# Patient Record
Sex: Female | Born: 1935
Health system: Southern US, Community
[De-identification: ages and names within clinical notes are randomized; demographics above are authoritative.]

## PROBLEM LIST (undated history)

## (undated) DIAGNOSIS — I519 Heart disease, unspecified: Secondary | ICD-10-CM

## (undated) DIAGNOSIS — C801 Malignant (primary) neoplasm, unspecified: Secondary | ICD-10-CM

## (undated) DIAGNOSIS — E785 Hyperlipidemia, unspecified: Secondary | ICD-10-CM

## (undated) DIAGNOSIS — J45909 Unspecified asthma, uncomplicated: Secondary | ICD-10-CM

## (undated) DIAGNOSIS — M353 Polymyalgia rheumatica: Secondary | ICD-10-CM

## (undated) DIAGNOSIS — Z9221 Personal history of antineoplastic chemotherapy: Secondary | ICD-10-CM

## (undated) DIAGNOSIS — C50919 Malignant neoplasm of unspecified site of unspecified female breast: Secondary | ICD-10-CM

## (undated) DIAGNOSIS — Z923 Personal history of irradiation: Secondary | ICD-10-CM

## (undated) DIAGNOSIS — I10 Essential (primary) hypertension: Secondary | ICD-10-CM

## (undated) DIAGNOSIS — K219 Gastro-esophageal reflux disease without esophagitis: Secondary | ICD-10-CM

## (undated) DIAGNOSIS — D649 Anemia, unspecified: Secondary | ICD-10-CM

## (undated) DIAGNOSIS — T7840XA Allergy, unspecified, initial encounter: Secondary | ICD-10-CM

## (undated) DIAGNOSIS — U099 Post covid-19 condition, unspecified: Secondary | ICD-10-CM

## (undated) HISTORY — DX: Post covid-19 condition, unspecified: U09.9

## (undated) HISTORY — DX: Gastro-esophageal reflux disease without esophagitis: K21.9

## (undated) HISTORY — DX: Malignant (primary) neoplasm, unspecified: C80.1

## (undated) HISTORY — DX: Allergy, unspecified, initial encounter: T78.40XA

## (undated) HISTORY — DX: Heart disease, unspecified: I51.9

## (undated) HISTORY — DX: Polymyalgia rheumatica: M35.3

## (undated) HISTORY — DX: Essential (primary) hypertension: I10

## (undated) HISTORY — DX: Anemia, unspecified: D64.9

## (undated) HISTORY — DX: Malignant neoplasm of unspecified site of unspecified female breast: C50.919

## (undated) HISTORY — PX: MASTECTOMY: SHX3

## (undated) HISTORY — DX: Unspecified asthma, uncomplicated: J45.909

## (undated) HISTORY — DX: Hyperlipidemia, unspecified: E78.5

---

## 1973-09-21 HISTORY — PX: ABDOMINAL HYSTERECTOMY: SHX81

## 1999-09-22 HISTORY — PX: RECTOCELE REPAIR: SHX761

## 1999-09-22 HISTORY — PX: CYSTOCELE REPAIR: SHX163

## 1999-10-16 ENCOUNTER — Encounter: Payer: Self-pay | Admitting: *Deleted

## 1999-10-20 ENCOUNTER — Inpatient Hospital Stay (HOSPITAL_COMMUNITY): Admission: RE | Admit: 1999-10-20 | Discharge: 1999-10-21 | Payer: Self-pay | Admitting: *Deleted

## 2000-02-09 ENCOUNTER — Encounter: Payer: Self-pay | Admitting: Family Medicine

## 2000-02-09 ENCOUNTER — Encounter: Admission: RE | Admit: 2000-02-09 | Discharge: 2000-02-09 | Payer: Self-pay | Admitting: Family Medicine

## 2000-07-05 ENCOUNTER — Other Ambulatory Visit: Admission: RE | Admit: 2000-07-05 | Discharge: 2000-07-05 | Payer: Self-pay | Admitting: *Deleted

## 2000-10-18 ENCOUNTER — Encounter: Payer: Self-pay | Admitting: Family Medicine

## 2000-10-18 ENCOUNTER — Other Ambulatory Visit: Admission: RE | Admit: 2000-10-18 | Discharge: 2000-10-18 | Payer: Self-pay | Admitting: Family Medicine

## 2000-10-18 ENCOUNTER — Encounter: Admission: RE | Admit: 2000-10-18 | Discharge: 2000-10-18 | Payer: Self-pay | Admitting: Family Medicine

## 2000-11-01 ENCOUNTER — Inpatient Hospital Stay (HOSPITAL_COMMUNITY): Admission: RE | Admit: 2000-11-01 | Discharge: 2000-11-05 | Payer: Self-pay | Admitting: General Surgery

## 2000-11-01 ENCOUNTER — Encounter (INDEPENDENT_AMBULATORY_CARE_PROVIDER_SITE_OTHER): Payer: Self-pay | Admitting: *Deleted

## 2000-11-01 ENCOUNTER — Encounter: Payer: Self-pay | Admitting: General Surgery

## 2000-11-01 DIAGNOSIS — C801 Malignant (primary) neoplasm, unspecified: Secondary | ICD-10-CM | POA: Insufficient documentation

## 2000-11-01 HISTORY — PX: BREAST SURGERY: SHX581

## 2000-11-01 HISTORY — DX: Malignant (primary) neoplasm, unspecified: C80.1

## 2000-11-18 ENCOUNTER — Encounter: Admission: RE | Admit: 2000-11-18 | Discharge: 2001-02-16 | Payer: Self-pay | Admitting: Radiation Oncology

## 2000-12-01 ENCOUNTER — Ambulatory Visit (HOSPITAL_COMMUNITY): Admission: RE | Admit: 2000-12-01 | Discharge: 2000-12-01 | Payer: Self-pay | Admitting: Oncology

## 2000-12-02 ENCOUNTER — Ambulatory Visit (HOSPITAL_BASED_OUTPATIENT_CLINIC_OR_DEPARTMENT_OTHER): Admission: RE | Admit: 2000-12-02 | Discharge: 2000-12-02 | Payer: Self-pay | Admitting: General Surgery

## 2000-12-02 ENCOUNTER — Encounter: Payer: Self-pay | Admitting: General Surgery

## 2001-02-25 ENCOUNTER — Ambulatory Visit: Admission: RE | Admit: 2001-02-25 | Discharge: 2001-04-20 | Payer: Self-pay | Admitting: *Deleted

## 2001-03-31 ENCOUNTER — Encounter: Admission: RE | Admit: 2001-03-31 | Discharge: 2001-03-31 | Payer: Self-pay | Admitting: *Deleted

## 2001-04-21 ENCOUNTER — Ambulatory Visit: Admission: RE | Admit: 2001-04-21 | Discharge: 2001-07-20 | Payer: Self-pay | Admitting: *Deleted

## 2001-04-25 ENCOUNTER — Ambulatory Visit (HOSPITAL_BASED_OUTPATIENT_CLINIC_OR_DEPARTMENT_OTHER): Admission: RE | Admit: 2001-04-25 | Discharge: 2001-04-25 | Payer: Self-pay | Admitting: General Surgery

## 2001-07-04 ENCOUNTER — Other Ambulatory Visit: Admission: RE | Admit: 2001-07-04 | Discharge: 2001-07-04 | Payer: Self-pay | Admitting: *Deleted

## 2001-09-05 ENCOUNTER — Other Ambulatory Visit: Admission: RE | Admit: 2001-09-05 | Discharge: 2001-09-05 | Payer: Self-pay | Admitting: *Deleted

## 2001-10-19 ENCOUNTER — Encounter: Admission: RE | Admit: 2001-10-19 | Discharge: 2001-10-19 | Payer: Self-pay | Admitting: Oncology

## 2001-10-19 ENCOUNTER — Encounter: Payer: Self-pay | Admitting: Oncology

## 2002-02-09 ENCOUNTER — Other Ambulatory Visit: Admission: RE | Admit: 2002-02-09 | Discharge: 2002-02-09 | Payer: Self-pay | Admitting: *Deleted

## 2002-10-20 ENCOUNTER — Encounter: Admission: RE | Admit: 2002-10-20 | Discharge: 2002-10-20 | Payer: Self-pay | Admitting: Oncology

## 2002-10-20 ENCOUNTER — Encounter: Payer: Self-pay | Admitting: Oncology

## 2003-02-13 ENCOUNTER — Other Ambulatory Visit: Admission: RE | Admit: 2003-02-13 | Discharge: 2003-02-13 | Payer: Self-pay | Admitting: *Deleted

## 2003-10-29 ENCOUNTER — Encounter: Admission: RE | Admit: 2003-10-29 | Discharge: 2003-10-29 | Payer: Self-pay | Admitting: Oncology

## 2004-02-14 ENCOUNTER — Other Ambulatory Visit: Admission: RE | Admit: 2004-02-14 | Discharge: 2004-02-14 | Payer: Self-pay | Admitting: *Deleted

## 2004-05-16 ENCOUNTER — Encounter: Admission: RE | Admit: 2004-05-16 | Discharge: 2004-05-16 | Payer: Self-pay | Admitting: Family Medicine

## 2004-09-29 ENCOUNTER — Ambulatory Visit: Payer: Self-pay | Admitting: Oncology

## 2004-10-29 ENCOUNTER — Encounter: Admission: RE | Admit: 2004-10-29 | Discharge: 2004-10-29 | Payer: Self-pay | Admitting: Oncology

## 2005-03-30 ENCOUNTER — Ambulatory Visit: Payer: Self-pay | Admitting: Oncology

## 2005-04-30 ENCOUNTER — Other Ambulatory Visit: Admission: RE | Admit: 2005-04-30 | Discharge: 2005-04-30 | Payer: Self-pay | Admitting: *Deleted

## 2005-09-28 ENCOUNTER — Ambulatory Visit: Payer: Self-pay | Admitting: Oncology

## 2005-09-30 ENCOUNTER — Ambulatory Visit (HOSPITAL_COMMUNITY): Admission: RE | Admit: 2005-09-30 | Discharge: 2005-09-30 | Payer: Self-pay | Admitting: Oncology

## 2005-10-09 ENCOUNTER — Ambulatory Visit: Payer: Self-pay | Admitting: Internal Medicine

## 2005-10-13 ENCOUNTER — Ambulatory Visit: Payer: Self-pay | Admitting: Internal Medicine

## 2005-10-30 ENCOUNTER — Encounter: Admission: RE | Admit: 2005-10-30 | Discharge: 2005-10-30 | Payer: Self-pay | Admitting: Oncology

## 2005-12-29 ENCOUNTER — Ambulatory Visit: Payer: Self-pay | Admitting: Oncology

## 2006-06-24 ENCOUNTER — Other Ambulatory Visit: Admission: RE | Admit: 2006-06-24 | Discharge: 2006-06-24 | Payer: Self-pay | Admitting: *Deleted

## 2006-06-24 ENCOUNTER — Ambulatory Visit: Payer: Self-pay | Admitting: Oncology

## 2006-07-22 ENCOUNTER — Encounter: Admission: RE | Admit: 2006-07-22 | Discharge: 2006-07-22 | Payer: Self-pay | Admitting: Oncology

## 2006-11-01 ENCOUNTER — Encounter: Admission: RE | Admit: 2006-11-01 | Discharge: 2006-11-01 | Payer: Self-pay | Admitting: Oncology

## 2006-12-23 ENCOUNTER — Ambulatory Visit: Payer: Self-pay | Admitting: Oncology

## 2007-07-05 ENCOUNTER — Ambulatory Visit: Payer: Self-pay | Admitting: Oncology

## 2007-07-08 LAB — URINALYSIS, MICROSCOPIC - CHCC
Ketones: NEGATIVE mg/dL
Leukocyte Esterase: NEGATIVE
Nitrite: NEGATIVE
Protein: NEGATIVE mg/dL
pH: 6 (ref 4.6–8.0)

## 2007-07-10 LAB — URINE CULTURE

## 2007-07-11 ENCOUNTER — Other Ambulatory Visit: Admission: RE | Admit: 2007-07-11 | Discharge: 2007-07-11 | Payer: Self-pay | Admitting: *Deleted

## 2007-11-10 ENCOUNTER — Encounter: Admission: RE | Admit: 2007-11-10 | Discharge: 2007-11-10 | Payer: Self-pay | Admitting: Oncology

## 2007-11-16 ENCOUNTER — Encounter: Admission: RE | Admit: 2007-11-16 | Discharge: 2007-11-16 | Payer: Self-pay | Admitting: Oncology

## 2008-01-31 ENCOUNTER — Ambulatory Visit: Payer: Self-pay | Admitting: Oncology

## 2008-07-23 ENCOUNTER — Encounter: Admission: RE | Admit: 2008-07-23 | Discharge: 2008-07-23 | Payer: Self-pay | Admitting: Oncology

## 2008-07-24 ENCOUNTER — Other Ambulatory Visit: Admission: RE | Admit: 2008-07-24 | Discharge: 2008-07-24 | Payer: Self-pay | Admitting: Family Medicine

## 2008-07-31 ENCOUNTER — Ambulatory Visit: Payer: Self-pay | Admitting: Oncology

## 2008-08-11 ENCOUNTER — Emergency Department (HOSPITAL_COMMUNITY): Admission: EM | Admit: 2008-08-11 | Discharge: 2008-08-11 | Payer: Self-pay | Admitting: Emergency Medicine

## 2008-11-16 ENCOUNTER — Encounter: Admission: RE | Admit: 2008-11-16 | Discharge: 2008-11-16 | Payer: Self-pay | Admitting: Oncology

## 2009-01-25 ENCOUNTER — Ambulatory Visit: Payer: Self-pay | Admitting: Oncology

## 2009-08-20 ENCOUNTER — Ambulatory Visit: Payer: Self-pay | Admitting: Oncology

## 2009-11-18 ENCOUNTER — Encounter: Admission: RE | Admit: 2009-11-18 | Discharge: 2009-11-18 | Payer: Self-pay | Admitting: Oncology

## 2009-12-05 ENCOUNTER — Telehealth: Payer: Self-pay | Admitting: Internal Medicine

## 2009-12-09 ENCOUNTER — Ambulatory Visit: Payer: Self-pay | Admitting: Gastroenterology

## 2009-12-09 DIAGNOSIS — R1032 Left lower quadrant pain: Secondary | ICD-10-CM | POA: Insufficient documentation

## 2009-12-09 DIAGNOSIS — E0590 Thyrotoxicosis, unspecified without thyrotoxic crisis or storm: Secondary | ICD-10-CM | POA: Insufficient documentation

## 2009-12-09 DIAGNOSIS — I10 Essential (primary) hypertension: Secondary | ICD-10-CM

## 2009-12-09 DIAGNOSIS — Z853 Personal history of malignant neoplasm of breast: Secondary | ICD-10-CM

## 2009-12-09 DIAGNOSIS — K5900 Constipation, unspecified: Secondary | ICD-10-CM

## 2009-12-09 DIAGNOSIS — R1031 Right lower quadrant pain: Secondary | ICD-10-CM | POA: Insufficient documentation

## 2009-12-11 ENCOUNTER — Ambulatory Visit: Payer: Self-pay | Admitting: Internal Medicine

## 2009-12-31 ENCOUNTER — Ambulatory Visit: Payer: Self-pay | Admitting: Internal Medicine

## 2009-12-31 DIAGNOSIS — R1084 Generalized abdominal pain: Secondary | ICD-10-CM | POA: Insufficient documentation

## 2010-07-29 ENCOUNTER — Ambulatory Visit: Payer: Self-pay | Admitting: Oncology

## 2010-10-11 ENCOUNTER — Encounter: Payer: Self-pay | Admitting: Oncology

## 2010-10-21 NOTE — Procedures (Signed)
Summary: Colonoscopy   Colonoscopy  Procedure date:  10/13/2005  Findings:      Location:  [facility].  Results: Hemorrhoids.      Patient Name: Jodi Valdez, Jodi Valdez MRN:  Procedure Procedures: Colonoscopy CPT: 16109.  Personnel: Endoscopist: Wilhemina Bonito. Marina Goodell, MD.  Referred By: Mardelle Matte, MD.  Exam Location: Exam performed in Outpatient Clinic. Outpatient  Patient Consent: Procedure, Alternatives, Risks and Benefits discussed, consent obtained, from patient. Consent was obtained by the RN.  Indications Symptoms: Constipation Abdominal pain / bloating.  Increased Risk Screening: Personal history of breast cancer.  History  Current Medications: Patient is not currently taking Coumadin.  Pre-Exam Physical: Performed Oct 13, 2005. Cardio-pulmonary exam, Rectal exam, Abdominal exam, Mental status exam WNL.  Comments: Pt. history reviewed/updated, physical exam performed prior to initiation of sedation? yes Exam Exam: Extent of exam reached: Cecum, extent intended: Cecum.  The cecum was identified by appendiceal orifice and IC valve. Patient position: on left side. The Cecum was reached at 3:43 PM. ended at 3:54 PM. Time for Withdrawl: 00:11. Colon retroflexion performed. Images taken. ASA Classification: II. Tolerance: excellent.  Monitoring: Pulse and BP monitoring, Oximetry used. Supplemental O2 given.  Colon Prep Used osmo prep for colon prep. Prep results: excellent.  Sedation Meds: Patient assessed and found to be appropriate for moderate (conscious) sedation. Fentanyl 75 mcg. given IV. Versed 7 mg. given IV.  Findings NORMAL EXAM: Cecum to Rectum.   Assessment  Diagnoses: 455.0: Hemorrhoids, Internal.   Comments: NO POLYPS SEEN Events  Unplanned Interventions: No intervention was required.  Unplanned Events: There were no complications. Plans Disposition: After procedure patient sent to recovery. After recovery patient  sent home.  Comments: RETURN TO THE CARE OF DRS.SHERRILL AND KARAM  This report was created from the original endoscopy report, which was reviewed and signed by the above listed endoscopist.   cc:  Maurie Boettcher, MD      Kathrynn Running, MD      The Patient

## 2010-10-21 NOTE — Assessment & Plan Note (Signed)
Summary: FOLLOWUP abdominal pain ( saw PA)   History of Present Illness Visit Type: Follow-up Visit Primary GI MD: Yancey Flemings MD Primary Provider: Kathrynn Running MD Chief Complaint: abdominal pain has improved, patient saw Amy last visit; patient states that her tongue is coated and mouth is very dry. History of Present Illness:   75 year old female with hypertension, hypothyroidism, hyperlipidemia, and breast cancer status post mastectomy followed by chemoradiation therapy, and TRAM flap reconstruction in 2002. She presents today regarding followup for lower abdominal pain. The patient was seen March 21 for lower abdominal pain of 3 weeks' duration. She was treated empirically for diverticulitis. However, prior colonoscopy in 2007 was normal with no evidence of diverticular disease. She did not improve with antibiotic therapy. Antispasmodics were not particularly helpful. Thus, she underwent a CT scan of the abdomen and pelvis on March 23. This was essentially unremarkable. Stool in the colon noted. She did take a purgative for her gout and was feeling somewhat better. Her problem is certainly no worse. She states that the dull lower abdominal aching is better after meals and after defecation. Her weight is stable. No pain at night when she sleeps. She points along the incision  line of her TRAM flap instruction. She is concerned over serious problem such as cancer.   GI Review of Systems    Reports abdominal pain.     Location of  Abdominal pain: lower abdomen.    Denies acid reflux, belching, bloating, chest pain, dysphagia with liquids, dysphagia with solids, heartburn, loss of appetite, nausea, vomiting, vomiting blood, weight loss, and  weight gain.      Reports constipation.     Denies anal fissure, black tarry stools, change in bowel habit, diarrhea, diverticulosis, fecal incontinence, heme positive stool, hemorrhoids, irritable bowel syndrome, jaundice, light color stool, liver problems,  rectal bleeding, and  rectal pain.    Current Medications (verified): 1)  Levothroid 75 Mcg Tabs (Levothyroxine Sodium) .Marland Kitchen.. 1 By Mouth Once Daily 2)  Benazepril Hcl 20 Mg Tabs (Benazepril Hcl) .Marland Kitchen.. 1 By Mouth Once Daily 3)  Crestor 20 Mg Tabs (Rosuvastatin Calcium) .Marland Kitchen.. 1 By Mouth Once Daily 4)  Amitriptyline Hcl 10 Mg Tabs (Amitriptyline Hcl) .... As Needed 5)  Fish Oil 1000 Mg Caps (Omega-3 Fatty Acids) .Marland Kitchen.. 1 By Mouth Two Times A Day 6)  Calcarb 600 1500 Mg Tabs (Calcium Carbonate) .Marland Kitchen.. 1 By Mouth Two Times A Day 7)  Preservision/lutein  Caps (Multiple Vitamins-Minerals) .Marland Kitchen.. 1 By Mouth Two Times A Day 8)  Vitamin D 1000 Unit  Tabs (Cholecalciferol) .Marland Kitchen.. 1 By Mouth Once Daily  Allergies: 1)  ! Codeine  Past History:  Past Medical History: Reviewed history from 12/09/2009 and no changes required. Breast Cancer2002-S/P MASTECTOMY,CHEMO/RADIATION,TRAM FLAP 2002 Hypertension Urinary Tract Infection  Past Surgical History: Reviewed history from 12/09/2009 and no changes required. Hysterectomy cystocele rectocele Breast-Mastectomy, TRAM FLAP 2002  Family History: Reviewed history from 12/09/2009 and no changes required. No FH of Colon Cancer: Family History of Heart Disease: father  Social History: Reviewed history from 12/09/2009 and no changes required. Patient has never smoked.  Alcohol Use - no Daily Caffeine Use 2 per day Illicit Drug Use - no Patient does not get regular exercise.   Review of Systems       The patient complains of back pain and thirst - excessive.  The patient denies allergy/sinus, anemia, anxiety-new, arthritis/joint pain, blood in urine, breast changes/lumps, change in vision, confusion, cough, coughing up blood, depression-new, fainting, fatigue,  fever, headaches-new, hearing problems, heart murmur, heart rhythm changes, itching, menstrual pain, muscle pains/cramps, night sweats, nosebleeds, pregnancy symptoms, shortness of breath, skin rash,  sleeping problems, sore throat, swelling of feet/legs, swollen lymph glands, thirst - excessive , urination - excessive , urination changes/pain, urine leakage, vision changes, and voice change.    Vital Signs:  Patient profile:   75 year old female Height:      64 inches Weight:      134.50 pounds BMI:     23.17 Pulse rate:   92 / minute Pulse rhythm:   regular BP sitting:   122 / 76  (right arm) Cuff size:   regular  Vitals Entered By: June McMurray CMA Duncan Dull) (December 31, 2009 1:28 PM)  Physical Exam  General:  Well developed, well nourished, no acute distress. Head:  Normocephalic and atraumatic. Eyes:  PERRLA, no icterus. Mouth:  No deformity or lesions Lungs:  Clear throughout to auscultation. Heart:  Regular rate and rhythm; no murmurs, rubs,  or bruits. Abdomen:  Soft, nontender and nondistended. No masses, hepatosplenomegaly or hernias noted. Normal bowel sounds.. They tenderness to palpation along the transverse incision in the lower abdomen Msk:  no deformities Pulses:  Normal pulses noted. Extremities:  no edema Neurologic:  Alert and  oriented x4;  Skin:  no jaundice Psych:  Alert and cooperative. Normal mood and affect.   Impression & Recommendations:  Problem # 1:  ABDOMINAL PAIN -GENERALIZED (ICD-789.07) 3 month history of lower abdominal discomfort as described. Negative colonoscopy in 2007 and negative CT scan last month. Only relevant GI symptom is relative constipation. I suspect that her pain is musculoskeletal and related to adhesions from her prior surgery.  Plan: #1. reassurance #2. Tylenol p.r.n. pain #3. GI followup p.r.n.  Problem # 2:  CONSTIPATION (ICD-564.00) it's possible that constipation may be contributing to her discomfort.  Plan: #1. Prescribe MiraLax 17 g in 8 ounces of water daily. Titrate to need to achieve one to 2 bowel movements daily.  Patient Instructions: 1)  Glycolax 17 grams in 8 oz of water daily. You can buy this over the  counter.   2)  Written instructions given to you with discount coupon. 3)  Take Tylenol as needed pain. 4)  The medication list was reviewed and reconciled.  All changed / newly prescribed medications were explained.  A complete medication list was provided to the patient / caregiver. 5)  Copy: Dr. Kathrynn Running

## 2010-10-21 NOTE — Assessment & Plan Note (Signed)
Summary: abd. pain-Dr.Perry pt   History of Present Illness Visit Type: Initial Visit Primary GI MD: Yancey Flemings MD Primary Provider: Kathrynn Running MD Chief Complaint: abd pain History of Present Illness:   PLEASANT 75 YO FEMALE KNOWN TO DR.PERRY FROM COLONOSCOPYIN 2007. THIS WAS NORMAL EXCEPT FOR INTERNAL HEMORRHOIDS.SHE COMES IN TODAY WITH NEW C/O LOWER ABDOMINAL ACHING PAIN X 3 WEEKS. SHE SAYS HER SXS STARTED AFTER SHE ATE ALOT OF POPCORN,THEN DEVELOPED SOME CONSTIPATION. SINCE THAT TIME SHE HAS BEEN HAVING BMS DAILY BUT FEELS PRESSURE LIKE SHE NEEDS TO EVACUATE BOWELS. SHE HAS HAD MUCOUS IN STOOL, NO BLOOD. NO FEVER. NO N/V,APPETITE GOOD,SOMETIMES FEELS WORSE AFTER EATING -NOT ALWAYS.SHE WAS SEEN BY HER PRIMARY MD AND THOUGHT MAY HAVE DIVERTICULITIS-SHE HAS TAKEN A COURSE OF CIPRO AND FLAGYL BUT HASNT NOTICED ANY REAL IMPROVEMENT IN SXS. SHE IS ALSO ON LIBRAX WHICH HAS HELPED A LITTLE. NO URINARY SXS.  SHE HAS A HX OF BREAST CANCER-S/P MASTECTOMY THEN CHEMO/RADIATION IN 2002. LABS,3/8 WITH CBC,CMET UNREMARKABLE.   GI Review of Systems    Reports abdominal pain and  acid reflux.     Location of  Abdominal pain: lower abdomen.    Denies belching, bloating, chest pain, dysphagia with liquids, dysphagia with solids, heartburn, loss of appetite, nausea, vomiting, vomiting blood, and  weight loss.      Reports change in bowel habits and  constipation.     Denies anal fissure, black tarry stools, diarrhea, diverticulosis, fecal incontinence, heme positive stool, hemorrhoids, irritable bowel syndrome, jaundice, light color stool, liver problems, rectal bleeding, and  rectal pain.    Current Medications (verified): 1)  Metronidazole 500 Mg Tabs (Metronidazole) .Marland Kitchen.. 1 By Mouth Two Times A Day 2)  Cipro 500 Mg Tabs (Ciprofloxacin Hcl) .Marland Kitchen.. 1 By Mouth Two Times A Day X 10 Days 3)  Librax 2.5-5 Mg Caps (Clidinium-Chlordiazepoxide) .Marland Kitchen.. 1 By Mouth Four Times A Day As Needed 4)  Levothroid 75 Mcg Tabs  (Levothyroxine Sodium) .Marland Kitchen.. 1 By Mouth Once Daily 5)  Benazepril Hcl 20 Mg Tabs (Benazepril Hcl) .Marland Kitchen.. 1 By Mouth Once Daily 6)  Crestor 20 Mg Tabs (Rosuvastatin Calcium) .Marland Kitchen.. 1 By Mouth Once Daily 7)  Amitriptyline Hcl 10 Mg Tabs (Amitriptyline Hcl) .... As Needed 8)  Fish Oil 1000 Mg Caps (Omega-3 Fatty Acids) .Marland Kitchen.. 1 By Mouth Two Times A Day 9)  Calcarb 600 1500 Mg Tabs (Calcium Carbonate) .Marland Kitchen.. 1 By Mouth Two Times A Day 10)  Preservision/lutein  Caps (Multiple Vitamins-Minerals) .Marland Kitchen.. 1 By Mouth Two Times A Day 11)  Vitamin D 1000 Unit  Tabs (Cholecalciferol) .Marland Kitchen.. 1 By Mouth Once Daily  Allergies (verified): 1)  ! * Codien  Past History:  Past Medical History: Breast Cancer2002-S/P MASTECTOMY,CHEMO/RADIATION,TRAM FLAP 2002 Hypertension Urinary Tract Infection  Past Surgical History: Hysterectomy cystocele rectocele Breast-Mastectomy, TRAM FLAP 2002  Family History: No FH of Colon Cancer: Family History of Heart Disease: father  Social History: Patient has never smoked.  Alcohol Use - no Daily Caffeine Use 2 per day Illicit Drug Use - no Patient does not get regular exercise.  Smoking Status:  never Drug Use:  no Does Patient Exercise:  no  Review of Systems  The patient denies allergy/sinus, anemia, anxiety-new, arthritis/joint pain, back pain, blood in urine, breast changes/lumps, change in vision, confusion, cough, coughing up blood, depression-new, fainting, fatigue, fever, headaches-new, hearing problems, heart murmur, heart rhythm changes, itching, menstrual pain, muscle pains/cramps, night sweats, nosebleeds, pregnancy symptoms, shortness of breath, skin rash, sleeping problems, sore  throat, swelling of feet/legs, swollen lymph glands, thirst - excessive , urination - excessive , urination changes/pain, urine leakage, vision changes, and voice change.         ROS OTHERWISE AS IN HPI  Vital Signs:  Patient profile:   75 year old female Height:      64  inches Weight:      139 pounds BMI:     23.95 Pulse rate:   98 / minute Pulse rhythm:   regular BP sitting:   142 / 62  (left arm)  Vitals Entered By: Chales Abrahams CMA Duncan Dull) (December 09, 2009 10:04 AM)  Physical Exam  General:  Well developed, well nourished, no acute distress. Head:  Normocephalic and atraumatic. Eyes:  PERRLA, no icterus. Lungs:  Clear throughout to auscultation. Heart:  Regular rate and rhythm; no murmurs, rubs,  or bruits. Abdomen:  SOFT, MILD TENDERNESS ACROSS LOWER ABDOMEN, NO PALP MASS OR HSM,BS+,TRANSVERSE INCISIONAL SCAR. Rectal:  HEME NEGATIVE Extremities:  No clubbing, cyanosis, edema or deformities noted. Neurologic:  Alert and  oriented x4;  grossly normal neurologically. Psych:  Alert and cooperative. Normal mood and affect.   Impression & Recommendations:  Problem # 1:  ABDOMINAL PAIN, LEFT LOWER QUADRANT (ICD-789.04) Assessment New 75 YO FEMALE WITH 3 WEEK HX OF CONSATNT DULL ACHY ABDOMINAL PAIN,LOWER ABDOMEN-UNRESPONSIVE TO EMPIRIC COURSE OF ANTIBIOTICS AND NO DIVERTICULOSIS NOTED ON COLONOSCOPY 2007. ETIOLOGY UNCLEAR.  CONTINUE LIBRAX AS NEEDED ADD ALIGN ONE DAILY X 2 MONTHS SCHEDULE FOR CT SCAN ABDOMEN AND PELVIS. FOLLOW UP WITH DR. PERRY IN 2 WEEKS.FURTHER PLANS PENDING RESULTS OF CT. Orders: CT Abdomen/Pelvis with Contrast (CT Abd/Pelvis w/con)  Problem # 2:  HYPERTENSION (ICD-401.9) Assessment: Comment Only  Problem # 3:  PERSONAL HX BREAST CANCER (ICD-V10.3) Assessment: Comment Only  Patient Instructions: 1)  Continue the Librax as needed. 2)  Start Align, 1 capsule daily. You can get this at any pharmacy, [facility], [employer].  Coupon provided. 3)  We sceduled the CT at [facility] [place] [address] [place]. 4)  Russella Dar laws explained regarding location of CT. 5)  Contrast and directions provided.  6)  Copy sent to : Kathrynn Running, MD 7)                         Rolm Baptise, MD 8)  Made you an appointment with Dr. Marina Goodell for 12-31-09 at 1:30  PM.  9)  The medication list was reviewed and reconciled.  All changed / newly prescribed medications were explained.  A complete medication list was provided to the patient / caregiver.

## 2010-10-21 NOTE — Consult Note (Signed)
Summary: GI Consult/[facility]  GI Consult/ HealthCare   Imported By: Sherian Rein 01/08/2010 11:31:45  _____________________________________________________________________  External Attachment:    Type:   Image     Comment:   External Document

## 2010-10-21 NOTE — Progress Notes (Signed)
Summary: triage   Phone Note Call from Patient Call back at Home Phone [phone]   Caller: Patient Call For: Marina Goodell Reason for Call: Talk to Nurse Summary of Call: Patient wants to be seen sooner than her appt date 4-14 do to a lot of lower abd pain and reflux. Initial call taken by: Tawni Levy,  December 05, 2009 11:08 AM  Follow-up for Phone Call        Given appt. with PA for 12/09/2009.She will have pcp-Phillip Karem fax records to [facility]. Follow-up by: Teryl Lucy RN,  December 05, 2009 11:19 AM

## 2011-02-06 NOTE — Op Note (Signed)
[place]  Patient:    Jodi Valdez, Jodi Valdez                  MRN: [id number] Proc. Date: 11/01/00 Attending:  Rose Phi. Maple Hudson, M.D.                           Operative Report  PREOPERATIVE DIAGNOSIS:  Lobular carcinoma of the left breast.  POSTOPERATIVE DIAGNOSIS:  Lobular carcinoma of the left breast.  Lymph node metastasis.  OPERATION PERFORMED: 1. Blue dye injection. 2. Left axillary sentinel lymph node biopsy. 3. Left total mastectomy. 4. Left axillary lymph node dissection.  SURGEON:  Rose Phi. Maple Hudson, M.D.  ASSISTANT:  Anselm Pancoast. Zachery Dakins, M.D.  ANESTHESIA:  General.  DESCRIPTION OF PROCEDURE:  Prior to coming to the operating room the patient had 1 mCi of technetium sulfur colloid injected intradermally around the periareolar tissue.  After suitable general anesthesia was induced, the patient was placed in the supine position with both arms extended.  5 cc of Lymphazurin blue was then injected in the subareolar tissue and the breast massaged for five minutes.  We then prepped her from the neck to the pubis and then from table to table for her mastectomy plus reconstruction.  After draping her with careful scanning with the Neoprobe revealed no hot spots in the supraclavicular internal mammary area, but a good hot spot in the left axilla.  A short transverse incision was made in the left axilla with dissection through the subcutaneous tissues to expose the clavipectoral fascia.  We could see a faint hint of blue just deep to that and we incised the fascia and then, using the Neoprobe, identified a very hot and blue lymph node and in addition to it, another hot and blue lymph node.  We excised these and submitted them as sentinel nodes.  There were no other palpable nodes or blue nodes or hot nodes.  While that was being done, an elliptical incorporating the nipple areolar complex and an old biopsy scar was then outlined in order  to do a skin sparing mastectomy.  We then dissected the flaps medially to the sternum and superiorly to the clavicle and laterally to the latissimus dorsi muscle and inferiorly to the inframammary fold at the rectus fascia.  The breast was then removed by dissecting from medial to lateral.  As we dissected along the lateral border of the pectoralis major muscle the sentinel node was reported as positive.  I then incised along the pectoralis major muscle and retracted it and then divided the clavipectoral fascia at the axillary vein which we clearly identified.  By incising along the pectoralis minor we were then able to take the level 1 and level 2 nodes by sweeping all the tissue deep to the minor and inferior to the vein out with the long thoracic and thoracodorsal nerves being identified and preserved and other vessels and nerves being clipped and divided.  Following removal of the specimen, we thoroughly irrigated the field with saline. We had good hemostasis.  Dr. Odis Luster was then going to do a TRAM reconstruction which will be recorded in  separate noted. DD:  11/01/00 TD:  11/01/00 Job: [id number]

## 2011-02-06 NOTE — Op Note (Signed)
[place]. [facility]  Patient:    Jodi Valdez, Jodi Valdez                MRN: [id number] Proc. Date: 11/01/00 Adm. Date:  [id number] Attending:  Janalyn Rouse                           Operative Report  PREOPERATIVE DIAGNOSIS:  Left breast cancer and acquired absence of the left breast.  POSTOPERATIVE DIAGNOSIS:  Left breast cancer and acquired absence of the left breast.  PROCEDURE:  Left breast reconstruction using a contralateral based transverse rectus abdominis myocutaneous flap.  SURGEON:  Teena Irani. Odis Luster, M.D.  ASSISTANT:  Dr. Dan Humphreys  ANESTHESIA:  General.  ESTIMATED BLOOD LOSS:  150 cc  DRAINS:  4 Blakes, 2 abdomen, 2 chest.  CLINICAL NOTE:  A 75 year old woman with the diagnoses of lobular carcinoma invasive.  Mastectomy was recommended.  She was also to undergo a sentinel lymph node biopsy.  She was very interested in breast reconstruction and was seen for counseling.  After a fairly extensive counseling she elected to use her own tissue and have tram flap.  She had a midline abdominal scar infraumbilical from hysterectomy many many years ago which meant that only half of the abdominal tissue could be utilized.  She understood that this might leave her just a little bit smaller than her opposite breast.  She was very accepting of this.  It was not felt that a double muscle flap was warranted due to the fairly significant untoward effects it can have on overall abdominal strength.  Next, the procedures, and risks were discussed with her extensively.  It was also felt that she had a little bit more adipose on the left-hand side of the abdomen than the right, and a contralateral muscle flap was planned for that reason.  DESCRIPTION OF PROCEDURE:  The patient was already in the operating room and had undergone sentinel lymph node biopsy and had undergone a modified radical mastectomy.  She had tolerated this well.  The incision was  made around the umbilicus and the dissection carried through the subcutaneous tissue leaving a fairly generous stalk on the umbilicus in order to preserve blood supply to it.  The upper limb of the planned elliptical incision was then made and beveling in a cephalad direction in order to capture perforators, the dissection was carried down to the underlying rectus fascia.  The underlying rectus fascia and muscles were protected and the dissection continued in a cephalad direction connecting through-and-through the mastectomy pocket.  All of this directly on top of the rectus fascia again taking great care to avoid damage to the underlying rectus fascia.  The patient was then placed in a slight sitting position just to make sure that the lower limb of the planned incision could be utilized and indeed this was the case.  She was then returned supine and the lower limb of the incision was then made again, taking the dissection down through the subcutaneous tissue using electrocautery.  Careful inspection was then made of the two sides of the abdomen and indeed the right side seemed to have more generous fat pad and this would be a contralateral flap, that being the case, and it was elected to use the right side.  The flap was then elevated off the left side, on top of the fascia over to the midline using electrocautery.  From the right side it was  elevated up to the lateral row of perforators.  Parallel incisions were then made in the anterior rectus fascia leaving a 2 cm wide strip anteriorly on the rectus muscle.  The rectus muscle was then gently dissected free from its fascial attachments using the bipolar and using a scalpel in areas of the tendinous inscriptions.  Great care was taken to avoid any damage to the underlying intra-abdominal contents as the muscle was mobilized and great care taken to avoid damage to the underlying superior epigastric pedicle.  Distally the deep inferior  perigastric vessels were identified and this having been done the rectus muscle was mobilized again taking great care to avoid any damage to underlying intra-abdominal contents.  Rectus muscle was then divided distally using electrocautery.  The flap was found to be demarcated and at this point lateral to the midline scar and this was then amputated at the midline scar.  This flap would consist only of zone 1 and zone 2.  Attention was then directed to the left chest.  Thorough irrigation with saline and meticulous hemostasis with the electrocautery.  Blake drains were positioned, brought through separate stab wounds laterally and inferiorly and secured with 3-0 Prolene sutures.  One blake drain was placed at the axillae and the other in the mastectomy defect.  Attention then returned to the abdomen.  The rectus muscle looked very healthy.  The deep inferior gastric vessels identified and the artery mobilized first and then triple ligated proximally and double ligated distally.  Veins were then double ligated and divided as well.  The muscle flap was then gently elevated and again was passed through the subcutaneous tunnel that had been previously created onto the left chest where it was positioned and temporarily secured with skin staples without any tension whatsoever.  Again, the flap continued to have excellent color and capillary refill with bright red bleeding at the periphery.  The flap was covered with dry sterile towels.  Attention directed back to the abdomen.  Thorough irrigation with saline, meticulous hemostasis with the electrocautery.  The abdominal closure with 0 Prolene interrupted figure-of-eight sutures taking great care to avoid any damage to any underlying intra-abdominal contents.  The area was then thoroughly irrigated with saline and excellent hemostasis having been confirmed [place] mesh was then positioned ______ through the appropriate point in the mesh through  a opening in the mesh and then the mesh secured with a couple of 0 Prolene horizontal mattress sutures well out of the periphery on each side and then a 2-0 Prolene simple running suture around the edge of the  mesh.  Again, excellent hemostasis having been confirmed, Blake drains were positioned, one on each side, brought out through separate stab wounds inferiorly and secured with 3-0 Prolene sutures.  The patient was then placed in a semi-Fowlers position but was rolled back into Trendelenburg in order to keep her in an appropriate position from an anesthesia standpoint.  The abdominal closure with 2-0 Vicryl interrupted inverted deep sutures, occasional 3-0 Vicryl interrupted inverted deep sutures, the abdominal wall appeared to have very good color and capillary refill at 2-3 seconds.  The umbilicus was then brought up to the appropriate level as close to the midline as possible without placing the umbilicus on too much tension.  The umbilicus had excellent color and excellent capillary refill.  Umbilical insetting 3-0 Vicryl interrupted inverted deep sutures and 5-0 nylon simple interrupted sutures.  Attention was then directed back to the chest.  Again, good hemostasis was  noted.  The very tip of Zone 2 was amputated and there was nice bright red bleeding from that area.  The flap was then marked for de-epithelialization. It was inset along its inferior border using 3-0 Vicryl interrupted inverted deep sutures.  The area for de-epithelialization was de-epithelialized and stitching sutures then placed superiorly and medially using horizontal mattress sutures taking great care to avoid any tension on the flap.  Again, the flap had excellent color and there was bright red bleeding from those areas where it had been de-epithelialized consistent with viability. The remainder of the insetting with 3-0 Vicryl interrupted inverted deep sutures, running 3-0 Vicryl subcuticular  suture.  The axillary incision for sentinel lymph node biopsy was also closed with 3-0 Vicryl interrupted inverted deep sutures and 3-0 Vicryl running subcuticular suture.  Steri-Strips, dry sterile dressings were lightly applied.  Drains placed on suction and she was transported to the recovery room in stable condition having tolerated the procedure well. DD:  11/01/00 TD:  11/02/00 Job: [id number]

## 2011-02-06 NOTE — Consult Note (Signed)
[place]. [facility]  Patient:    Jodi Valdez, Jodi Valdez                MRN: [id number] Proc. Date: 11/05/00 Adm. Date:  [id number] Attending:  Janalyn Rouse CC:         Maricela Bo, M.D. - [address] [place]  Almedia Balls. Randell Patient, M.D.  Rose Phi. Maple Hudson, M.D.  Leighton Roach. Truett Perna, M.D. - Cancer Center   Consultation Report  CONSULTING PHYSICIAN:  Leighton Roach. Truett Perna, M.D.  REASON FOR CONSULTATION:  New diagnoses of breast cancer.  REFERRING PHYSICIAN:  Dr. Francina Ames.  HISTORY OF PRESENT ILLNESS:  Jodi Valdez is a 75 year old female who was found to have a asymmetric density in the upper outer quadrant of the left breast on mammogram in January 2002.  She reports the onset of some vague left breast pain in October 2001, and reports that at times she was able to feel an area of abnormality which corresponded with findings on the recent mammogram.  A mammogram in May of 2001, was negative.  On October 18, 2000, Jodi Valdez underwent an ultrasound guided core needle biopsy of the left breast abnormality with findings of invasive lobular carcinoma ([id number]).  On November 01, 2000, the patient underwent a left total mastectomy, sentinel node biopsy and a left axillary lymph node dissection and left breast reconstruction.  PAST MEDICAL HISTORY: 1. Hypothyroidism. 2. Hypertension. 3. Cystocele, rectocele January of 2001.  CURRENT MEDICATIONS: 1. Colace 100 mg b.i.d. 2. Synthroid 100 mcg q. day. 3. Lotensin 20 mg q. day. 4. Ancef 1 gram IV q.8h.  HOME MEDICATIONS: 1. Lotensin 20 mg q. day. 2. ______ 100 mcg q. day.  ALLERGIES:  CODEINE.  FAMILY HISTORY:  Mother deceased at age 86 with a DVT and PE; father deceased age 29 with "heart problems".  The patient reports that she had a maternal aunt with a history of ovarian cancer.  Jodi Valdez has 2 brothers and 3 sisters who are all living and all reported to be healthy with no history  of cancer.  GYN HISTORY:  Menopause at the age 58; menarche age 56; first pregnancy at age 52.  Hormone replacement therapy from 1992 to 1999.  SOCIAL HISTORY:  Jodi Valdez lives in [place] with her husband. They have 2 sons, ages 76 and 59 who are both healthy.  She has previously been employed in [employer].  She denies any history of tobacco or ETOH use.  REVIEW OF SYSTEMS:  The patient denies any weight loss or anorexia.  She does report "hot flashes".  She denies any fever.  She denies fatigue.  She denies any pain.  She has had no unusual headaches or vision changes.  She denies any hearing deficits.  She has had no mouth sores or neck pain.  She denies any shortness of breath or cough. She has had no chest pain or peripheral edema. She denies any recent change in her bowel habits and has had no rectal bleeding or melena.  She denies any nausea or vomiting.  She has had no hematuria or dysuria.  She denies any back pain.  PHYSICAL EXAMINATION:  VITAL SIGNS:  Temperature 99.1, heart rate 101.0, respirations 18, blood pressure 193/107.  GENERAL:  Pleasant, Caucasian female in no acute distress.  HEENT:  Normocephalic and atraumatic.  Pupils, equal, round, reactive to light.  Extraocular movements are intact; sclerae anicteric.  Oropharynx is clear.  LYMPH:  No cervical, supraclavicular, right axillary or  inguinal lymph nodes palpable.  (Left axillae with a gauze dressing).  LUNGS:  Clear bilaterally.  CARDIOVASCULAR:  Regular rate and rhythm.  ABDOMEN:  Soft.  Gauze dressing across the lower quadrants of the abdomen.  EXTREMITIES:  No edema.  NEUROLOGIC:  Alert and oriented x 3.  Follows commands.  Moves all extremities to command.  Motor strength is 5/5.  LABORATORY DATA:  November 02, 2000, hemoglobin 10.1, white count 14.7, platelets 247,000.  Sodium 134, potassium 3.9, BUN 14, creatinine 0.6, glucose 117, calcium 8.3.  Preoperative labs on October 29, 2000:  Hemoglobin 13.2, white count 8.7, platelets 312,000.  Sodium 141, potassium 4.5, BUN 15, creatinine 0.7, glucose 109, total bilirubin 0.5, alk. phos. 65, SGOT 23, SGPT 23, total protein 6.8, albumin 3.7, and a calcium of 10.1.  FINAL PATHOLOGY REPORT:  ([id number]) Sentinel lymph nodes with 1 of 2 lymph nodes positive for metastatic lobular carcinoma but tumor through capsule into perinodal fat.  Left breast mastectomy with invasive ______morphic lobular carcinoma, 5.5 cm, associated with in situ carcinoma.  Margins negative but invasive carcinoma 0.4 cm from the nearest inked deep resection margin.  Left axillary lymph nodes with 5 lymph nodes negative for tumor.  Chest x-ray on November 01, 2000:  No active disease.  IMPRESSION AND PLAN:  [name] is a 75 year old female with newly diagnosed T3N1 (3a) invasive lobular carcinoma of the breast.  ER/PR and her2-neu studies are pending at the time of this dictation.  [name] has a significant change for systemic relapse.  We will recommend chemotherapy and then tamoxifen pending the hormone receptor studies.  We will likely recommend CAF chemotherapy versus NSABP-B-30 clinical trials (ACT).  She has a large tumor with a closed deep margin.  She may also be a candidate for chest wall radiation since there is also a risk for local recurrence.  We will plan for a radiation oncology evaluation as an outpatient.  We will follow up with her in the office the week of November 14, 2000, to make a treatment plan.  She will also benefit from a Port-A-Cath and Dr. Truett Perna will discuss this with Dr. Maple Hudson.  Dr. Truett Perna discussed the above with [name] and her husband.  The patient was seen and examined by Dr. Truett Perna. DD:  11/05/00 TD:  11/05/00  Job: [id number]

## 2011-02-06 NOTE — Discharge Summary (Signed)
[place]. [facility]  Patient:    Jodi Valdez, Jodi Valdez                MRN: [id number] Adm. Date:  [id number] Disc. Date: 11/05/00 Attending:  Janalyn Rouse                           Discharge Summary  FINAL DIAGNOSES: 1. Left breast cancer. 2. Acquired absence of left breast. 3. Hypertension.  PROCEDURE PERFORMED:  Left modified radical mastectomy with sentinel lymph node biopsy and a left breast reconstruction using a contralateral transverse rectus abdominis myocutaneous flap; all on November 01, 2000.  SUMMARY OF HISTORY AND PHYSICAL:  A 75 year old woman who had a diagnosis of left breast carcinoma which was a fairly diffuse area of invasive globular and she desired breast reconstruction.  A mastectomy had been recommended and after counseled regarding reconstruction, she elected to use her own tissue with a TRAM flap.  This was discussed with her in detail and she wished to proceed.  HOSPITAL COURSE:  On admission she was taken to surgery at which time a left modified radical mastectomy was performed.  She tolerated that procedure well. She then had immediate breast reconstruction using a TRAM flap based on the contralateral side.  She tolerated the procedure well.  Postoperatively, she did well.  She was afebrile. Flap and abdominal closure looked very healthy. Drains functioning well.  Excellent urine output. Preoperative hemoglobin 13.2, dropped to 10.1 postoperatively.  Other labs were within normal limits.  She was tolerating clear liquids which, on the third postoperative day, were advanced to a regular diet.  The final pathology has not returned yet, although she did have a positive lymph node on the sentinel node biopsy and she is being seen by Jillyn Hidden B. Truett Perna, M.D., here in the hospital.  He will return tomorrow for some further counseling with her.  DISPOSITION:  It is felt that she will be ready to be discharged tomorrow. She  will be discharged on a regular diet.  No lifting, no vigorous activity, no raising her left arm.  She will empty the drain three times a day and record the amount.  She will be discharged on Demerol 50 mg, a total of 30 given, one p.o. q.6h. p.r.n. for pain; and Phenergan 25 mg, a total of 30 given, one p.o. q.6h. with the Demerol p.r.n. for pain.  She will also be encouraged to use her incentive spirometer and we will see her back in the office next week. DD:  11/04/00 TD:  11/05/00 Job: [id number]

## 2011-02-06 NOTE — Op Note (Signed)
[place]. [facility]  Patient:    Jodi Valdez, Jodi Valdez                MRN: [id number] Proc. Date: 12/02/00 Adm. Date:  [id number] Attending:  Janalyn Rouse                           Operative Report  PREOPERATIVE DIAGNOSIS:  Carcinoma of the left breast.  POSTOPERATIVE DIAGNOSIS:  Carcinoma of the left breast.  OPERATION PERFORMED:  Port-A-Cath insertion.  SURGEON:  Rose Phi. Maple Hudson, M.D.  ANESTHESIA:  MAC.  DESCRIPTION OF PROCEDURE:  The patient was placed on the operating table and the right upper chest and neck prepped and draped in the usual fashion with a roll between her shoulders.  A right subclavian puncture was then carried out and after several tries I was never able to get the catheter to go down into the inferior vena cava.  Every time it would go up north into the internal jugular.  I then asked Dr. Ivin Booty to do an internal jugular puncture which he did and we passed the guide wire with ease.  We then tunneled between the anterior chest wall and the internal jugular puncture site after developing a pocket for the port.  I then passed the preattached catheter and then anchored the port in the pocket with two 2-0 Prolene sutures.  The catheter tip was appropriately trimmed and then we passed the dilator and peel-away sheath over the wire and then removed the wire and the dilator and passed the catheter through the peel-away sheath and then removed it. The system flushed easily. After closing the wound with 3-0 Vicryl and subcuticular 4-0 Monocryl and Steri-Strips we then accessed the system with the right angle Huber point needle and the catheter adapter.  The system was then flushed and fully heparinized.  Dressing applied and the patient transferred to the recovery room in satisfactory condition having tolerated the procedure well. DD:  12/02/00 TD:  12/02/00 Job: [id number]

## 2011-06-04 ENCOUNTER — Other Ambulatory Visit: Payer: Self-pay | Admitting: Neurology

## 2011-06-04 DIAGNOSIS — G252 Other specified forms of tremor: Secondary | ICD-10-CM

## 2011-06-09 ENCOUNTER — Ambulatory Visit
Admission: RE | Admit: 2011-06-09 | Discharge: 2011-06-09 | Disposition: A | Discharge status: Home / Self Care | Payer: Medicare PPO | Source: Ambulatory Visit | Attending: Neurology | Admitting: Neurology

## 2011-06-09 DIAGNOSIS — G252 Other specified forms of tremor: Secondary | ICD-10-CM

## 2011-06-09 DIAGNOSIS — G250 Essential tremor: Secondary | ICD-10-CM

## 2011-06-23 LAB — BASIC METABOLIC PANEL
BUN: 18
CO2: 27
Chloride: 108
Creatinine, Ser: 0.62
Glucose, Bld: 95
Potassium: 4.4

## 2011-06-23 LAB — CBC
HCT: 35.8 — ABNORMAL LOW
MCHC: 33.7
MCV: 96.3
Platelets: 215
RDW: 12.6
WBC: 7.4

## 2011-06-23 LAB — POCT CARDIAC MARKERS
CKMB, poc: 1.7
Myoglobin, poc: 56.5

## 2011-07-17 ENCOUNTER — Encounter: Payer: Self-pay | Admitting: *Deleted

## 2011-07-23 ENCOUNTER — Encounter: Payer: Self-pay | Admitting: *Deleted

## 2011-08-18 ENCOUNTER — Ambulatory Visit (HOSPITAL_BASED_OUTPATIENT_CLINIC_OR_DEPARTMENT_OTHER): Payer: Medicare PPO | Admitting: Oncology

## 2011-08-18 ENCOUNTER — Telehealth: Payer: Self-pay | Admitting: Oncology

## 2011-08-18 ENCOUNTER — Encounter: Payer: Self-pay | Admitting: *Deleted

## 2011-08-18 VITALS — BP 154/80 | HR 74 | Temp 97.6°F | Ht 65.0 in | Wt 140.8 lb

## 2011-08-18 DIAGNOSIS — C50919 Malignant neoplasm of unspecified site of unspecified female breast: Secondary | ICD-10-CM

## 2011-08-18 DIAGNOSIS — Z853 Personal history of malignant neoplasm of breast: Secondary | ICD-10-CM

## 2011-08-18 NOTE — Progress Notes (Signed)
08/18/11 at 14:38 NSABP B-42 month 60/End of treatment visit.  The Jodi Valdez was into the cancer center today for her month 60 visit on the B-42 study.  The Jodi Valdez returned all of her study drug bottles (bottles 16, 18, 19, and 20).  Bottle 16 had 21 remaining pills.  Bottle 18 had 19 remaining pills.  Bottle 19 had 46 remaining pills, and Bottle 20 had "0" remaining pills.  The Jodi Valdez stated that she took 1 pill daily about "90%" of the time.  She stated that she took her last pill this am.  Rn asked the Jodi Valdez if she wanted to continue taking her pills until her 5 year anniversary date of 08/25/11.  She stated that she  Was ready to stop her study medication.  Rn thanked the Jodi Valdez for her support of this clinical trial.  The Jodi Valdez denies any hospitalizations since her last follow up visit in May 2012.  She specifically denies any joint pain, cardiac problems, gastrointestinal problems, and dyspnea.  She states that in September she developed some facial tremors.  She said she saw a neurologist, and he stated that it was just hereditary.  Her MRI was negative for any neurological problems.  Dr. Truett Perna stated that he does not think the new, mild facial tremors (Grade 1) are related to her study drug. He said that these types of tremors are mostly hereditary.  She states her hot flashes are intermittent and moderate. Hot flashes  Grade 2.  She said that her latest cholesterol was normal.  Rn will obtain the Jodi Valdez's latest labs from Dr. Miguel Rota office.  The Jodi Valdez was seen and examined today by Dr. Truett Perna.  He stated the Jodi Valdez should rest assured that she is "cured" from her breast cancer.  It has been 10 years out from her original diagnosis.  She denies any broken bones.  Her height and weight were obtained.  Her medication list was reviewed.  She denies any new bone health medications.  The Jodi Valdez denies any limitations in her activities. ECOG=0.  Rn informed the Jodi Valdez that the research nurse would call her in 6 months for her final AE assessment.   The Jodi Valdez then will be seen yearly for follow up.  All of the Jodi Valdez's study drug was taken to pharmacy for drug accountability and disposal.

## 2011-08-18 NOTE — Telephone Encounter (Signed)
gve the pt her nov 2013 appt calendar along with the mammo appt at [facility]

## 2011-08-18 NOTE — Progress Notes (Signed)
OFFICE PROGRESS NOTE   INTERVAL HISTORY:   She returns as scheduled. She continues the study drug. She reports being diagnosed with a "tremor" by Dr. Anne Hahn. This affects the chin and left eye. She denies pain and there has been no change at the left chest wall or right breast. A mammogram was negative in March of 2012.  Objective:  Vital signs in last 24 hours:  Blood pressure 154/80, pulse 74, temperature 97.6 F (36.4 C), temperature source Oral, height 5\' 5"  (1.651 m), weight 140 lb 12.8 oz (63.866 kg).    HEENT: Neck without mass Lymphatics: No cervical, supraclavicular, or left axillary nodes.? 1/2 cm soft mobile right axillary node versus prominent fatty tissue Resp: Lungs clear Cardio: Regular rate and rhythm GI: No hepatic Vascular: The left lower leg is slightly larger than the right side. No edema     Medications: I have reviewed the patient's current medications.  Assessment/Plan:  Mrs. Zahn was diagnosed with stage III left-sided breast cancer in February of 2002. She began treatment on the NSABP-B42 study in December of 2007. She has now completed the 5 years of study medication. She remains in clinical remission.  Disposition:  She would like to continue followup at the cancer Center. She will return for an office visit in one year. She'll be scheduled for a mammogram in March of 2013.   Lucile Shutters, MD  08/18/2011  1:53 PM

## 2011-08-19 ENCOUNTER — Telehealth: Payer: Self-pay | Admitting: Oncology

## 2011-08-19 NOTE — Telephone Encounter (Signed)
Faxed over the order for the pt's mamogram appt. Fax number [phone]

## 2011-11-25 ENCOUNTER — Telehealth: Payer: Self-pay | Admitting: *Deleted

## 2011-11-25 NOTE — Telephone Encounter (Signed)
Lurena Joiner called requesting orders for screening mammogram for this patient who is scheduled tomorrow.  This nurse faxed copy of EPIC order to her at [phone].

## 2011-12-09 ENCOUNTER — Encounter: Payer: Self-pay | Admitting: Oncology

## 2012-02-16 ENCOUNTER — Encounter: Payer: Self-pay | Admitting: *Deleted

## 2012-02-16 NOTE — Progress Notes (Signed)
02/16/12 at 10:20am - NSABP B-42 Final AE assessment.   The research nurse called and spoke directly to the pt.  Rn explained that this is her final AE assessment since she discontinued study drug in November 2012.  The pt asked the research nurse if she was "on the real drug or the placebo".  The research nurse said that we have not received any "unblinding" information from the study.  Rn reassured the pt that once that information is available, then she will be notified.  The pt denied any hospitalizations since her last follow visit.  She states she has been in overall good health.  She denies any new medical diagnoses.  She states her facial tremors have not worsened.  She also states her hot flashes are mild in nature (hot flashes-grade 1).  She also denies any broken bones.  The pt denies any lingering side effects since she stopped her study drug in the fall.  The pt was thanked for her participation in the study.  The nurse informed the pt that her chart would be given to our follow-up research assistant for further data collection.  The pt is aware of her next appointment with Dr. Truett Perna in December 2013.

## 2012-06-16 ENCOUNTER — Other Ambulatory Visit: Payer: Self-pay | Admitting: Surgery

## 2012-08-18 ENCOUNTER — Ambulatory Visit: Payer: Medicare PPO | Admitting: Oncology

## 2012-08-19 ENCOUNTER — Telehealth: Payer: Self-pay | Admitting: Oncology

## 2012-08-19 NOTE — Telephone Encounter (Signed)
S/w the pt and she is aware of her cancelled 08/22/2012 appt  And wanted to be r/s for jan at her request. Pt is aware of the new d/t.

## 2012-08-22 ENCOUNTER — Ambulatory Visit: Payer: Medicare PPO | Admitting: Oncology

## 2012-09-13 ENCOUNTER — Ambulatory Visit: Payer: Medicare PPO | Admitting: Oncology

## 2012-10-03 ENCOUNTER — Telehealth: Payer: Self-pay | Admitting: Oncology

## 2012-10-03 ENCOUNTER — Ambulatory Visit (HOSPITAL_BASED_OUTPATIENT_CLINIC_OR_DEPARTMENT_OTHER): Payer: Medicare PPO | Admitting: Oncology

## 2012-10-03 VITALS — BP 145/87 | HR 103 | Temp 98.3°F | Resp 18 | Ht 65.0 in | Wt 138.4 lb

## 2012-10-03 DIAGNOSIS — Z853 Personal history of malignant neoplasm of breast: Secondary | ICD-10-CM

## 2012-10-03 NOTE — Progress Notes (Signed)
   [facility]    OFFICE PROGRESS NOTE   INTERVAL HISTORY:   She returns as scheduled. She reports being diagnosed with polymyalgia and is being treated with Plaquenil/prednisone. The bone discomfort has improved. No other complaint. She is being followed by rheumatology. A mammogram was negative in March of 2013.  Objective:  Vital signs in last 24 hours:  Blood pressure 145/87, pulse 103, temperature 98.3 F (36.8 C), temperature source Oral, resp. rate 18, height 5\' 5"  (1.651 m), weight 138 lb 6.4 oz (62.778 kg).    HEENT: Neck without mass Lymphatics: No cervical, supraclavicular, or left axillary nodes. Pea-sized mobile and soft right axillary node Resp: Lungs clear bilaterally Cardio: Regular rate and rhythm GI: No hepatomegaly Vascular: The left lower leg is slightly larger than the right side, no edema Breasts: Status post left mastectomy with a TRAM reconstruction. No evidence for chest wall tumor recurrence. Right breast without mass   Lab Results:  Lab Results  Component Value Date   WBC 7.4 08/11/2008   HGB 12.1 08/11/2008   HCT 35.8* 08/11/2008   MCV 96.3 08/11/2008   PLT 215 08/11/2008      Medications: I have reviewed the patient's current medications.  Assessment/Plan:  1. History of stage III left-sided breast cancer diagnosed in February 2002-she completed treatment on the NSABP-B. 42 study in November of 2012. She remains in clinical remission.  2. History of osteopenia-she will be scheduled for a bone density scan in March of 2014   3. Polymyalgia rheumatica-followed by rheumatology  Disposition:  She remains in clinical remission from breast cancer. She will be scheduled for a mammogram and bone density scan in March of 2014. She would like to continue followup at the cancer Center. She will return for an office visit in one year.   Thornton Papas, MD  10/03/2012  1:04 PM

## 2012-10-03 NOTE — Telephone Encounter (Signed)
Gave pt appt for January 2015 MD only , pt will have mammogram and Bone density @ [facility] on 3/10 1pm, pt aware of appts and gave her order for Bone density and Mammogram. She will bring order to Radiology in [facility]

## 2012-11-28 ENCOUNTER — Other Ambulatory Visit: Payer: Self-pay | Admitting: *Deleted

## 2012-11-28 NOTE — Progress Notes (Signed)
Call from [facility] for bone density scan orders. Order printed and faxed to [place] at [phone].

## 2012-12-19 ENCOUNTER — Encounter: Payer: Self-pay | Admitting: Oncology

## 2012-12-21 ENCOUNTER — Telehealth: Payer: Self-pay | Admitting: *Deleted

## 2012-12-21 NOTE — Telephone Encounter (Signed)
Left VM for husband to call back regarding her bone density test results and name of her PCP so copy can be forwarded there for management of her osteoporosis at request of Dr. Truett Perna.

## 2012-12-22 ENCOUNTER — Telehealth: Payer: Self-pay | Admitting: Medical Oncology

## 2012-12-22 NOTE — Telephone Encounter (Signed)
Faxed dxa bone densitometry results to PCP.

## 2013-09-21 HISTORY — PX: COLONOSCOPY: SHX174

## 2013-09-28 ENCOUNTER — Telehealth: Payer: Self-pay | Admitting: Oncology

## 2013-09-28 NOTE — Telephone Encounter (Signed)
Pt called r/s md visit on 1/13 to March 2015 ,nurse notified

## 2013-10-03 ENCOUNTER — Ambulatory Visit: Payer: Medicare PPO | Admitting: Oncology

## 2013-12-01 ENCOUNTER — Telehealth: Payer: Self-pay | Admitting: *Deleted

## 2013-12-01 NOTE — Telephone Encounter (Signed)
Patient inquired if OK for her to have allergy injections to her left arm? Had [facility] and TRAM in 2002 (7 nodes removed per path report).  Dr. Benay Spice prefers she check with her surgeon about this. She reports her surgeon, Dr. Annamaria Boots is retired. Informed her that the practice still has her records and can give her their educated opinion. She adds that she has already had #2 injections in her left arm without problem. Reminded her that problems with lymphedema can come up any time.

## 2013-12-15 ENCOUNTER — Telehealth: Payer: Self-pay | Admitting: Oncology

## 2013-12-15 ENCOUNTER — Ambulatory Visit (HOSPITAL_BASED_OUTPATIENT_CLINIC_OR_DEPARTMENT_OTHER): Payer: Medicare PPO | Admitting: Oncology

## 2013-12-15 VITALS — BP 142/59 | HR 98 | Temp 97.8°F | Resp 18 | Ht 65.0 in | Wt 148.2 lb

## 2013-12-15 DIAGNOSIS — Z853 Personal history of malignant neoplasm of breast: Secondary | ICD-10-CM

## 2013-12-15 DIAGNOSIS — M810 Age-related osteoporosis without current pathological fracture: Secondary | ICD-10-CM

## 2013-12-15 NOTE — Telephone Encounter (Signed)
lvm for pt regarding to [facility] on 4.10.15 @ 11:45am

## 2013-12-15 NOTE — Telephone Encounter (Signed)
gv adn printed appt sched adn avs for pt for April 2016....Marland Kitchenlvm for Rh Mammo tosched appt...they are to call me and pt

## 2013-12-15 NOTE — Progress Notes (Signed)
  [facility] OFFICE PROGRESS NOTE   Diagnosis: Breast cancer  INTERVAL HISTORY:   She returns as scheduled. She reports being diagnosed with a dog allergy after developing dyspnea a few months ago. She is now receiving allergy shots. No change over the chest wall. Good appetite. She continues prednisone and Plaquenil for treatment of polymyalgia. She did not have a mammogram this year.  Objective:  Vital signs in last 24 hours:  Blood pressure 142/59, pulse 98, temperature 97.8 F (36.6 C), temperature source Oral, resp. rate 18, height 5\' 5"  (1.651 m), weight 148 lb 3.2 oz (67.223 kg), SpO2 99.00%.    HEENT: Neck without mass Lymphatics: No cervical, supra-clavicular, or axillary nodes Resp: Lungs clear bilaterally Cardio: Regular rate and rhythm GI: No hepatomegaly Vascular: No leg edema Breasts: Right breast without mass. Status post left mastectomy with a TRAM reconstruction. No evidence for chest wall tumor recurrence.    Imaging:  No results found.  Medications: I have reviewed the patient's current medications.  Assessment/Plan: 1.History of stage III left-sided breast cancer diagnosed in February 2002-she completed treatment on the NSABP-B. 42 study in November of 2012. She remains in clinical remission.  2. History of osteopenia-she will be scheduled for a bone density scan in March of 2014  3. Polymyalgia rheumatica-followed by rheumatology   Disposition:  She remains in clinical remission from breast cancer. She will be scheduled for a mammogram within the next few weeks. Mrs. Mccorvey would like to continue followup at the [facility]. She will return for an office visit in one year.  Betsy Coder, MD  12/15/2013  10:30 AM

## 2014-01-30 ENCOUNTER — Encounter: Payer: Self-pay | Admitting: Oncology

## 2014-07-04 ENCOUNTER — Encounter: Payer: Self-pay | Admitting: Nurse Practitioner

## 2014-07-16 ENCOUNTER — Other Ambulatory Visit (INDEPENDENT_AMBULATORY_CARE_PROVIDER_SITE_OTHER): Payer: Medicare PPO

## 2014-07-16 ENCOUNTER — Encounter: Payer: Self-pay | Admitting: Nurse Practitioner

## 2014-07-16 ENCOUNTER — Ambulatory Visit (INDEPENDENT_AMBULATORY_CARE_PROVIDER_SITE_OTHER): Payer: Medicare PPO | Admitting: Nurse Practitioner

## 2014-07-16 VITALS — BP 160/64 | HR 76 | Ht 64.5 in | Wt 144.5 lb

## 2014-07-16 DIAGNOSIS — R933 Abnormal findings on diagnostic imaging of other parts of digestive tract: Secondary | ICD-10-CM

## 2014-07-16 DIAGNOSIS — R1013 Epigastric pain: Secondary | ICD-10-CM

## 2014-07-16 DIAGNOSIS — K5900 Constipation, unspecified: Secondary | ICD-10-CM

## 2014-07-16 DIAGNOSIS — K5909 Other constipation: Secondary | ICD-10-CM

## 2014-07-16 DIAGNOSIS — R1084 Generalized abdominal pain: Secondary | ICD-10-CM

## 2014-07-16 LAB — URINALYSIS
Bilirubin Urine: NEGATIVE
Hgb urine dipstick: NEGATIVE
KETONES UR: NEGATIVE
LEUKOCYTES UA: NEGATIVE
Nitrite: NEGATIVE
PH: 6.5 (ref 5.0–8.0)
SPECIFIC GRAVITY, URINE: 1.01 (ref 1.000–1.030)
Total Protein, Urine: NEGATIVE
UROBILINOGEN UA: 0.2 (ref 0.0–1.0)
Urine Glucose: NEGATIVE

## 2014-07-16 MED ORDER — LINACLOTIDE 290 MCG PO CAPS: 290.0000 ug | ORAL_CAPSULE | Freq: Every day | ORAL | Status: DC

## 2014-07-16 NOTE — Patient Instructions (Signed)
Please go to the basement level to our lab for a urine test. We have sent medications to your pharmacy for you to pick up at your convenience. Please call us if you are not getting any better.

## 2014-07-16 NOTE — Progress Notes (Signed)
HPI :  Patient is a 78 year old female known remotely to Dr. Henrene Pastor. She had a normal colonoscopy (except for hemorrhoids) in 2007. Procedure was done for evaluation of abdominal pain and constipation  Patient is referred by PCP, Dr. Lisbeth Ply, for evaluation of lower abdominal pain. Pain has been constant for a month. No associated nausea. No bloating. The pain is not affected by meals or defecation. She has been having frequent urge to defecate though most of the time doesn't. She is frequently constipated passing small balls of stool. Laxatives are helpful but lower abdominal pain persists even after evacuation of bowels. Patient is status post remote hysterectomy. She has no current urinary problems. Weight is stable.    Patient has a history of GERD, she takes Prilosec on a daily basis.   Past Medical History  Diagnosis Date  . Cancer 11/01/2000    left breast cancer  . Hypertension   . Asthma   . Polymyalgia     Family History  Problem Relation Age of Onset  . Colon cancer Neg Hx   . Colon polyps Neg Hx   . Diabetes Neg Hx   . Kidney disease Neg Hx   . Esophageal cancer Neg Hx    History  Substance Use Topics  . Smoking status: Never Smoker   . Smokeless tobacco: Not on file  . Alcohol Use: No   Current Outpatient Prescriptions  Medication Sig Dispense Refill  . alendronate (FOSAMAX) 70 MG tablet Take 70 mg by mouth once a week.      . AMBULATORY NON FORMULARY MEDICATION Apply 1,000 Units to eye 2 (two) times daily. Perservision Eye Vitamin      . benazepril (LOTENSIN) 20 MG tablet Take 40 mg by mouth daily.       Marland Kitchen CALCIUM-VITAMIN D PO Take 600 mg by mouth 2 (two) times daily.        . carvedilol (COREG) 3.125 MG tablet Take 3.125 mg by mouth daily.      . Cholecalciferol (VITAMIN D3) 1000 UNITS CAPS Take 1,000 Units by mouth 2 (two) times daily.      Marland Kitchen EPIPEN 2-PAK 0.3 MG/0.3ML SOAJ injection as needed.      Marland Kitchen FLUVIRIN INJ injection       . hydrochlorothiazide  (HYDRODIURIL) 12.5 MG tablet Take 12.5 mg by mouth Daily.      . hydroxychloroquine (PLAQUENIL) 200 MG tablet Take 200 mg by mouth 2 (two) times daily.      Marland Kitchen LEVOTHYROXINE SODIUM PO Take 0.062 mg by mouth.       . montelukast (SINGULAIR) 10 MG tablet Take 10 mg by mouth daily after breakfast.      . Multiple Vitamins-Minerals (PRESERVISION/LUTEIN PO) Take 1 tablet by mouth 2 (two) times daily.        . Omega-3 Fatty Acids (OMEGA 3 PO) Take 1,000 mg by mouth 2 (two) times daily.        Marland Kitchen omeprazole (PRILOSEC) 40 MG capsule Take 40 mg by mouth daily.      . predniSONE (DELTASONE) 1 MG tablet Take 10 mg by mouth Daily.      . ranitidine (ZANTAC) 300 MG tablet Take 300 mg by mouth at bedtime.      . simvastatin (ZOCOR) 40 MG tablet Take 40 mg by mouth at bedtime.         No current facility-administered medications for this visit.   Allergies  Allergen Reactions  . Codeine   . Codeine Sulfate  Nausea Only     Review of Systems: Possible allergy/sinus trouble, back pain, vision changes, cough, fatigue, night sweats and voice changes. All other systems reviewed and negative except where noted in HPI.      Physical Exam: BP 160/64  Pulse 76  Ht 5' 4.5" (1.638 m)  Wt 144 lb 8 oz (65.545 kg)  BMI 24.43 kg/m2 Constitutional: Pleasant,well-developed, white female in no acute distress. HEENT: Normocephalic and atraumatic. Conjunctivae are normal. No scleral icterus. Neck supple.  Cardiovascular: Normal rate, regular rhythm.  Pulmonary/chest: Effort normal and breath sounds normal. No wheezing, rales or rhonchi. Abdominal: Soft, nondistended, nontender. Bowel sounds active throughout. There are no masses palpable. No hepatomegaly. Extremities: no edema Lymphadenopathy: No cervical adenopathy noted. Neurological: Alert and oriented to person place and time. Skin: Skin is warm and dry. No rashes noted. Psychiatric: Normal mood and affect. Behavior is normal.  ASSESSMENT AND PLAN:  37.  78 year old female with a one month history of lower abdominal pain unrelated to meals or defecation. Recent CT scan with contrast nondiagnostic. Recent labs unremarkable. The patient reports frequent constipation though lower abdominal pain not relieved when she evacuates bowels.  I still think we need to make sure  constipation not contributing to pain. Will try Linzess . Obtain urinalysis. Patient will follow up here in a few weeks, or sooner if symptoms don't improve / worsen. If no improvement will consider repeat colonoscopy, especially given that patient is having the sensation she needs to defecate when she really doesn't.   2. Pancreatic lesion. Recent CT scan shows a 13 mm, nonenhancing fluid density lesion in the pancreatic uncinate process, possibly reflecting a pseudocyst or IPMN. Findings are new from 2011. Follow-up MRI with and without contrast recommended 1 year, we will send out reminder to her.

## 2014-07-17 DIAGNOSIS — R933 Abnormal findings on diagnostic imaging of other parts of digestive tract: Secondary | ICD-10-CM | POA: Insufficient documentation

## 2014-07-17 NOTE — Progress Notes (Signed)
Discussed with NP. Agree with initial assessment and plans

## 2014-07-20 ENCOUNTER — Telehealth: Payer: Self-pay | Admitting: *Deleted

## 2014-07-20 DIAGNOSIS — R1030 Lower abdominal pain, unspecified: Secondary | ICD-10-CM

## 2014-07-20 NOTE — Telephone Encounter (Signed)
Spoke with patient and gave her recommendations. Scheduled for Prop colon on 08/01/14 at 9:00 AM and pre visit on 07/24/14 at 1:30 pm.

## 2014-07-20 NOTE — Telephone Encounter (Signed)
Message copied by Hulan Saas on Fri Jul 20, 2014  3:00 PM ------      Message from: Willia Craze      Created: Fri Jul 20, 2014  1:59 PM       Rollene Fare, please see Dr. Blanch Media note. If patient wants to proceed with colonoscopy he can do it Monday      Thanks      ----- Message -----         From: Irene Shipper, MD         Sent: 07/20/2014  11:18 AM           To: Willia Craze, NP            Reviewed.Marland KitchenMarland KitchenColonoscopy is reasonable at this point. Set her up. I have availability on Monday. Thanks       ----- Message -----         From: Willia Craze, NP         Sent: 07/20/2014  12:12 AM           To: Irene Shipper, MD            Please refer to my last office note. Patient still symptomatic. If bowels moving well on Linzess but still symptomatic then I think colonoscopy next step, her last one was in 2007. I know it is low yield for pain but CTscan negative and she has these weird (unproductive) urges to defecate.      Thoughts?      Thanks             ------

## 2014-07-24 ENCOUNTER — Ambulatory Visit (AMBULATORY_SURGERY_CENTER): Payer: Self-pay | Admitting: *Deleted

## 2014-07-24 VITALS — Ht 64.5 in | Wt 145.4 lb

## 2014-07-24 DIAGNOSIS — R1030 Lower abdominal pain, unspecified: Secondary | ICD-10-CM

## 2014-07-24 MED ORDER — MOVIPREP 100 G PO SOLR: ORAL | Status: DC

## 2014-07-24 NOTE — Progress Notes (Signed)
No allergies to eggs or soy. No problems with anesthesia.  Pt given Emmi instructions for colonoscopy  No oxygen use  No diet drug use  

## 2014-08-01 ENCOUNTER — Encounter: Payer: Self-pay | Admitting: Internal Medicine

## 2014-08-01 ENCOUNTER — Ambulatory Visit (AMBULATORY_SURGERY_CENTER): Payer: Medicare PPO | Admitting: Internal Medicine

## 2014-08-01 VITALS — BP 141/78 | HR 76 | Temp 95.8°F | Resp 13 | Ht 64.5 in | Wt 145.0 lb

## 2014-08-01 DIAGNOSIS — R1031 Right lower quadrant pain: Secondary | ICD-10-CM

## 2014-08-01 DIAGNOSIS — K5900 Constipation, unspecified: Secondary | ICD-10-CM

## 2014-08-01 DIAGNOSIS — K5901 Slow transit constipation: Secondary | ICD-10-CM

## 2014-08-01 DIAGNOSIS — R1030 Lower abdominal pain, unspecified: Secondary | ICD-10-CM

## 2014-08-01 DIAGNOSIS — R933 Abnormal findings on diagnostic imaging of other parts of digestive tract: Secondary | ICD-10-CM

## 2014-08-01 MED ORDER — SODIUM CHLORIDE 0.9 % IV SOLN: 500.0000 mL | INTRAVENOUS | Status: DC

## 2014-08-01 NOTE — Patient Instructions (Signed)
YOU HAD AN ENDOSCOPIC PROCEDURE TODAY AT THE [facility]: Refer to the procedure report that was given to you for any specific questions about what was found during the examination.  If the procedure report does not answer your questions, please call your gastroenterologist to clarify.  If you requested that your care partner not be given the details of your procedure findings, then the procedure report has been included in a sealed envelope for you to review at your convenience later.  YOU SHOULD EXPECT: Some feelings of bloating in the abdomen. Passage of more gas than usual.  Walking can help get rid of the air that was put into your GI tract during the procedure and reduce the bloating. If you had a lower endoscopy (such as a colonoscopy or flexible sigmoidoscopy) you may notice spotting of blood in your stool or on the toilet paper. If you underwent a bowel prep for your procedure, then you may not have a normal bowel movement for a few days.  DIET: Your first meal following the procedure should be a light meal and then it is ok to progress to your normal diet.  A half-sandwich or bowl of soup is an example of a good first meal.  Heavy or fried foods are harder to digest and may make you feel nauseous or bloated.  Likewise meals heavy in dairy and vegetables can cause extra gas to form and this can also increase the bloating.  Drink plenty of fluids but you should avoid alcoholic beverages for 24 hours.  ACTIVITY: Your care partner should take you home directly after the procedure.  You should plan to take it easy, moving slowly for the rest of the day.  You can resume normal activity the day after the procedure however you should NOT DRIVE or use heavy machinery for 24 hours (because of the sedation medicines used during the test).    SYMPTOMS TO REPORT IMMEDIATELY: A gastroenterologist can be reached at any hour.  During normal business hours, 8:30 AM to 5:00 PM Monday through Friday,  call [phone].  After hours and on weekends, please call the GI answering service at [phone] who will take a message and have the physician on call contact you.   Following lower endoscopy (colonoscopy or flexible sigmoidoscopy):  Excessive amounts of blood in the stool  Significant tenderness or worsening of abdominal pains  Swelling of the abdomen that is new, acute  Fever of 100F or higher  FOLLOW UP: If any biopsies were taken you will be contacted by phone or by letter within the next 1-3 weeks.  Call your gastroenterologist if you have not heard about the biopsies in 3 weeks.  Our staff will call the home number listed on your records the next business day following your procedure to check on you and address any questions or concerns that you may have at that time regarding the information given to you following your procedure. This is a courtesy call and so if there is no answer at the home number and we have not heard from you through the emergency physician on call, we will assume that you have returned to your regular daily activities without incident.  SIGNATURES/CONFIDENTIALITY: You and/or your care partner have signed paperwork which will be entered into your electronic medical record.  These signatures attest to the fact that that the information above on your After Visit Summary has been reviewed and is understood.  Full responsibility of the confidentiality of this   discharge information lies with you and/or your care-partner.  Normal colonoscopy.  Refer to reccommendations on colonoscopy procedure reprot.,

## 2014-08-01 NOTE — Op Note (Signed)
[employer]  [address]. [place], [address]   COLONOSCOPY PROCEDURE REPORT  PATIENT: Jodi Valdez, Jodi Valdez  MR#: [id number] BIRTHDATE: [birth year] , 78  yrs. old GENDER: female ENDOSCOPIST: Eustace Quail, MD REFERRED ID:[id number] Hamrick, M.D. PROCEDURE DATE:  08/01/2014 PROCEDURE:   Colonoscopy, diagnostic First Screening Colonoscopy - Avg.  risk and is 50 yrs.  old or older - No.  Prior Negative Screening - Now for repeat screening. N/A  History of Adenoma - Now for follow-up colonoscopy & has been > or = to 3 yrs.  N/A  Polyps Removed Today? No.  Recommend repeat exam, <10 yrs? No. ASA CLASS:   Class II INDICATIONS:abdominal pain in the lower right quadrant and constipation.   Negative colonoscopy 2007; recent CT negative for relavent abnormalities MEDICATIONS: Monitored anesthesia care and Propofol 250 mg IV  DESCRIPTION OF PROCEDURE:   After the risks benefits and alternatives of the procedure were thoroughly explained, informed consent was obtained.  The digital rectal exam revealed no abnormalities of the rectum.   The LB [id number] and LB [id number]  endoscope was introduced through the anus and advanced to the cecum, which was identified by both the appendix and ileocecal valve. No adverse events experienced.   The quality of the prep was excellent, using MoviPrep  The instrument was then slowly withdrawn as the colon was fully examined.  COLON FINDINGS: The examined terminal ileum appeared to be normal. A normal appearing cecum, ileocecal valve, and appendiceal orifice were identified.  The ascending, transverse, descending, sigmoid colon, and rectum appeared unremarkable.  Retroflexed views revealed internal hemorrhoids. The time to cecum=9 minutes 20 seconds.  Withdrawal time=9 minutes 52 seconds.  The scope was withdrawn and the procedure completed. COMPLICATIONS: There were no immediate complications.  ENDOSCOPIC IMPRESSION: 1.    The examined terminal ileum appeared to be normal 2.   Normal colonoscopy  RECOMMENDATIONS: 1.  Miralax powder Titrated to need to constipation (ok to take daily if needed) 2.  Repeat imaginging of the pancreas in one year as previously recommended 2.  Return to the care of your primary provider.  GI follow up as needed  eSigned:  Eustace Quail, MD 08/01/2014 9:49 AM   cc: Daiva Eves, MD and The Patient

## 2014-08-01 NOTE — Progress Notes (Signed)
Pt. questioned whether or not to continue Linzess.  Dr. Henrene Pastor advised that she continue if medication is helpful, to call office for refills if needed. Pt. advised of this recommendation.

## 2014-08-02 ENCOUNTER — Telehealth: Payer: Self-pay | Admitting: *Deleted

## 2014-08-02 NOTE — Telephone Encounter (Signed)
  Follow up Call-  Call back number 08/01/2014  Post procedure Call Back phone  # [phone]  Permission to leave phone message Yes     Patient questions:  Do you have a fever, pain , or abdominal swelling? No. Pain Score  0 *  Have you tolerated food without any problems? Yes.    Have you been able to return to your normal activities? Yes.    Do you have any questions about your discharge instructions: Diet   No. Medications  No. Follow up visit  No.  Do you have questions or concerns about your Care? No.  Actions: * If pain score is 4 or above: No action needed, pain <4.

## 2014-08-07 ENCOUNTER — Emergency Department (HOSPITAL_COMMUNITY)
Admission: EM | Admit: 2014-08-07 | Discharge: 2014-08-07 | Disposition: A | Discharge status: Home / Self Care | Payer: Medicare PPO | Attending: Emergency Medicine | Admitting: Emergency Medicine

## 2014-08-07 ENCOUNTER — Emergency Department (HOSPITAL_COMMUNITY): Payer: Medicare PPO

## 2014-08-07 ENCOUNTER — Encounter (HOSPITAL_COMMUNITY): Payer: Self-pay | Admitting: Emergency Medicine

## 2014-08-07 DIAGNOSIS — I10 Essential (primary) hypertension: Secondary | ICD-10-CM | POA: Diagnosis not present

## 2014-08-07 DIAGNOSIS — Z8739 Personal history of other diseases of the musculoskeletal system and connective tissue: Secondary | ICD-10-CM | POA: Diagnosis not present

## 2014-08-07 DIAGNOSIS — Z7952 Long term (current) use of systemic steroids: Secondary | ICD-10-CM | POA: Diagnosis not present

## 2014-08-07 DIAGNOSIS — K828 Other specified diseases of gallbladder: Secondary | ICD-10-CM | POA: Diagnosis not present

## 2014-08-07 DIAGNOSIS — Z9889 Other specified postprocedural states: Secondary | ICD-10-CM | POA: Diagnosis not present

## 2014-08-07 DIAGNOSIS — N858 Other specified noninflammatory disorders of uterus: Secondary | ICD-10-CM | POA: Diagnosis not present

## 2014-08-07 DIAGNOSIS — Z90710 Acquired absence of both cervix and uterus: Secondary | ICD-10-CM | POA: Diagnosis not present

## 2014-08-07 DIAGNOSIS — N390 Urinary tract infection, site not specified: Secondary | ICD-10-CM | POA: Diagnosis not present

## 2014-08-07 DIAGNOSIS — Z853 Personal history of malignant neoplasm of breast: Secondary | ICD-10-CM | POA: Insufficient documentation

## 2014-08-07 DIAGNOSIS — J45909 Unspecified asthma, uncomplicated: Secondary | ICD-10-CM | POA: Insufficient documentation

## 2014-08-07 DIAGNOSIS — R109 Unspecified abdominal pain: Secondary | ICD-10-CM

## 2014-08-07 DIAGNOSIS — Z79899 Other long term (current) drug therapy: Secondary | ICD-10-CM | POA: Insufficient documentation

## 2014-08-07 DIAGNOSIS — R102 Pelvic and perineal pain: Secondary | ICD-10-CM

## 2014-08-07 DIAGNOSIS — K219 Gastro-esophageal reflux disease without esophagitis: Secondary | ICD-10-CM | POA: Insufficient documentation

## 2014-08-07 DIAGNOSIS — R1030 Lower abdominal pain, unspecified: Secondary | ICD-10-CM

## 2014-08-07 LAB — CBC WITH DIFFERENTIAL/PLATELET
Basophils Absolute: 0.1 10*3/uL (ref 0.0–0.1)
Basophils Relative: 1 % (ref 0–1)
EOS PCT: 1 % (ref 0–5)
Eosinophils Absolute: 0.1 10*3/uL (ref 0.0–0.7)
HEMATOCRIT: 41.7 % (ref 36.0–46.0)
HEMOGLOBIN: 13.9 g/dL (ref 12.0–15.0)
LYMPHS PCT: 26 % (ref 12–46)
Lymphs Abs: 2.3 10*3/uL (ref 0.7–4.0)
MCH: 31.9 pg (ref 26.0–34.0)
MCHC: 33.3 g/dL (ref 30.0–36.0)
MCV: 95.6 fL (ref 78.0–100.0)
MONO ABS: 0.9 10*3/uL (ref 0.1–1.0)
MONOS PCT: 10 % (ref 3–12)
NEUTROS ABS: 5.5 10*3/uL (ref 1.7–7.7)
Neutrophils Relative %: 62 % (ref 43–77)
Platelets: 279 10*3/uL (ref 150–400)
RBC: 4.36 MIL/uL (ref 3.87–5.11)
RDW: 12.7 % (ref 11.5–15.5)
WBC: 8.8 10*3/uL (ref 4.0–10.5)

## 2014-08-07 LAB — COMPREHENSIVE METABOLIC PANEL
ALT: 25 U/L (ref 0–35)
ANION GAP: 13 (ref 5–15)
AST: 16 U/L (ref 0–37)
Albumin: 3.8 g/dL (ref 3.5–5.2)
Alkaline Phosphatase: 48 U/L (ref 39–117)
BILIRUBIN TOTAL: 0.4 mg/dL (ref 0.3–1.2)
BUN: 17 mg/dL (ref 6–23)
CALCIUM: 9.9 mg/dL (ref 8.4–10.5)
CHLORIDE: 95 meq/L — AB (ref 96–112)
CO2: 31 meq/L (ref 19–32)
CREATININE: 0.95 mg/dL (ref 0.50–1.10)
GFR, EST AFRICAN AMERICAN: 66 mL/min — AB (ref >90)
GFR, EST NON AFRICAN AMERICAN: 57 mL/min — AB (ref >90)
GLUCOSE: 114 mg/dL — AB (ref 70–99)
Potassium: 3.8 mEq/L (ref 3.7–5.3)
Sodium: 139 mEq/L (ref 137–147)
Total Protein: 7.1 g/dL (ref 6.0–8.3)

## 2014-08-07 LAB — I-STAT TROPONIN, ED: Troponin i, poc: 0 ng/mL (ref 0.00–0.08)

## 2014-08-07 LAB — URINALYSIS, ROUTINE W REFLEX MICROSCOPIC
BILIRUBIN URINE: NEGATIVE
Glucose, UA: NEGATIVE mg/dL
Hgb urine dipstick: NEGATIVE
Ketones, ur: NEGATIVE mg/dL
NITRITE: NEGATIVE
PROTEIN: NEGATIVE mg/dL
Specific Gravity, Urine: 1.012 (ref 1.005–1.030)
Urobilinogen, UA: 0.2 mg/dL (ref 0.0–1.0)
pH: 6.5 (ref 5.0–8.0)

## 2014-08-07 LAB — LIPASE, BLOOD: LIPASE: 25 U/L (ref 11–59)

## 2014-08-07 LAB — URINE MICROSCOPIC-ADD ON

## 2014-08-07 MED ORDER — IOHEXOL 300 MG/ML  SOLN: 25.0000 mL | Freq: Once | INTRAMUSCULAR | Status: DC | PRN

## 2014-08-07 MED ORDER — IOHEXOL 300 MG/ML  SOLN
100.0000 mL | Freq: Once | INTRAMUSCULAR | Status: AC | PRN
  Administered 2014-08-07: 100 mL via INTRAVENOUS

## 2014-08-07 MED ORDER — ONDANSETRON HCL 4 MG/2ML IJ SOLN
4.0000 mg | Freq: Once | INTRAMUSCULAR | Status: AC
  Administered 2014-08-07: 4 mg via INTRAVENOUS
  Filled 2014-08-07: qty 2

## 2014-08-07 MED ORDER — SODIUM CHLORIDE 0.9 % IV BOLUS (SEPSIS)
1000.0000 mL | Freq: Once | INTRAVENOUS | Status: AC
  Administered 2014-08-07: 1000 mL via INTRAVENOUS

## 2014-08-07 MED ORDER — GI COCKTAIL ~~LOC~~
30.0000 mL | Freq: Once | ORAL | Status: AC
  Administered 2014-08-07: 30 mL via ORAL
  Filled 2014-08-07: qty 30

## 2014-08-07 MED ORDER — ONDANSETRON 4 MG PO TBDP: ORAL_TABLET | ORAL | Status: DC

## 2014-08-07 MED ORDER — HYDROCODONE-ACETAMINOPHEN 5-325 MG PO TABS: 1.0000 | ORAL_TABLET | Freq: Four times a day (QID) | ORAL | Status: DC | PRN

## 2014-08-07 MED ORDER — CIPROFLOXACIN HCL 500 MG PO TABS: 500.0000 mg | ORAL_TABLET | Freq: Two times a day (BID) | ORAL | Status: DC

## 2014-08-07 MED ORDER — DEXTROSE 5 % IV SOLN
1.0000 g | Freq: Once | INTRAVENOUS | Status: AC
  Administered 2014-08-07: 1 g via INTRAVENOUS
  Filled 2014-08-07: qty 10

## 2014-08-07 NOTE — ED Notes (Signed)
Pt c/o lower abd pain and nausea for past two months, and sts she has had a decrease in appetite for past couple of weeks and has lost 4-5lbs. Pt pain 10/10. Has hx of breast cancer 2002.

## 2014-08-07 NOTE — ED Notes (Signed)
Ct notified pt done with contrast

## 2014-08-07 NOTE — Discharge Instructions (Signed)
Take cipro for a week.   Take tylenol for pain.   Take vicodin for severe pain.   Take zofran for nausea.   Follow up with a surgeon for biliary sludge.   Return to ER if you have severe pain, vomiting, fever.

## 2014-08-07 NOTE — ED Provider Notes (Signed)
CSN: [id number]     Arrival date & time 08/07/14  4098 History   First MD Initiated Contact with Patient 08/07/14 [phone]     Chief Complaint  Patient presents with  . Abdominal Pain     (Consider location/radiation/quality/duration/timing/severity/associated sxs/prior Treatment) The history is provided by the patient.  Jodi Valdez is a 78 y.o. female hx of breast Ca in remission, GERD, HTN here with weight loss, poor appetite, lower abdominal pain. Patient has been having intermittent lower abdominal pain for the last 2 months. Has been having decreased appetite. Also has loss about 5 pounds. Had a CT abdomen and pelvis a month ago with PMD that was unremarkable. Had a colonoscopy with Dr. Henrene Pastor several months ago that was unremarkable. She is scheduled for US transvag to r/o ovarian cancer. She had CA125 sent by PMD yesterday. Pain worse last night, has nausea but no vomiting. No fevers or urinary symptoms.    Past Medical History  Diagnosis Date  . Cancer 11/01/2000    left breast cancer  . Hypertension   . Asthma   . Polymyalgia   . Allergy   . GERD (gastroesophageal reflux disease)    Past Surgical History  Procedure Laterality Date  . Breast surgery Left 11/01/2000    masectomy  . Cystocele repair  09/1999  . Rectocele repair  09/1999  . Abdominal hysterectomy  1975   Family History  Problem Relation Age of Onset  . Colon cancer Neg Hx   . Colon polyps Neg Hx   . Diabetes Neg Hx   . Kidney disease Neg Hx   . Esophageal cancer Neg Hx    History  Substance Use Topics  . Smoking status: Never Smoker   . Smokeless tobacco: Never Used  . Alcohol Use: No   OB History    No data available     Review of Systems  Constitutional: Positive for unexpected weight change.  Gastrointestinal: Positive for nausea and abdominal pain.  All other systems reviewed and are negative.     Allergies  Codeine; Codeine sulfate; Nitrofuran derivatives; and Propranolol  Home  Medications   Prior to Admission medications   Medication Sig Start Date End Date Taking? Authorizing Provider  alendronate (FOSAMAX) 70 MG tablet Take 70 mg by mouth once a week. 12/12/13  Yes Historical Provider, MD  benazepril (LOTENSIN) 20 MG tablet Take 40 mg by mouth daily.    Yes Historical Provider, MD  CALCIUM-VITAMIN D PO Take 600 mg by mouth 2 (two) times daily.     Yes Historical Provider, MD  carvedilol (COREG) 3.125 MG tablet Take 3.125 mg by mouth daily. 12/02/13  Yes Historical Provider, MD  Cholecalciferol (VITAMIN D3) 1000 UNITS CAPS Take 1,000 Units by mouth 2 (two) times daily.   Yes Historical Provider, MD  hydrochlorothiazide (HYDRODIURIL) 12.5 MG tablet Take 12.5 mg by mouth Daily. 08/13/12  Yes Historical Provider, MD  hydroxychloroquine (PLAQUENIL) 200 MG tablet Take 200 mg by mouth 2 (two) times daily. 09/27/12  Yes Historical Provider, MD  levothyroxine (SYNTHROID, LEVOTHROID) 125 MCG tablet Take 125 mcg by mouth daily before breakfast.   Yes Historical Provider, MD  Linaclotide (LINZESS) 290 MCG CAPS capsule Take 1 capsule (290 mcg total) by mouth daily. 07/16/14  Yes Willia Craze, NP  montelukast (SINGULAIR) 10 MG tablet Take 10 mg by mouth daily after breakfast.   Yes Historical Provider, MD  Multiple Vitamins-Minerals (PRESERVISION/LUTEIN PO) Take 1 tablet by mouth 2 (two) times daily.  Yes Historical Provider, MD  Omega-3 Fatty Acids (OMEGA 3 PO) Take 1,000 mg by mouth 2 (two) times daily.     Yes Historical Provider, MD  omeprazole (PRILOSEC) 40 MG capsule Take 40 mg by mouth daily.   Yes Historical Provider, MD  predniSONE (DELTASONE) 1 MG tablet Take 7 mg by mouth Daily.  09/27/12  Yes Historical Provider, MD  ranitidine (ZANTAC) 300 MG tablet Take 300 mg by mouth at bedtime.   Yes Historical Provider, MD  simvastatin (ZOCOR) 40 MG tablet Take 40 mg by mouth at bedtime.     Yes Historical Provider, MD  EPIPEN 2-PAK 0.3 MG/0.3ML SOAJ injection as needed. 11/27/13    Historical Provider, MD   BP 137/53 mmHg  Pulse 93  Temp(Src) 97.5 F (36.4 C) (Oral)  Resp 20  Ht 5\' 4"  (1.626 m)  Wt 136 lb (61.689 kg)  BMI 23.33 kg/m2  SpO2 100% Physical Exam  Constitutional: She is oriented to person, place, and time.  Chronically ill, dehydrated   HENT:  Head: Normocephalic.  MM slightly dry   Eyes: Conjunctivae are normal. Pupils are equal, round, and reactive to light.  Neck: Normal range of motion. Neck supple.  Cardiovascular: Normal rate, regular rhythm and normal heart sounds.   Pulmonary/Chest: Effort normal and breath sounds normal. No respiratory distress. She has no wheezes. She has no rales.  Abdominal: Soft. Bowel sounds are normal.  Mild diffuse lower abdominal tenderness, no rebound   Musculoskeletal: Normal range of motion. She exhibits no edema or tenderness.  Neurological: She is alert and oriented to person, place, and time. No cranial nerve deficit. Coordination normal.  Skin: Skin is warm and dry.  Psychiatric: She has a normal mood and affect. Her behavior is normal. Judgment and thought content normal.  Nursing note and vitals reviewed.   ED Course  Procedures (including critical care time) Labs Review Labs Reviewed  COMPREHENSIVE METABOLIC PANEL - Abnormal; Notable for the following:    Chloride 95 (*)    Glucose, Bld 114 (*)    GFR calc non Af Amer 57 (*)    GFR calc Af Amer 66 (*)    All other components within normal limits  URINALYSIS, ROUTINE W REFLEX MICROSCOPIC - Abnormal; Notable for the following:    Leukocytes, UA SMALL (*)    All other components within normal limits  URINE MICROSCOPIC-ADD ON - Abnormal; Notable for the following:    Squamous Epithelial / LPF FEW (*)    Bacteria, UA FEW (*)    All other components within normal limits  CBC WITH DIFFERENTIAL  LIPASE, BLOOD  I-STAT TROPOININ, ED    Imaging Review US Abdomen Complete  08/07/2014   CLINICAL DATA:  Generalized abdominal pain.  EXAM:  ULTRASOUND ABDOMEN COMPLETE  COMPARISON:  CT scan of July 03, 2014 and December 11, 2009  FINDINGS: Gallbladder: No gallstones or wall thickening visualized. No sonographic Murphy sign noted. Mild amount of sludge is noted and gallbladder lumen.  Common bile duct: Diameter: 2.7 mm which is within normal limits.  Liver: 2 echogenic rounded foci are noted in right hepatic lobe measuring 9 and 8 mm respectively. Otherwise normal echogenicity of hepatic parenchyma is noted.  IVC: No abnormality visualized.  Pancreas: Visualized portion unremarkable.  Spleen: Size and appearance within normal limits.  Right Kidney: Length: 9.8 cm. Echogenicity within normal limits. No mass or hydronephrosis visualized.  Left Kidney: Length: 10 cm. Echogenicity within normal limits. No mass or hydronephrosis visualized.  Abdominal  aorta: No aneurysm visualized.  Other findings: None.  IMPRESSION: Sludge is noted within gallbladder lumen.  Two rounded echogenic foci are noted in right hepatic lobe. These most likely correspond to abnormalities described on most recent CT scan which are stable compared to prior CT scan, and therefore most likely represent benign hemangiomas.   Electronically Signed   By: Sabino Dick M.D.   On: 08/07/2014 10:47   US Transvaginal Non-ob  08/07/2014   CLINICAL DATA:  Abdominal pain  EXAM: TRANSABDOMINAL AND TRANSVAGINAL ULTRASOUND OF PELVIS  TECHNIQUE: Both transabdominal and transvaginal ultrasound examinations of the pelvis were performed. Transabdominal technique was performed for global imaging of the pelvis including uterus, ovaries, adnexal regions, and pelvic cul-de-sac. It was necessary to proceed with endovaginal exam following the transabdominal exam to visualize the adnexal in better detail.  COMPARISON:  07/03/2014  FINDINGS: Uterus  Surgically absent  Endometrium  Surgically absent  Right ovary  Measurements: 2.6 x 2.3 x 1.3 cm. Limited visualization, seen only on the transabdominal portion.  No significant abnormality.  Left ovary  Not visualized.  Other findings  No free fluid.  IMPRESSION: Prior hysterectomy.  Limited exam but no acute finding by ultrasound.   Electronically Signed   By: Daryll Brod M.D.   On: 08/07/2014 10:47   US Pelvis Complete  08/07/2014   CLINICAL DATA:  Abdominal pain  EXAM: TRANSABDOMINAL AND TRANSVAGINAL ULTRASOUND OF PELVIS  TECHNIQUE: Both transabdominal and transvaginal ultrasound examinations of the pelvis were performed. Transabdominal technique was performed for global imaging of the pelvis including uterus, ovaries, adnexal regions, and pelvic cul-de-sac. It was necessary to proceed with endovaginal exam following the transabdominal exam to visualize the adnexal in better detail.  COMPARISON:  07/03/2014  FINDINGS: Uterus  Surgically absent  Endometrium  Surgically absent  Right ovary  Measurements: 2.6 x 2.3 x 1.3 cm. Limited visualization, seen only on the transabdominal portion. No significant abnormality.  Left ovary  Not visualized.  Other findings  No free fluid.  IMPRESSION: Prior hysterectomy.  Limited exam but no acute finding by ultrasound.   Electronically Signed   By: Daryll Brod M.D.   On: 08/07/2014 10:47   Ct Abdomen Pelvis W Contrast  08/07/2014   CLINICAL DATA:  78 year old female with lower quadrant abdominal pain for 2 months with nausea and vomiting. Initial encounter.  EXAM: CT ABDOMEN AND PELVIS WITH CONTRAST  TECHNIQUE: Multidetector CT imaging of the abdomen and pelvis was performed using the standard protocol following bolus administration of intravenous contrast.  CONTRAST:  16mL OMNIPAQUE IOHEXOL 300 MG/ML  SOLN  COMPARISON:  CT Abdomen and Pelvis 07/03/2014 and earlier.  FINDINGS: Motion artifact at the lung bases. Dependent scarring or atelectasis appears stable. No pericardial or pleural effusion.  Degenerative changes in the lower lumbar spine. No acute osseous abnormality identified.  No pelvic free fluid. Stable, negative  rectum. Redundant sigmoid colon otherwise within normal limits. Unremarkable bladder. Uterus is surgically absent. Adnexal within normal limits.  Negative left colon, largely decompressed. Retained stool in the transverse colon and at both flexures, but otherwise negative transverse colon. Stool and oral contrast in the ascending colon. Negative terminal ileum. Appendix not identified and favored to be surgically absent. No dilated small bowel. Decompressed stomach. Negative duodenum.  Stable liver, gallbladder, spleen, pancreas (12 mm cystic lesion of the uncinate process), and adrenal glands. Portal venous system is patent. No abdominal free fluid. Renal enhancement and contrast excretion symmetric and within normal limits. No renal lesion. No  lymphadenopathy. Stable right abdominal wall surgical clips. No ventral abdominal hernia identified.  Extensive aortoiliac calcified atherosclerosis noted. Major arterial structures in the abdomen and pelvis are patent.  IMPRESSION: 1. No acute or inflammatory findings in the abdomen or pelvis. 2. Stable cystic lesion of the pancreatic uncinate process for which abdomen MRI without and with contrast follow-up was recommended for October 2016.   Electronically Signed   By: Lars Pinks M.D.   On: 08/07/2014 13:13     EKG Interpretation None      MDM   Final diagnoses:  Adnexal tenderness  Abdominal pain    Jodi Valdez is a 78 y.o. female here with lower abdominal pain. Had nl CT a month ago. Will get ovarian US to r/o mass.   11 AM  US showed mild gallbladder sludge but no ovarian cyst or mass. UA + UTI. Given rocephin. She feels that UTI shouldn't cause so much pain. Offered another CT but told her that it may not show anything new.   1:55 PM CT unchanged. Refused pain meds. Will d/c home with cipro, vicodin, zofran, surgery f/u.     Wandra Arthurs, MD 08/07/14 [phone]

## 2014-08-24 ENCOUNTER — Other Ambulatory Visit (INDEPENDENT_AMBULATORY_CARE_PROVIDER_SITE_OTHER): Payer: Self-pay

## 2014-08-24 ENCOUNTER — Other Ambulatory Visit (INDEPENDENT_AMBULATORY_CARE_PROVIDER_SITE_OTHER): Payer: Self-pay | Admitting: Surgery

## 2014-08-24 DIAGNOSIS — R1030 Lower abdominal pain, unspecified: Secondary | ICD-10-CM

## 2014-08-30 ENCOUNTER — Other Ambulatory Visit (INDEPENDENT_AMBULATORY_CARE_PROVIDER_SITE_OTHER): Payer: Self-pay | Admitting: Surgery

## 2014-08-30 ENCOUNTER — Ambulatory Visit
Admission: RE | Admit: 2014-08-30 | Discharge: 2014-08-30 | Disposition: A | Discharge status: Home / Self Care | Payer: Medicare PPO | Source: Ambulatory Visit | Attending: Surgery | Admitting: Surgery

## 2014-08-30 DIAGNOSIS — R1030 Lower abdominal pain, unspecified: Secondary | ICD-10-CM

## 2014-09-12 ENCOUNTER — Other Ambulatory Visit (INDEPENDENT_AMBULATORY_CARE_PROVIDER_SITE_OTHER): Payer: Self-pay

## 2014-09-12 ENCOUNTER — Other Ambulatory Visit (INDEPENDENT_AMBULATORY_CARE_PROVIDER_SITE_OTHER): Payer: Self-pay | Admitting: Surgery

## 2014-09-12 DIAGNOSIS — K862 Cyst of pancreas: Secondary | ICD-10-CM

## 2014-09-26 ENCOUNTER — Encounter (HOSPITAL_COMMUNITY): Payer: Self-pay | Admitting: Emergency Medicine

## 2014-09-26 ENCOUNTER — Ambulatory Visit
Admission: RE | Admit: 2014-09-26 | Discharge: 2014-09-26 | Disposition: A | Discharge status: Home / Self Care | Payer: Medicare PPO | Source: Ambulatory Visit | Attending: Surgery | Admitting: Surgery

## 2014-09-26 ENCOUNTER — Emergency Department (HOSPITAL_COMMUNITY)
Admission: EM | Admit: 2014-09-26 | Discharge: 2014-09-26 | Disposition: A | Discharge status: Home / Self Care | Payer: Medicare PPO | Source: Home / Self Care | Attending: Family Medicine | Admitting: Family Medicine

## 2014-09-26 DIAGNOSIS — R110 Nausea: Secondary | ICD-10-CM

## 2014-09-26 DIAGNOSIS — R300 Dysuria: Secondary | ICD-10-CM

## 2014-09-26 DIAGNOSIS — K862 Cyst of pancreas: Secondary | ICD-10-CM

## 2014-09-26 LAB — POCT URINALYSIS DIP (DEVICE)
BILIRUBIN URINE: NEGATIVE
GLUCOSE, UA: NEGATIVE mg/dL
HGB URINE DIPSTICK: NEGATIVE
Ketones, ur: NEGATIVE mg/dL
LEUKOCYTES UA: NEGATIVE
Nitrite: NEGATIVE
Protein, ur: NEGATIVE mg/dL
Specific Gravity, Urine: >=1.03 (ref 1.005–1.030)
Urobilinogen, UA: 0.2 mg/dL (ref 0.0–1.0)
pH: 6 (ref 5.0–8.0)

## 2014-09-26 MED ORDER — CEPHALEXIN 500 MG PO CAPS: 500.0000 mg | ORAL_CAPSULE | Freq: Four times a day (QID) | ORAL | Status: DC

## 2014-09-26 MED ORDER — GADOBENATE DIMEGLUMINE 529 MG/ML IV SOLN: 12.0000 mL | Freq: Once | INTRAVENOUS | Status: AC | PRN

## 2014-09-26 NOTE — Discharge Instructions (Signed)
Dysuria Dysuria is the medical term for pain with urination. There are many causes for dysuria, but urinary tract infection is the most common. If a urinalysis was performed it can show that there is a urinary tract infection. A urine culture confirms that you or your child is sick. You will need to follow up with a healthcare provider because:  If a urine culture was done you will need to know the culture results and treatment recommendations.  If the urine culture was positive, you or your child will need to be put on antibiotics or know if the antibiotics prescribed are the right antibiotics for your urinary tract infection.  If the urine culture is negative (no urinary tract infection), then other causes may need to be explored or antibiotics need to be stopped. Today laboratory work may have been done and there does not seem to be an infection. If cultures were done they will take at least 24 to 48 hours to be completed. Today x-rays may have been taken and they read as normal. No cause can be found for the problems. The x-rays may be re-read by a radiologist and you will be contacted if additional findings are made. You or your child may have been put on medications to help with this problem until you can see your primary caregiver. If the problems get better, see your primary caregiver if the problems return. If you were given antibiotics (medications which kill germs), take all of the mediations as directed for the full course of treatment.  If laboratory work was done, you need to find the results. Leave a telephone number where you can be reached. If this is not possible, make sure you find out how you are to get test results. HOME CARE INSTRUCTIONS   Drink lots of fluids. For adults, drink eight, 8 ounce glasses of clear juice or water a day. For children, replace fluids as suggested by your caregiver.  Empty the bladder often. Avoid holding urine for long periods of time.  After a bowel  movement, women should cleanse front to back, using each tissue only once.  Empty your bladder before and after sexual intercourse.  Take all the medicine given to you until it is gone. You may feel better in a few days, but TAKE ALL MEDICINE.  Avoid caffeine, tea, alcohol and carbonated beverages, because they tend to irritate the bladder.  In men, alcohol may irritate the prostate.  Only take over-the-counter or prescription medicines for pain, discomfort, or fever as directed by your caregiver.  If your caregiver has given you a follow-up appointment, it is very important to keep that appointment. Not keeping the appointment could result in a chronic or permanent injury, pain, and disability. If there is any problem keeping the appointment, you must call back to this facility for assistance. SEEK IMMEDIATE MEDICAL CARE IF:   Back pain develops.  A fever develops.  There is nausea (feeling sick to your stomach) or vomiting (throwing up).  Problems are no better with medications or are getting worse. MAKE SURE YOU:   Understand these instructions.  Will watch your condition.  Will get help right away if you are not doing well or get worse. Document Released: 06/05/2004 Document Revised: 11/30/2011 Document Reviewed: 04/12/2008 [facility] Patient Information 2015 [place], [place]. This information is not intended to replace advice given to you by your health care provider. Make sure you discuss any questions you have with your health care provider.  Nausea and Vomiting  Nausea is a sick feeling that often comes before throwing up (vomiting). Vomiting is a reflex where stomach contents come out of your mouth. Vomiting can cause severe loss of body fluids (dehydration). Children and elderly adults can become dehydrated quickly, especially if they also have diarrhea. Nausea and vomiting are symptoms of a condition or disease. It is important to find the cause of your symptoms. CAUSES    Direct irritation of the stomach lining. This irritation can result from increased acid production (gastroesophageal reflux disease), infection, food poisoning, taking certain medicines (such as nonsteroidal anti-inflammatory drugs), alcohol use, or tobacco use.  Signals from the brain.These signals could be caused by a headache, heat exposure, an inner ear disturbance, increased pressure in the brain from injury, infection, a tumor, or a concussion, pain, emotional stimulus, or metabolic problems.  An obstruction in the gastrointestinal tract (bowel obstruction).  Illnesses such as diabetes, hepatitis, gallbladder problems, appendicitis, kidney problems, cancer, sepsis, atypical symptoms of a heart attack, or eating disorders.  Medical treatments such as chemotherapy and radiation.  Receiving medicine that makes you sleep (general anesthetic) during surgery. DIAGNOSIS Your caregiver may ask for tests to be done if the problems do not improve after a few days. Tests may also be done if symptoms are severe or if the reason for the nausea and vomiting is not clear. Tests may include:  Urine tests.  Blood tests.  Stool tests.  Cultures (to look for evidence of infection).  X-rays or other imaging studies. Test results can help your caregiver make decisions about treatment or the need for additional tests. TREATMENT You need to stay well hydrated. Drink frequently but in small amounts.You may wish to drink water, sports drinks, clear broth, or eat frozen ice pops or gelatin dessert to help stay hydrated.When you eat, eating slowly may help prevent nausea.There are also some antinausea medicines that may help prevent nausea. HOME CARE INSTRUCTIONS   Take all medicine as directed by your caregiver.  If you do not have an appetite, do not force yourself to eat. However, you must continue to drink fluids.  If you have an appetite, eat a normal diet unless your caregiver tells you  differently.  Eat a variety of complex carbohydrates (rice, wheat, potatoes, bread), lean meats, yogurt, fruits, and vegetables.  Avoid high-fat foods because they are more difficult to digest.  Drink enough water and fluids to keep your urine clear or pale yellow.  If you are dehydrated, ask your caregiver for specific rehydration instructions. Signs of dehydration may include:  Severe thirst.  Dry lips and mouth.  Dizziness.  Dark urine.  Decreasing urine frequency and amount.  Confusion.  Rapid breathing or pulse. SEEK IMMEDIATE MEDICAL CARE IF:   You have blood or brown flecks (like coffee grounds) in your vomit.  You have black or bloody stools.  You have a severe headache or stiff neck.  You are confused.  You have severe abdominal pain.  You have chest pain or trouble breathing.  You do not urinate at least once every 8 hours.  You develop cold or clammy skin.  You continue to vomit for longer than 24 to 48 hours.  You have a fever. MAKE SURE YOU:   Understand these instructions.  Will watch your condition.  Will get help right away if you are not doing well or get worse. Document Released: 09/07/2005 Document Revised: 11/30/2011 Document Reviewed: 02/04/2011 [facility] Patient Information 2015 [place], [place]. This information is not intended to replace  advice given to you by your health care provider. Make sure you discuss any questions you have with your health care provider.

## 2014-09-26 NOTE — ED Provider Notes (Signed)
CSN: [id number]     Arrival date & time 09/26/14  1038 History   First MD Initiated Contact with Patient 09/26/14 1143     Chief Complaint  Patient presents with  . Urinary Tract Infection   (Consider location/radiation/quality/duration/timing/severity/associated sxs/prior Treatment) HPI Comments: 79 year old female complaining of chills, nausea and dysuria for 4-5 days. She is sitting when she has had one in the past. She took some Cipro tablets at the onset of symptoms and then changed to Septra for 2 and half days. She did not get any better so she came to the urgent care.   Past Medical History  Diagnosis Date  . Cancer 11/01/2000    left breast cancer  . Hypertension   . Asthma   . Polymyalgia   . Allergy   . GERD (gastroesophageal reflux disease)    Past Surgical History  Procedure Laterality Date  . Breast surgery Left 11/01/2000    masectomy  . Cystocele repair  09/1999  . Rectocele repair  09/1999  . Abdominal hysterectomy  1975   Family History  Problem Relation Age of Onset  . Colon cancer Neg Hx   . Colon polyps Neg Hx   . Diabetes Neg Hx   . Kidney disease Neg Hx   . Esophageal cancer Neg Hx    History  Substance Use Topics  . Smoking status: Never Smoker   . Smokeless tobacco: Never Used  . Alcohol Use: No   OB History    No data available     Review of Systems  Constitutional: Positive for chills and activity change. Negative for fever.  HENT: Negative.   Respiratory: Negative.   Cardiovascular: Negative for chest pain.  Gastrointestinal: Negative for vomiting and abdominal pain.  Genitourinary: Positive for dysuria. Negative for frequency, flank pain, decreased urine volume, vaginal bleeding and pelvic pain.    Allergies  Propranolol; Codeine; and Nitrofuran derivatives  Home Medications   Prior to Admission medications   Medication Sig Start Date End Date Taking? Authorizing Provider  alendronate (FOSAMAX) 70 MG tablet Take 70 mg by mouth  once a week. 12/12/13  Yes Historical Provider, MD  benazepril (LOTENSIN) 20 MG tablet Take 40 mg by mouth daily.    Yes Historical Provider, MD  CALCIUM-VITAMIN D PO Take 600 mg by mouth 2 (two) times daily.     Yes Historical Provider, MD  carvedilol (COREG) 3.125 MG tablet Take 3.125 mg by mouth daily. 12/02/13  Yes Historical Provider, MD  Cholecalciferol (VITAMIN D3) 1000 UNITS CAPS Take 1,000 Units by mouth 2 (two) times daily.   Yes Historical Provider, MD  hydrochlorothiazide (HYDRODIURIL) 12.5 MG tablet Take 12.5 mg by mouth Daily. 08/13/12  Yes Historical Provider, MD  hydroxychloroquine (PLAQUENIL) 200 MG tablet Take 200 mg by mouth 2 (two) times daily. 09/27/12  Yes Historical Provider, MD  levothyroxine (SYNTHROID, LEVOTHROID) 125 MCG tablet Take 125 mcg by mouth daily before breakfast.   Yes Historical Provider, MD  Linaclotide (LINZESS) 290 MCG CAPS capsule Take 1 capsule (290 mcg total) by mouth daily. 07/16/14  Yes Willia Craze, NP  montelukast (SINGULAIR) 10 MG tablet Take 10 mg by mouth daily after breakfast.   Yes Historical Provider, MD  Multiple Vitamins-Minerals (PRESERVISION/LUTEIN PO) Take 1 tablet by mouth 2 (two) times daily.     Yes Historical Provider, MD  Omega-3 Fatty Acids (OMEGA 3 PO) Take 1,000 mg by mouth 2 (two) times daily.     Yes Historical Provider, MD  omeprazole ([place])  40 MG capsule Take 40 mg by mouth daily.   Yes Historical Provider, MD  ranitidine (ZANTAC) 300 MG tablet Take 300 mg by mouth at bedtime.   Yes Historical Provider, MD  simvastatin (ZOCOR) 40 MG tablet Take 40 mg by mouth at bedtime.     Yes Historical Provider, MD  cephALEXin (KEFLEX) 500 MG capsule Take 1 capsule (500 mg total) by mouth 4 (four) times daily. 09/26/14   Janne Napoleon, NP  ciprofloxacin (CIPRO) 500 MG tablet Take 1 tablet (500 mg total) by mouth 2 (two) times daily. One po bid x 7 days 08/07/14   Wandra Arthurs, MD  EPIPEN 2-PAK 0.3 MG/0.3ML SOAJ injection as needed. 11/27/13    Historical Provider, MD  HYDROcodone-acetaminophen (NORCO/VICODIN) 5-325 MG per tablet Take 1-2 tablets by mouth every 6 (six) hours as needed for moderate pain or severe pain. 08/07/14   Wandra Arthurs, MD  ondansetron (ZOFRAN ODT) 4 MG disintegrating tablet 4mg  ODT q6 hours prn nausea/vomit 08/07/14   Wandra Arthurs, MD  predniSONE (DELTASONE) 1 MG tablet Take 7 mg by mouth Daily.  09/27/12   Historical Provider, MD   BP 170/84 mmHg  Pulse 98  Temp(Src) 98.2 F (36.8 C) (Oral)  Resp 18  SpO2 97% Physical Exam  Constitutional: She is oriented to person, place, and time. She appears well-developed and well-nourished. No distress.  HENT:  Bilateral TMs are normal. Oropharynx with moderate amount of clear PND and minor injection. No exudates.  Neck: Normal range of motion. Neck supple.  Cardiovascular: Normal rate, regular rhythm and normal heart sounds.   Pulmonary/Chest: Effort normal and breath sounds normal. No respiratory distress. She has no wheezes. She has no rales.  Abdominal: Soft. There is no tenderness.  Musculoskeletal: Normal range of motion.  Lymphadenopathy:    She has no cervical adenopathy.  Neurological: She is alert and oriented to person, place, and time.  Skin: Skin is warm and dry.  Psychiatric: She has a normal mood and affect.  Nursing note and vitals reviewed.   ED Course  Procedures (including critical care time) Labs Review Labs Reviewed  URINE CULTURE  POCT URINALYSIS DIP (DEVICE)    Imaging Review Mr Abdomen W Wo Contrast  09/26/2014   CLINICAL DATA:  Follow-up pancreatic cystic lesion  EXAM: MRI ABDOMEN WITHOUT AND WITH CONTRAST  TECHNIQUE: Multiplanar multisequence MR imaging of the abdomen was performed both before and after the administration of intravenous contrast.  CONTRAST:  12 mL Multihance IV  COMPARISON:  CT abdomen pelvis dated 08/07/2014 and 07/03/2014  FINDINGS: Lower chest:  Lung bases are clear.  Hepatobiliary: 7 mm cyst in the medial segment  left hepatic lobe (series 5/image 15). Three probable hemangiomas in the right hepatic lobe measuring up to 7 mm (series 5/ image 8). No suspicious hepatic lesions. No hepatic steatosis.  Gallbladder is unremarkable. No intrahepatic or extrahepatic ductal dilatation.  Pancreas: 9 x 16 x 20 mm nonaggressive cystic lesion along the medial aspect of the pancreatic uncinate process (series 3/image 22; series 4/image 24). No enhancement following contrast administration. Definite ductal communication is difficult to confirm. No associated dilatation of the main pancreatic duct.  Spleen: Within normal limits.  Adrenals/Urinary Tract: Adrenal glands are unremarkable.  Kidneys are notable for tiny bilateral renal cysts. No hydronephrosis.  Stomach/Bowel: Stomach and visualized bowel are grossly unremarkable  Vascular/Lymphatic: No evidence of abdominal aortic aneurysm.  No suspicious abdominal lymphadenopathy.  Other: No abdominal ascites.  Musculoskeletal: No focal osseous lesions.  IMPRESSION: 2.0 cm nonaggressive cystic lesion along the pancreatic uncinate process, as described above.  Primary differential considerations include a pseudocyst, side branch IPMN, or oligocystic serous cystadenoma.  Follow-up MRI abdomen with/without contrast is suggested in 12 months.   Electronically Signed   By: Julian Hy M.D.   On: 09/26/2014 10:46     MDM   1. Dysuria   2. Nausea    Poor historian. The patient has been taking Cipro and Septra and her urine is clear. Based on the presumption that she has a UTI will treat with Keflex 4 times a day. Urine culture. She is to call her PCP in follow-up with him in 2 days. Any worsening new symptoms or problems may return. Urine culture pending.    Janne Napoleon, NP 09/26/14 1201

## 2014-09-26 NOTE — ED Notes (Signed)
C/o  Burning with urinating.  Chills.  Nausea.  And  HA.   On set 5 days ago.       No otc treatments tried.

## 2014-09-27 LAB — URINE CULTURE
Colony Count: NO GROWTH
Culture: NO GROWTH

## 2014-10-05 ENCOUNTER — Ambulatory Visit (INDEPENDENT_AMBULATORY_CARE_PROVIDER_SITE_OTHER): Payer: Medicare PPO | Admitting: Nurse Practitioner

## 2014-10-05 ENCOUNTER — Encounter: Payer: Self-pay | Admitting: Nurse Practitioner

## 2014-10-05 VITALS — BP 130/70 | HR 85 | Ht 64.0 in | Wt 140.0 lb

## 2014-10-05 DIAGNOSIS — R1030 Lower abdominal pain, unspecified: Secondary | ICD-10-CM

## 2014-10-05 DIAGNOSIS — G8929 Other chronic pain: Secondary | ICD-10-CM

## 2014-10-05 DIAGNOSIS — R1031 Right lower quadrant pain: Secondary | ICD-10-CM

## 2014-10-05 DIAGNOSIS — R1032 Left lower quadrant pain: Secondary | ICD-10-CM

## 2014-10-05 NOTE — Patient Instructions (Signed)
You have been scheduled for an MRI at [facility] on 10-19-2014 Your appointment time is 3:00 PM  Please arrive  1 1/2 hour early. Arrive at 1:30 PM.  prior to your appointment time for registration purposes. Please make certain not to have anything to eat or drink after 11:00 am  prior to your test. In addition, if you have any metal in your body, have a pacemaker or defibrillator, please be sure to let your ordering physician know. This test typically takes 45 minutes to 1 hour to complete.  We will call you with the results.

## 2014-10-07 DIAGNOSIS — R1030 Lower abdominal pain, unspecified: Secondary | ICD-10-CM | POA: Insufficient documentation

## 2014-10-07 NOTE — Progress Notes (Signed)
Agree with current assessment and plans as outlined.

## 2014-10-07 NOTE — Progress Notes (Signed)
History of Present Illness:   Patient is a 79 year old female known to Dr. Henrene Pastor. I saw her in late October for lower abdominal pain / constipation. CTscan unremarkable except for a small fluid density lesion in pancreas. She was prescribed Linzess which worked well but didn't relieve the pain. Patient ultimately underwent colonoscopy (November) which was normal. Repeat imaging for follow up on pancreatic lesion was recommended at one year.   Patient went to ED a week after her colonoscopy for evaluation of progressive lower abdominal pain. Labs were unremarkable. CT scan negative except for the stable cystic lesion of pancreas.Ultrasound suggested gallbladder sludge. A friend recommended she see Dr. Ninfa Linden at [place]. Patient was in fact seen by Dr. Ninfa Linden who obtained an MRI for further evaluation of pancreatic lesion.  2.0 cm nonaggressive cystic lesion along the pancreatic uncinate process was described. Repeat imaging recommended at one year. UGI with SBFT was also obtained an normal.   Patient continues to have constant mid lower abdominal pain (below umbilicus). No urinary symptoms. She is s/p hysterectomy. Urinalysis earlier this month was negative.   Current Medications, Allergies, Past Medical History, Past Surgical History, Family History and Social History were reviewed in [employer] record.  Studies:   Mr Abdomen W Wo Contrast  09/26/2014   CLINICAL DATA:  Follow-up pancreatic cystic lesion  EXAM: MRI ABDOMEN WITHOUT AND WITH CONTRAST  TECHNIQUE: Multiplanar multisequence MR imaging of the abdomen was performed both before and after the administration of intravenous contrast.  CONTRAST:  12 mL Multihance IV  COMPARISON:  CT abdomen pelvis dated 08/07/2014 and 07/03/2014  FINDINGS: Lower chest:  Lung bases are clear.  Hepatobiliary: 7 mm cyst in the medial segment left hepatic lobe (series 5/image 15). Three probable hemangiomas in the right hepatic lobe  measuring up to 7 mm (series 5/ image 8). No suspicious hepatic lesions. No hepatic steatosis.  Gallbladder is unremarkable. No intrahepatic or extrahepatic ductal dilatation.  Pancreas: 9 x 16 x 20 mm nonaggressive cystic lesion along the medial aspect of the pancreatic uncinate process (series 3/image 22; series 4/image 24). No enhancement following contrast administration. Definite ductal communication is difficult to confirm. No associated dilatation of the main pancreatic duct.  Spleen: Within normal limits.  Adrenals/Urinary Tract: Adrenal glands are unremarkable.  Kidneys are notable for tiny bilateral renal cysts. No hydronephrosis.  Stomach/Bowel: Stomach and visualized bowel are grossly unremarkable  Vascular/Lymphatic: No evidence of abdominal aortic aneurysm.  No suspicious abdominal lymphadenopathy.  Other: No abdominal ascites.  Musculoskeletal: No focal osseous lesions.  IMPRESSION: 2.0 cm nonaggressive cystic lesion along the pancreatic uncinate process, as described above.  Primary differential considerations include a pseudocyst, side branch IPMN, or oligocystic serous cystadenoma.  Follow-up MRI abdomen with/without contrast is suggested in 12 months.   Electronically Signed   By: Julian Hy M.D.   On: 09/26/2014 10:46    Physical Exam: General: Pleasant, well developed , white female in no acute distress Head: Normocephalic and atraumatic Eyes:  sclerae anicteric, conjunctiva pink  Ears: Normal auditory acuity Lungs: Clear throughout to auscultation Heart: Regular rate and rhythm Abdomen: Soft, non distended, mild mid lower tenderness.. No masses, no hepatomegaly. Normal bowel sounds Musculoskeletal: Symmetrical with no gross deformities  Extremities: No edema  Neurological: Alert oriented x 4, grossly nonfocal Psychological:  Alert and cooperative. Normal mood and affect  Assessment and Recommendations:   79 year old female with several month history of constant mid  lower abdominal  pain unrelated to meals, defecation or position. She has had 2 CTscan, MRI, upper GI with SBFT, ultrasound and a colonoscopy. So far, no explantion for her pain. Patient mentioned that surgeon wanted her to have a pill camera. I explained that pill camera would likely be of low yield.   I spoke with Dr. Ninfa Linden (Surgery) this am. Plan is to obtain MR enterography. If this is negative then patient may require exp lap.

## 2014-10-19 ENCOUNTER — Ambulatory Visit (HOSPITAL_COMMUNITY)
Admission: RE | Admit: 2014-10-19 | Discharge: 2014-10-19 | Disposition: A | Discharge status: Home / Self Care | Payer: Medicare PPO | Source: Ambulatory Visit | Attending: Nurse Practitioner | Admitting: Nurse Practitioner

## 2014-10-19 ENCOUNTER — Telehealth: Payer: Self-pay | Admitting: Oncology

## 2014-10-19 DIAGNOSIS — R109 Unspecified abdominal pain: Secondary | ICD-10-CM | POA: Insufficient documentation

## 2014-10-19 DIAGNOSIS — G8929 Other chronic pain: Secondary | ICD-10-CM

## 2014-10-19 DIAGNOSIS — R1032 Left lower quadrant pain: Secondary | ICD-10-CM

## 2014-10-19 DIAGNOSIS — R1031 Right lower quadrant pain: Secondary | ICD-10-CM

## 2014-10-19 LAB — POCT I-STAT CREATININE: Creatinine, Ser: 1 mg/dL (ref 0.50–1.10)

## 2014-10-19 MED ORDER — BARIUM SULFATE 0.1 % PO SUSP: 450.0000 mL | Freq: Once | ORAL | Status: DC

## 2014-10-19 MED ORDER — BARIUM SULFATE 0.1 % PO SUSP: 450.0000 mL | Freq: Three times a day (TID) | ORAL | Status: DC | PRN

## 2014-10-19 MED ORDER — GADOBENATE DIMEGLUMINE 529 MG/ML IV SOLN
15.0000 mL | Freq: Once | INTRAVENOUS | Status: AC | PRN
  Administered 2014-10-19: 12 mL via INTRAVENOUS

## 2014-10-19 NOTE — Telephone Encounter (Signed)
Lvm advising appt chg from 4/8 to 4/7 @ 12p.

## 2014-11-02 DIAGNOSIS — H43812 Vitreous degeneration, left eye: Secondary | ICD-10-CM | POA: Diagnosis not present

## 2014-11-19 DIAGNOSIS — H43812 Vitreous degeneration, left eye: Secondary | ICD-10-CM | POA: Diagnosis not present

## 2014-12-24 ENCOUNTER — Telehealth: Payer: Self-pay | Admitting: Oncology

## 2014-12-24 NOTE — Telephone Encounter (Signed)
Sent msg through my chart confirming MD visit from 04/07 to 04/05 per MD schedule.... KJ

## 2014-12-25 ENCOUNTER — Ambulatory Visit: Payer: Medicare PPO | Admitting: Oncology

## 2014-12-25 ENCOUNTER — Telehealth: Payer: Self-pay | Admitting: Oncology

## 2014-12-25 NOTE — Telephone Encounter (Signed)
Pt called back confirmed updated schedule from 04/05 to 04/11, states she did get my msg but they were out..... KJ

## 2014-12-25 NOTE — Telephone Encounter (Signed)
Lft msg for pt 04/04 and also on my chart, schedule shows 12:30 but on edit notes the appt should be 11:45. I could not override time tried calling pt this morning and still no answer and no other contact # to call her.

## 2014-12-27 ENCOUNTER — Ambulatory Visit: Payer: Medicare PPO | Admitting: Oncology

## 2014-12-28 ENCOUNTER — Ambulatory Visit: Payer: Medicare PPO | Admitting: Oncology

## 2014-12-31 ENCOUNTER — Telehealth: Payer: Self-pay | Admitting: Oncology

## 2014-12-31 ENCOUNTER — Ambulatory Visit (HOSPITAL_BASED_OUTPATIENT_CLINIC_OR_DEPARTMENT_OTHER): Payer: Medicare PPO | Admitting: Oncology

## 2014-12-31 VITALS — BP 138/75 | HR 103 | Temp 99.0°F | Resp 18 | Ht 64.0 in | Wt 143.7 lb

## 2014-12-31 DIAGNOSIS — M8580 Other specified disorders of bone density and structure, unspecified site: Secondary | ICD-10-CM | POA: Diagnosis not present

## 2014-12-31 DIAGNOSIS — Z853 Personal history of malignant neoplasm of breast: Secondary | ICD-10-CM

## 2014-12-31 DIAGNOSIS — C50912 Malignant neoplasm of unspecified site of left female breast: Secondary | ICD-10-CM

## 2014-12-31 NOTE — Progress Notes (Signed)
  [facility] OFFICE PROGRESS NOTE   Diagnosis: Breast cancer  INTERVAL HISTORY:   She returns as scheduled. She feels well. No change at the left chest wall or right breast. She is due for a mammogram this month. Mrs. Weidemann had abdominal pain several months ago. She underwent an extensive diagnostic workup by Dr. Henrene Pastor. The pain has resolved. She reports no etiology for the pain was found.  Objective:  Vital signs in last 24 hours:  Blood pressure 138/75, pulse 103, temperature 99 F (37.2 C), temperature source Oral, resp. rate 18, height 5\' 4"  (1.626 m), weight 143 lb 11.2 oz (65.182 kg), SpO2 98 %.    HEENT: Neck without mass Lymphatics: No cervical, supra-clavicular, or axillary nodes Resp: Lungs clear bilaterally Cardio: Regular rate and rhythm GI: No hepatomegaly, nontender, no mass Vascular: No leg edema Breasts: Right breast without mass. Sessoms left mastectomy with a TRAM reconstruction. No evidence for chest wall tumor recurrence.    Medications: I have reviewed the patient's current medications.  Assessment/Plan: 1.History of stage III left-sided breast cancer diagnosed in February 2002-she completed treatment on the NSABP-B. 42 study in November of 2012. She remains in clinical remission.  2. History of osteopenia-she will schedule a bone density scan this month 3. Polymyalgia rheumatica-followed by rheumatology     Disposition:  She remains in clinical remission from breast cancer. Mrs. Clontz would like to continue follow-up at the [facility]. She will be scheduled for a mammogram and bone density scan this month. She will return for an office visit in one year.  Betsy Coder, MD  12/31/2014  1:15 PM

## 2014-12-31 NOTE — Telephone Encounter (Signed)
gave and printed appt sched and avs for pt for April 2016 mammo at [facility]....and april 2017 f.u f.u with Dr. Benay Spice

## 2015-01-09 ENCOUNTER — Telehealth: Payer: Self-pay | Admitting: *Deleted

## 2015-01-09 ENCOUNTER — Other Ambulatory Visit: Payer: Self-pay | Admitting: *Deleted

## 2015-01-09 DIAGNOSIS — Z1231 Encounter for screening mammogram for malignant neoplasm of breast: Secondary | ICD-10-CM

## 2015-01-09 NOTE — Telephone Encounter (Signed)
Message from [place] in radiology at [facility] reporting pt will need a diagnostic mammogram due to distortion on recent exam. Left message on voicemail for pt to call office. Will need to inform her of need for additional testing, per Dr. Benay Spice.

## 2015-01-09 NOTE — Telephone Encounter (Signed)
Pt returned call. Informed her of possible distortion on mammogram, need for additional testing. She voiced understanding. Order faxed to [facility] radiology to contact pt with appointment. Pt has bone density scheduled for 4/22 requesting to have done same day.

## 2015-01-11 ENCOUNTER — Other Ambulatory Visit: Payer: Self-pay | Admitting: *Deleted

## 2015-01-11 DIAGNOSIS — R928 Other abnormal and inconclusive findings on diagnostic imaging of breast: Secondary | ICD-10-CM

## 2015-01-16 DIAGNOSIS — M8589 Other specified disorders of bone density and structure, multiple sites: Secondary | ICD-10-CM | POA: Diagnosis not present

## 2015-03-11 DIAGNOSIS — J309 Allergic rhinitis, unspecified: Secondary | ICD-10-CM | POA: Diagnosis not present

## 2015-03-15 DIAGNOSIS — Z6823 Body mass index (BMI) 23.0-23.9, adult: Secondary | ICD-10-CM | POA: Diagnosis not present

## 2015-03-15 DIAGNOSIS — Z139 Encounter for screening, unspecified: Secondary | ICD-10-CM | POA: Diagnosis not present

## 2015-03-15 DIAGNOSIS — M353 Polymyalgia rheumatica: Secondary | ICD-10-CM | POA: Diagnosis not present

## 2015-03-19 DIAGNOSIS — J309 Allergic rhinitis, unspecified: Secondary | ICD-10-CM | POA: Diagnosis not present

## 2015-03-28 DIAGNOSIS — J309 Allergic rhinitis, unspecified: Secondary | ICD-10-CM | POA: Diagnosis not present

## 2015-04-08 DIAGNOSIS — J309 Allergic rhinitis, unspecified: Secondary | ICD-10-CM | POA: Diagnosis not present

## 2015-04-22 DIAGNOSIS — J309 Allergic rhinitis, unspecified: Secondary | ICD-10-CM | POA: Diagnosis not present

## 2015-05-06 DIAGNOSIS — J309 Allergic rhinitis, unspecified: Secondary | ICD-10-CM | POA: Diagnosis not present

## 2015-05-13 DIAGNOSIS — J309 Allergic rhinitis, unspecified: Secondary | ICD-10-CM | POA: Diagnosis not present

## 2015-05-20 DIAGNOSIS — J309 Allergic rhinitis, unspecified: Secondary | ICD-10-CM | POA: Diagnosis not present

## 2015-05-30 DIAGNOSIS — J309 Allergic rhinitis, unspecified: Secondary | ICD-10-CM | POA: Diagnosis not present

## 2015-06-03 DIAGNOSIS — J309 Allergic rhinitis, unspecified: Secondary | ICD-10-CM | POA: Diagnosis not present

## 2015-06-14 DIAGNOSIS — E559 Vitamin D deficiency, unspecified: Secondary | ICD-10-CM | POA: Diagnosis not present

## 2015-06-14 DIAGNOSIS — E780 Pure hypercholesterolemia: Secondary | ICD-10-CM | POA: Diagnosis not present

## 2015-06-14 DIAGNOSIS — Z79899 Other long term (current) drug therapy: Secondary | ICD-10-CM | POA: Diagnosis not present

## 2015-06-18 DIAGNOSIS — Z23 Encounter for immunization: Secondary | ICD-10-CM | POA: Diagnosis not present

## 2015-06-18 DIAGNOSIS — E559 Vitamin D deficiency, unspecified: Secondary | ICD-10-CM | POA: Diagnosis not present

## 2015-06-18 DIAGNOSIS — Z6824 Body mass index (BMI) 24.0-24.9, adult: Secondary | ICD-10-CM | POA: Diagnosis not present

## 2015-06-18 DIAGNOSIS — I10 Essential (primary) hypertension: Secondary | ICD-10-CM | POA: Diagnosis not present

## 2015-06-18 DIAGNOSIS — E039 Hypothyroidism, unspecified: Secondary | ICD-10-CM | POA: Diagnosis not present

## 2015-06-18 DIAGNOSIS — Z1389 Encounter for screening for other disorder: Secondary | ICD-10-CM | POA: Diagnosis not present

## 2015-06-18 DIAGNOSIS — E780 Pure hypercholesterolemia: Secondary | ICD-10-CM | POA: Diagnosis not present

## 2015-06-18 DIAGNOSIS — Z9181 History of falling: Secondary | ICD-10-CM | POA: Diagnosis not present

## 2015-07-01 ENCOUNTER — Ambulatory Visit (INDEPENDENT_AMBULATORY_CARE_PROVIDER_SITE_OTHER): Payer: Medicare PPO

## 2015-07-01 DIAGNOSIS — N6001 Solitary cyst of right breast: Secondary | ICD-10-CM | POA: Diagnosis not present

## 2015-07-01 DIAGNOSIS — J309 Allergic rhinitis, unspecified: Secondary | ICD-10-CM | POA: Diagnosis not present

## 2015-07-01 DIAGNOSIS — R928 Other abnormal and inconclusive findings on diagnostic imaging of breast: Secondary | ICD-10-CM | POA: Diagnosis not present

## 2015-07-15 ENCOUNTER — Ambulatory Visit (INDEPENDENT_AMBULATORY_CARE_PROVIDER_SITE_OTHER): Payer: Medicare PPO | Admitting: *Deleted

## 2015-07-15 DIAGNOSIS — J309 Allergic rhinitis, unspecified: Secondary | ICD-10-CM

## 2015-07-25 DIAGNOSIS — J3089 Other allergic rhinitis: Secondary | ICD-10-CM | POA: Diagnosis not present

## 2015-07-26 DIAGNOSIS — J3081 Allergic rhinitis due to animal (cat) (dog) hair and dander: Secondary | ICD-10-CM | POA: Diagnosis not present

## 2015-08-01 ENCOUNTER — Ambulatory Visit (INDEPENDENT_AMBULATORY_CARE_PROVIDER_SITE_OTHER): Payer: Medicare PPO

## 2015-08-01 DIAGNOSIS — J309 Allergic rhinitis, unspecified: Secondary | ICD-10-CM | POA: Diagnosis not present

## 2015-08-12 ENCOUNTER — Ambulatory Visit (INDEPENDENT_AMBULATORY_CARE_PROVIDER_SITE_OTHER): Payer: Medicare PPO | Admitting: *Deleted

## 2015-08-12 DIAGNOSIS — J309 Allergic rhinitis, unspecified: Secondary | ICD-10-CM | POA: Diagnosis not present

## 2015-08-26 ENCOUNTER — Ambulatory Visit (INDEPENDENT_AMBULATORY_CARE_PROVIDER_SITE_OTHER): Payer: Medicare PPO | Admitting: *Deleted

## 2015-08-26 DIAGNOSIS — J309 Allergic rhinitis, unspecified: Secondary | ICD-10-CM

## 2015-09-09 ENCOUNTER — Ambulatory Visit (INDEPENDENT_AMBULATORY_CARE_PROVIDER_SITE_OTHER): Payer: Medicare PPO | Admitting: *Deleted

## 2015-09-09 DIAGNOSIS — J309 Allergic rhinitis, unspecified: Secondary | ICD-10-CM

## 2015-09-26 ENCOUNTER — Ambulatory Visit (INDEPENDENT_AMBULATORY_CARE_PROVIDER_SITE_OTHER): Payer: Medicare PPO

## 2015-09-26 DIAGNOSIS — J309 Allergic rhinitis, unspecified: Secondary | ICD-10-CM

## 2015-10-03 DIAGNOSIS — H3530 Unspecified macular degeneration: Secondary | ICD-10-CM | POA: Diagnosis not present

## 2015-10-03 DIAGNOSIS — H26492 Other secondary cataract, left eye: Secondary | ICD-10-CM | POA: Diagnosis not present

## 2015-10-03 DIAGNOSIS — H2511 Age-related nuclear cataract, right eye: Secondary | ICD-10-CM | POA: Diagnosis not present

## 2015-10-03 DIAGNOSIS — H353121 Nonexudative age-related macular degeneration, left eye, early dry stage: Secondary | ICD-10-CM | POA: Diagnosis not present

## 2015-10-03 DIAGNOSIS — H353212 Exudative age-related macular degeneration, right eye, with inactive choroidal neovascularization: Secondary | ICD-10-CM | POA: Diagnosis not present

## 2015-10-03 DIAGNOSIS — H2513 Age-related nuclear cataract, bilateral: Secondary | ICD-10-CM | POA: Diagnosis not present

## 2015-10-07 ENCOUNTER — Ambulatory Visit (INDEPENDENT_AMBULATORY_CARE_PROVIDER_SITE_OTHER): Payer: Medicare PPO

## 2015-10-07 DIAGNOSIS — J309 Allergic rhinitis, unspecified: Secondary | ICD-10-CM | POA: Diagnosis not present

## 2015-10-21 ENCOUNTER — Ambulatory Visit (INDEPENDENT_AMBULATORY_CARE_PROVIDER_SITE_OTHER): Payer: Medicare PPO

## 2015-10-21 DIAGNOSIS — J309 Allergic rhinitis, unspecified: Secondary | ICD-10-CM | POA: Diagnosis not present

## 2015-10-24 ENCOUNTER — Other Ambulatory Visit: Payer: Self-pay

## 2015-10-24 ENCOUNTER — Telehealth: Payer: Self-pay

## 2015-10-24 DIAGNOSIS — K862 Cyst of pancreas: Secondary | ICD-10-CM

## 2015-10-24 NOTE — Telephone Encounter (Signed)
Pt scheduled for MRI of abd at [facility] 11/06/15@9am , pt to arrive at 8:45am. Pt to be NPO 4 hours prior to exam. Pt needs to have labs prior to MRI.

## 2015-10-25 NOTE — Telephone Encounter (Signed)
Spoke with pt and she states her husband is getting ready to have surgery and she wants the appt cancelled. States she will call to reschedule the appt when she is ready to have it done.

## 2015-11-04 ENCOUNTER — Ambulatory Visit (INDEPENDENT_AMBULATORY_CARE_PROVIDER_SITE_OTHER): Payer: Medicare PPO

## 2015-11-04 DIAGNOSIS — J309 Allergic rhinitis, unspecified: Secondary | ICD-10-CM | POA: Diagnosis not present

## 2015-11-06 ENCOUNTER — Ambulatory Visit (HOSPITAL_COMMUNITY): Payer: Medicare PPO

## 2015-11-18 ENCOUNTER — Ambulatory Visit (INDEPENDENT_AMBULATORY_CARE_PROVIDER_SITE_OTHER): Payer: Medicare PPO | Admitting: *Deleted

## 2015-11-18 DIAGNOSIS — J309 Allergic rhinitis, unspecified: Secondary | ICD-10-CM | POA: Diagnosis not present

## 2015-12-02 ENCOUNTER — Ambulatory Visit (INDEPENDENT_AMBULATORY_CARE_PROVIDER_SITE_OTHER): Payer: Medicare PPO | Admitting: *Deleted

## 2015-12-02 DIAGNOSIS — J309 Allergic rhinitis, unspecified: Secondary | ICD-10-CM

## 2015-12-12 DIAGNOSIS — E7800 Pure hypercholesterolemia, unspecified: Secondary | ICD-10-CM | POA: Diagnosis not present

## 2015-12-12 DIAGNOSIS — E559 Vitamin D deficiency, unspecified: Secondary | ICD-10-CM | POA: Diagnosis not present

## 2015-12-12 DIAGNOSIS — E039 Hypothyroidism, unspecified: Secondary | ICD-10-CM | POA: Diagnosis not present

## 2015-12-17 DIAGNOSIS — E7800 Pure hypercholesterolemia, unspecified: Secondary | ICD-10-CM | POA: Diagnosis not present

## 2015-12-17 DIAGNOSIS — I10 Essential (primary) hypertension: Secondary | ICD-10-CM | POA: Diagnosis not present

## 2015-12-17 DIAGNOSIS — Z6823 Body mass index (BMI) 23.0-23.9, adult: Secondary | ICD-10-CM | POA: Diagnosis not present

## 2015-12-17 DIAGNOSIS — E039 Hypothyroidism, unspecified: Secondary | ICD-10-CM | POA: Diagnosis not present

## 2015-12-17 DIAGNOSIS — J069 Acute upper respiratory infection, unspecified: Secondary | ICD-10-CM | POA: Diagnosis not present

## 2015-12-17 DIAGNOSIS — E559 Vitamin D deficiency, unspecified: Secondary | ICD-10-CM | POA: Diagnosis not present

## 2015-12-17 DIAGNOSIS — R739 Hyperglycemia, unspecified: Secondary | ICD-10-CM | POA: Diagnosis not present

## 2015-12-23 ENCOUNTER — Ambulatory Visit (INDEPENDENT_AMBULATORY_CARE_PROVIDER_SITE_OTHER): Payer: Medicare PPO | Admitting: *Deleted

## 2015-12-23 DIAGNOSIS — J309 Allergic rhinitis, unspecified: Secondary | ICD-10-CM

## 2016-01-06 ENCOUNTER — Ambulatory Visit (HOSPITAL_BASED_OUTPATIENT_CLINIC_OR_DEPARTMENT_OTHER): Payer: Medicare PPO | Admitting: Oncology

## 2016-01-06 VITALS — BP 139/61 | HR 105 | Temp 98.1°F | Resp 18 | Ht 64.0 in | Wt 140.1 lb

## 2016-01-06 DIAGNOSIS — M8580 Other specified disorders of bone density and structure, unspecified site: Secondary | ICD-10-CM | POA: Diagnosis not present

## 2016-01-06 DIAGNOSIS — C50912 Malignant neoplasm of unspecified site of left female breast: Secondary | ICD-10-CM

## 2016-01-06 DIAGNOSIS — Z853 Personal history of malignant neoplasm of breast: Secondary | ICD-10-CM

## 2016-01-06 NOTE — Progress Notes (Signed)
  [facility] OFFICE PROGRESS NOTE   Diagnosis:  Breast cancer  INTERVAL HISTORY:    she returns as scheduled. The abdominal discomfort has resolved. She has occasional pain at the left TRAM site. She is due for a mammogram this month. She had a recent "sinus "infection.  Objective:  Vital signs in last 24 hours:  Blood pressure 139/61, pulse 105, temperature 98.1 F (36.7 C), temperature source Oral, resp. rate 18, height 5\' 4"  (1.626 m), weight 140 lb 1.6 oz (63.549 kg), SpO2 99 %.    HEENT:  Neck without mass Lymphatics:  No cervical, supraclavicular, or axillary nodes Resp:  Lungs clear bilaterally Cardio:  Regular rate and rhythm GI:  No hepatomegaly Vascular:  No leg edema   Breasts: right breast without mass, status post left mastectomy with a TRAM reconstruction. No evidence for chest wall tumor recurrence.    Medications: I have reviewed the patient's current medications.  Assessment/Plan: 1.History of stage III left-sided breast cancer diagnosed in February 2002-she completed treatment on the NSABP-B. 42 study in November of 2012. She remains in clinical remission.  2. History of osteopenia 3. Polymyalgia rheumatica-followed by rheumatology     Disposition:   [facility] remains in clinical remission from breast cancer. She will be scheduled for a mammogram later this month. She will also have a right breast ultrasound to follow-up on cysts noted last year.   She would like to continue follow-up at the [facility]. She will return for an office visit in one year.  Betsy Coder, MD  01/06/2016  12:51 PM

## 2016-01-07 DIAGNOSIS — J3089 Other allergic rhinitis: Secondary | ICD-10-CM | POA: Diagnosis not present

## 2016-01-08 DIAGNOSIS — J3081 Allergic rhinitis due to animal (cat) (dog) hair and dander: Secondary | ICD-10-CM | POA: Diagnosis not present

## 2016-01-13 ENCOUNTER — Other Ambulatory Visit: Payer: Self-pay | Admitting: *Deleted

## 2016-01-13 ENCOUNTER — Ambulatory Visit (INDEPENDENT_AMBULATORY_CARE_PROVIDER_SITE_OTHER): Payer: Medicare PPO | Admitting: *Deleted

## 2016-01-13 DIAGNOSIS — J309 Allergic rhinitis, unspecified: Secondary | ICD-10-CM | POA: Diagnosis not present

## 2016-01-13 DIAGNOSIS — Z853 Personal history of malignant neoplasm of breast: Secondary | ICD-10-CM

## 2016-01-20 DIAGNOSIS — N63 Unspecified lump in breast: Secondary | ICD-10-CM | POA: Diagnosis not present

## 2016-01-20 DIAGNOSIS — Z853 Personal history of malignant neoplasm of breast: Secondary | ICD-10-CM | POA: Diagnosis not present

## 2016-02-03 ENCOUNTER — Ambulatory Visit (INDEPENDENT_AMBULATORY_CARE_PROVIDER_SITE_OTHER): Payer: Medicare PPO | Admitting: *Deleted

## 2016-02-03 DIAGNOSIS — J309 Allergic rhinitis, unspecified: Secondary | ICD-10-CM | POA: Diagnosis not present

## 2016-02-14 DIAGNOSIS — J069 Acute upper respiratory infection, unspecified: Secondary | ICD-10-CM | POA: Diagnosis not present

## 2016-02-14 DIAGNOSIS — Z6824 Body mass index (BMI) 24.0-24.9, adult: Secondary | ICD-10-CM | POA: Diagnosis not present

## 2016-02-20 ENCOUNTER — Ambulatory Visit: Payer: Medicare PPO | Admitting: Allergy and Immunology

## 2016-02-20 ENCOUNTER — Ambulatory Visit (INDEPENDENT_AMBULATORY_CARE_PROVIDER_SITE_OTHER): Payer: Medicare PPO | Admitting: Allergy and Immunology

## 2016-02-20 ENCOUNTER — Encounter: Payer: Self-pay | Admitting: Allergy and Immunology

## 2016-02-20 VITALS — BP 128/78 | HR 102 | Temp 98.5°F | Resp 20 | Ht 65.0 in | Wt 143.0 lb

## 2016-02-20 DIAGNOSIS — J309 Allergic rhinitis, unspecified: Secondary | ICD-10-CM

## 2016-02-20 DIAGNOSIS — J019 Acute sinusitis, unspecified: Secondary | ICD-10-CM | POA: Diagnosis not present

## 2016-02-20 DIAGNOSIS — H1010 Acute atopic conjunctivitis, unspecified eye: Secondary | ICD-10-CM

## 2016-02-20 MED ORDER — AZITHROMYCIN 500 MG PO TABS: 500.0000 mg | ORAL_TABLET | Freq: Every day | ORAL | Status: DC

## 2016-02-20 MED ORDER — METHYLPREDNISOLONE ACETATE 80 MG/ML IJ SUSP
80.0000 mg | Freq: Once | INTRAMUSCULAR | Status: AC
Start: 2016-02-20 — End: 2016-02-20
  Administered 2016-02-20: 80 mg via INTRAMUSCULAR

## 2016-02-20 NOTE — Progress Notes (Signed)
Follow-up Note  Referring Provider: Leonides Sake, MD Primary Provider: Leonides Sake, MD Date of Office Visit: 02/20/2016  Subjective:   Jodi Valdez (DOB: [birth year]) is a 80 y.o. female who returns to the [facility] [place] on 02/20/2016 in re-evaluation of the following:  HPI: Jodi Valdez presents to this clinic in evaluation of her allergic rhinoconjunctivitis treated with immunotherapy. She has had a very good 6 month interval while utilizing this form of therapy and does not require the use of any other medications except an occasional antihistamine but unfortunately 2 weeks ago she's developed a scratchy throat which quickly lead to lots of postnasal drip and tickle in her throat and cough. She does not have a tremendous amount of nasal congestion or ugly nasal discharge or fever or headache although she does develop pressure behind her eyes when she coughs. Apparently her son contracted a similar type of illness. She saw her primary care doctor who treated with amoxicillin and Tessalon Perles which did not help her. She is not had any reflux issues.    Medication List           alendronate 70 MG tablet  Commonly known as:  FOSAMAX  Take 70 mg by mouth once a week.     amitriptyline 10 MG tablet  Commonly known as:  ELAVIL  Take 20 mg by mouth at bedtime.     amoxicillin 875 MG tablet  Commonly known as:  AMOXIL  Take 875 mg by mouth 2 (two) times daily. for 10 days     benazepril 40 MG tablet  Commonly known as:  LOTENSIN  Take 40 mg by mouth daily.     benzonatate 200 MG capsule  Commonly known as:  TESSALON  Take 1 capsule by mouth 3 (three) times daily as needed.     CALCIUM-VITAMIN D PO  Take 600 mg by mouth 2 (two) times daily.     EPIPEN 2-PAK 0.3 mg/0.3 mL Soaj injection  Generic drug:  EPINEPHrine  as needed.     hydrochlorothiazide 12.5 MG tablet  Commonly known as:  HYDRODIURIL  Take 12.5 mg by mouth Daily.     levothyroxine  125 MCG tablet  Commonly known as:  SYNTHROID, LEVOTHROID  Take 125 mcg by mouth daily before breakfast.     pravastatin 20 MG tablet  Commonly known as:  PRAVACHOL  Take 20 mg by mouth at bedtime.     predniSONE 5 MG tablet  Commonly known as:  DELTASONE  8 mg total dose     PRESERVISION/LUTEIN PO  Take 1 tablet by mouth 2 (two) times daily.     Vitamin D3 1000 units Caps  Take 1,000 Units by mouth 2 (two) times daily.        Past Medical History  Diagnosis Date  . Cancer ([place]) 11/01/2000    left breast cancer  . Hypertension   . Asthma   . Polymyalgia ([place])   . Allergy   . GERD (gastroesophageal reflux disease)     Past Surgical History  Procedure Laterality Date  . Breast surgery Left 11/01/2000    masectomy  . Cystocele repair  09/1999  . Rectocele repair  09/1999  . Abdominal hysterectomy  1975    Allergies  Allergen Reactions  . Propranolol Other (See Comments)    Other reaction(s): Other (See Comments) hallucinations  . Codeine Nausea And Vomiting  . Nitrofuran Derivatives Nausea And Vomiting    Review of systems negative  except as noted in HPI / PMHx or noted below:  Review of Systems  Constitutional: Negative.   HENT: Negative.   Eyes: Negative.   Respiratory: Negative.   Cardiovascular: Negative.   Gastrointestinal: Negative.   Genitourinary: Negative.   Musculoskeletal: Negative.   Skin: Negative.   Neurological: Negative.   Endo/Heme/Allergies: Negative.   Psychiatric/Behavioral: Negative.      Objective:   Filed Vitals:   02/20/16 1111  BP: 128/78  Pulse: 102  Temp: 98.5 F (36.9 C)  Resp: 20   Height: 5\' 5"  (165.1 cm)  Weight: 143 lb (64.864 kg)   Physical Exam  Constitutional: She is well-developed, well-nourished, and in no distress.  Throat clearing  HENT:  Head: Normocephalic.  Right Ear: Tympanic membrane, external ear and ear canal normal.  Left Ear: Tympanic membrane, external ear and ear canal normal.  Nose:  Nose normal. No mucosal edema or rhinorrhea.  Mouth/Throat: Uvula is midline, oropharynx is clear and moist and mucous membranes are normal. No oropharyngeal exudate.  Eyes: Conjunctivae are normal.  Neck: Trachea normal. No tracheal tenderness present. No tracheal deviation present. No thyromegaly present.  Cardiovascular: Normal rate, regular rhythm, S1 normal, S2 normal and normal heart sounds.   No murmur heard. Pulmonary/Chest: Breath sounds normal. No stridor. No respiratory distress. She has no wheezes. She has no rales.  Musculoskeletal: She exhibits no edema.  Lymphadenopathy:       Head (right side): No tonsillar adenopathy present.       Head (left side): No tonsillar adenopathy present.    She has no cervical adenopathy.  Neurological: She is alert. Gait normal.  Skin: No rash noted. She is not diaphoretic. No erythema. Nails show no clubbing.  Psychiatric: Mood and affect normal.    Diagnostics: None   Assessment and Plan:   1. Allergic rhinoconjunctivitis   2. Acute sinusitis, recurrence not specified, unspecified location     1. Depo-Medrol 80 IM delivered in clinic  2. Azithromycin 500 mg once a day for 3 days  3. OTC Mucinex DM 2 tablets twice a day  4. OTC Nasal saline several times per day  5. OTC antihistamine-Claritin/Zyrtec/Allegra one time per day  6. Continue immunotherapy and EpiPen  7. Further treatment? Reflux problem? Benazepril?  8. Return to clinic in 1 year or earlier if problem  Munachiso obviously has some form of inflammatory disease in her mid airway and I'll make the assumption that this may be secondary to a bacterial infection and treat her with azithromycin as well as anti-inflammatory agents in the form of a systemic steroid. She does have a history of LPR but this is been inactive for many years and she is also on benazepril but once again she has used this agent for several years so were not going to have her undergo any further  evaluation for these other etiologic agents that may be giving rise to her cough and throat clearing unless she remains with symptoms in the face of the therapy mentioned above. If she does well she will continue on immunotherapy and I will see her back in this clinic in 1 year.  Allena Katz, MD [place]

## 2016-02-20 NOTE — Patient Instructions (Addendum)
  1. Depo-Medrol 80 IM delivered in clinic  2. Azithromycin 500 mg once a day for 3 days  3. OTC Mucinex DM 2 tablets twice a day  4. OTC Nasal saline several times per day  5. OTC antihistamine-Claritin/Zyrtec/Allegra one time per day  6. Continue immunotherapy and EpiPen  7. Further treatment? Reflux problem? Benazepril?  8. Return to clinic in 1 year or earlier if problem

## 2016-02-24 ENCOUNTER — Ambulatory Visit (INDEPENDENT_AMBULATORY_CARE_PROVIDER_SITE_OTHER): Payer: Medicare PPO

## 2016-02-24 DIAGNOSIS — H1010 Acute atopic conjunctivitis, unspecified eye: Secondary | ICD-10-CM | POA: Diagnosis not present

## 2016-02-24 DIAGNOSIS — J309 Allergic rhinitis, unspecified: Secondary | ICD-10-CM

## 2016-02-24 DIAGNOSIS — L821 Other seborrheic keratosis: Secondary | ICD-10-CM | POA: Diagnosis not present

## 2016-02-24 DIAGNOSIS — L300 Nummular dermatitis: Secondary | ICD-10-CM | POA: Diagnosis not present

## 2016-03-16 ENCOUNTER — Ambulatory Visit (INDEPENDENT_AMBULATORY_CARE_PROVIDER_SITE_OTHER): Payer: Medicare PPO

## 2016-03-16 DIAGNOSIS — J309 Allergic rhinitis, unspecified: Secondary | ICD-10-CM

## 2016-03-16 DIAGNOSIS — H1010 Acute atopic conjunctivitis, unspecified eye: Secondary | ICD-10-CM

## 2016-04-06 ENCOUNTER — Ambulatory Visit (INDEPENDENT_AMBULATORY_CARE_PROVIDER_SITE_OTHER): Payer: Medicare PPO

## 2016-04-06 DIAGNOSIS — J309 Allergic rhinitis, unspecified: Secondary | ICD-10-CM

## 2016-04-06 DIAGNOSIS — H1010 Acute atopic conjunctivitis, unspecified eye: Secondary | ICD-10-CM | POA: Diagnosis not present

## 2016-04-10 DIAGNOSIS — S81801A Unspecified open wound, right lower leg, initial encounter: Secondary | ICD-10-CM | POA: Diagnosis not present

## 2016-04-10 DIAGNOSIS — Z6825 Body mass index (BMI) 25.0-25.9, adult: Secondary | ICD-10-CM | POA: Diagnosis not present

## 2016-04-10 DIAGNOSIS — Z139 Encounter for screening, unspecified: Secondary | ICD-10-CM | POA: Diagnosis not present

## 2016-04-10 DIAGNOSIS — Z23 Encounter for immunization: Secondary | ICD-10-CM | POA: Diagnosis not present

## 2016-04-20 ENCOUNTER — Ambulatory Visit (INDEPENDENT_AMBULATORY_CARE_PROVIDER_SITE_OTHER): Payer: Medicare PPO | Admitting: *Deleted

## 2016-04-20 DIAGNOSIS — J019 Acute sinusitis, unspecified: Secondary | ICD-10-CM

## 2016-05-07 ENCOUNTER — Ambulatory Visit (INDEPENDENT_AMBULATORY_CARE_PROVIDER_SITE_OTHER): Payer: Medicare PPO | Admitting: *Deleted

## 2016-05-07 DIAGNOSIS — J309 Allergic rhinitis, unspecified: Secondary | ICD-10-CM | POA: Diagnosis not present

## 2016-05-18 ENCOUNTER — Ambulatory Visit (INDEPENDENT_AMBULATORY_CARE_PROVIDER_SITE_OTHER): Payer: Medicare PPO | Admitting: *Deleted

## 2016-05-18 DIAGNOSIS — J309 Allergic rhinitis, unspecified: Secondary | ICD-10-CM

## 2016-06-01 ENCOUNTER — Ambulatory Visit (INDEPENDENT_AMBULATORY_CARE_PROVIDER_SITE_OTHER): Payer: Medicare PPO | Admitting: *Deleted

## 2016-06-01 DIAGNOSIS — J309 Allergic rhinitis, unspecified: Secondary | ICD-10-CM

## 2016-06-11 DIAGNOSIS — E559 Vitamin D deficiency, unspecified: Secondary | ICD-10-CM | POA: Diagnosis not present

## 2016-06-11 DIAGNOSIS — E039 Hypothyroidism, unspecified: Secondary | ICD-10-CM | POA: Diagnosis not present

## 2016-06-11 DIAGNOSIS — E7800 Pure hypercholesterolemia, unspecified: Secondary | ICD-10-CM | POA: Diagnosis not present

## 2016-06-17 DIAGNOSIS — Z6824 Body mass index (BMI) 24.0-24.9, adult: Secondary | ICD-10-CM | POA: Diagnosis not present

## 2016-06-17 DIAGNOSIS — M353 Polymyalgia rheumatica: Secondary | ICD-10-CM | POA: Diagnosis not present

## 2016-06-17 DIAGNOSIS — E039 Hypothyroidism, unspecified: Secondary | ICD-10-CM | POA: Diagnosis not present

## 2016-06-17 DIAGNOSIS — E559 Vitamin D deficiency, unspecified: Secondary | ICD-10-CM | POA: Diagnosis not present

## 2016-06-17 DIAGNOSIS — R7303 Prediabetes: Secondary | ICD-10-CM | POA: Diagnosis not present

## 2016-06-17 DIAGNOSIS — E7800 Pure hypercholesterolemia, unspecified: Secondary | ICD-10-CM | POA: Diagnosis not present

## 2016-06-17 DIAGNOSIS — I10 Essential (primary) hypertension: Secondary | ICD-10-CM | POA: Diagnosis not present

## 2016-06-22 ENCOUNTER — Ambulatory Visit (INDEPENDENT_AMBULATORY_CARE_PROVIDER_SITE_OTHER): Payer: Medicare PPO | Admitting: *Deleted

## 2016-06-22 DIAGNOSIS — J309 Allergic rhinitis, unspecified: Secondary | ICD-10-CM | POA: Diagnosis not present

## 2016-07-09 DIAGNOSIS — K5900 Constipation, unspecified: Secondary | ICD-10-CM | POA: Diagnosis not present

## 2016-07-09 DIAGNOSIS — S3992XA Unspecified injury of lower back, initial encounter: Secondary | ICD-10-CM | POA: Diagnosis not present

## 2016-07-09 DIAGNOSIS — R1084 Generalized abdominal pain: Secondary | ICD-10-CM | POA: Diagnosis not present

## 2016-07-09 DIAGNOSIS — R140 Abdominal distension (gaseous): Secondary | ICD-10-CM | POA: Diagnosis not present

## 2016-07-09 DIAGNOSIS — M545 Low back pain: Secondary | ICD-10-CM | POA: Diagnosis not present

## 2016-07-09 DIAGNOSIS — S32020A Wedge compression fracture of second lumbar vertebra, initial encounter for closed fracture: Secondary | ICD-10-CM | POA: Diagnosis not present

## 2016-07-13 ENCOUNTER — Ambulatory Visit (INDEPENDENT_AMBULATORY_CARE_PROVIDER_SITE_OTHER): Payer: Medicare PPO | Admitting: *Deleted

## 2016-07-13 DIAGNOSIS — J309 Allergic rhinitis, unspecified: Secondary | ICD-10-CM | POA: Diagnosis not present

## 2016-07-29 DIAGNOSIS — J301 Allergic rhinitis due to pollen: Secondary | ICD-10-CM | POA: Diagnosis not present

## 2016-07-30 DIAGNOSIS — J3089 Other allergic rhinitis: Secondary | ICD-10-CM | POA: Diagnosis not present

## 2016-08-03 ENCOUNTER — Ambulatory Visit (INDEPENDENT_AMBULATORY_CARE_PROVIDER_SITE_OTHER): Payer: Medicare PPO | Admitting: *Deleted

## 2016-08-03 DIAGNOSIS — J309 Allergic rhinitis, unspecified: Secondary | ICD-10-CM | POA: Diagnosis not present

## 2016-08-05 DIAGNOSIS — R000 Tachycardia, unspecified: Secondary | ICD-10-CM | POA: Diagnosis not present

## 2016-08-05 DIAGNOSIS — Z6824 Body mass index (BMI) 24.0-24.9, adult: Secondary | ICD-10-CM | POA: Diagnosis not present

## 2016-08-05 DIAGNOSIS — I519 Heart disease, unspecified: Secondary | ICD-10-CM | POA: Diagnosis not present

## 2016-08-05 DIAGNOSIS — I10 Essential (primary) hypertension: Secondary | ICD-10-CM | POA: Diagnosis not present

## 2016-08-05 DIAGNOSIS — R0602 Shortness of breath: Secondary | ICD-10-CM | POA: Diagnosis not present

## 2016-08-24 ENCOUNTER — Ambulatory Visit (INDEPENDENT_AMBULATORY_CARE_PROVIDER_SITE_OTHER): Payer: Medicare PPO

## 2016-08-24 DIAGNOSIS — J309 Allergic rhinitis, unspecified: Secondary | ICD-10-CM

## 2016-09-04 DIAGNOSIS — R000 Tachycardia, unspecified: Secondary | ICD-10-CM | POA: Diagnosis not present

## 2016-09-04 DIAGNOSIS — R0602 Shortness of breath: Secondary | ICD-10-CM | POA: Diagnosis not present

## 2016-09-04 DIAGNOSIS — Z9181 History of falling: Secondary | ICD-10-CM | POA: Diagnosis not present

## 2016-09-04 DIAGNOSIS — Z1389 Encounter for screening for other disorder: Secondary | ICD-10-CM | POA: Diagnosis not present

## 2016-09-17 ENCOUNTER — Ambulatory Visit (INDEPENDENT_AMBULATORY_CARE_PROVIDER_SITE_OTHER): Payer: Medicare PPO | Admitting: *Deleted

## 2016-09-17 DIAGNOSIS — J309 Allergic rhinitis, unspecified: Secondary | ICD-10-CM

## 2016-10-05 ENCOUNTER — Ambulatory Visit (INDEPENDENT_AMBULATORY_CARE_PROVIDER_SITE_OTHER): Payer: Medicare PPO | Admitting: *Deleted

## 2016-10-05 DIAGNOSIS — J309 Allergic rhinitis, unspecified: Secondary | ICD-10-CM | POA: Diagnosis not present

## 2016-10-10 NOTE — Addendum Note (Signed)
Addended by: Felipa Emory on: 10/10/2016 09:51 AM   Modules accepted: Orders

## 2016-10-26 ENCOUNTER — Ambulatory Visit (INDEPENDENT_AMBULATORY_CARE_PROVIDER_SITE_OTHER): Payer: Medicare PPO | Admitting: *Deleted

## 2016-10-26 DIAGNOSIS — J309 Allergic rhinitis, unspecified: Secondary | ICD-10-CM

## 2016-11-02 DIAGNOSIS — R21 Rash and other nonspecific skin eruption: Secondary | ICD-10-CM | POA: Diagnosis not present

## 2016-11-02 DIAGNOSIS — Z6823 Body mass index (BMI) 23.0-23.9, adult: Secondary | ICD-10-CM | POA: Diagnosis not present

## 2016-11-02 DIAGNOSIS — K589 Irritable bowel syndrome without diarrhea: Secondary | ICD-10-CM | POA: Diagnosis not present

## 2016-11-02 DIAGNOSIS — R6889 Other general symptoms and signs: Secondary | ICD-10-CM | POA: Diagnosis not present

## 2016-11-09 ENCOUNTER — Ambulatory Visit (INDEPENDENT_AMBULATORY_CARE_PROVIDER_SITE_OTHER): Payer: Medicare PPO | Admitting: *Deleted

## 2016-11-09 DIAGNOSIS — J309 Allergic rhinitis, unspecified: Secondary | ICD-10-CM | POA: Diagnosis not present

## 2016-11-23 ENCOUNTER — Ambulatory Visit (INDEPENDENT_AMBULATORY_CARE_PROVIDER_SITE_OTHER): Payer: Medicare PPO | Admitting: *Deleted

## 2016-11-23 DIAGNOSIS — J309 Allergic rhinitis, unspecified: Secondary | ICD-10-CM

## 2016-12-07 ENCOUNTER — Ambulatory Visit (INDEPENDENT_AMBULATORY_CARE_PROVIDER_SITE_OTHER): Payer: Medicare PPO | Admitting: *Deleted

## 2016-12-07 DIAGNOSIS — J309 Allergic rhinitis, unspecified: Secondary | ICD-10-CM | POA: Diagnosis not present

## 2016-12-17 DIAGNOSIS — E559 Vitamin D deficiency, unspecified: Secondary | ICD-10-CM | POA: Diagnosis not present

## 2016-12-17 DIAGNOSIS — H353212 Exudative age-related macular degeneration, right eye, with inactive choroidal neovascularization: Secondary | ICD-10-CM | POA: Diagnosis not present

## 2016-12-17 DIAGNOSIS — E7800 Pure hypercholesterolemia, unspecified: Secondary | ICD-10-CM | POA: Diagnosis not present

## 2016-12-17 DIAGNOSIS — H2511 Age-related nuclear cataract, right eye: Secondary | ICD-10-CM | POA: Diagnosis not present

## 2016-12-17 DIAGNOSIS — E039 Hypothyroidism, unspecified: Secondary | ICD-10-CM | POA: Diagnosis not present

## 2016-12-17 DIAGNOSIS — H353121 Nonexudative age-related macular degeneration, left eye, early dry stage: Secondary | ICD-10-CM | POA: Diagnosis not present

## 2016-12-21 ENCOUNTER — Ambulatory Visit (INDEPENDENT_AMBULATORY_CARE_PROVIDER_SITE_OTHER): Payer: Medicare PPO | Admitting: *Deleted

## 2016-12-21 DIAGNOSIS — J309 Allergic rhinitis, unspecified: Secondary | ICD-10-CM | POA: Diagnosis not present

## 2016-12-22 DIAGNOSIS — E7800 Pure hypercholesterolemia, unspecified: Secondary | ICD-10-CM | POA: Diagnosis not present

## 2016-12-22 DIAGNOSIS — E559 Vitamin D deficiency, unspecified: Secondary | ICD-10-CM | POA: Diagnosis not present

## 2016-12-22 DIAGNOSIS — G4700 Insomnia, unspecified: Secondary | ICD-10-CM | POA: Diagnosis not present

## 2016-12-22 DIAGNOSIS — E039 Hypothyroidism, unspecified: Secondary | ICD-10-CM | POA: Diagnosis not present

## 2016-12-22 DIAGNOSIS — I10 Essential (primary) hypertension: Secondary | ICD-10-CM | POA: Diagnosis not present

## 2016-12-22 DIAGNOSIS — R7303 Prediabetes: Secondary | ICD-10-CM | POA: Diagnosis not present

## 2016-12-22 DIAGNOSIS — R000 Tachycardia, unspecified: Secondary | ICD-10-CM | POA: Diagnosis not present

## 2016-12-22 DIAGNOSIS — Z6825 Body mass index (BMI) 25.0-25.9, adult: Secondary | ICD-10-CM | POA: Diagnosis not present

## 2016-12-22 DIAGNOSIS — M353 Polymyalgia rheumatica: Secondary | ICD-10-CM | POA: Diagnosis not present

## 2016-12-29 ENCOUNTER — Telehealth: Payer: Self-pay | Admitting: Oncology

## 2016-12-29 NOTE — Telephone Encounter (Signed)
Pt called to r/s 4/17 appt to 4/20 at 0800 per schedule conflicts

## 2017-01-04 ENCOUNTER — Ambulatory Visit (INDEPENDENT_AMBULATORY_CARE_PROVIDER_SITE_OTHER): Payer: Medicare PPO | Admitting: *Deleted

## 2017-01-04 DIAGNOSIS — J309 Allergic rhinitis, unspecified: Secondary | ICD-10-CM | POA: Diagnosis not present

## 2017-01-05 ENCOUNTER — Ambulatory Visit: Payer: Medicare PPO | Admitting: Oncology

## 2017-01-06 DIAGNOSIS — J3089 Other allergic rhinitis: Secondary | ICD-10-CM | POA: Diagnosis not present

## 2017-01-06 NOTE — Progress Notes (Signed)
Vials to be made on 01-07-17

## 2017-01-07 DIAGNOSIS — J3081 Allergic rhinitis due to animal (cat) (dog) hair and dander: Secondary | ICD-10-CM | POA: Diagnosis not present

## 2017-01-08 ENCOUNTER — Ambulatory Visit: Payer: Medicare PPO | Admitting: Oncology

## 2017-01-21 DIAGNOSIS — E039 Hypothyroidism, unspecified: Secondary | ICD-10-CM | POA: Diagnosis not present

## 2017-01-25 ENCOUNTER — Ambulatory Visit (INDEPENDENT_AMBULATORY_CARE_PROVIDER_SITE_OTHER): Payer: Medicare PPO | Admitting: *Deleted

## 2017-01-25 DIAGNOSIS — J309 Allergic rhinitis, unspecified: Secondary | ICD-10-CM

## 2017-01-28 DIAGNOSIS — Z853 Personal history of malignant neoplasm of breast: Secondary | ICD-10-CM | POA: Diagnosis not present

## 2017-01-28 DIAGNOSIS — R928 Other abnormal and inconclusive findings on diagnostic imaging of breast: Secondary | ICD-10-CM | POA: Diagnosis not present

## 2017-02-04 DIAGNOSIS — G4700 Insomnia, unspecified: Secondary | ICD-10-CM | POA: Diagnosis not present

## 2017-02-04 DIAGNOSIS — E785 Hyperlipidemia, unspecified: Secondary | ICD-10-CM | POA: Diagnosis not present

## 2017-02-04 DIAGNOSIS — M810 Age-related osteoporosis without current pathological fracture: Secondary | ICD-10-CM | POA: Diagnosis not present

## 2017-02-04 DIAGNOSIS — Z853 Personal history of malignant neoplasm of breast: Secondary | ICD-10-CM | POA: Diagnosis not present

## 2017-02-04 DIAGNOSIS — Z6821 Body mass index (BMI) 21.0-21.9, adult: Secondary | ICD-10-CM | POA: Diagnosis not present

## 2017-02-04 DIAGNOSIS — E039 Hypothyroidism, unspecified: Secondary | ICD-10-CM | POA: Diagnosis not present

## 2017-02-04 DIAGNOSIS — I10 Essential (primary) hypertension: Secondary | ICD-10-CM | POA: Diagnosis not present

## 2017-02-18 ENCOUNTER — Ambulatory Visit (INDEPENDENT_AMBULATORY_CARE_PROVIDER_SITE_OTHER): Payer: Medicare PPO

## 2017-02-18 DIAGNOSIS — J309 Allergic rhinitis, unspecified: Secondary | ICD-10-CM | POA: Diagnosis not present

## 2017-03-08 ENCOUNTER — Ambulatory Visit (INDEPENDENT_AMBULATORY_CARE_PROVIDER_SITE_OTHER): Payer: Medicare PPO | Admitting: *Deleted

## 2017-03-08 DIAGNOSIS — J309 Allergic rhinitis, unspecified: Secondary | ICD-10-CM | POA: Diagnosis not present

## 2017-03-29 ENCOUNTER — Ambulatory Visit (INDEPENDENT_AMBULATORY_CARE_PROVIDER_SITE_OTHER): Payer: Medicare PPO

## 2017-03-29 DIAGNOSIS — J309 Allergic rhinitis, unspecified: Secondary | ICD-10-CM

## 2017-04-19 ENCOUNTER — Ambulatory Visit (INDEPENDENT_AMBULATORY_CARE_PROVIDER_SITE_OTHER): Payer: Medicare PPO | Admitting: *Deleted

## 2017-04-19 DIAGNOSIS — J309 Allergic rhinitis, unspecified: Secondary | ICD-10-CM | POA: Diagnosis not present

## 2017-05-10 ENCOUNTER — Ambulatory Visit (INDEPENDENT_AMBULATORY_CARE_PROVIDER_SITE_OTHER): Payer: Medicare PPO | Admitting: *Deleted

## 2017-05-10 DIAGNOSIS — J309 Allergic rhinitis, unspecified: Secondary | ICD-10-CM

## 2017-05-12 DIAGNOSIS — Z9181 History of falling: Secondary | ICD-10-CM | POA: Diagnosis not present

## 2017-05-12 DIAGNOSIS — Z139 Encounter for screening, unspecified: Secondary | ICD-10-CM | POA: Diagnosis not present

## 2017-05-12 DIAGNOSIS — Z136 Encounter for screening for cardiovascular disorders: Secondary | ICD-10-CM | POA: Diagnosis not present

## 2017-05-12 DIAGNOSIS — E785 Hyperlipidemia, unspecified: Secondary | ICD-10-CM | POA: Diagnosis not present

## 2017-05-12 DIAGNOSIS — Z23 Encounter for immunization: Secondary | ICD-10-CM | POA: Diagnosis not present

## 2017-05-12 DIAGNOSIS — Z0000 Encounter for general adult medical examination without abnormal findings: Secondary | ICD-10-CM | POA: Diagnosis not present

## 2017-05-12 DIAGNOSIS — Z1389 Encounter for screening for other disorder: Secondary | ICD-10-CM | POA: Diagnosis not present

## 2017-05-13 DIAGNOSIS — D225 Melanocytic nevi of trunk: Secondary | ICD-10-CM | POA: Diagnosis not present

## 2017-05-13 DIAGNOSIS — L820 Inflamed seborrheic keratosis: Secondary | ICD-10-CM | POA: Diagnosis not present

## 2017-05-27 ENCOUNTER — Ambulatory Visit (INDEPENDENT_AMBULATORY_CARE_PROVIDER_SITE_OTHER): Payer: Medicare PPO | Admitting: *Deleted

## 2017-05-27 DIAGNOSIS — J309 Allergic rhinitis, unspecified: Secondary | ICD-10-CM | POA: Diagnosis not present

## 2017-06-07 ENCOUNTER — Ambulatory Visit (INDEPENDENT_AMBULATORY_CARE_PROVIDER_SITE_OTHER): Payer: Medicare PPO | Admitting: *Deleted

## 2017-06-07 DIAGNOSIS — J309 Allergic rhinitis, unspecified: Secondary | ICD-10-CM

## 2017-06-21 ENCOUNTER — Ambulatory Visit (INDEPENDENT_AMBULATORY_CARE_PROVIDER_SITE_OTHER): Payer: Medicare PPO | Admitting: *Deleted

## 2017-06-21 DIAGNOSIS — J309 Allergic rhinitis, unspecified: Secondary | ICD-10-CM

## 2017-06-24 DIAGNOSIS — E039 Hypothyroidism, unspecified: Secondary | ICD-10-CM | POA: Diagnosis not present

## 2017-06-24 DIAGNOSIS — I10 Essential (primary) hypertension: Secondary | ICD-10-CM | POA: Diagnosis not present

## 2017-06-24 DIAGNOSIS — E559 Vitamin D deficiency, unspecified: Secondary | ICD-10-CM | POA: Diagnosis not present

## 2017-06-24 DIAGNOSIS — E7800 Pure hypercholesterolemia, unspecified: Secondary | ICD-10-CM | POA: Diagnosis not present

## 2017-06-29 DIAGNOSIS — G4700 Insomnia, unspecified: Secondary | ICD-10-CM | POA: Diagnosis not present

## 2017-06-29 DIAGNOSIS — I10 Essential (primary) hypertension: Secondary | ICD-10-CM | POA: Diagnosis not present

## 2017-06-29 DIAGNOSIS — R300 Dysuria: Secondary | ICD-10-CM | POA: Diagnosis not present

## 2017-06-29 DIAGNOSIS — Z23 Encounter for immunization: Secondary | ICD-10-CM | POA: Diagnosis not present

## 2017-06-29 DIAGNOSIS — E039 Hypothyroidism, unspecified: Secondary | ICD-10-CM | POA: Diagnosis not present

## 2017-06-29 DIAGNOSIS — E7800 Pure hypercholesterolemia, unspecified: Secondary | ICD-10-CM | POA: Diagnosis not present

## 2017-06-29 DIAGNOSIS — E559 Vitamin D deficiency, unspecified: Secondary | ICD-10-CM | POA: Diagnosis not present

## 2017-06-29 DIAGNOSIS — R7303 Prediabetes: Secondary | ICD-10-CM | POA: Diagnosis not present

## 2017-06-29 DIAGNOSIS — R000 Tachycardia, unspecified: Secondary | ICD-10-CM | POA: Diagnosis not present

## 2017-07-05 ENCOUNTER — Ambulatory Visit (INDEPENDENT_AMBULATORY_CARE_PROVIDER_SITE_OTHER): Payer: Medicare PPO | Admitting: Allergy and Immunology

## 2017-07-05 ENCOUNTER — Encounter: Payer: Self-pay | Admitting: Allergy and Immunology

## 2017-07-05 ENCOUNTER — Ambulatory Visit (INDEPENDENT_AMBULATORY_CARE_PROVIDER_SITE_OTHER): Payer: Medicare PPO | Admitting: *Deleted

## 2017-07-05 VITALS — BP 120/60 | HR 88 | Resp 18

## 2017-07-05 DIAGNOSIS — J3089 Other allergic rhinitis: Secondary | ICD-10-CM | POA: Diagnosis not present

## 2017-07-05 DIAGNOSIS — J309 Allergic rhinitis, unspecified: Secondary | ICD-10-CM

## 2017-07-05 NOTE — Progress Notes (Signed)
Follow-up Note  Referring Provider: Leonides Sake, MD Primary Provider: Leonides Sake, MD Date of Office Visit: 07/05/2017  Subjective:   Jodi Valdez (DOB: 06/11/1936) is a 81 y.o. female who returns to the [facility] on 07/05/2017 in re-evaluation of the following:  HPI: Jodi Valdez returns to this clinic in reevaluation of her allergic rhinoconjunctivitis treated with immunotherapy. Her last visit to this clinic was June 2017.  She has had an excellent year and has been able to go through the entire year without a requirement for systemic steroid or an antibiotic to treat any type of respiratory tract issue. Currently her immunotherapy is every 2 weeks and she has not had an adverse effects secondary to this form of treatment. She rarely has a need for an antihistamine at this point in time.  Allergies as of 07/05/2017      Reactions   Propranolol Other (See Comments)   Other reaction(s): Other (See Comments) hallucinations   Codeine Nausea And Vomiting   Nitrofuran Derivatives Nausea And Vomiting      Medication List      alendronate 70 MG tablet Commonly known as:  FOSAMAX Take 70 mg by mouth once a week.   amitriptyline 10 MG tablet Commonly known as:  ELAVIL Take 20 mg by mouth at bedtime.   benazepril 40 MG tablet Commonly known as:  LOTENSIN Take 40 mg by mouth daily.   carvedilol 3.125 MG tablet Commonly known as:  COREG   EPIPEN 2-PAK 0.3 mg/0.3 mL Soaj injection Generic drug:  EPINEPHrine as needed.   hydrochlorothiazide 12.5 MG tablet Commonly known as:  HYDRODIURIL Take 12.5 mg by mouth Daily.   levothyroxine 125 MCG tablet Commonly known as:  SYNTHROID, LEVOTHROID Take 125 mcg by mouth daily before breakfast.   pravastatin 20 MG tablet Commonly known as:  PRAVACHOL Take 20 mg by mouth at bedtime.   predniSONE 5 MG tablet Commonly known as:  DELTASONE 8 mg total dose   PRESERVISION/LUTEIN PO Take 1 tablet by  mouth 2 (two) times daily.   Vitamin D3 1000 units Caps Take 1,000 Units by mouth 2 (two) times daily.       Past Medical History:  Diagnosis Date  . Allergy   . Asthma   . Cancer ([place]) 11/01/2000   left breast cancer  . GERD (gastroesophageal reflux disease)   . Hypertension   . Polymyalgia ([place])     Past Surgical History:  Procedure Laterality Date  . ABDOMINAL HYSTERECTOMY  1975  . BREAST SURGERY Left 11/01/2000   masectomy  . CYSTOCELE REPAIR  09/1999  . RECTOCELE REPAIR  09/1999    Review of systems negative except as noted in HPI / PMHx or noted below:  Review of Systems  Constitutional: Negative.   HENT: Negative.   Eyes: Negative.   Respiratory: Negative.   Cardiovascular: Negative.   Gastrointestinal: Negative.   Genitourinary: Negative.   Musculoskeletal: Negative.   Skin: Negative.   Neurological: Negative.   Endo/Heme/Allergies: Negative.   Psychiatric/Behavioral: Negative.      Objective:   Vitals:   07/05/17 1624  BP: 120/60  Pulse: 88  Resp: 18          Physical Exam  Constitutional: She is well-developed, well-nourished, and in no distress.  HENT:  Head: Normocephalic.  Right Ear: Tympanic membrane, external ear and ear canal normal.  Left Ear: Tympanic membrane, external ear and ear canal normal.  Nose: Nose normal. No mucosal  edema or rhinorrhea.  Mouth/Throat: Uvula is midline, oropharynx is clear and moist and mucous membranes are normal. No oropharyngeal exudate.  Eyes: Conjunctivae are normal.  Neck: Trachea normal. No tracheal tenderness present. No tracheal deviation present. No thyromegaly present.  Cardiovascular: Normal rate, regular rhythm, S1 normal, S2 normal and normal heart sounds.   No murmur heard. Pulmonary/Chest: Breath sounds normal. No stridor. No respiratory distress. She has no wheezes. She has no rales.  Musculoskeletal: She exhibits no edema.  Lymphadenopathy:       Head (right side): No tonsillar adenopathy  present.       Head (left side): No tonsillar adenopathy present.    She has no cervical adenopathy.  Neurological: She is alert. Gait normal.  Skin: No rash noted. She is not diaphoretic. No erythema. Nails show no clubbing.  Psychiatric: Mood and affect normal.    Diagnostics: none  Assessment and Plan:   1. Other allergic rhinitis     1. OTC antihistamine-Claritin/Zyrtec/Allegra one time per day if needed  2. Continue immunotherapy and EpiPen  3. Return to clinic in 1 year or earlier if problem  Rayla is really doing very well on immunotherapy and she will continue this form of treatment and I will see her back in this clinic in 1 year or earlier if there is a problem.  Allena Katz, MD Allergy / Immunology [place]

## 2017-07-05 NOTE — Patient Instructions (Signed)
  1. OTC antihistamine-Claritin/Zyrtec/Allegra one time per day if needed  2. Continue immunotherapy and EpiPen  3. Return to clinic in 1 year or earlier if problem

## 2017-07-26 ENCOUNTER — Ambulatory Visit (INDEPENDENT_AMBULATORY_CARE_PROVIDER_SITE_OTHER): Payer: Medicare PPO | Admitting: *Deleted

## 2017-07-26 DIAGNOSIS — J309 Allergic rhinitis, unspecified: Secondary | ICD-10-CM

## 2017-07-28 DIAGNOSIS — J3089 Other allergic rhinitis: Secondary | ICD-10-CM | POA: Diagnosis not present

## 2017-07-28 NOTE — Progress Notes (Signed)
VIALS EXP 07-28-18

## 2017-07-29 DIAGNOSIS — J3089 Other allergic rhinitis: Secondary | ICD-10-CM | POA: Diagnosis not present

## 2017-08-16 ENCOUNTER — Ambulatory Visit (INDEPENDENT_AMBULATORY_CARE_PROVIDER_SITE_OTHER): Payer: Medicare PPO | Admitting: *Deleted

## 2017-08-16 DIAGNOSIS — J309 Allergic rhinitis, unspecified: Secondary | ICD-10-CM | POA: Diagnosis not present

## 2017-09-06 ENCOUNTER — Ambulatory Visit (INDEPENDENT_AMBULATORY_CARE_PROVIDER_SITE_OTHER): Payer: Medicare PPO | Admitting: *Deleted

## 2017-09-06 DIAGNOSIS — J309 Allergic rhinitis, unspecified: Secondary | ICD-10-CM | POA: Diagnosis not present

## 2017-09-27 ENCOUNTER — Ambulatory Visit (INDEPENDENT_AMBULATORY_CARE_PROVIDER_SITE_OTHER): Payer: Medicare PPO

## 2017-09-27 DIAGNOSIS — J309 Allergic rhinitis, unspecified: Secondary | ICD-10-CM | POA: Diagnosis not present

## 2017-10-07 DIAGNOSIS — R1032 Left lower quadrant pain: Secondary | ICD-10-CM | POA: Diagnosis not present

## 2017-10-07 DIAGNOSIS — Z6821 Body mass index (BMI) 21.0-21.9, adult: Secondary | ICD-10-CM | POA: Diagnosis not present

## 2017-10-18 ENCOUNTER — Ambulatory Visit (INDEPENDENT_AMBULATORY_CARE_PROVIDER_SITE_OTHER): Payer: Medicare PPO | Admitting: *Deleted

## 2017-10-18 DIAGNOSIS — J309 Allergic rhinitis, unspecified: Secondary | ICD-10-CM

## 2017-11-08 ENCOUNTER — Ambulatory Visit (INDEPENDENT_AMBULATORY_CARE_PROVIDER_SITE_OTHER): Payer: Medicare PPO | Admitting: *Deleted

## 2017-11-08 DIAGNOSIS — J309 Allergic rhinitis, unspecified: Secondary | ICD-10-CM | POA: Diagnosis not present

## 2017-11-17 DIAGNOSIS — R0602 Shortness of breath: Secondary | ICD-10-CM | POA: Diagnosis not present

## 2017-11-25 DIAGNOSIS — R634 Abnormal weight loss: Secondary | ICD-10-CM | POA: Diagnosis not present

## 2017-11-25 DIAGNOSIS — Z6822 Body mass index (BMI) 22.0-22.9, adult: Secondary | ICD-10-CM | POA: Diagnosis not present

## 2017-11-25 DIAGNOSIS — R109 Unspecified abdominal pain: Secondary | ICD-10-CM | POA: Diagnosis not present

## 2017-11-25 DIAGNOSIS — R0602 Shortness of breath: Secondary | ICD-10-CM | POA: Diagnosis not present

## 2017-11-26 DIAGNOSIS — R634 Abnormal weight loss: Secondary | ICD-10-CM | POA: Diagnosis not present

## 2017-11-29 ENCOUNTER — Ambulatory Visit (INDEPENDENT_AMBULATORY_CARE_PROVIDER_SITE_OTHER): Payer: Medicare PPO | Admitting: *Deleted

## 2017-11-29 DIAGNOSIS — J309 Allergic rhinitis, unspecified: Secondary | ICD-10-CM

## 2017-12-09 DIAGNOSIS — I700 Atherosclerosis of aorta: Secondary | ICD-10-CM | POA: Diagnosis not present

## 2017-12-09 DIAGNOSIS — J9811 Atelectasis: Secondary | ICD-10-CM | POA: Diagnosis not present

## 2017-12-09 DIAGNOSIS — M4854XA Collapsed vertebra, not elsewhere classified, thoracic region, initial encounter for fracture: Secondary | ICD-10-CM | POA: Diagnosis not present

## 2017-12-09 DIAGNOSIS — R109 Unspecified abdominal pain: Secondary | ICD-10-CM | POA: Diagnosis not present

## 2017-12-09 DIAGNOSIS — Q438 Other specified congenital malformations of intestine: Secondary | ICD-10-CM | POA: Diagnosis not present

## 2017-12-09 DIAGNOSIS — R0602 Shortness of breath: Secondary | ICD-10-CM | POA: Diagnosis not present

## 2017-12-13 ENCOUNTER — Other Ambulatory Visit: Payer: Self-pay

## 2017-12-13 ENCOUNTER — Encounter: Payer: Self-pay | Admitting: Allergy and Immunology

## 2017-12-13 ENCOUNTER — Ambulatory Visit: Payer: Medicare PPO | Admitting: Allergy and Immunology

## 2017-12-13 VITALS — BP 122/78 | HR 88 | Resp 18

## 2017-12-13 DIAGNOSIS — R0609 Other forms of dyspnea: Secondary | ICD-10-CM | POA: Diagnosis not present

## 2017-12-13 DIAGNOSIS — J3089 Other allergic rhinitis: Secondary | ICD-10-CM

## 2017-12-13 DIAGNOSIS — K219 Gastro-esophageal reflux disease without esophagitis: Secondary | ICD-10-CM | POA: Diagnosis not present

## 2017-12-13 DIAGNOSIS — R0600 Dyspnea, unspecified: Secondary | ICD-10-CM | POA: Diagnosis not present

## 2017-12-13 MED ORDER — OMEPRAZOLE 40 MG PO CPDR: DELAYED_RELEASE_CAPSULE | ORAL | 5 refills | Status: DC

## 2017-12-13 MED ORDER — EPINEPHRINE 0.3 MG/0.3ML IJ SOAJ: INTRAMUSCULAR | 3 refills | Status: DC

## 2017-12-13 MED ORDER — RANITIDINE HCL 300 MG PO TABS: ORAL_TABLET | ORAL | 5 refills | Status: DC

## 2017-12-13 NOTE — Progress Notes (Signed)
Follow-up Note  Referring Provider: Leonides Sake, MD Primary Provider: Leonides Sake, MD Date of Office Visit: 12/13/2017  Subjective:   Jodi Valdez (DOB: 1935/11/07) is a 82 y.o. female who returns to the [facility] on 12/13/2017 in re-evaluation of the following:  HPI: Jodi Valdez returns to this clinic in reevaluation of her allergic rhinoconjunctivitis and new onset dyspnea.  She is receiving immunotherapy currently at every 2 weeks to treat allergic rhinoconjunctivitis with good response.  She has not had any adverse effect from this form of treatment.  Apparently 2 weeks ago she felt as though she had unsatisfactory breathing.  She cannot take a deep breath.  She has no coughing or sputum production or chest pain or issues with her nose or issues with her throat.  However, she has been having regurgitation on several occasions throughout this past several weeks.  She did visit with Cyndi Bender who prescribed prednisone 40 mg a day for 5 days which did nothing for her regarding her dyspnea.  She apparently had a CT scan of her chest which was normal.  Allergies as of 12/13/2017      Reactions   Propranolol Other (See Comments)   Other reaction(s): Other (See Comments) hallucinations   Codeine Nausea And Vomiting   Nitrofuran Derivatives Nausea And Vomiting      Medication List      alendronate 70 MG tablet Commonly known as:  FOSAMAX Take 70 mg by mouth once a week.   amitriptyline 10 MG tablet Commonly known as:  ELAVIL Take 20 mg by mouth at bedtime.   benazepril 40 MG tablet Commonly known as:  LOTENSIN Take 40 mg by mouth daily.   carvedilol 3.125 MG tablet Commonly known as:  COREG   EPIPEN 2-PAK 0.3 mg/0.3 mL Soaj injection Generic drug:  EPINEPHrine as needed.   hydrochlorothiazide 25 MG tablet Commonly known as:  HYDRODIURIL   levothyroxine 125 MCG tablet Commonly known as:  SYNTHROID, LEVOTHROID Take 125 mcg by  mouth daily before breakfast.   pravastatin 20 MG tablet Commonly known as:  PRAVACHOL Take 20 mg by mouth at bedtime.   predniSONE 5 MG tablet Commonly known as:  DELTASONE 8 mg total dose   predniSONE 1 MG tablet Commonly known as:  DELTASONE   PRESERVISION/LUTEIN PO Take 1 tablet by mouth 2 (two) times daily.   Vitamin D3 1000 units Caps Take 1,000 Units by mouth 2 (two) times daily.       Past Medical History:  Diagnosis Date  . Allergy   . Asthma   . Cancer ([place]) 11/01/2000   left breast cancer  . GERD (gastroesophageal reflux disease)   . Hypertension   . Polymyalgia ([place])     Past Surgical History:  Procedure Laterality Date  . ABDOMINAL HYSTERECTOMY  1975  . BREAST SURGERY Left 11/01/2000   masectomy  . CYSTOCELE REPAIR  09/1999  . RECTOCELE REPAIR  09/1999    Review of systems negative except as noted in HPI / PMHx or noted below:  Review of Systems  Constitutional: Negative.   HENT: Negative.   Eyes: Negative.   Respiratory: Negative.   Cardiovascular: Negative.   Gastrointestinal: Negative.   Genitourinary: Negative.   Musculoskeletal: Negative.   Skin: Negative.   Neurological: Negative.   Endo/Heme/Allergies: Negative.   Psychiatric/Behavioral: Negative.      Objective:   Vitals:   12/13/17 0953  BP: 122/78  Pulse: 88  Resp: 18  SpO2: 98%          Physical Exam  Constitutional: She is well-developed, well-nourished, and in no distress.  HENT:  Head: Normocephalic.  Right Ear: Tympanic membrane, external ear and ear canal normal.  Left Ear: Tympanic membrane, external ear and ear canal normal.  Nose: Nose normal. No mucosal edema or rhinorrhea.  Mouth/Throat: Uvula is midline, oropharynx is clear and moist and mucous membranes are normal. No oropharyngeal exudate.  Eyes: Conjunctivae are normal.  Neck: Trachea normal. No tracheal tenderness present. No tracheal deviation present. No thyromegaly present.  Cardiovascular: Normal  rate, regular rhythm, S1 normal, S2 normal and normal heart sounds.  No murmur heard. Pulmonary/Chest: Breath sounds normal. No stridor. No respiratory distress. She has no wheezes. She has no rales.  Musculoskeletal: She exhibits no edema.  Lymphadenopathy:       Head (right side): No tonsillar adenopathy present.       Head (left side): No tonsillar adenopathy present.    She has no cervical adenopathy.  Neurological: She is alert. Gait normal.  Skin: No rash noted. She is not diaphoretic. No erythema. Nails show no clubbing.  Psychiatric: Mood and affect normal.    Diagnostics:    Spirometry was performed and demonstrated an FEV1 of 2.16 at 104 % of predicted.  Assessment and Plan:   1. Other allergic rhinitis   2. Other form of dyspnea   3. Gastroesophageal reflux disease, esophagitis presence not specified     1. OTC antihistamine-Claritin/Zyrtec/Allegra one time per day if needed  2. Continue immunotherapy and EpiPen  3. Start treatment for reflux:   A. Omeprazole 40mg  in AM  B. Ranitidine 300mg  in PM  4. Obtain a EKG  5. Further evaluation? Contact clinic in one week.   6.  Review results of CT scan from [facility].  I do not know the cause of Donyea his dyspnea but based upon airflow studies an oxygen saturation and her vital signs in conjunction with a normal chest CT scan her cardiovascular status actually appears to be quite good.  She does have some issues related to reflux and we will treat that condition to see if this results in a decrease of her dyspnea.  We will check an EKG to make sure that there is no significant abnormality and she may require an echocardiogram if she continues to have this problem.  For her atopic disease she will continue on immunotherapy as noted above.  Further evaluation and treatment will be based upon her response to the approach noted above.  Allena Katz, MD Allergy / Immunology [place]

## 2017-12-13 NOTE — Patient Instructions (Addendum)
  1. OTC antihistamine-Claritin/Zyrtec/Allegra one time per day if needed  2. Continue immunotherapy and EpiPen  3. Start treatment for reflux:   A. Omeprazole 40mg  in AM  B. Ranitidine 300mg  in PM  4. Obtain a EKG  5. Further evaluation? Contact clinic in one week.   6.  Review results of CT scan from [facility].

## 2017-12-14 ENCOUNTER — Encounter: Payer: Self-pay | Admitting: Allergy and Immunology

## 2017-12-20 ENCOUNTER — Telehealth: Payer: Self-pay | Admitting: Allergy and Immunology

## 2017-12-20 ENCOUNTER — Ambulatory Visit (INDEPENDENT_AMBULATORY_CARE_PROVIDER_SITE_OTHER): Payer: Medicare PPO | Admitting: *Deleted

## 2017-12-20 ENCOUNTER — Telehealth: Payer: Self-pay | Admitting: *Deleted

## 2017-12-20 DIAGNOSIS — J309 Allergic rhinitis, unspecified: Secondary | ICD-10-CM | POA: Diagnosis not present

## 2017-12-20 NOTE — Telephone Encounter (Signed)
Tammy spoke with Pamala Hurry and I made her an appointment for 4 weeks.

## 2017-12-20 NOTE — Telephone Encounter (Signed)
Jodi Valdez came in to the clinic to give her update.  Jodi Valdez states she is breathing better but still does not know why this all started.  Jodi Valdez is still awaiting her EKG results.  Ladavia's husband wanted it noted that "this type of thing happens all the time and she gets better for a day or two and then comes back"

## 2017-12-20 NOTE — Telephone Encounter (Signed)
Patient came in for allergy injections today. Per Dr Neldon Mc patient was to report on how she is doing one week after visit. She advised her breathing is better.  Advised patient per Dr Neldon Mc that her EKG done at [facility] last week normal.  Also to have patient continue reflux medication for four continuous weeks then return to see him. Patient advised instructions

## 2017-12-20 NOTE — Telephone Encounter (Signed)
We discussed this issue with patient during visit today while receiving immunotherapy.  She is much better.  Her EKG was normal.  I would like for her to remain on treatment for reflux for the next 4 weeks assuming that reflux was the cause of this issue and she can return to this clinic at that point in time and we will discuss further evaluation and treatment pending her response for 4 weeks.

## 2017-12-21 ENCOUNTER — Telehealth: Payer: Self-pay | Admitting: *Deleted

## 2017-12-21 NOTE — Telephone Encounter (Signed)
Patient called and advised she was talking to friend about her breathing and the fact that it is no better that maybe she should get allery tested again. I  advised her that when I talked to her yesterday to advise her EKG results she stated that she was better but she says now that she still has symptoms.  I told her I would send message to Dr Neldon Mc to see what he advised.

## 2017-12-28 DIAGNOSIS — E039 Hypothyroidism, unspecified: Secondary | ICD-10-CM | POA: Diagnosis not present

## 2017-12-28 DIAGNOSIS — I10 Essential (primary) hypertension: Secondary | ICD-10-CM | POA: Diagnosis not present

## 2017-12-28 DIAGNOSIS — E7800 Pure hypercholesterolemia, unspecified: Secondary | ICD-10-CM | POA: Diagnosis not present

## 2017-12-28 DIAGNOSIS — R7303 Prediabetes: Secondary | ICD-10-CM | POA: Diagnosis not present

## 2018-01-03 ENCOUNTER — Ambulatory Visit (INDEPENDENT_AMBULATORY_CARE_PROVIDER_SITE_OTHER): Payer: Medicare PPO | Admitting: *Deleted

## 2018-01-03 DIAGNOSIS — J309 Allergic rhinitis, unspecified: Secondary | ICD-10-CM

## 2018-01-04 DIAGNOSIS — Z6822 Body mass index (BMI) 22.0-22.9, adult: Secondary | ICD-10-CM | POA: Diagnosis not present

## 2018-01-04 DIAGNOSIS — E039 Hypothyroidism, unspecified: Secondary | ICD-10-CM | POA: Diagnosis not present

## 2018-01-04 DIAGNOSIS — Z1231 Encounter for screening mammogram for malignant neoplasm of breast: Secondary | ICD-10-CM | POA: Diagnosis not present

## 2018-01-04 DIAGNOSIS — R7303 Prediabetes: Secondary | ICD-10-CM | POA: Diagnosis not present

## 2018-01-04 DIAGNOSIS — M353 Polymyalgia rheumatica: Secondary | ICD-10-CM | POA: Diagnosis not present

## 2018-01-04 DIAGNOSIS — I10 Essential (primary) hypertension: Secondary | ICD-10-CM | POA: Diagnosis not present

## 2018-01-04 DIAGNOSIS — E7800 Pure hypercholesterolemia, unspecified: Secondary | ICD-10-CM | POA: Diagnosis not present

## 2018-01-04 DIAGNOSIS — R0602 Shortness of breath: Secondary | ICD-10-CM | POA: Diagnosis not present

## 2018-01-17 ENCOUNTER — Ambulatory Visit (INDEPENDENT_AMBULATORY_CARE_PROVIDER_SITE_OTHER): Payer: Medicare PPO | Admitting: *Deleted

## 2018-01-17 DIAGNOSIS — J309 Allergic rhinitis, unspecified: Secondary | ICD-10-CM | POA: Diagnosis not present

## 2018-01-20 ENCOUNTER — Ambulatory Visit: Payer: Medicare PPO | Admitting: Allergy and Immunology

## 2018-01-31 ENCOUNTER — Ambulatory Visit: Payer: Medicare PPO | Admitting: Allergy and Immunology

## 2018-01-31 ENCOUNTER — Encounter: Payer: Self-pay | Admitting: Allergy and Immunology

## 2018-01-31 ENCOUNTER — Ambulatory Visit: Payer: Self-pay | Admitting: *Deleted

## 2018-01-31 VITALS — BP 138/80 | HR 92 | Resp 18

## 2018-01-31 DIAGNOSIS — J3089 Other allergic rhinitis: Secondary | ICD-10-CM

## 2018-01-31 DIAGNOSIS — R011 Cardiac murmur, unspecified: Secondary | ICD-10-CM

## 2018-01-31 DIAGNOSIS — R0609 Other forms of dyspnea: Secondary | ICD-10-CM

## 2018-01-31 DIAGNOSIS — J309 Allergic rhinitis, unspecified: Secondary | ICD-10-CM

## 2018-01-31 DIAGNOSIS — K219 Gastro-esophageal reflux disease without esophagitis: Secondary | ICD-10-CM | POA: Diagnosis not present

## 2018-01-31 NOTE — Progress Notes (Signed)
Follow-up Note  Referring Provider: Leonides Sake, MD Primary Provider: Leonides Sake, MD Date of Office Visit: 01/31/2018  Subjective:   Jodi Valdez (DOB: 11-14-1935) is a 82 y.o. female who returns to the Allergy and [place] on 01/31/2018 in re-evaluation of the following:  HPI: Jodi Valdez returns to this clinic in reevaluation of her allergic rhinoconjunctivitis and dyspnea.  I last saw her in this clinic 13 December 2017 at which point in time we started therapy for reflux assuming that her dyspnea may have been a result of that issue especially given the fact that empiric therapy with prednisone and a CT scan of her chest did not help her issue nor identify any significant abnormality respectively.  She may be somewhat better.  She has not really had any more dyspnea but she is only slightly improved.  A lot of her dyspnea appears to occur after eating and may last a few hours and appears to be an intermittent issue.  Once again she does not have any associated chest pain or other respiratory tract symptoms.  She is receiving immunotherapy every 2 weeks for upper airway issues which is going quite well without any adverse effect from the use of this therapy.   Allergies as of 01/31/2018      Reactions   Propranolol Other (See Comments)   Other reaction(s): Other (See Comments) hallucinations   Codeine Nausea And Vomiting   Nitrofuran Derivatives Nausea And Vomiting      Medication List      alendronate 70 MG tablet Commonly known as:  FOSAMAX Take 70 mg by mouth once a week.   amitriptyline 10 MG tablet Commonly known as:  ELAVIL Take 20 mg by mouth at bedtime.   benazepril 40 MG tablet Commonly known as:  LOTENSIN Take 40 mg by mouth daily.   carvedilol 3.125 MG tablet Commonly known as:  COREG   EPINEPHrine 0.3 mg/0.3 mL Soaj injection Commonly known as:  EPI-PEN Use as directed for life-threatening allergic reaction.   hydrochlorothiazide  25 MG tablet Commonly known as:  HYDRODIURIL   levothyroxine 125 MCG tablet Commonly known as:  SYNTHROID, LEVOTHROID Take 62.5 mcg by mouth daily before breakfast.   omeprazole 40 MG capsule Commonly known as:  PRILOSEC Take one capsule every morning before breakfast   pravastatin 20 MG tablet Commonly known as:  PRAVACHOL Take 20 mg by mouth at bedtime.   predniSONE 5 MG tablet Commonly known as:  DELTASONE 8 mg total dose   PRESERVISION/LUTEIN PO Take 1 tablet by mouth 2 (two) times daily.   ranitidine 300 MG tablet Commonly known as:  ZANTAC Take one tablet every evening   Vitamin D3 1000 units Caps Take 1,000 Units by mouth 2 (two) times daily.       Past Medical History:  Diagnosis Date  . Allergy   . Asthma   . Cancer ([place]) 11/01/2000   left breast cancer  . GERD (gastroesophageal reflux disease)   . Hypertension   . Polymyalgia ([place])     Past Surgical History:  Procedure Laterality Date  . ABDOMINAL HYSTERECTOMY  1975  . BREAST SURGERY Left 11/01/2000   masectomy  . CYSTOCELE REPAIR  09/1999  . RECTOCELE REPAIR  09/1999    Review of systems negative except as noted in HPI / PMHx or noted below:  Review of Systems  Constitutional: Negative.   HENT: Negative.   Eyes: Negative.   Respiratory: Negative.   Cardiovascular: Negative.  Gastrointestinal: Negative.   Genitourinary: Negative.   Musculoskeletal: Negative.   Skin: Negative.   Neurological: Negative.   Endo/Heme/Allergies: Negative.   Psychiatric/Behavioral: Negative.      Objective:   Vitals:   01/31/18 1502  BP: 138/80  Pulse: 92  Resp: 18          Physical Exam  HENT:  Head: Normocephalic.  Right Ear: Tympanic membrane, external ear and ear canal normal.  Left Ear: Tympanic membrane, external ear and ear canal normal.  Nose: Nose normal. No mucosal edema or rhinorrhea.  Mouth/Throat: Uvula is midline, oropharynx is clear and moist and mucous membranes are normal. No  oropharyngeal exudate.  Eyes: Conjunctivae are normal.  Neck: Trachea normal. No tracheal tenderness present. No tracheal deviation present. No thyromegaly present.  Cardiovascular: Normal rate, regular rhythm, S1 normal, S2 normal and normal heart sounds.  No murmur (Late systolic murmur or split P2) heard. Pulmonary/Chest: Breath sounds normal. No stridor. No respiratory distress. She has no wheezes. She has no rales.  Musculoskeletal: She exhibits no edema.  Lymphadenopathy:       Head (right side): No tonsillar adenopathy present.       Head (left side): No tonsillar adenopathy present.    She has no cervical adenopathy.  Neurological: She is alert.  Skin: No rash noted. She is not diaphoretic. No erythema. Nails show no clubbing.    Diagnostics: Results of an EKG obtained 20 December 2017 identified normal sinus rhythm, possible atrial enlargement but no other significant abnormality.  Assessment and Plan:   1. Other allergic rhinitis   2. Other form of dyspnea   3. Murmur, heart   4. Gastroesophageal reflux disease, esophagitis presence not specified     1. OTC antihistamine-Claritin/Zyrtec/Allegra one time per day if needed  2. Continue immunotherapy and EpiPen  3. Continue treatment for reflux:   A. Omeprazole 40mg  in AM  B. Ranitidine 300mg  in PM  4. Obtain 2D-ECHO for murmur and dyspnea  5. Return to clinic in Summer 2019 or earlier if problem  It is quite possible that Jodi Valdez is having an issue with reflux giving rise to some of her respiratory tract symptoms but she still remains symptomatic while consistently using aggressive therapy directed against reflux.  I would like for her to continue to treat her reflux with omeprazole and ranitidine to undergo a full 12 weeks of therapy and then see what type of response we get regarding this issue.  She does appear to have either a late systolic murmur or split P2 and I would like to further evaluate this issue with an  echocardiogram.  Allena Katz, MD Allergy / [place]

## 2018-01-31 NOTE — Patient Instructions (Addendum)
  1. OTC antihistamine-Claritin/Zyrtec/Allegra one time per day if needed  2. Continue immunotherapy and EpiPen  3. Continue treatment for reflux:   A. Omeprazole 40mg  in AM  B. Ranitidine 300mg  in PM  4. Obtain 2D-ECHO for murmur and dyspnea  5. Return to clinic in Summer 2019 oe earlier if problem

## 2018-02-01 ENCOUNTER — Telehealth: Payer: Self-pay

## 2018-02-01 ENCOUNTER — Encounter: Payer: Self-pay | Admitting: Allergy and Immunology

## 2018-02-01 DIAGNOSIS — H353121 Nonexudative age-related macular degeneration, left eye, early dry stage: Secondary | ICD-10-CM | POA: Diagnosis not present

## 2018-02-01 DIAGNOSIS — H26492 Other secondary cataract, left eye: Secondary | ICD-10-CM | POA: Diagnosis not present

## 2018-02-01 DIAGNOSIS — H2511 Age-related nuclear cataract, right eye: Secondary | ICD-10-CM | POA: Diagnosis not present

## 2018-02-01 NOTE — Telephone Encounter (Signed)
Left message for patient to call the office.  Please inform patient that she is scheduled for the ECHO 2D at [facility] on Monday, May 20th, needing to check-in at Out Patient entrance at 9:30 am. Order Form has been faxed to [facility].

## 2018-02-02 ENCOUNTER — Ambulatory Visit: Payer: Medicare PPO | Admitting: Allergy and Immunology

## 2018-02-02 NOTE — Telephone Encounter (Signed)
Patient informed. 

## 2018-02-08 DIAGNOSIS — Z1231 Encounter for screening mammogram for malignant neoplasm of breast: Secondary | ICD-10-CM | POA: Diagnosis not present

## 2018-02-08 DIAGNOSIS — R011 Cardiac murmur, unspecified: Secondary | ICD-10-CM | POA: Diagnosis not present

## 2018-02-11 ENCOUNTER — Telehealth: Payer: Self-pay | Admitting: *Deleted

## 2018-02-11 ENCOUNTER — Encounter: Payer: Self-pay | Admitting: *Deleted

## 2018-02-11 NOTE — Telephone Encounter (Signed)
Patient informed of Echocardiogram results per Dr. Neldon Mc. Referral to [facility] sent.

## 2018-02-11 NOTE — Telephone Encounter (Signed)
Appt with Dr. Agustin Cree at [facility] scheduled for June 3rd @ 3:20.

## 2018-02-17 ENCOUNTER — Ambulatory Visit (INDEPENDENT_AMBULATORY_CARE_PROVIDER_SITE_OTHER): Payer: Medicare PPO | Admitting: *Deleted

## 2018-02-17 DIAGNOSIS — J309 Allergic rhinitis, unspecified: Secondary | ICD-10-CM

## 2018-02-21 ENCOUNTER — Encounter: Payer: Self-pay | Admitting: Cardiology

## 2018-02-21 ENCOUNTER — Ambulatory Visit: Payer: Medicare PPO | Admitting: Cardiology

## 2018-02-21 VITALS — BP 116/60 | HR 95 | Ht 64.0 in | Wt 129.4 lb

## 2018-02-21 DIAGNOSIS — R0609 Other forms of dyspnea: Secondary | ICD-10-CM

## 2018-02-21 DIAGNOSIS — Z853 Personal history of malignant neoplasm of breast: Secondary | ICD-10-CM

## 2018-02-21 DIAGNOSIS — I10 Essential (primary) hypertension: Secondary | ICD-10-CM | POA: Diagnosis not present

## 2018-02-21 NOTE — Progress Notes (Signed)
Cardiology Consultation:    Date:  02/21/2018   ID:  Jodi, Valdez 11-May-1936, MRN [id number]  PCP:  Cyndi Bender, PA-C  Cardiologist:  Jenne Campus, MD   Referring MD: Jiles Prows, MD   Chief Complaint  Patient presents with  . Abnormal Echo  I have abnormal tests  History of Present Illness:    Jodi Valdez is a 82 y.o. female who is being seen today for the evaluation of abnormal tests at the request of Kozlow, Donnamarie Poag, MD.  For few weeks she has been experiencing shortness of breath is quite interesting she said she would wake up in the morning symptoms that is of the bed and she had difficulty catching full breath.  At the same time she was able to walk climb stairs with some difficulties.  She went to her allergologist.  Appropriate treatment was initiated but in spite of that she was still complaining of having some difficulties.  Interestingly about a week or 2 weeks ago things got better.  Denies have any chest pain tightness squeezing pressure burning chest now she can do whatever she wants to do in the matter-of-fact this morning she was holding some rocks with her husband.  Past Medical History:  Diagnosis Date  . Allergy   . Asthma   . Cancer ([place]) 11/01/2000   left breast cancer  . GERD (gastroesophageal reflux disease)   . Hypertension   . Polymyalgia ([place])     Past Surgical History:  Procedure Laterality Date  . ABDOMINAL HYSTERECTOMY  1975  . BREAST SURGERY Left 11/01/2000   masectomy  . CYSTOCELE REPAIR  09/1999  . RECTOCELE REPAIR  09/1999    Current Medications: Current Meds  Medication Sig  . alendronate (FOSAMAX) 70 MG tablet Take 70 mg by mouth once a week.  Marland Kitchen amitriptyline (ELAVIL) 10 MG tablet Take 20 mg by mouth at bedtime.  . benazepril (LOTENSIN) 40 MG tablet Take 40 mg by mouth daily.  . carvedilol (COREG) 3.125 MG tablet Take 3.125 mg by mouth 2 (two) times daily with a meal.   . Cholecalciferol (VITAMIN D3) 1000  UNITS CAPS Take 1,000 Units by mouth 2 (two) times daily.  Marland Kitchen EPINEPHrine 0.3 mg/0.3 mL IJ SOAJ injection Use as directed for life-threatening allergic reaction.  . hydrochlorothiazide (HYDRODIURIL) 25 MG tablet Take 25 mg by mouth daily.   Marland Kitchen levothyroxine (SYNTHROID, LEVOTHROID) 125 MCG tablet Take 62.5 mcg by mouth daily before breakfast.   . Multiple Vitamins-Minerals (PRESERVISION/LUTEIN PO) Take 1 tablet by mouth 2 (two) times daily.    . pravastatin (PRAVACHOL) 20 MG tablet Take 20 mg by mouth at bedtime.  . predniSONE (DELTASONE) 5 MG tablet 8 mg total dose     Allergies:   Propranolol; Codeine; and Nitrofuran derivatives   Social History   Socioeconomic History  . Marital status: Married    Spouse name: Not on file  . Number of children: 2  . Years of education: Not on file  . Highest education level: Not on file  Occupational History  . Occupation: Retired    Fish farm manager: RETIRED  Social Needs  . Financial resource strain: Not on file  . Food insecurity:    Worry: Not on file    Inability: Not on file  . Transportation needs:    Medical: Not on file    Non-medical: Not on file  Tobacco Use  . Smoking status: Never Smoker  . Smokeless tobacco: Never Used  Substance  and Sexual Activity  . Alcohol use: No    Alcohol/week: 0.0 oz  . Drug use: No  . Sexual activity: Not on file  Lifestyle  . Physical activity:    Days per week: Not on file    Minutes per session: Not on file  . Stress: Not on file  Relationships  . Social connections:    Talks on phone: Not on file    Gets together: Not on file    Attends religious service: Not on file    Active member of club or organization: Not on file    Attends meetings of clubs or organizations: Not on file    Relationship status: Not on file  Other Topics Concern  . Not on file  Social History Narrative  . Not on file     Family History: The patient's family history is negative for Colon cancer, Colon polyps, Diabetes,  Kidney disease, and Esophageal cancer. ROS:   Please see the history of present illness.    All 14 point review of systems negative except as described per history of present illness.  EKGs/Labs/Other Studies Reviewed:    The following studies were reviewed today: Echocardiogram showed preserved left ventricular ejection fraction 50 to 55%.  There was diastolic dysfunction in form of relaxation abnormality mild MR mild TR  EKG:  EKG is  ordered today.  The ekg ordered today demonstrates normal sinus rhythm, poor R wave progression anterior precordium nonspecific ST segment changes  Recent Labs: No results found for requested labs within last 8760 hours.  Recent Lipid Panel No results found for: CHOL, TRIG, HDL, CHOLHDL, VLDL, LDLCALC, LDLDIRECT  Physical Exam:    VS:  BP 116/60   Pulse 95   Ht 5\' 4"  (1.626 m)   Wt 129 lb 6.4 oz (58.7 kg)   SpO2 96%   BMI 22.21 kg/m     Wt Readings from Last 3 Encounters:  02/21/18 129 lb 6.4 oz (58.7 kg)  02/20/16 143 lb (64.9 kg)  01/06/16 140 lb 1.6 oz (63.5 kg)     GEN:  Well nourished, well developed in no acute distress HEENT: Normal NECK: No JVD; No carotid bruits LYMPHATICS: No lymphadenopathy CARDIAC: RRR, no murmurs, no rubs, no gallops RESPIRATORY:  Clear to auscultation without rales, wheezing or rhonchi  ABDOMEN: Soft, non-tender, non-distended MUSCULOSKELETAL:  No edema; No deformity  SKIN: Warm and dry NEUROLOGIC:  Alert and oriented x 3 PSYCHIATRIC:  Normal affect   ASSESSMENT:    1. Essential hypertension   2. Dyspnea on exertion   3. PERSONAL HX BREAST CANCER    PLAN:    In order of problems listed above:  1. Essential hypertension: Blood pressure well controlled continue present management. 2. Dyspnea on exertion: Improved dramatically no clear-cut explanation for his symptomatology at this point.  Echocardiogram showed low normal ejection fraction there was diastolic dysfunction and for muscle relaxation with  mild to which is normal for her age.  Mild MR mild TR does not create any problems. 3. History of breast CA followed by internal medicine team.  Overall she is doing much better right now.  She can do whatever she wants to do I asked her to come back to me in 3 months or sooner if she got any problems finding on the echocardiogram are benign.  I do not required any intervention for it.  Relaxation abnormality noted on her echocardiogram is normal aging process.   Medication Adjustments/Labs and Tests Ordered: Current medicines  are reviewed at length with the patient today.  Concerns regarding medicines are outlined above.  No orders of the defined types were placed in this encounter.  No orders of the defined types were placed in this encounter.   Signed, Park Liter, MD, [facility]. 02/21/2018 4:06 PM    [facility]

## 2018-02-21 NOTE — Patient Instructions (Signed)
Medication Instructions:  Your physician recommends that you continue on your current medications as directed. Please refer to the Current Medication list given to you today.   Labwork: None  Testing/Procedures: You had an EKG today.   Follow-Up: Your physician wants you to follow-up in: 3 months. You will receive a reminder letter in the mail two months in advance. If you don't receive a letter, please call our office to schedule the follow-up appointment.   If you need a refill on your cardiac medications before your next appointment, please call your pharmacy.   Thank you for choosing [facility]! Catherine Lockhart, RN [phone]    

## 2018-03-07 ENCOUNTER — Ambulatory Visit (INDEPENDENT_AMBULATORY_CARE_PROVIDER_SITE_OTHER): Payer: Medicare PPO | Admitting: *Deleted

## 2018-03-07 DIAGNOSIS — J309 Allergic rhinitis, unspecified: Secondary | ICD-10-CM

## 2018-03-16 ENCOUNTER — Encounter: Payer: Self-pay | Admitting: *Deleted

## 2018-03-16 DIAGNOSIS — J3089 Other allergic rhinitis: Secondary | ICD-10-CM | POA: Diagnosis not present

## 2018-03-16 NOTE — Progress Notes (Signed)
Maintenance vial made. Exp: 03-17-19. hv 

## 2018-03-17 DIAGNOSIS — J3081 Allergic rhinitis due to animal (cat) (dog) hair and dander: Secondary | ICD-10-CM | POA: Diagnosis not present

## 2018-03-28 ENCOUNTER — Ambulatory Visit (INDEPENDENT_AMBULATORY_CARE_PROVIDER_SITE_OTHER): Payer: Medicare PPO | Admitting: *Deleted

## 2018-03-28 DIAGNOSIS — J309 Allergic rhinitis, unspecified: Secondary | ICD-10-CM

## 2018-04-21 ENCOUNTER — Ambulatory Visit (INDEPENDENT_AMBULATORY_CARE_PROVIDER_SITE_OTHER): Payer: Medicare PPO | Admitting: *Deleted

## 2018-04-21 DIAGNOSIS — J309 Allergic rhinitis, unspecified: Secondary | ICD-10-CM

## 2018-04-25 DIAGNOSIS — N644 Mastodynia: Secondary | ICD-10-CM | POA: Diagnosis not present

## 2018-04-25 DIAGNOSIS — Z6823 Body mass index (BMI) 23.0-23.9, adult: Secondary | ICD-10-CM | POA: Diagnosis not present

## 2018-04-28 ENCOUNTER — Other Ambulatory Visit: Payer: Self-pay | Admitting: Family Medicine

## 2018-04-28 DIAGNOSIS — N644 Mastodynia: Secondary | ICD-10-CM

## 2018-04-29 ENCOUNTER — Ambulatory Visit
Admission: RE | Admit: 2018-04-29 | Discharge: 2018-04-29 | Disposition: A | Discharge status: Home / Self Care | Payer: Medicare PPO | Source: Ambulatory Visit | Attending: Family Medicine | Admitting: Family Medicine

## 2018-04-29 ENCOUNTER — Ambulatory Visit: Admission: RE | Admit: 2018-04-29 | Payer: Medicare PPO | Source: Ambulatory Visit

## 2018-04-29 ENCOUNTER — Other Ambulatory Visit: Payer: Self-pay | Admitting: Nurse Practitioner

## 2018-04-29 DIAGNOSIS — N644 Mastodynia: Secondary | ICD-10-CM

## 2018-04-29 DIAGNOSIS — R922 Inconclusive mammogram: Secondary | ICD-10-CM | POA: Diagnosis not present

## 2018-04-29 HISTORY — DX: Personal history of irradiation: Z92.3

## 2018-04-29 HISTORY — DX: Personal history of antineoplastic chemotherapy: Z92.21

## 2018-05-02 ENCOUNTER — Other Ambulatory Visit: Payer: Medicare PPO

## 2018-05-09 ENCOUNTER — Ambulatory Visit (INDEPENDENT_AMBULATORY_CARE_PROVIDER_SITE_OTHER): Payer: Medicare PPO | Admitting: *Deleted

## 2018-05-09 DIAGNOSIS — J309 Allergic rhinitis, unspecified: Secondary | ICD-10-CM | POA: Diagnosis not present

## 2018-05-20 DIAGNOSIS — N390 Urinary tract infection, site not specified: Secondary | ICD-10-CM | POA: Diagnosis not present

## 2018-05-20 DIAGNOSIS — Z6823 Body mass index (BMI) 23.0-23.9, adult: Secondary | ICD-10-CM | POA: Diagnosis not present

## 2018-05-20 DIAGNOSIS — R300 Dysuria: Secondary | ICD-10-CM | POA: Diagnosis not present

## 2018-05-28 DIAGNOSIS — N3000 Acute cystitis without hematuria: Secondary | ICD-10-CM | POA: Diagnosis not present

## 2018-05-28 DIAGNOSIS — R300 Dysuria: Secondary | ICD-10-CM | POA: Diagnosis not present

## 2018-05-30 ENCOUNTER — Ambulatory Visit (INDEPENDENT_AMBULATORY_CARE_PROVIDER_SITE_OTHER): Payer: Medicare PPO | Admitting: *Deleted

## 2018-05-30 DIAGNOSIS — J309 Allergic rhinitis, unspecified: Secondary | ICD-10-CM | POA: Diagnosis not present

## 2018-06-03 DIAGNOSIS — R3911 Hesitancy of micturition: Secondary | ICD-10-CM | POA: Diagnosis not present

## 2018-06-03 DIAGNOSIS — N3020 Other chronic cystitis without hematuria: Secondary | ICD-10-CM | POA: Diagnosis not present

## 2018-06-16 DIAGNOSIS — Z136 Encounter for screening for cardiovascular disorders: Secondary | ICD-10-CM | POA: Diagnosis not present

## 2018-06-16 DIAGNOSIS — Z139 Encounter for screening, unspecified: Secondary | ICD-10-CM | POA: Diagnosis not present

## 2018-06-16 DIAGNOSIS — E785 Hyperlipidemia, unspecified: Secondary | ICD-10-CM | POA: Diagnosis not present

## 2018-06-16 DIAGNOSIS — Z9181 History of falling: Secondary | ICD-10-CM | POA: Diagnosis not present

## 2018-06-16 DIAGNOSIS — Z1339 Encounter for screening examination for other mental health and behavioral disorders: Secondary | ICD-10-CM | POA: Diagnosis not present

## 2018-06-16 DIAGNOSIS — Z0000 Encounter for general adult medical examination without abnormal findings: Secondary | ICD-10-CM | POA: Diagnosis not present

## 2018-06-16 DIAGNOSIS — Z1331 Encounter for screening for depression: Secondary | ICD-10-CM | POA: Diagnosis not present

## 2018-06-16 DIAGNOSIS — Z23 Encounter for immunization: Secondary | ICD-10-CM | POA: Diagnosis not present

## 2018-06-20 ENCOUNTER — Ambulatory Visit (INDEPENDENT_AMBULATORY_CARE_PROVIDER_SITE_OTHER): Payer: Medicare PPO | Admitting: *Deleted

## 2018-06-20 DIAGNOSIS — J309 Allergic rhinitis, unspecified: Secondary | ICD-10-CM

## 2018-06-20 DIAGNOSIS — R3911 Hesitancy of micturition: Secondary | ICD-10-CM | POA: Diagnosis not present

## 2018-06-20 DIAGNOSIS — N3020 Other chronic cystitis without hematuria: Secondary | ICD-10-CM | POA: Diagnosis not present

## 2018-06-22 DIAGNOSIS — Z23 Encounter for immunization: Secondary | ICD-10-CM | POA: Diagnosis not present

## 2018-06-29 ENCOUNTER — Encounter: Payer: Self-pay | Admitting: Gastroenterology

## 2018-06-29 ENCOUNTER — Ambulatory Visit: Payer: Medicare PPO | Admitting: Gastroenterology

## 2018-06-29 VITALS — BP 128/70 | HR 91 | Ht 64.0 in | Wt 129.0 lb

## 2018-06-29 DIAGNOSIS — D490 Neoplasm of unspecified behavior of digestive system: Secondary | ICD-10-CM

## 2018-06-29 DIAGNOSIS — R194 Change in bowel habit: Secondary | ICD-10-CM

## 2018-06-29 NOTE — Patient Instructions (Addendum)
If you are age 80 or older, your body mass index should be between 23-30. Your Body mass index is 22.14 kg/m. If this is out of the aforementioned range listed, please consider follow up with your Primary Care Provider.  If you are age 64 or younger, your body mass index should be between 19-25. Your Body mass index is 22.14 kg/m. If this is out of the aformentioned range listed, please consider follow up with your Primary Care Provider.   Stop taking fish oil x 2 weeks  Start taking calcium once daily.   Thank you,  Dr. Jackquline Denmark

## 2018-06-29 NOTE — Progress Notes (Signed)
Chief Complaint:   Referring Provider:  Cyndi Bender, PA-C      ASSESSMENT AND PLAN;   #1.  Change in bowel habits.  Now with mushy stools.  History of constipation. Neg colon 07/2014, neg CT 11/2017. #2.  Stable 1.3 cm IPMN, uncinate process without any worrisome factors/no ductal dilatation. (CT 11/2017, MRI 09/2014, CT 06/2014)  Plan: - Hold off fish oil x 2 weeks. - Add calcium 1/day - Call in 2 weeks. - X-ray KUB if still with problems. Pt's husband Broadus John would call us.  All the contact numbers were given.  I have also reviewed the CT scan report with the patient and patient's husband. - I have offered repeat MRI for follow-up of IPMN.  Patient would like to hold off.   HPI:    Jodi Valdez is a 82 y.o. female  occ mushy stools x 1 week.  History of significant constipation requiring MiraLAX in the past.  She has stopped taking MiraLAX. Neg colon 2015 Dr Henrene Pastor -reviewed with the patient and patient's husband. panc cyst stable since 2015 -she would like to hold off on any further evaluation.  Had multiple MRIs previously.  No ductal dilatation No weight loss No abdominal pain Denies having any upper GI symptoms including heartburn, regurgitation, odynophagia or dysphagia. No melena or hematochezia   Past Medical History:  Diagnosis Date  . Allergy   . Asthma   . Cancer ([place]) 11/01/2000   left breast cancer  . GERD (gastroesophageal reflux disease)   . Hypertension   . Personal history of chemotherapy    2002  . Personal history of radiation therapy    2002  . Polymyalgia ([place])     Past Surgical History:  Procedure Laterality Date  . ABDOMINAL HYSTERECTOMY  1975  . BREAST SURGERY Left 11/01/2000   masectomy  . COLONOSCOPY  2015  . CYSTOCELE REPAIR  09/1999  . MASTECTOMY Left    2002 with tramflap  . RECTOCELE REPAIR  09/1999    Family History  Problem Relation Age of Onset  . Colon cancer Neg Hx   . Colon polyps Neg Hx   . Diabetes Neg Hx     . Kidney disease Neg Hx   . Esophageal cancer Neg Hx     Social History   Tobacco Use  . Smoking status: Never Smoker  . Smokeless tobacco: Never Used  Substance Use Topics  . Alcohol use: No    Alcohol/week: 0.0 standard drinks  . Drug use: No    Current Outpatient Medications  Medication Sig Dispense Refill  . alendronate (FOSAMAX) 70 MG tablet Take 70 mg by mouth once a week.    Marland Kitchen amitriptyline (ELAVIL) 10 MG tablet Take 10 mg by mouth at bedtime.     . benazepril (LOTENSIN) 40 MG tablet Take 40 mg by mouth daily.    . carvedilol (COREG) 3.125 MG tablet Take 3.125 mg by mouth 2 (two) times daily with a meal.     . Cholecalciferol (VITAMIN D3) 1000 UNITS CAPS Take 1,000 Units by mouth 2 (two) times daily.    Marland Kitchen EPINEPHrine 0.3 mg/0.3 mL IJ SOAJ injection Use as directed for life-threatening allergic reaction. 2 Device 3  . hydrochlorothiazide (HYDRODIURIL) 25 MG tablet Take 25 mg by mouth daily.     Marland Kitchen levothyroxine (SYNTHROID, LEVOTHROID) 125 MCG tablet Take 62.5 mcg by mouth daily before breakfast.     . Multiple Vitamins-Minerals (PRESERVISION/LUTEIN PO) Take 1 tablet by mouth 2 (  two) times daily.      . Omega-3 Fatty Acids (FISH OIL) 1000 MG CAPS Take 2 capsules by mouth daily.     . pravastatin (PRAVACHOL) 20 MG tablet Take 20 mg by mouth at bedtime.    . predniSONE (DELTASONE) 5 MG tablet Take 8 mg by mouth daily. 8 mg total dose  0   No current facility-administered medications for this visit.     Allergies  Allergen Reactions  . Propranolol Other (See Comments)    Other reaction(s): Other (See Comments) hallucinations  . Codeine Nausea And Vomiting  . Nitrofuran Derivatives Nausea And Vomiting    Review of Systems:  Constitutional: Denies fever, chills, diaphoresis, appetite change and fatigue.  HEENT: Denies photophobia, eye pain, redness, hearing loss, ear pain, congestion, sore throat, rhinorrhea, sneezing, mouth sores, neck pain, neck stiffness and tinnitus.    Respiratory: Denies SOB, DOE, cough, chest tightness,  and wheezing.   Cardiovascular: Denies chest pain, palpitations and leg swelling.  Genitourinary: Denies dysuria, urgency, frequency, hematuria, flank pain and difficulty urinating.  Musculoskeletal: Denies myalgias, back pain, joint swelling, arthralgias and gait problem.  Skin: No rash.  Neurological: Denies dizziness, seizures, syncope, weakness, light-headedness, numbness and headaches.  Hematological: Denies adenopathy. Easy bruising, personal or family bleeding history  Psychiatric/Behavioral: No anxiety or depression     Physical Exam:    BP 128/70   Pulse 91   Ht 5\' 4"  (1.626 m)   Wt 129 lb (58.5 kg)   BMI 22.14 kg/m  Filed Weights   06/29/18 1433  Weight: 129 lb (58.5 kg)   Constitutional:  Well-developed, in no acute distress. Psychiatric: Normal mood and affect. Behavior is normal. HEENT: Pupils normal.  Conjunctivae are normal. No scleral icterus. Neck supple.  Cardiovascular: Normal rate, regular rhythm. No edema Pulmonary/chest: Effort normal and breath sounds normal. No wheezing, rales or rhonchi. Abdominal: Soft, nondistended. Nontender. Bowel sounds active throughout. There are no masses palpable. No hepatomegaly. Rectal:  defered Neurological: Alert and oriented to person place and time. Skin: Skin is warm and dry. No rashes noted.  Data Reviewed: I have personally reviewed following labs and imaging studies  CBC: CBC Latest Ref Rng & Units 08/07/2014 08/11/2008  WBC 4.0 - 10.5 K/uL 8.8 7.4  Hemoglobin 12.0 - 15.0 g/dL 13.9 12.1  Hematocrit 36.0 - 46.0 % 41.7 35.8(L)  Platelets 150 - 400 K/uL 279 215    CMP: CMP Latest Ref Rng & Units 10/19/2014 08/07/2014 08/11/2008  Glucose 70 - 99 mg/dL - 114(H) 95  BUN 6 - 23 mg/dL - 17 18  Creatinine 0.50 - 1.10 mg/dL 1.00 0.95 0.62  Sodium 137 - 147 mEq/L - 139 140  Potassium 3.7 - 5.3 mEq/L - 3.8 4.4  Chloride 96 - 112 mEq/L - 95(L) 108  CO2 19 - 32  mEq/L - 31 27  Calcium 8.4 - 10.5 mg/dL - 9.9 9.1  Total Protein 6.0 - 8.3 g/dL - 7.1 -  Total Bilirubin 0.3 - 1.2 mg/dL - 0.4 -  Alkaline Phos 39 - 117 U/L - 48 -  AST 0 - 37 U/L - 16 -  ALT 0 - 35 U/L - 25 -      Carmell Austria, MD 06/29/2018, 2:51 PM  Cc: Cyndi Bender, PA-C

## 2018-07-05 ENCOUNTER — Encounter: Payer: Self-pay | Admitting: Gastroenterology

## 2018-07-05 DIAGNOSIS — E039 Hypothyroidism, unspecified: Secondary | ICD-10-CM | POA: Diagnosis not present

## 2018-07-05 DIAGNOSIS — R7303 Prediabetes: Secondary | ICD-10-CM | POA: Diagnosis not present

## 2018-07-05 DIAGNOSIS — I10 Essential (primary) hypertension: Secondary | ICD-10-CM | POA: Diagnosis not present

## 2018-07-05 DIAGNOSIS — E7800 Pure hypercholesterolemia, unspecified: Secondary | ICD-10-CM | POA: Diagnosis not present

## 2018-07-06 DIAGNOSIS — N3020 Other chronic cystitis without hematuria: Secondary | ICD-10-CM | POA: Diagnosis not present

## 2018-07-06 DIAGNOSIS — N3090 Cystitis, unspecified without hematuria: Secondary | ICD-10-CM | POA: Diagnosis not present

## 2018-07-08 DIAGNOSIS — Z6823 Body mass index (BMI) 23.0-23.9, adult: Secondary | ICD-10-CM | POA: Diagnosis not present

## 2018-07-08 DIAGNOSIS — I10 Essential (primary) hypertension: Secondary | ICD-10-CM | POA: Diagnosis not present

## 2018-07-08 DIAGNOSIS — M8580 Other specified disorders of bone density and structure, unspecified site: Secondary | ICD-10-CM | POA: Diagnosis not present

## 2018-07-08 DIAGNOSIS — R7303 Prediabetes: Secondary | ICD-10-CM | POA: Diagnosis not present

## 2018-07-08 DIAGNOSIS — E039 Hypothyroidism, unspecified: Secondary | ICD-10-CM | POA: Diagnosis not present

## 2018-07-08 DIAGNOSIS — E7800 Pure hypercholesterolemia, unspecified: Secondary | ICD-10-CM | POA: Diagnosis not present

## 2018-07-08 DIAGNOSIS — M353 Polymyalgia rheumatica: Secondary | ICD-10-CM | POA: Diagnosis not present

## 2018-07-11 ENCOUNTER — Ambulatory Visit (INDEPENDENT_AMBULATORY_CARE_PROVIDER_SITE_OTHER): Payer: Medicare PPO | Admitting: *Deleted

## 2018-07-11 DIAGNOSIS — J309 Allergic rhinitis, unspecified: Secondary | ICD-10-CM | POA: Diagnosis not present

## 2018-07-12 ENCOUNTER — Ambulatory Visit: Payer: Medicare PPO | Admitting: Gastroenterology

## 2018-07-12 ENCOUNTER — Encounter: Payer: Self-pay | Admitting: Gastroenterology

## 2018-07-12 VITALS — BP 132/72 | HR 93 | Ht 64.0 in | Wt 130.2 lb

## 2018-07-12 DIAGNOSIS — D649 Anemia, unspecified: Secondary | ICD-10-CM

## 2018-07-12 DIAGNOSIS — R194 Change in bowel habit: Secondary | ICD-10-CM

## 2018-07-12 DIAGNOSIS — D490 Neoplasm of unspecified behavior of digestive system: Secondary | ICD-10-CM | POA: Diagnosis not present

## 2018-07-12 NOTE — Patient Instructions (Addendum)
If you are age 80 or older, your body mass index should be between 23-30. Your Body mass index is 22.36 kg/m. If this is out of the aforementioned range listed, please consider follow up with your Primary Care Provider.  If you are age 48 or younger, your body mass index should be between 19-25. Your Body mass index is 22.36 kg/m. If this is out of the aformentioned range listed, please consider follow up with your Primary Care Provider.   Follow the instructions on the Hemoccult cards and mail them back to [place] when you are finished or you may take them directly to the lab in the basement of the [place] building. We will call you with the results.   Please go to the lab on the 2nd floor [address] for your blood work on the scheduled day.   Please purchase the following medications over the counter and take as directed: Fish Oil every other day.    Thank you,  Dr. Jackquline Denmark

## 2018-07-12 NOTE — Progress Notes (Signed)
Chief Complaint:   Referring Provider:  Cyndi Bender, PA-C      ASSESSMENT AND PLAN;   #1.  Change in bowel habits.  Now with mushy stools.  History of constipation. Neg colon 07/2014, neg CT 11/2017. #2.  Stable 1.3 cm IPMN, uncinate process without any worrisome factors/no ductal dilatation. (CT 11/2017, MRI 09/2014, CT 06/2014) #3.  Anemia (Hb 11.0 Oct 2019) ?  Etiology.   Plan: - Fish oil QOD. - Add calcium 1/day - Hemoccult cards x 3. - If positive, may need rpt EGD and colon. She wants to hold off at this time. - Check CBC, CMP, B12, iron studies and folate in 8 weeks. - I have offered repeat MRI for follow-up of IPMN.  Patient would like to hold off. - RTC 12 weeks.  Earlier, if with any problems.   HPI:    Jodi Valdez is a 82 y.o. female  Had constipation when she stopped taking fish oil. She brings in the picture of her stool. Finally had a good bowel movement today.  Seen by Cyndi Bender on 07/05/2018-routine blood work revealed mild anemia with hemoglobin of 11, with MCV 97 (upper limit of normal).  Normal BUN/creatinine.  Patient denies having any melena, hematochezia, hematuria, easy bruisability or previous history of anemia.  As below her hemoglobin was 13.9 in 2015.   Neg colon 2015 Dr Henrene Pastor -reviewed with the patient and patient's husband. panc cyst stable since 2015 -she would like to hold off on any further evaluation.  Had multiple MRIs previously.  No ductal dilatation No weight loss No abdominal pain Denies having any upper GI symptoms including heartburn, regurgitation, odynophagia or dysphagia. No melena or hematochezia   Past Medical History:  Diagnosis Date  . Allergy   . Asthma   . Cancer ([place]) 11/01/2000   left breast cancer  . GERD (gastroesophageal reflux disease)   . Hypertension   . Personal history of chemotherapy    2002  . Personal history of radiation therapy    2002  . Polymyalgia ([place])     Past Surgical  History:  Procedure Laterality Date  . ABDOMINAL HYSTERECTOMY  1975  . BREAST SURGERY Left 11/01/2000   masectomy  . COLONOSCOPY  2015  . CYSTOCELE REPAIR  09/1999  . MASTECTOMY Left    2002 with tramflap  . RECTOCELE REPAIR  09/1999    Family History  Problem Relation Age of Onset  . Colon cancer Neg Hx   . Colon polyps Neg Hx   . Diabetes Neg Hx   . Kidney disease Neg Hx   . Esophageal cancer Neg Hx     Social History   Tobacco Use  . Smoking status: Never Smoker  . Smokeless tobacco: Never Used  Substance Use Topics  . Alcohol use: No    Alcohol/week: 0.0 standard drinks  . Drug use: No    Current Outpatient Medications  Medication Sig Dispense Refill  . alendronate (FOSAMAX) 70 MG tablet Take 70 mg by mouth once a week.    Marland Kitchen amitriptyline (ELAVIL) 10 MG tablet Take 10 mg by mouth at bedtime.     . benazepril (LOTENSIN) 40 MG tablet Take 40 mg by mouth daily.    . carvedilol (COREG) 3.125 MG tablet Take 3.125 mg by mouth 2 (two) times daily with a meal.     . Cholecalciferol (VITAMIN D3) 1000 UNITS CAPS Take 1,000 Units by mouth 2 (two) times daily.    Marland Kitchen EPINEPHrine  0.3 mg/0.3 mL IJ SOAJ injection Use as directed for life-threatening allergic reaction. 2 Device 3  . hydrochlorothiazide (HYDRODIURIL) 25 MG tablet Take 25 mg by mouth daily.     Marland Kitchen levothyroxine (SYNTHROID, LEVOTHROID) 125 MCG tablet Take 62.5 mcg by mouth daily before breakfast.     . Multiple Vitamins-Minerals (PRESERVISION/LUTEIN PO) Take 1 tablet by mouth 2 (two) times daily.      . Omega-3 Fatty Acids (FISH OIL) 1000 MG CAPS Take 2 capsules by mouth daily.     . pravastatin (PRAVACHOL) 20 MG tablet Take 20 mg by mouth at bedtime.    . predniSONE (DELTASONE) 5 MG tablet Take 8 mg by mouth daily. 8 mg total dose  0   No current facility-administered medications for this visit.     Allergies  Allergen Reactions  . Propranolol Other (See Comments)    Other reaction(s): Other (See  Comments) hallucinations  . Codeine Nausea And Vomiting  . Nitrofuran Derivatives Nausea And Vomiting    Review of Systems:  Negative except for HPI     Physical Exam:    BP 128/70   Pulse 91   Ht 5\' 4"  (1.626 m)   Wt 129 lb (58.5 kg)   BMI 22.14 kg/m  Filed Weights   06/29/18 1433  Weight: 129 lb (58.5 kg)   Constitutional:  Well-developed, in no acute distress. Psychiatric: Normal mood and affect. Behavior is normal. HEENT: Pupils normal.  Conjunctivae are normal. No scleral icterus. Neck supple.  Cardiovascular: Normal rate, regular rhythm. No edema Pulmonary/chest: Effort normal and breath sounds normal. No wheezing, rales or rhonchi. Abdominal: Soft, nondistended. Nontender. Bowel sounds active throughout. There are no masses palpable. No hepatomegaly. Rectal:  defered Neurological: Alert and oriented to person place and time. Skin: Skin is warm and dry. No rashes noted.  Data Reviewed: I have personally reviewed following labs and imaging studies  CBC: CBC Latest Ref Rng & Units 08/07/2014 08/11/2008  WBC 4.0 - 10.5 K/uL 8.8 7.4  Hemoglobin 12.0 - 15.0 g/dL 13.9 12.1  Hematocrit 36.0 - 46.0 % 41.7 35.8(L)  Platelets 150 - 400 K/uL 279 215    CMP: CMP Latest Ref Rng & Units 10/19/2014 08/07/2014 08/11/2008  Glucose 70 - 99 mg/dL - 114(H) 95  BUN 6 - 23 mg/dL - 17 18  Creatinine 0.50 - 1.10 mg/dL 1.00 0.95 0.62  Sodium 137 - 147 mEq/L - 139 140  Potassium 3.7 - 5.3 mEq/L - 3.8 4.4  Chloride 96 - 112 mEq/L - 95(L) 108  CO2 19 - 32 mEq/L - 31 27  Calcium 8.4 - 10.5 mg/dL - 9.9 9.1  Total Protein 6.0 - 8.3 g/dL - 7.1 -  Total Bilirubin 0.3 - 1.2 mg/dL - 0.4 -  Alkaline Phos 39 - 117 U/L - 48 -  AST 0 - 37 U/L - 16 -  ALT 0 - 35 U/L - 25 -  I spent 15 minutes of face-to-face time with the patient. Greater than 50% of the time was spent counseling and coordinating care.     Carmell Austria, MD 06/29/2018, 2:51 PM  Cc: Cyndi Bender, PA-C

## 2018-07-14 ENCOUNTER — Ambulatory Visit: Payer: Medicare PPO | Admitting: Gastroenterology

## 2018-07-15 ENCOUNTER — Other Ambulatory Visit (INDEPENDENT_AMBULATORY_CARE_PROVIDER_SITE_OTHER): Payer: Medicare PPO

## 2018-07-15 DIAGNOSIS — D649 Anemia, unspecified: Secondary | ICD-10-CM

## 2018-07-15 DIAGNOSIS — R194 Change in bowel habit: Secondary | ICD-10-CM

## 2018-07-18 ENCOUNTER — Other Ambulatory Visit (INDEPENDENT_AMBULATORY_CARE_PROVIDER_SITE_OTHER): Payer: Medicare PPO

## 2018-07-18 DIAGNOSIS — R194 Change in bowel habit: Secondary | ICD-10-CM | POA: Diagnosis not present

## 2018-07-18 DIAGNOSIS — D649 Anemia, unspecified: Secondary | ICD-10-CM

## 2018-07-19 ENCOUNTER — Other Ambulatory Visit: Payer: Self-pay

## 2018-07-19 DIAGNOSIS — D649 Anemia, unspecified: Secondary | ICD-10-CM

## 2018-07-19 DIAGNOSIS — R194 Change in bowel habit: Secondary | ICD-10-CM

## 2018-07-19 LAB — HEMOCCULT SLIDES (X 3 CARDS)
Fecal Occult Blood: NEGATIVE
OCCULT 1: NEGATIVE
OCCULT 2: NEGATIVE
OCCULT 3: NEGATIVE
OCCULT 4: NEGATIVE
OCCULT 5: NEGATIVE

## 2018-07-19 NOTE — Progress Notes (Signed)
Had to change order for lab processing.

## 2018-07-20 ENCOUNTER — Telehealth: Payer: Self-pay | Admitting: Gastroenterology

## 2018-07-20 NOTE — Telephone Encounter (Signed)
Pt's husband calling inquiring about stool test results.

## 2018-07-20 NOTE — Telephone Encounter (Signed)
Spoke with pt and let her know the results are not back yet, we will call her when the results are back.

## 2018-07-25 ENCOUNTER — Ambulatory Visit (INDEPENDENT_AMBULATORY_CARE_PROVIDER_SITE_OTHER): Payer: Medicare PPO | Admitting: *Deleted

## 2018-07-25 DIAGNOSIS — M85851 Other specified disorders of bone density and structure, right thigh: Secondary | ICD-10-CM | POA: Diagnosis not present

## 2018-07-25 DIAGNOSIS — M85852 Other specified disorders of bone density and structure, left thigh: Secondary | ICD-10-CM | POA: Diagnosis not present

## 2018-07-25 DIAGNOSIS — J309 Allergic rhinitis, unspecified: Secondary | ICD-10-CM

## 2018-07-25 DIAGNOSIS — M8580 Other specified disorders of bone density and structure, unspecified site: Secondary | ICD-10-CM | POA: Diagnosis not present

## 2018-07-25 DIAGNOSIS — E2839 Other primary ovarian failure: Secondary | ICD-10-CM | POA: Diagnosis not present

## 2018-08-08 ENCOUNTER — Ambulatory Visit (INDEPENDENT_AMBULATORY_CARE_PROVIDER_SITE_OTHER): Payer: Medicare PPO | Admitting: *Deleted

## 2018-08-08 DIAGNOSIS — J309 Allergic rhinitis, unspecified: Secondary | ICD-10-CM | POA: Diagnosis not present

## 2018-08-22 ENCOUNTER — Ambulatory Visit (INDEPENDENT_AMBULATORY_CARE_PROVIDER_SITE_OTHER): Payer: Medicare PPO | Admitting: *Deleted

## 2018-08-22 DIAGNOSIS — J309 Allergic rhinitis, unspecified: Secondary | ICD-10-CM | POA: Diagnosis not present

## 2018-09-05 ENCOUNTER — Ambulatory Visit (INDEPENDENT_AMBULATORY_CARE_PROVIDER_SITE_OTHER): Payer: Medicare PPO | Admitting: *Deleted

## 2018-09-05 DIAGNOSIS — J309 Allergic rhinitis, unspecified: Secondary | ICD-10-CM

## 2018-09-06 ENCOUNTER — Other Ambulatory Visit: Payer: Medicare PPO

## 2018-09-26 ENCOUNTER — Ambulatory Visit (INDEPENDENT_AMBULATORY_CARE_PROVIDER_SITE_OTHER): Payer: Medicare PPO | Admitting: *Deleted

## 2018-09-26 DIAGNOSIS — J309 Allergic rhinitis, unspecified: Secondary | ICD-10-CM | POA: Diagnosis not present

## 2018-10-04 NOTE — Progress Notes (Signed)
VIALS EXP 1--14-2021

## 2018-10-06 DIAGNOSIS — J3089 Other allergic rhinitis: Secondary | ICD-10-CM | POA: Diagnosis not present

## 2018-10-07 DIAGNOSIS — J301 Allergic rhinitis due to pollen: Secondary | ICD-10-CM | POA: Diagnosis not present

## 2018-10-17 ENCOUNTER — Ambulatory Visit (INDEPENDENT_AMBULATORY_CARE_PROVIDER_SITE_OTHER): Payer: Medicare PPO

## 2018-10-17 DIAGNOSIS — J309 Allergic rhinitis, unspecified: Secondary | ICD-10-CM | POA: Diagnosis not present

## 2018-10-17 DIAGNOSIS — L820 Inflamed seborrheic keratosis: Secondary | ICD-10-CM | POA: Diagnosis not present

## 2018-11-07 ENCOUNTER — Ambulatory Visit (INDEPENDENT_AMBULATORY_CARE_PROVIDER_SITE_OTHER): Payer: Medicare PPO | Admitting: *Deleted

## 2018-11-07 DIAGNOSIS — J309 Allergic rhinitis, unspecified: Secondary | ICD-10-CM | POA: Diagnosis not present

## 2018-11-28 ENCOUNTER — Ambulatory Visit (INDEPENDENT_AMBULATORY_CARE_PROVIDER_SITE_OTHER): Payer: Medicare PPO | Admitting: *Deleted

## 2018-11-28 DIAGNOSIS — J309 Allergic rhinitis, unspecified: Secondary | ICD-10-CM | POA: Diagnosis not present

## 2018-12-19 ENCOUNTER — Ambulatory Visit (INDEPENDENT_AMBULATORY_CARE_PROVIDER_SITE_OTHER): Payer: Medicare PPO | Admitting: *Deleted

## 2018-12-19 DIAGNOSIS — J309 Allergic rhinitis, unspecified: Secondary | ICD-10-CM

## 2018-12-22 DIAGNOSIS — H353122 Nonexudative age-related macular degeneration, left eye, intermediate dry stage: Secondary | ICD-10-CM | POA: Diagnosis not present

## 2019-01-09 ENCOUNTER — Ambulatory Visit (INDEPENDENT_AMBULATORY_CARE_PROVIDER_SITE_OTHER): Payer: Medicare PPO | Admitting: *Deleted

## 2019-01-09 DIAGNOSIS — J309 Allergic rhinitis, unspecified: Secondary | ICD-10-CM | POA: Diagnosis not present

## 2019-01-09 DIAGNOSIS — E7800 Pure hypercholesterolemia, unspecified: Secondary | ICD-10-CM | POA: Diagnosis not present

## 2019-01-09 DIAGNOSIS — E559 Vitamin D deficiency, unspecified: Secondary | ICD-10-CM | POA: Diagnosis not present

## 2019-01-09 DIAGNOSIS — R7303 Prediabetes: Secondary | ICD-10-CM | POA: Diagnosis not present

## 2019-01-09 DIAGNOSIS — E039 Hypothyroidism, unspecified: Secondary | ICD-10-CM | POA: Diagnosis not present

## 2019-01-09 DIAGNOSIS — I10 Essential (primary) hypertension: Secondary | ICD-10-CM | POA: Diagnosis not present

## 2019-01-11 DIAGNOSIS — M8580 Other specified disorders of bone density and structure, unspecified site: Secondary | ICD-10-CM | POA: Diagnosis not present

## 2019-01-11 DIAGNOSIS — I10 Essential (primary) hypertension: Secondary | ICD-10-CM | POA: Diagnosis not present

## 2019-01-11 DIAGNOSIS — E7800 Pure hypercholesterolemia, unspecified: Secondary | ICD-10-CM | POA: Diagnosis not present

## 2019-01-11 DIAGNOSIS — E039 Hypothyroidism, unspecified: Secondary | ICD-10-CM | POA: Diagnosis not present

## 2019-01-11 DIAGNOSIS — R7303 Prediabetes: Secondary | ICD-10-CM | POA: Diagnosis not present

## 2019-01-11 DIAGNOSIS — Z6823 Body mass index (BMI) 23.0-23.9, adult: Secondary | ICD-10-CM | POA: Diagnosis not present

## 2019-01-11 DIAGNOSIS — M353 Polymyalgia rheumatica: Secondary | ICD-10-CM | POA: Diagnosis not present

## 2019-01-23 ENCOUNTER — Ambulatory Visit (INDEPENDENT_AMBULATORY_CARE_PROVIDER_SITE_OTHER): Payer: Medicare PPO | Admitting: *Deleted

## 2019-01-23 DIAGNOSIS — J309 Allergic rhinitis, unspecified: Secondary | ICD-10-CM | POA: Diagnosis not present

## 2019-01-26 DIAGNOSIS — H43812 Vitreous degeneration, left eye: Secondary | ICD-10-CM | POA: Diagnosis not present

## 2019-01-26 DIAGNOSIS — H353122 Nonexudative age-related macular degeneration, left eye, intermediate dry stage: Secondary | ICD-10-CM | POA: Diagnosis not present

## 2019-02-06 ENCOUNTER — Other Ambulatory Visit: Payer: Self-pay

## 2019-02-06 ENCOUNTER — Encounter: Payer: Self-pay | Admitting: Allergy and Immunology

## 2019-02-06 ENCOUNTER — Ambulatory Visit: Payer: Medicare PPO | Admitting: Allergy and Immunology

## 2019-02-06 VITALS — BP 140/64 | HR 84 | Temp 99.0°F | Resp 20

## 2019-02-06 DIAGNOSIS — R0609 Other forms of dyspnea: Secondary | ICD-10-CM | POA: Diagnosis not present

## 2019-02-06 DIAGNOSIS — R011 Cardiac murmur, unspecified: Secondary | ICD-10-CM | POA: Diagnosis not present

## 2019-02-06 DIAGNOSIS — I5189 Other ill-defined heart diseases: Secondary | ICD-10-CM | POA: Diagnosis not present

## 2019-02-06 DIAGNOSIS — K219 Gastro-esophageal reflux disease without esophagitis: Secondary | ICD-10-CM

## 2019-02-06 DIAGNOSIS — J3089 Other allergic rhinitis: Secondary | ICD-10-CM | POA: Diagnosis not present

## 2019-02-06 DIAGNOSIS — J309 Allergic rhinitis, unspecified: Secondary | ICD-10-CM | POA: Diagnosis not present

## 2019-02-06 MED ORDER — FAMOTIDINE 40 MG PO TABS: 40.0000 mg | ORAL_TABLET | Freq: Every day | ORAL | 11 refills | Status: DC

## 2019-02-06 MED ORDER — MONTELUKAST SODIUM 10 MG PO TABS: 10.0000 mg | ORAL_TABLET | Freq: Every day | ORAL | 11 refills | Status: DC

## 2019-02-06 NOTE — Progress Notes (Signed)
[facility] [place] [facility] [place]   Follow-up Note  Referring Provider: Katherina Mires, MD Primary Provider: Katherina Mires, MD Date of Office Visit: 02/06/2019  Subjective:   Jodi Valdez (DOB: 1936-05-24) is a 83 y.o. female who returns to the [facility] on 02/06/2019 in re-evaluation of the following:  HPI: Jodi Valdez presents to this clinic in reevaluation of her allergic rhinoconjunctivitis and history of dyspnea and an issue with LPR.  I have not seen her in this clinic since 31 Jan 2018.  She states that she was doing very well while utilizing a course of immunotherapy and montelukast.  Her immunotherapy was every 3 weeks without any adverse effect.  She really had very little respiratory tract symptoms involving both her upper or lower airways and really had no significant issues with her LPR while consistently using a combination of a proton pump inhibitor and an H2 receptor blocker.  However, 1 month ago she developed lots of throat clearing and postnasal drip and she has been spitting up stuff to clear out her throat especially while she is in the household.  She is driving her husband crazy who told her to come in today to get something done.  She has no associated lower airway symptoms such as chest pain or dyspnea on exertion and she can work outside all day with no problem at all and she has no significant upper airway symptoms.  She can smell and taste and does not have ugly nasal discharge.  It should be noted that she discontinued her H2 receptor blocker about 2 months ago given the FDA recall on ranitidine.  She has not really had any other change in her medical regime.  She continues on daily prednisone for polymyalgia rheumatica.  She has a history of diastolic dysfunction.  Allergies as of 02/06/2019      Reactions   Propranolol Other (See Comments)   Other reaction(s): Other (See Comments) hallucinations   Codeine  Nausea And Vomiting   Nitrofuran Derivatives Nausea And Vomiting      Medication List    TAKE these medications   alendronate 70 MG tablet Commonly known as:  FOSAMAX Take 70 mg by mouth once a week.   amitriptyline 10 MG tablet Commonly known as:  ELAVIL Take 10 mg by mouth at bedtime.   benazepril 40 MG tablet Commonly known as:  LOTENSIN Take 40 mg by mouth daily.   carvedilol 3.125 MG tablet Commonly known as:  COREG Take 3.125 mg by mouth 2 (two) times daily with a meal.   EPINEPHrine 0.3 mg/0.3 mL Soaj injection Commonly known as:  EPI-PEN Use as directed for life-threatening allergic reaction.   hydrochlorothiazide 25 MG tablet Commonly known as:  HYDRODIURIL Take 25 mg by mouth daily.   levothyroxine 125 MCG tablet Commonly known as:  SYNTHROID Take 62.5 mcg by mouth daily before breakfast.   pravastatin 20 MG tablet Commonly known as:  PRAVACHOL Take 20 mg by mouth at bedtime.   predniSONE 5 MG tablet Commonly known as:  DELTASONE Take 8 mg by mouth daily. 8 mg total dose (three 1mg  tablets and one 5mg  tablet)   predniSONE 1 MG tablet Commonly known as:  DELTASONE Taking 8 mg by mouth daily. ( three 1mg  tablets and one 5mg  tablet)   PRESERVISION/LUTEIN PO Take 1 tablet by mouth 2 (two) times daily.   Vitamin D3 25 MCG (1000 UT) Caps Take 1,000 Units by mouth  2 (two) times daily.       Past Medical History:  Diagnosis Date  . Allergy   . Asthma   . Cancer ([place]) 11/01/2000   left breast cancer  . GERD (gastroesophageal reflux disease)   . Hypertension   . Personal history of chemotherapy    2002  . Personal history of radiation therapy    2002  . Polymyalgia ([place])     Past Surgical History:  Procedure Laterality Date  . ABDOMINAL HYSTERECTOMY  1975  . BREAST SURGERY Left 11/01/2000   masectomy  . COLONOSCOPY  2015  . CYSTOCELE REPAIR  09/1999  . MASTECTOMY Left    2002 with tramflap  . RECTOCELE REPAIR  09/1999    Review of systems  negative except as noted in HPI / PMHx or noted below:  Review of Systems  Constitutional: Negative.   HENT: Negative.   Eyes: Negative.   Respiratory: Negative.   Cardiovascular: Negative.   Gastrointestinal: Negative.   Genitourinary: Negative.   Musculoskeletal: Negative.   Skin: Negative.   Neurological: Negative.   Endo/Heme/Allergies: Negative.   Psychiatric/Behavioral: Negative.      Objective:   Vitals:   02/06/19 1030 02/06/19 1035  BP:  140/64  Pulse:  84  Resp:  20  Temp: 99.3 F (37.4 C) 99 F (37.2 C)          Physical Exam Constitutional:      Appearance: She is not diaphoretic.  HENT:     Head: Normocephalic.     Right Ear: Tympanic membrane, ear canal and external ear normal.     Left Ear: Tympanic membrane, ear canal and external ear normal.     Nose: Nose normal. No mucosal edema or rhinorrhea.     Mouth/Throat:     Pharynx: Uvula midline. No oropharyngeal exudate.  Eyes:     Conjunctiva/sclera: Conjunctivae normal.  Neck:     Thyroid: No thyromegaly.     Trachea: Trachea normal. No tracheal tenderness or tracheal deviation.  Cardiovascular:     Rate and Rhythm: Normal rate and regular rhythm.     Heart sounds: S1 normal and S2 normal. Murmur (Systolic) present.  Pulmonary:     Effort: No respiratory distress.     Breath sounds: Normal breath sounds. No stridor. No wheezing or rales.  Lymphadenopathy:     Head:     Right side of head: No tonsillar adenopathy.     Left side of head: No tonsillar adenopathy.     Cervical: No cervical adenopathy.  Skin:    Findings: No erythema or rash.     Nails: There is no clubbing.   Neurological:     Mental Status: She is alert.     Diagnostics:   Results of a echocardiogram obtained 08 Feb 2018 identified EF 50-55%, mild mitral and tricuspid regurgitation, diastolic dysfunction.  Assessment and Plan:   1. Other allergic rhinitis   2. Other form of dyspnea   3. Diastolic dysfunction   4.  Murmur, heart   5. LPRD (laryngopharyngeal reflux disease)   6. Allergic rhinitis, unspecified seasonality, unspecified trigger     1. Continue immunotherapy and EpiPen  2. Continue treatment for reflux:   A.  Continue omeprazole 40mg  in AM  B.  Start famotidine 40 mg in PM  3. Continue montelukast 10 mg 1 time per day  4.  OTC antihistamine-Claritin/Zyrtec/Allegra one time per day if needed   5.  Further evaluation and treatment?  6. Return  to clinic in 1 year or earlier if problem  I will assume that Jensen has had a flare of LPR and we will start her on an H2 receptor blocker while she continues on her proton pump inhibitor.  Temporarily her throat symptoms do correlate with discontinuation of her ranitidine.  I would expect her to improve significantly within the next 2 to 3 weeks and if not we may need to have ENT take a look at her throat.  Fortunately, her other airway issues including her dyspnea appear to be under very good control on her current plan as noted above.  Assuming she does well I will see her back in this clinic in 1 year or earlier if there is a problem.  Allena Katz, MD Allergy / Immunology [place]

## 2019-02-06 NOTE — Patient Instructions (Addendum)
  1. Continue immunotherapy and EpiPen  2. Continue treatment for reflux:   A.  Continue omeprazole 40mg  in AM  B.  Start famotidine 40 mg in PM  3. Continue montelukast 10 mg 1 time per day  4.  OTC antihistamine-Claritin/Zyrtec/Allegra one time per day if needed   5.  Further evaluation and treatment?  6. Return to clinic in 1 year or earlier if problem

## 2019-02-07 ENCOUNTER — Encounter: Payer: Self-pay | Admitting: Allergy and Immunology

## 2019-02-14 ENCOUNTER — Telehealth: Payer: Self-pay | Admitting: Allergy and Immunology

## 2019-02-14 ENCOUNTER — Other Ambulatory Visit: Payer: Self-pay

## 2019-02-14 MED ORDER — OMEPRAZOLE 40 MG PO CPDR: 40.0000 mg | DELAYED_RELEASE_CAPSULE | ORAL | 11 refills | Status: DC

## 2019-02-14 NOTE — Telephone Encounter (Signed)
[place] for Omeprazole 40mg  sent to [facility] as requested.

## 2019-02-14 NOTE — Telephone Encounter (Signed)
Jodi Valdez would like a prescription for Omeprazole sent to [facility] in [place].

## 2019-02-17 DIAGNOSIS — M353 Polymyalgia rheumatica: Secondary | ICD-10-CM | POA: Diagnosis not present

## 2019-02-17 DIAGNOSIS — Z853 Personal history of malignant neoplasm of breast: Secondary | ICD-10-CM | POA: Diagnosis not present

## 2019-02-17 DIAGNOSIS — E0590 Thyrotoxicosis, unspecified without thyrotoxic crisis or storm: Secondary | ICD-10-CM | POA: Diagnosis not present

## 2019-02-17 DIAGNOSIS — S22088S Other fracture of T11-T12 vertebra, sequela: Secondary | ICD-10-CM | POA: Diagnosis not present

## 2019-02-17 DIAGNOSIS — R000 Tachycardia, unspecified: Secondary | ICD-10-CM | POA: Diagnosis not present

## 2019-02-17 DIAGNOSIS — Z79899 Other long term (current) drug therapy: Secondary | ICD-10-CM | POA: Diagnosis not present

## 2019-02-17 DIAGNOSIS — R0902 Hypoxemia: Secondary | ICD-10-CM | POA: Diagnosis not present

## 2019-02-17 DIAGNOSIS — I10 Essential (primary) hypertension: Secondary | ICD-10-CM | POA: Diagnosis not present

## 2019-02-17 DIAGNOSIS — R55 Syncope and collapse: Secondary | ICD-10-CM | POA: Diagnosis not present

## 2019-02-17 DIAGNOSIS — W19XXXA Unspecified fall, initial encounter: Secondary | ICD-10-CM | POA: Diagnosis not present

## 2019-02-17 DIAGNOSIS — R509 Fever, unspecified: Secondary | ICD-10-CM | POA: Diagnosis not present

## 2019-02-17 DIAGNOSIS — R531 Weakness: Secondary | ICD-10-CM | POA: Diagnosis not present

## 2019-02-17 DIAGNOSIS — S32018S Other fracture of first lumbar vertebra, sequela: Secondary | ICD-10-CM | POA: Diagnosis not present

## 2019-02-17 DIAGNOSIS — J181 Lobar pneumonia, unspecified organism: Secondary | ICD-10-CM | POA: Diagnosis not present

## 2019-02-17 DIAGNOSIS — J189 Pneumonia, unspecified organism: Secondary | ICD-10-CM | POA: Diagnosis not present

## 2019-02-17 DIAGNOSIS — J9601 Acute respiratory failure with hypoxia: Secondary | ICD-10-CM | POA: Diagnosis not present

## 2019-02-17 DIAGNOSIS — N3289 Other specified disorders of bladder: Secondary | ICD-10-CM | POA: Diagnosis not present

## 2019-02-17 DIAGNOSIS — E785 Hyperlipidemia, unspecified: Secondary | ICD-10-CM | POA: Diagnosis not present

## 2019-02-18 DIAGNOSIS — I10 Essential (primary) hypertension: Secondary | ICD-10-CM | POA: Diagnosis not present

## 2019-02-18 DIAGNOSIS — J9601 Acute respiratory failure with hypoxia: Secondary | ICD-10-CM | POA: Diagnosis not present

## 2019-02-18 DIAGNOSIS — R531 Weakness: Secondary | ICD-10-CM | POA: Diagnosis not present

## 2019-02-18 DIAGNOSIS — J189 Pneumonia, unspecified organism: Secondary | ICD-10-CM | POA: Diagnosis not present

## 2019-02-18 DIAGNOSIS — M353 Polymyalgia rheumatica: Secondary | ICD-10-CM | POA: Diagnosis not present

## 2019-02-18 DIAGNOSIS — R509 Fever, unspecified: Secondary | ICD-10-CM | POA: Diagnosis not present

## 2019-02-20 ENCOUNTER — Ambulatory Visit (INDEPENDENT_AMBULATORY_CARE_PROVIDER_SITE_OTHER): Payer: Medicare PPO | Admitting: *Deleted

## 2019-02-20 DIAGNOSIS — J309 Allergic rhinitis, unspecified: Secondary | ICD-10-CM | POA: Diagnosis not present

## 2019-02-21 DIAGNOSIS — J9601 Acute respiratory failure with hypoxia: Secondary | ICD-10-CM | POA: Diagnosis not present

## 2019-02-21 DIAGNOSIS — M353 Polymyalgia rheumatica: Secondary | ICD-10-CM | POA: Diagnosis not present

## 2019-02-21 DIAGNOSIS — R296 Repeated falls: Secondary | ICD-10-CM | POA: Diagnosis not present

## 2019-02-21 DIAGNOSIS — M519 Unspecified thoracic, thoracolumbar and lumbosacral intervertebral disc disorder: Secondary | ICD-10-CM | POA: Diagnosis not present

## 2019-02-21 DIAGNOSIS — I10 Essential (primary) hypertension: Secondary | ICD-10-CM | POA: Diagnosis not present

## 2019-02-21 DIAGNOSIS — K219 Gastro-esophageal reflux disease without esophagitis: Secondary | ICD-10-CM | POA: Diagnosis not present

## 2019-02-21 DIAGNOSIS — E7800 Pure hypercholesterolemia, unspecified: Secondary | ICD-10-CM | POA: Diagnosis not present

## 2019-02-21 DIAGNOSIS — M1990 Unspecified osteoarthritis, unspecified site: Secondary | ICD-10-CM | POA: Diagnosis not present

## 2019-02-21 DIAGNOSIS — J189 Pneumonia, unspecified organism: Secondary | ICD-10-CM | POA: Diagnosis not present

## 2019-02-28 DIAGNOSIS — J189 Pneumonia, unspecified organism: Secondary | ICD-10-CM | POA: Diagnosis not present

## 2019-02-28 DIAGNOSIS — M1990 Unspecified osteoarthritis, unspecified site: Secondary | ICD-10-CM | POA: Diagnosis not present

## 2019-02-28 DIAGNOSIS — E871 Hypo-osmolality and hyponatremia: Secondary | ICD-10-CM | POA: Diagnosis not present

## 2019-02-28 DIAGNOSIS — I10 Essential (primary) hypertension: Secondary | ICD-10-CM | POA: Diagnosis not present

## 2019-02-28 DIAGNOSIS — K219 Gastro-esophageal reflux disease without esophagitis: Secondary | ICD-10-CM | POA: Diagnosis not present

## 2019-02-28 DIAGNOSIS — J9601 Acute respiratory failure with hypoxia: Secondary | ICD-10-CM | POA: Diagnosis not present

## 2019-02-28 DIAGNOSIS — M353 Polymyalgia rheumatica: Secondary | ICD-10-CM | POA: Diagnosis not present

## 2019-02-28 DIAGNOSIS — M519 Unspecified thoracic, thoracolumbar and lumbosacral intervertebral disc disorder: Secondary | ICD-10-CM | POA: Diagnosis not present

## 2019-02-28 DIAGNOSIS — R296 Repeated falls: Secondary | ICD-10-CM | POA: Diagnosis not present

## 2019-02-28 DIAGNOSIS — Z6823 Body mass index (BMI) 23.0-23.9, adult: Secondary | ICD-10-CM | POA: Diagnosis not present

## 2019-02-28 DIAGNOSIS — R609 Edema, unspecified: Secondary | ICD-10-CM | POA: Diagnosis not present

## 2019-02-28 DIAGNOSIS — E7800 Pure hypercholesterolemia, unspecified: Secondary | ICD-10-CM | POA: Diagnosis not present

## 2019-03-06 ENCOUNTER — Ambulatory Visit (INDEPENDENT_AMBULATORY_CARE_PROVIDER_SITE_OTHER): Payer: Medicare PPO

## 2019-03-06 DIAGNOSIS — J309 Allergic rhinitis, unspecified: Secondary | ICD-10-CM | POA: Diagnosis not present

## 2019-03-16 DIAGNOSIS — R0602 Shortness of breath: Secondary | ICD-10-CM | POA: Diagnosis not present

## 2019-03-16 DIAGNOSIS — R5383 Other fatigue: Secondary | ICD-10-CM | POA: Diagnosis not present

## 2019-03-16 DIAGNOSIS — Z6823 Body mass index (BMI) 23.0-23.9, adult: Secondary | ICD-10-CM | POA: Diagnosis not present

## 2019-03-16 DIAGNOSIS — M353 Polymyalgia rheumatica: Secondary | ICD-10-CM | POA: Diagnosis not present

## 2019-03-16 DIAGNOSIS — J189 Pneumonia, unspecified organism: Secondary | ICD-10-CM | POA: Diagnosis not present

## 2019-03-27 ENCOUNTER — Ambulatory Visit (INDEPENDENT_AMBULATORY_CARE_PROVIDER_SITE_OTHER): Payer: Medicare PPO | Admitting: *Deleted

## 2019-03-27 DIAGNOSIS — J309 Allergic rhinitis, unspecified: Secondary | ICD-10-CM | POA: Diagnosis not present

## 2019-03-31 DIAGNOSIS — R0602 Shortness of breath: Secondary | ICD-10-CM | POA: Diagnosis not present

## 2019-03-31 DIAGNOSIS — Z6823 Body mass index (BMI) 23.0-23.9, adult: Secondary | ICD-10-CM | POA: Diagnosis not present

## 2019-04-17 ENCOUNTER — Ambulatory Visit (INDEPENDENT_AMBULATORY_CARE_PROVIDER_SITE_OTHER): Payer: Medicare PPO | Admitting: *Deleted

## 2019-04-17 DIAGNOSIS — J309 Allergic rhinitis, unspecified: Secondary | ICD-10-CM | POA: Diagnosis not present

## 2019-05-02 NOTE — Progress Notes (Signed)
VIALS EXP 05-01-2020 

## 2019-05-03 DIAGNOSIS — Z01818 Encounter for other preprocedural examination: Secondary | ICD-10-CM | POA: Diagnosis not present

## 2019-05-03 DIAGNOSIS — J3089 Other allergic rhinitis: Secondary | ICD-10-CM | POA: Diagnosis not present

## 2019-05-03 DIAGNOSIS — H02889 Meibomian gland dysfunction of unspecified eye, unspecified eyelid: Secondary | ICD-10-CM | POA: Diagnosis not present

## 2019-05-03 DIAGNOSIS — H02839 Dermatochalasis of unspecified eye, unspecified eyelid: Secondary | ICD-10-CM | POA: Diagnosis not present

## 2019-05-04 DIAGNOSIS — J3081 Allergic rhinitis due to animal (cat) (dog) hair and dander: Secondary | ICD-10-CM | POA: Diagnosis not present

## 2019-05-11 ENCOUNTER — Ambulatory Visit (INDEPENDENT_AMBULATORY_CARE_PROVIDER_SITE_OTHER): Payer: Medicare PPO | Admitting: *Deleted

## 2019-05-11 DIAGNOSIS — J309 Allergic rhinitis, unspecified: Secondary | ICD-10-CM

## 2019-05-12 ENCOUNTER — Other Ambulatory Visit: Payer: Self-pay | Admitting: *Deleted

## 2019-05-12 MED ORDER — OMEPRAZOLE 40 MG PO CPDR: DELAYED_RELEASE_CAPSULE | ORAL | 3 refills | Status: DC

## 2019-05-12 MED ORDER — FAMOTIDINE 40 MG PO TABS: ORAL_TABLET | ORAL | 3 refills | Status: DC

## 2019-05-12 MED ORDER — MONTELUKAST SODIUM 10 MG PO TABS: ORAL_TABLET | ORAL | 3 refills | Status: DC

## 2019-05-16 ENCOUNTER — Other Ambulatory Visit: Payer: Self-pay | Admitting: *Deleted

## 2019-06-01 ENCOUNTER — Ambulatory Visit (INDEPENDENT_AMBULATORY_CARE_PROVIDER_SITE_OTHER): Payer: Medicare PPO | Admitting: *Deleted

## 2019-06-01 DIAGNOSIS — J309 Allergic rhinitis, unspecified: Secondary | ICD-10-CM

## 2019-06-01 DIAGNOSIS — Z1231 Encounter for screening mammogram for malignant neoplasm of breast: Secondary | ICD-10-CM | POA: Diagnosis not present

## 2019-06-19 ENCOUNTER — Ambulatory Visit (INDEPENDENT_AMBULATORY_CARE_PROVIDER_SITE_OTHER): Payer: Medicare PPO | Admitting: *Deleted

## 2019-06-19 DIAGNOSIS — J309 Allergic rhinitis, unspecified: Secondary | ICD-10-CM | POA: Diagnosis not present

## 2019-06-22 DIAGNOSIS — E785 Hyperlipidemia, unspecified: Secondary | ICD-10-CM | POA: Diagnosis not present

## 2019-06-22 DIAGNOSIS — Z1331 Encounter for screening for depression: Secondary | ICD-10-CM | POA: Diagnosis not present

## 2019-06-22 DIAGNOSIS — Z9181 History of falling: Secondary | ICD-10-CM | POA: Diagnosis not present

## 2019-06-22 DIAGNOSIS — Z0000 Encounter for general adult medical examination without abnormal findings: Secondary | ICD-10-CM | POA: Diagnosis not present

## 2019-07-07 DIAGNOSIS — Z23 Encounter for immunization: Secondary | ICD-10-CM | POA: Diagnosis not present

## 2019-07-12 ENCOUNTER — Ambulatory Visit (INDEPENDENT_AMBULATORY_CARE_PROVIDER_SITE_OTHER): Payer: Medicare PPO | Admitting: *Deleted

## 2019-07-12 DIAGNOSIS — J309 Allergic rhinitis, unspecified: Secondary | ICD-10-CM | POA: Diagnosis not present

## 2019-07-24 ENCOUNTER — Ambulatory Visit (INDEPENDENT_AMBULATORY_CARE_PROVIDER_SITE_OTHER): Payer: Medicare PPO | Admitting: *Deleted

## 2019-07-24 DIAGNOSIS — J309 Allergic rhinitis, unspecified: Secondary | ICD-10-CM

## 2019-07-31 DIAGNOSIS — H02831 Dermatochalasis of right upper eyelid: Secondary | ICD-10-CM | POA: Diagnosis not present

## 2019-07-31 DIAGNOSIS — H02834 Dermatochalasis of left upper eyelid: Secondary | ICD-10-CM | POA: Diagnosis not present

## 2019-07-31 DIAGNOSIS — H02832 Dermatochalasis of right lower eyelid: Secondary | ICD-10-CM | POA: Diagnosis not present

## 2019-08-07 ENCOUNTER — Ambulatory Visit (INDEPENDENT_AMBULATORY_CARE_PROVIDER_SITE_OTHER): Payer: Medicare PPO

## 2019-08-07 DIAGNOSIS — J309 Allergic rhinitis, unspecified: Secondary | ICD-10-CM

## 2019-08-11 DIAGNOSIS — M79662 Pain in left lower leg: Secondary | ICD-10-CM | POA: Diagnosis not present

## 2019-08-11 DIAGNOSIS — M7989 Other specified soft tissue disorders: Secondary | ICD-10-CM | POA: Diagnosis not present

## 2019-08-11 DIAGNOSIS — Z6823 Body mass index (BMI) 23.0-23.9, adult: Secondary | ICD-10-CM | POA: Diagnosis not present

## 2019-08-11 DIAGNOSIS — I82402 Acute embolism and thrombosis of unspecified deep veins of left lower extremity: Secondary | ICD-10-CM | POA: Diagnosis not present

## 2019-08-21 ENCOUNTER — Ambulatory Visit (INDEPENDENT_AMBULATORY_CARE_PROVIDER_SITE_OTHER): Payer: Medicare PPO | Admitting: *Deleted

## 2019-08-21 DIAGNOSIS — J309 Allergic rhinitis, unspecified: Secondary | ICD-10-CM

## 2019-09-04 ENCOUNTER — Ambulatory Visit (INDEPENDENT_AMBULATORY_CARE_PROVIDER_SITE_OTHER): Payer: Medicare PPO

## 2019-09-04 DIAGNOSIS — J309 Allergic rhinitis, unspecified: Secondary | ICD-10-CM | POA: Diagnosis not present

## 2019-09-04 DIAGNOSIS — Z139 Encounter for screening, unspecified: Secondary | ICD-10-CM | POA: Diagnosis not present

## 2019-09-04 DIAGNOSIS — E039 Hypothyroidism, unspecified: Secondary | ICD-10-CM | POA: Diagnosis not present

## 2019-09-04 DIAGNOSIS — E7800 Pure hypercholesterolemia, unspecified: Secondary | ICD-10-CM | POA: Diagnosis not present

## 2019-09-04 DIAGNOSIS — L84 Corns and callosities: Secondary | ICD-10-CM | POA: Diagnosis not present

## 2019-09-04 DIAGNOSIS — Z6823 Body mass index (BMI) 23.0-23.9, adult: Secondary | ICD-10-CM | POA: Diagnosis not present

## 2019-09-04 DIAGNOSIS — I82422 Acute embolism and thrombosis of left iliac vein: Secondary | ICD-10-CM | POA: Diagnosis not present

## 2019-09-04 DIAGNOSIS — Z79899 Other long term (current) drug therapy: Secondary | ICD-10-CM | POA: Diagnosis not present

## 2019-09-12 DIAGNOSIS — N289 Disorder of kidney and ureter, unspecified: Secondary | ICD-10-CM | POA: Diagnosis not present

## 2019-09-25 ENCOUNTER — Ambulatory Visit (INDEPENDENT_AMBULATORY_CARE_PROVIDER_SITE_OTHER): Payer: Medicare PPO | Admitting: *Deleted

## 2019-09-25 DIAGNOSIS — J309 Allergic rhinitis, unspecified: Secondary | ICD-10-CM | POA: Diagnosis not present

## 2019-10-03 DIAGNOSIS — J3089 Other allergic rhinitis: Secondary | ICD-10-CM | POA: Diagnosis not present

## 2019-10-03 NOTE — Progress Notes (Signed)
Vials exp 10-02-20

## 2019-10-04 DIAGNOSIS — J3081 Allergic rhinitis due to animal (cat) (dog) hair and dander: Secondary | ICD-10-CM | POA: Diagnosis not present

## 2019-10-16 ENCOUNTER — Ambulatory Visit (INDEPENDENT_AMBULATORY_CARE_PROVIDER_SITE_OTHER): Payer: Medicare PPO | Admitting: *Deleted

## 2019-10-16 DIAGNOSIS — J309 Allergic rhinitis, unspecified: Secondary | ICD-10-CM | POA: Diagnosis not present

## 2019-11-08 ENCOUNTER — Ambulatory Visit (INDEPENDENT_AMBULATORY_CARE_PROVIDER_SITE_OTHER): Payer: Medicare PPO | Admitting: *Deleted

## 2019-11-08 DIAGNOSIS — J309 Allergic rhinitis, unspecified: Secondary | ICD-10-CM

## 2019-11-13 DIAGNOSIS — J309 Allergic rhinitis, unspecified: Secondary | ICD-10-CM | POA: Diagnosis not present

## 2019-11-13 DIAGNOSIS — Z20822 Contact with and (suspected) exposure to covid-19: Secondary | ICD-10-CM | POA: Diagnosis not present

## 2019-11-13 DIAGNOSIS — S39011A Strain of muscle, fascia and tendon of abdomen, initial encounter: Secondary | ICD-10-CM | POA: Diagnosis not present

## 2019-11-30 DIAGNOSIS — J019 Acute sinusitis, unspecified: Secondary | ICD-10-CM | POA: Diagnosis not present

## 2019-11-30 DIAGNOSIS — R300 Dysuria: Secondary | ICD-10-CM | POA: Diagnosis not present

## 2019-11-30 DIAGNOSIS — Z6824 Body mass index (BMI) 24.0-24.9, adult: Secondary | ICD-10-CM | POA: Diagnosis not present

## 2019-12-01 ENCOUNTER — Ambulatory Visit (INDEPENDENT_AMBULATORY_CARE_PROVIDER_SITE_OTHER): Payer: Medicare PPO | Admitting: *Deleted

## 2019-12-01 DIAGNOSIS — J309 Allergic rhinitis, unspecified: Secondary | ICD-10-CM

## 2019-12-15 IMAGING — MG DIGITAL DIAGNOSTIC UNILATERAL RIGHT MAMMOGRAM WITH TOMO AND CAD
4 series · 4 of 12 positions shown · non-contrast
Comparison: Previous exam(s).

CLINICAL DATA: 81-year-old female with history of left breast
mastectomy presenting for evaluation of pain in the right breast.
She had a shooting pain in the right breast once a couple weeks ago,
but has been pain free ever since. Today, she is asymptomatic.

EXAM:
DIGITAL DIAGNOSTIC UNILATERAL RIGHT MAMMOGRAM WITH CAD AND TOMO

[R CC synth-2D]
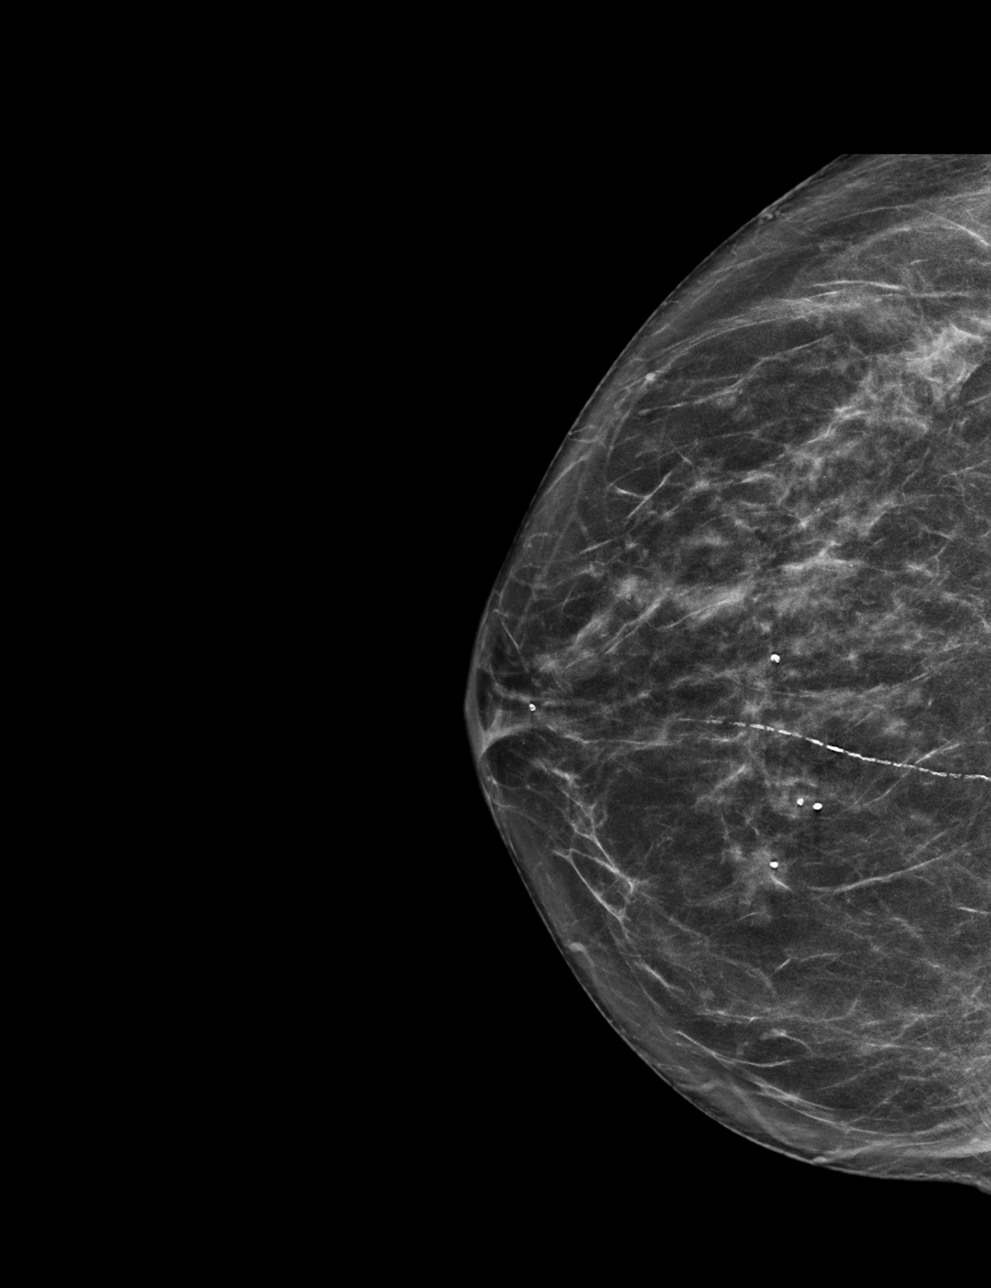

[R MLO synth-2D]
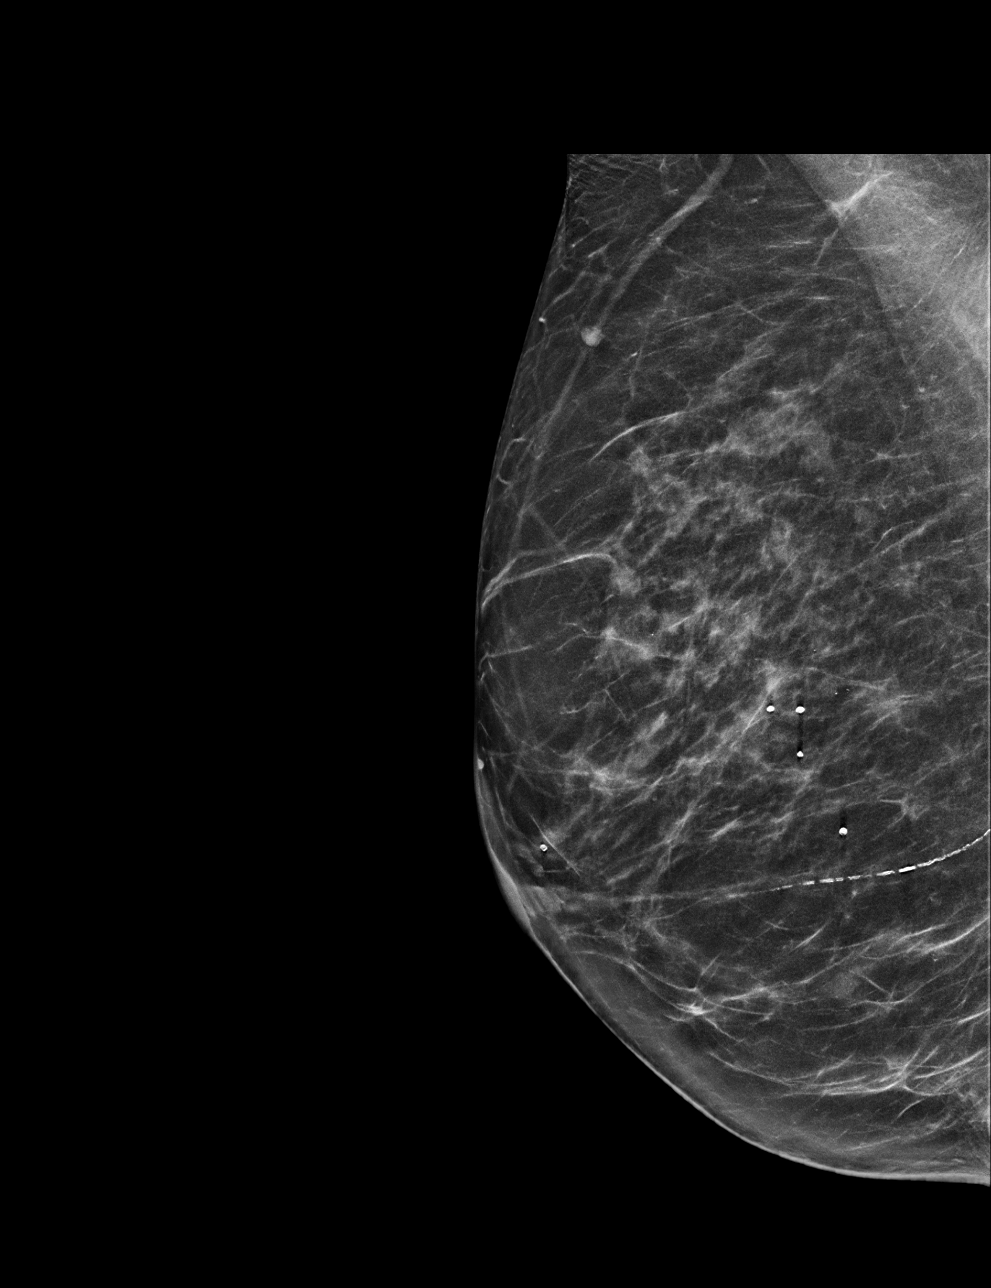

[R CC tomo · tomo slice 34/67.0]
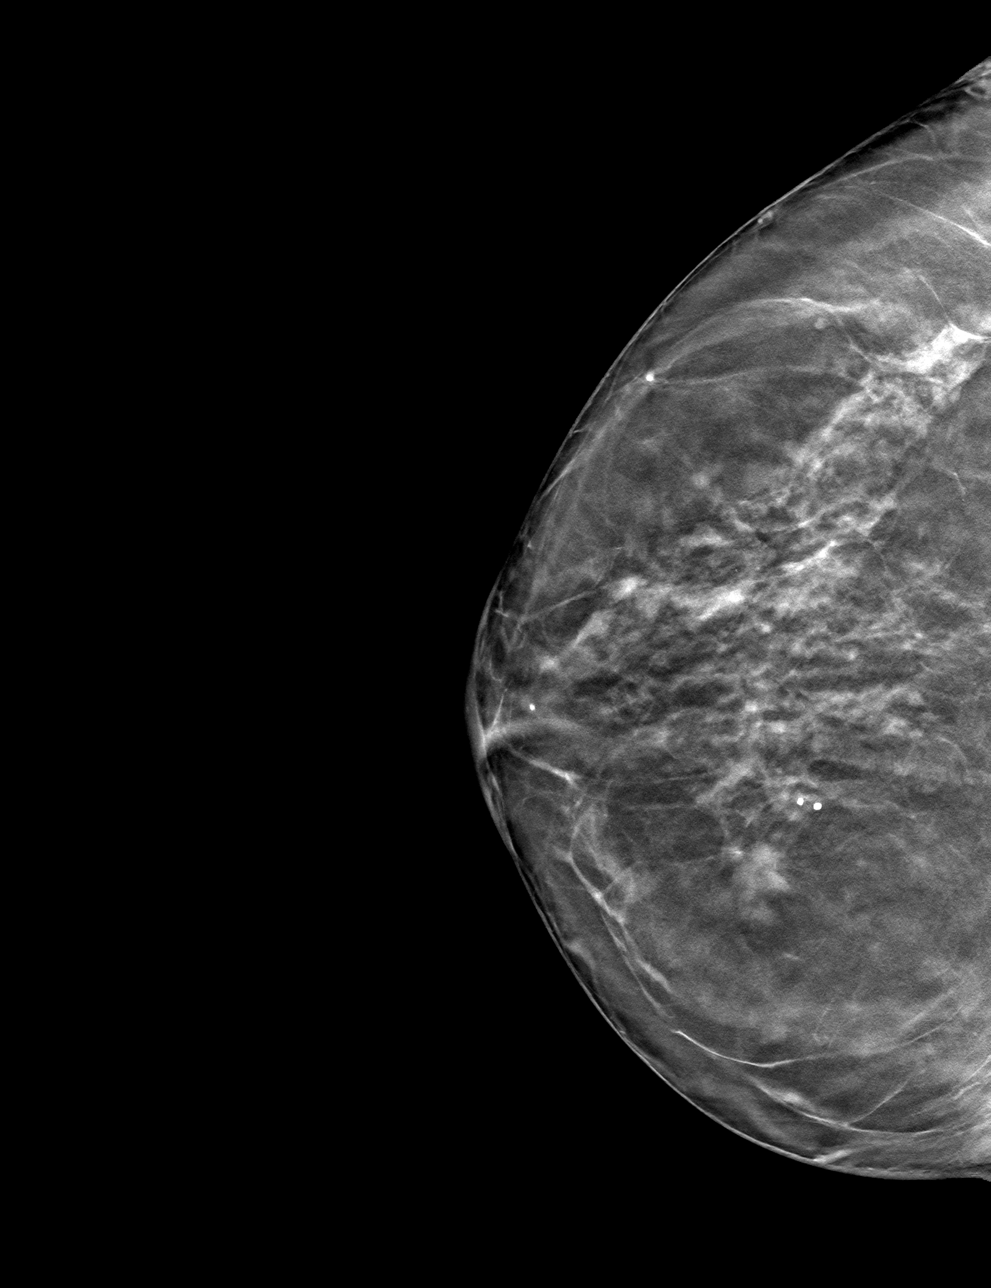

[R MLO tomo · tomo slice 32/63.0]
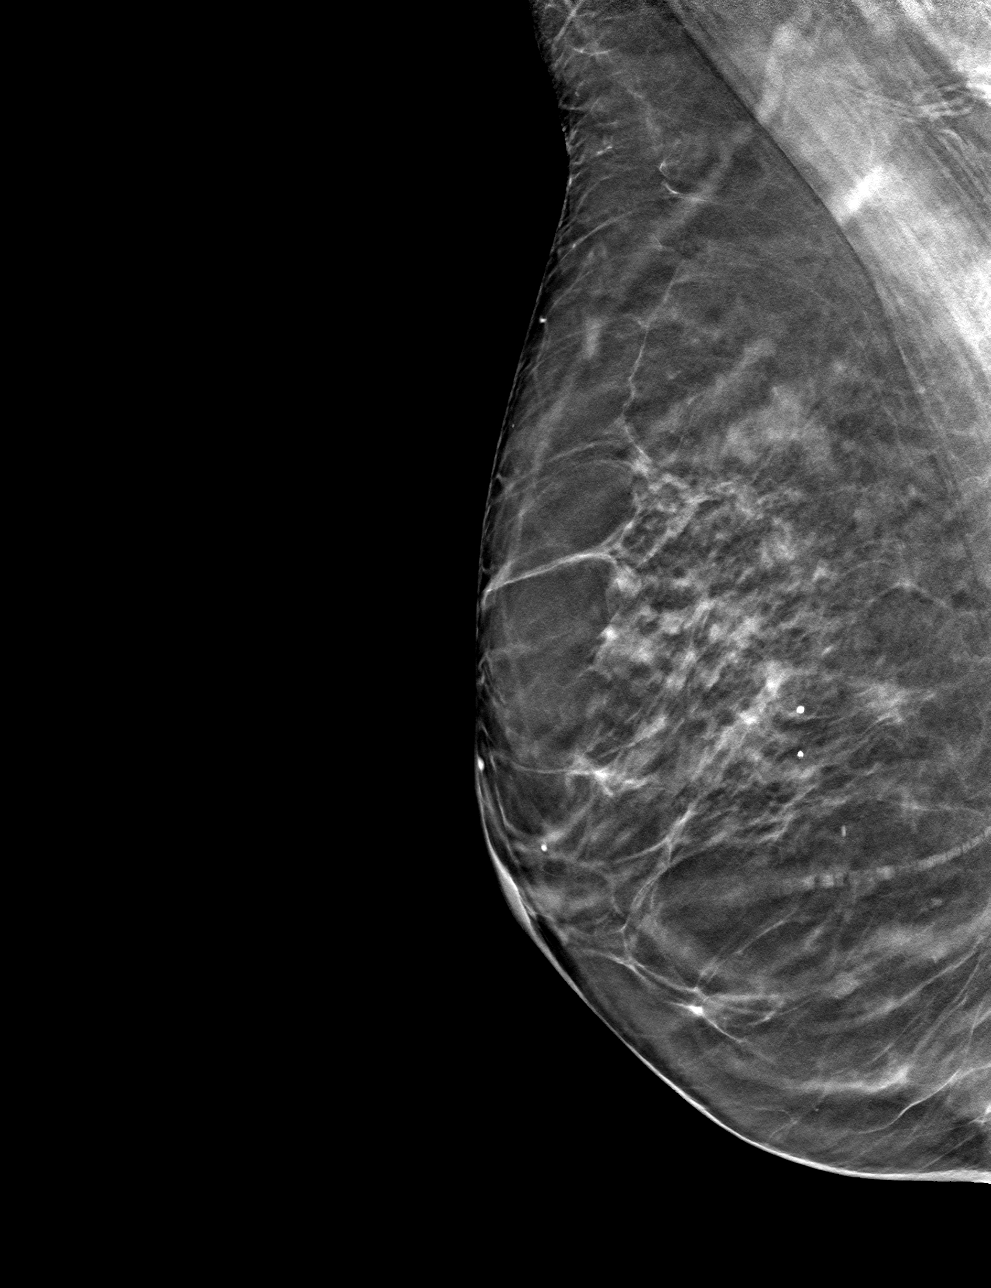

[4 of 12 positions shown; findings below may reference images not displayed]

ACR Breast Density Category c: The breast tissue is heterogeneously
dense, which may obscure small masses.
FINDINGS: No suspicious calcifications, masses or areas of distortion are seen
in the right breast. The patient is status post mastectomy of the
left breast.

Mammographic images were processed with CAD.
IMPRESSION: There are no mammographic changes in the right breast to explain the
patient's episode of right breast pain. No mammographic evidence of
right breast malignancy.

RECOMMENDATION:
1. Clinical follow-up recommended for the painful area of concern in
the right breast. Any further workup should be based on clinical
grounds.

2.  Screening mammogram in one year.(Code:[id number])

I have discussed the findings and recommendations with the patient.
Results were also provided in writing at the conclusion of the
visit. If applicable, a reminder letter will be sent to the patient
regarding the next appointment.

BI-RADS CATEGORY  1: Negative.

## 2019-12-18 ENCOUNTER — Ambulatory Visit (INDEPENDENT_AMBULATORY_CARE_PROVIDER_SITE_OTHER): Payer: Medicare PPO | Admitting: *Deleted

## 2019-12-18 DIAGNOSIS — J309 Allergic rhinitis, unspecified: Secondary | ICD-10-CM | POA: Diagnosis not present

## 2019-12-27 DIAGNOSIS — H353122 Nonexudative age-related macular degeneration, left eye, intermediate dry stage: Secondary | ICD-10-CM | POA: Diagnosis not present

## 2019-12-27 DIAGNOSIS — H26492 Other secondary cataract, left eye: Secondary | ICD-10-CM | POA: Diagnosis not present

## 2020-01-01 ENCOUNTER — Ambulatory Visit (INDEPENDENT_AMBULATORY_CARE_PROVIDER_SITE_OTHER): Payer: Medicare PPO | Admitting: *Deleted

## 2020-01-01 DIAGNOSIS — J309 Allergic rhinitis, unspecified: Secondary | ICD-10-CM

## 2020-01-08 DIAGNOSIS — H353212 Exudative age-related macular degeneration, right eye, with inactive choroidal neovascularization: Secondary | ICD-10-CM | POA: Diagnosis not present

## 2020-01-08 DIAGNOSIS — H353121 Nonexudative age-related macular degeneration, left eye, early dry stage: Secondary | ICD-10-CM | POA: Diagnosis not present

## 2020-01-15 ENCOUNTER — Ambulatory Visit (INDEPENDENT_AMBULATORY_CARE_PROVIDER_SITE_OTHER): Payer: Medicare PPO | Admitting: *Deleted

## 2020-01-15 DIAGNOSIS — J309 Allergic rhinitis, unspecified: Secondary | ICD-10-CM | POA: Diagnosis not present

## 2020-01-24 DIAGNOSIS — Z6823 Body mass index (BMI) 23.0-23.9, adult: Secondary | ICD-10-CM | POA: Diagnosis not present

## 2020-01-24 DIAGNOSIS — B370 Candidal stomatitis: Secondary | ICD-10-CM | POA: Diagnosis not present

## 2020-01-29 ENCOUNTER — Ambulatory Visit (INDEPENDENT_AMBULATORY_CARE_PROVIDER_SITE_OTHER): Payer: Medicare PPO | Admitting: *Deleted

## 2020-01-29 DIAGNOSIS — J309 Allergic rhinitis, unspecified: Secondary | ICD-10-CM

## 2020-02-12 ENCOUNTER — Ambulatory Visit (INDEPENDENT_AMBULATORY_CARE_PROVIDER_SITE_OTHER): Payer: Medicare PPO | Admitting: *Deleted

## 2020-02-12 DIAGNOSIS — J309 Allergic rhinitis, unspecified: Secondary | ICD-10-CM

## 2020-03-01 DIAGNOSIS — Z8619 Personal history of other infectious and parasitic diseases: Secondary | ICD-10-CM | POA: Diagnosis not present

## 2020-03-01 DIAGNOSIS — Z972 Presence of dental prosthetic device (complete) (partial): Secondary | ICD-10-CM | POA: Diagnosis not present

## 2020-03-04 DIAGNOSIS — E039 Hypothyroidism, unspecified: Secondary | ICD-10-CM | POA: Diagnosis not present

## 2020-03-04 DIAGNOSIS — G4700 Insomnia, unspecified: Secondary | ICD-10-CM | POA: Diagnosis not present

## 2020-03-04 DIAGNOSIS — E7800 Pure hypercholesterolemia, unspecified: Secondary | ICD-10-CM | POA: Diagnosis not present

## 2020-03-04 DIAGNOSIS — R7303 Prediabetes: Secondary | ICD-10-CM | POA: Diagnosis not present

## 2020-03-04 DIAGNOSIS — I5030 Unspecified diastolic (congestive) heart failure: Secondary | ICD-10-CM | POA: Diagnosis not present

## 2020-03-04 DIAGNOSIS — I10 Essential (primary) hypertension: Secondary | ICD-10-CM | POA: Diagnosis not present

## 2020-03-04 DIAGNOSIS — Z6824 Body mass index (BMI) 24.0-24.9, adult: Secondary | ICD-10-CM | POA: Diagnosis not present

## 2020-03-04 DIAGNOSIS — M353 Polymyalgia rheumatica: Secondary | ICD-10-CM | POA: Diagnosis not present

## 2020-03-11 ENCOUNTER — Ambulatory Visit (INDEPENDENT_AMBULATORY_CARE_PROVIDER_SITE_OTHER): Payer: Medicare PPO | Admitting: *Deleted

## 2020-03-11 DIAGNOSIS — J309 Allergic rhinitis, unspecified: Secondary | ICD-10-CM

## 2020-03-26 DIAGNOSIS — Z6823 Body mass index (BMI) 23.0-23.9, adult: Secondary | ICD-10-CM | POA: Diagnosis not present

## 2020-03-26 DIAGNOSIS — R300 Dysuria: Secondary | ICD-10-CM | POA: Diagnosis not present

## 2020-03-31 DIAGNOSIS — N3091 Cystitis, unspecified with hematuria: Secondary | ICD-10-CM | POA: Diagnosis not present

## 2020-04-01 DIAGNOSIS — I700 Atherosclerosis of aorta: Secondary | ICD-10-CM | POA: Diagnosis not present

## 2020-04-01 DIAGNOSIS — K7689 Other specified diseases of liver: Secondary | ICD-10-CM | POA: Diagnosis not present

## 2020-04-01 DIAGNOSIS — R300 Dysuria: Secondary | ICD-10-CM | POA: Diagnosis not present

## 2020-04-01 DIAGNOSIS — R519 Headache, unspecified: Secondary | ICD-10-CM | POA: Diagnosis not present

## 2020-04-01 DIAGNOSIS — R339 Retention of urine, unspecified: Secondary | ICD-10-CM | POA: Diagnosis not present

## 2020-04-01 DIAGNOSIS — E871 Hypo-osmolality and hyponatremia: Secondary | ICD-10-CM | POA: Diagnosis not present

## 2020-04-01 DIAGNOSIS — R5383 Other fatigue: Secondary | ICD-10-CM | POA: Diagnosis not present

## 2020-04-01 DIAGNOSIS — N1330 Unspecified hydronephrosis: Secondary | ICD-10-CM | POA: Diagnosis not present

## 2020-04-01 DIAGNOSIS — N3289 Other specified disorders of bladder: Secondary | ICD-10-CM | POA: Diagnosis not present

## 2020-04-02 DIAGNOSIS — N179 Acute kidney failure, unspecified: Secondary | ICD-10-CM | POA: Diagnosis not present

## 2020-04-02 DIAGNOSIS — R519 Headache, unspecified: Secondary | ICD-10-CM | POA: Diagnosis not present

## 2020-04-02 DIAGNOSIS — N390 Urinary tract infection, site not specified: Secondary | ICD-10-CM | POA: Diagnosis not present

## 2020-04-02 DIAGNOSIS — E871 Hypo-osmolality and hyponatremia: Secondary | ICD-10-CM | POA: Diagnosis not present

## 2020-04-02 DIAGNOSIS — R5383 Other fatigue: Secondary | ICD-10-CM | POA: Diagnosis not present

## 2020-04-03 DIAGNOSIS — N390 Urinary tract infection, site not specified: Secondary | ICD-10-CM | POA: Diagnosis not present

## 2020-04-03 DIAGNOSIS — N179 Acute kidney failure, unspecified: Secondary | ICD-10-CM | POA: Diagnosis not present

## 2020-04-03 DIAGNOSIS — E871 Hypo-osmolality and hyponatremia: Secondary | ICD-10-CM | POA: Diagnosis not present

## 2020-04-04 DIAGNOSIS — N179 Acute kidney failure, unspecified: Secondary | ICD-10-CM | POA: Diagnosis not present

## 2020-04-04 DIAGNOSIS — N390 Urinary tract infection, site not specified: Secondary | ICD-10-CM | POA: Diagnosis not present

## 2020-04-04 DIAGNOSIS — E871 Hypo-osmolality and hyponatremia: Secondary | ICD-10-CM | POA: Diagnosis not present

## 2020-04-10 ENCOUNTER — Ambulatory Visit (INDEPENDENT_AMBULATORY_CARE_PROVIDER_SITE_OTHER): Payer: Medicare PPO

## 2020-04-10 DIAGNOSIS — N390 Urinary tract infection, site not specified: Secondary | ICD-10-CM | POA: Diagnosis not present

## 2020-04-10 DIAGNOSIS — J309 Allergic rhinitis, unspecified: Secondary | ICD-10-CM | POA: Diagnosis not present

## 2020-04-10 DIAGNOSIS — R339 Retention of urine, unspecified: Secondary | ICD-10-CM | POA: Diagnosis not present

## 2020-04-10 DIAGNOSIS — Z87448 Personal history of other diseases of urinary system: Secondary | ICD-10-CM | POA: Diagnosis not present

## 2020-04-10 DIAGNOSIS — Z79899 Other long term (current) drug therapy: Secondary | ICD-10-CM | POA: Diagnosis not present

## 2020-04-10 DIAGNOSIS — N8111 Cystocele, midline: Secondary | ICD-10-CM | POA: Diagnosis not present

## 2020-04-10 DIAGNOSIS — N952 Postmenopausal atrophic vaginitis: Secondary | ICD-10-CM | POA: Diagnosis not present

## 2020-05-08 ENCOUNTER — Ambulatory Visit (INDEPENDENT_AMBULATORY_CARE_PROVIDER_SITE_OTHER): Payer: Medicare PPO

## 2020-05-08 DIAGNOSIS — J309 Allergic rhinitis, unspecified: Secondary | ICD-10-CM

## 2020-05-08 NOTE — Progress Notes (Signed)
Patient received 0.10 of Tree extract from incorrect vial. Patient was asked to wait in waiting room to be observed for observation period. Patient had no reaction during waiting period.

## 2020-05-14 DIAGNOSIS — J3089 Other allergic rhinitis: Secondary | ICD-10-CM

## 2020-05-14 NOTE — Progress Notes (Signed)
VIALS EXP 05-14-21

## 2020-05-15 DIAGNOSIS — J3081 Allergic rhinitis due to animal (cat) (dog) hair and dander: Secondary | ICD-10-CM | POA: Diagnosis not present

## 2020-05-23 ENCOUNTER — Ambulatory Visit: Payer: Medicare PPO | Admitting: Cardiology

## 2020-05-23 ENCOUNTER — Encounter: Payer: Self-pay | Admitting: Cardiology

## 2020-05-23 ENCOUNTER — Other Ambulatory Visit: Payer: Self-pay

## 2020-05-23 VITALS — BP 110/50 | HR 75 | Ht 64.0 in | Wt 136.0 lb

## 2020-05-23 DIAGNOSIS — I10 Essential (primary) hypertension: Secondary | ICD-10-CM

## 2020-05-23 DIAGNOSIS — R0600 Dyspnea, unspecified: Secondary | ICD-10-CM

## 2020-05-23 DIAGNOSIS — M7989 Other specified soft tissue disorders: Secondary | ICD-10-CM | POA: Diagnosis not present

## 2020-05-23 DIAGNOSIS — R0609 Other forms of dyspnea: Secondary | ICD-10-CM

## 2020-05-23 NOTE — Patient Instructions (Signed)
Medication Instructions:  Your physician recommends that you continue on your current medications as directed. Please refer to the Current Medication list given to you today.  *If you need a refill on your cardiac medications before your next appointment, please call your pharmacy*   Lab Work: Your physician recommends that you return for lab work today: pro bnp   If you have labs (blood work) drawn today and your tests are completely normal, you will receive your results only by: Marland Kitchen MyChart Message (if you have MyChart) OR . A paper copy in the mail If you have any lab test that is abnormal or we need to change your treatment, we will call you to review the results.   Testing/Procedures: Your physician has requested that you have an echocardiogram. Echocardiography is a painless test that uses sound waves to create images of your heart. It provides your doctor with information about the size and shape of your heart and how well your heart's chambers and valves are working. This procedure takes approximately one hour. There are no restrictions for this procedure.     Follow-Up: At [facility], you and your health needs are our priority.  As part of our continuing mission to provide you with exceptional heart care, we have created designated Provider Care Teams.  These Care Teams include your primary Cardiologist (physician) and Advanced Practice Providers (APPs -  Physician Assistants and Nurse Practitioners) who all work together to provide you with the care you need, when you need it.  We recommend signing up for the patient portal called "MyChart".  Sign up information is provided on this After Visit Summary.  MyChart is used to connect with patients for Virtual Visits (Telemedicine).  Patients are able to view lab/test results, encounter notes, upcoming appointments, etc.  Non-urgent messages can be sent to your provider as well.   To learn more about what you can do with MyChart, go  to [email or web address].    Your next appointment:   2 month(s)  The format for your next appointment:   In Person  Provider:   Jenne Campus, MD   Other Instructions   Echocardiogram An echocardiogram is a procedure that uses painless sound waves (ultrasound) to produce an image of the heart. Images from an echocardiogram can provide important information about:  Signs of coronary artery disease (CAD).  Aneurysm detection. An aneurysm is a weak or damaged part of an artery wall that bulges out from the normal force of blood pumping through the body.  Heart size and shape. Changes in the size or shape of the heart can be associated with certain conditions, including heart failure, aneurysm, and CAD.  Heart muscle function.  Heart valve function.  Signs of a past heart attack.  Fluid buildup around the heart.  Thickening of the heart muscle.  A tumor or infectious growth around the heart valves. Tell a health care provider about:  Any allergies you have.  All medicines you are taking, including vitamins, herbs, eye drops, creams, and over-the-counter medicines.  Any blood disorders you have.  Any surgeries you have had.  Any medical conditions you have.  Whether you are pregnant or may be pregnant. What are the risks? Generally, this is a safe procedure. However, problems may occur, including:  Allergic reaction to dye (contrast) that may be used during the procedure. What happens before the procedure? No specific preparation is needed. You may eat and drink normally. What happens during the procedure?   An IV  tube may be inserted into one of your veins.  You may receive contrast through this tube. A contrast is an injection that improves the quality of the pictures from your heart.  A gel will be applied to your chest.  A wand-like tool (transducer) will be moved over your chest. The gel will help to transmit the sound waves from the  transducer.  The sound waves will harmlessly bounce off of your heart to allow the heart images to be captured in real-time motion. The images will be recorded on a computer. The procedure may vary among health care providers and hospitals. What happens after the procedure?  You may return to your normal, everyday life, including diet, activities, and medicines, unless your health care provider tells you not to do that. Summary  An echocardiogram is a procedure that uses painless sound waves (ultrasound) to produce an image of the heart.  Images from an echocardiogram can provide important information about the size and shape of your heart, heart muscle function, heart valve function, and fluid buildup around your heart.  You do not need to do anything to prepare before this procedure. You may eat and drink normally.  After the echocardiogram is completed, you may return to your normal, everyday life, unless your health care provider tells you not to do that. This information is not intended to replace advice given to you by your health care provider. Make sure you discuss any questions you have with your health care provider. Document Revised: 12/29/2018 Document Reviewed: 10/10/2016 Elsevier Patient Education  [place].

## 2020-05-23 NOTE — Progress Notes (Signed)
Cardiology Office Note:    Date:  05/23/2020   ID:  Jodi Valdez, DOB 1935-09-30, MRN [id number]  PCP:  Suzan Garibaldi, FNP  Cardiologist:  Jenne Campus, MD    Referring MD: Katherina Mires, MD   Chief Complaint  Patient presents with  . Shortness of Breath  . Establish Care    History of Present Illness:    Jodi Valdez is a 84 y.o. female I did see her in 2019 and concern at that time was the fact that her echocardiogram shows some relaxation abnormality.  She comes back to our office to be reestablished.  She does have a lot of concerns about her heart.:  Plan is to 5 that she does have some swelling of lower extremities that happen especially at evening time and is worse within the last few months.  She was given diuretic with limited response.  She also complained of being tired with exhausted as well as having some fatigue and shortness of breath while exercising.  There is no paroxysmal nocturnal dyspnea there is no palpitations no dizziness no passing out.  She does have 4 dogs but she does not walk those dogs.  Also when asked her about how she feels right now compared to herself 6 months ago she says she is definitely worse.  Denies have any chest pain tightness squeezing pressure burning chest.  Also recently she was find out to be mildly anemic.  Few months ago she ended going to the hospital because of urine tract infection.  Past Medical History:  Diagnosis Date  . Allergy   . Anemia   . Asthma   . Cancer ([place]) 11/01/2000   left breast cancer  . GERD (gastroesophageal reflux disease)   . Hypertension   . Personal history of chemotherapy    2002  . Personal history of radiation therapy    2002  . Polymyalgia ([place])     Past Surgical History:  Procedure Laterality Date  . ABDOMINAL HYSTERECTOMY  1975  . BREAST SURGERY Left 11/01/2000   masectomy  . COLONOSCOPY  2015  . CYSTOCELE REPAIR  09/1999  . MASTECTOMY Left    2002 with tramflap  .  RECTOCELE REPAIR  09/1999    Current Medications: Current Meds  Medication Sig  . alendronate (FOSAMAX) 70 MG tablet Take 70 mg by mouth once a week.  . benazepril (LOTENSIN) 40 MG tablet Take 40 mg by mouth daily.  . carvedilol (COREG) 3.125 MG tablet Take 3.125 mg by mouth 2 (two) times daily with a meal.   . Cholecalciferol (VITAMIN D3) 1000 UNITS CAPS Take 1,000 Units by mouth 2 (two) times daily.  Marland Kitchen EPINEPHrine 0.3 mg/0.3 mL IJ SOAJ injection Use as directed for life-threatening allergic reaction.  . furosemide (LASIX) 20 MG tablet 20 mg daily.  Marland Kitchen KLOR-CON M10 10 MEQ tablet 10 mEq daily.  Marland Kitchen levothyroxine (SYNTHROID, LEVOTHROID) 125 MCG tablet Take 62.5 mcg by mouth daily before breakfast.   . Multiple Vitamins-Minerals (PRESERVISION/LUTEIN PO) Take 1 tablet by mouth 2 (two) times daily.    Marland Kitchen omeprazole (PRILOSEC) 40 MG capsule Take one capsule every morning  . pravastatin (PRAVACHOL) 20 MG tablet Take 20 mg by mouth at bedtime.  . predniSONE (DELTASONE) 1 MG tablet Taking 8 mg by mouth daily. ( three 1mg  tablets and one 5mg  tablet)  . traZODone (DESYREL) 50 MG tablet 50 mg at bedtime as needed.  . [DISCONTINUED] amitriptyline (ELAVIL) 10 MG tablet Take 10 mg by  mouth at bedtime.      Allergies:   Propranolol, Codeine, and Nitrofuran derivatives   Social History   Socioeconomic History  . Marital status: Married    Spouse name: Not on file  . Number of children: 2  . Years of education: Not on file  . Highest education level: Not on file  Occupational History  . Occupation: Retired    Fish farm manager: RETIRED  Tobacco Use  . Smoking status: Never Smoker  . Smokeless tobacco: Never Used  Vaping Use  . Vaping Use: Never used  Substance and Sexual Activity  . Alcohol use: No    Alcohol/week: 0.0 standard drinks  . Drug use: No  . Sexual activity: Not on file  Other Topics Concern  . Not on file  Social History Narrative  . Not on file   Social Determinants of Health    Financial Resource Strain:   . Difficulty of Paying Living Expenses: Not on file  Food Insecurity:   . Worried About Charity fundraiser in the Last Year: Not on file  . Ran Out of Food in the Last Year: Not on file  Transportation Needs:   . Lack of Transportation (Medical): Not on file  . Lack of Transportation (Non-Medical): Not on file  Physical Activity:   . Days of Exercise per Week: Not on file  . Minutes of Exercise per Session: Not on file  Stress:   . Feeling of Stress : Not on file  Social Connections:   . Frequency of Communication with Friends and Family: Not on file  . Frequency of Social Gatherings with Friends and Family: Not on file  . Attends Religious Services: Not on file  . Active Member of Clubs or Organizations: Not on file  . Attends Archivist Meetings: Not on file  . Marital Status: Not on file     Family History: The patient's family history is negative for Colon cancer, Colon polyps, Diabetes, Kidney disease, and Esophageal cancer. ROS:   Please see the history of present illness.    All 14 point review of systems negative except as described per history of present illness  EKGs/Labs/Other Studies Reviewed:      Recent Labs: No results found for requested labs within last 8760 hours.  Recent Lipid Panel No results found for: CHOL, TRIG, HDL, CHOLHDL, VLDL, LDLCALC, LDLDIRECT  Physical Exam:    VS:  BP (!) 110/50 (BP Location: Right Arm, Patient Position: Sitting, Cuff Size: Normal)   Pulse 75   Ht 5\' 4"  (1.626 m)   Wt 136 lb (61.7 kg)   SpO2 97%   BMI 23.34 kg/m     Wt Readings from Last 3 Encounters:  05/23/20 136 lb (61.7 kg)  07/12/18 130 lb 4 oz (59.1 kg)  06/29/18 129 lb (58.5 kg)     GEN:  Well nourished, well developed in no acute distress HEENT: Normal NECK: No JVD; No carotid bruits LYMPHATICS: No lymphadenopathy CARDIAC: RRR, no murmurs, no rubs, no gallops RESPIRATORY:  Clear to auscultation without rales,  wheezing or rhonchi  ABDOMEN: Soft, non-tender, non-distended MUSCULOSKELETAL:  No edema; No deformity  SKIN: Warm and dry LOWER EXTREMITIES: 1+ swelling bilaterally NEUROLOGIC:  Alert and oriented x 3 PSYCHIATRIC:  Normal affect   ASSESSMENT:    1. Dyspnea, unspecified type   2. Dyspnea on exertion   3. Essential hypertension   4. Swelling of both lower extremities    PLAN:    In order of  problems listed above:  1. Dyspnea on exertion.  I will schedule her to have an echocardiogram to assess left ventricle ejection fraction.  I do this especially in view of the fact that she does have some swelling of lower extremities.  proBNP will be checked on her as well.  I will not alter any of her medication until diagnosis will be more clear. 2. Essential hypertension blood pressure seems to be well controlled today we will continue present medications. 3. Dyslipidemia: She is on pravastatin 20 mg and I did review laboratory test from her primary care physician and her cholesterol seems to be well controlled. 4. She does have constellation of symptoms that are worrisome.  I will ask her to have an echocardiogram as well as proBNP to establish diagnosis.   Medication Adjustments/Labs and Tests Ordered: Current medicines are reviewed at length with the patient today.  Concerns regarding medicines are outlined above.  Orders Placed This Encounter  Procedures  . Pro b natriuretic peptide (BNP)  . EKG 12-Lead  . ECHOCARDIOGRAM COMPLETE   Medication changes: No orders of the defined types were placed in this encounter.   Signed, Park Liter, MD, [facility] 05/23/2020 11:09 AM    [place]

## 2020-05-24 LAB — PRO B NATRIURETIC PEPTIDE: NT-Pro BNP: 289 pg/mL (ref 0–738)

## 2020-06-04 ENCOUNTER — Encounter: Payer: Self-pay | Admitting: Gastroenterology

## 2020-06-04 ENCOUNTER — Other Ambulatory Visit (INDEPENDENT_AMBULATORY_CARE_PROVIDER_SITE_OTHER): Payer: Medicare PPO

## 2020-06-04 ENCOUNTER — Ambulatory Visit: Payer: Medicare PPO | Admitting: Gastroenterology

## 2020-06-04 VITALS — Ht 64.0 in | Wt 138.8 lb

## 2020-06-04 DIAGNOSIS — R1013 Epigastric pain: Secondary | ICD-10-CM | POA: Diagnosis not present

## 2020-06-04 DIAGNOSIS — K219 Gastro-esophageal reflux disease without esophagitis: Secondary | ICD-10-CM | POA: Insufficient documentation

## 2020-06-04 DIAGNOSIS — R195 Other fecal abnormalities: Secondary | ICD-10-CM | POA: Diagnosis not present

## 2020-06-04 DIAGNOSIS — D649 Anemia, unspecified: Secondary | ICD-10-CM

## 2020-06-04 DIAGNOSIS — Z791 Long term (current) use of non-steroidal anti-inflammatories (NSAID): Secondary | ICD-10-CM

## 2020-06-04 LAB — CBC WITH DIFFERENTIAL/PLATELET
Basophils Absolute: 0.1 10*3/uL (ref 0.0–0.1)
Basophils Relative: 0.6 % (ref 0.0–3.0)
Eosinophils Absolute: 0.2 10*3/uL (ref 0.0–0.7)
Eosinophils Relative: 1.3 % (ref 0.0–5.0)
HCT: 32.8 % — ABNORMAL LOW (ref 36.0–46.0)
Hemoglobin: 10.9 g/dL — ABNORMAL LOW (ref 12.0–15.0)
Lymphocytes Relative: 12.7 % (ref 12.0–46.0)
Lymphs Abs: 1.6 10*3/uL (ref 0.7–4.0)
MCHC: 33.3 g/dL (ref 30.0–36.0)
MCV: 95 fl (ref 78.0–100.0)
Monocytes Absolute: 0.5 10*3/uL (ref 0.1–1.0)
Monocytes Relative: 4.4 % (ref 3.0–12.0)
Neutro Abs: 10 10*3/uL — ABNORMAL HIGH (ref 1.4–7.7)
Neutrophils Relative %: 81 % — ABNORMAL HIGH (ref 43.0–77.0)
Platelets: 244 10*3/uL (ref 150.0–400.0)
RBC: 3.45 Mil/uL — ABNORMAL LOW (ref 3.87–5.11)
RDW: 13.5 % (ref 11.5–15.5)
WBC: 12.3 10*3/uL — ABNORMAL HIGH (ref 4.0–10.5)

## 2020-06-04 NOTE — Progress Notes (Signed)
Agree with the note and plan. RG

## 2020-06-04 NOTE — Patient Instructions (Addendum)
If you are age 82 or older, your body mass index should be between 23-30. Your Body mass index is 23.82 kg/m. If this is out of the aforementioned range listed, please consider follow up with your Primary Care Provider.  If you are age 73 or younger, your body mass index should be between 19-25. Your Body mass index is 23.82 kg/m. If this is out of the aformentioned range listed, please consider follow up with your Primary Care Provider.   Your provider has requested that you go to the basement level for lab work before leaving today. Press "B" on the elevator. The lab is located at the first door on the left as you exit the elevator.   You have been scheduled for an endoscopy. Please follow written instructions given to you at your visit today. If you use inhalers (even only as needed), please bring them with you on the day of your procedure.  Due to recent changes in healthcare laws, you may see the results of your imaging and laboratory studies on MyChart before your provider has had a chance to review them.  We understand that in some cases there may be results that are confusing or concerning to you. Not all laboratory results come back in the same time frame and the provider may be waiting for multiple results in order to interpret others.  Please give [place] 48 hours in order for your provider to thoroughly review all the results before contacting the office for clarification of your results.

## 2020-06-04 NOTE — Progress Notes (Signed)
06/04/2020 Jodi Valdez [id number] 01-29-1936   HISTORY OF PRESENT ILLNESS: This is an 84 year old female who is a patient of Dr. Steve Rattler.  She was last seen here in 2019.  She was sent here on this occasion by her PCP for evaluation regarding anemia/drop in hemoglobin.  Her hemoglobin in June was 11.7 g.  Then in August was down to 10.2 g.  She reports having intermittent very dark-colored stools including the past couple of days.  She was Hemoccult negative by her PCP back in August.  She admits to feeling very tired at times.  If she ever had an EGD then it was many, many years ago.  Her last colonoscopy was in November 2015 by Dr. Henrene Pastor at which time the exam was normal.  She does admit to using Motrin, 1 pill daily for the past few months at least for arthritis.  She also reports having a lot of indigestion recently despite taking omeprazole 40 mg daily, which she has been on for quite some time, approximately maybe a year or so.  Also reports some upper abdominal discomfort.   Past Medical History:  Diagnosis Date  . Allergy   . Anemia   . Asthma   . Cancer ([place]) 11/01/2000   left breast cancer  . GERD (gastroesophageal reflux disease)   . Hypertension   . Personal history of chemotherapy    2002  . Personal history of radiation therapy    2002  . Polymyalgia ([place])    Past Surgical History:  Procedure Laterality Date  . ABDOMINAL HYSTERECTOMY  1975  . BREAST SURGERY Left 11/01/2000   masectomy  . COLONOSCOPY  2015  . CYSTOCELE REPAIR  09/1999  . MASTECTOMY Left    2002 with tramflap  . RECTOCELE REPAIR  09/1999    reports that she has never smoked. She has never used smokeless tobacco. She reports that she does not drink alcohol and does not use drugs. family history is not on file. Allergies  Allergen Reactions  . Propranolol Other (See Comments)    Other reaction(s): Other (See Comments) hallucinations  . Codeine Nausea And Vomiting  . Nitrofuran  Derivatives Nausea And Vomiting      Outpatient Encounter Medications as of 06/04/2020  Medication Sig  . alendronate (FOSAMAX) 70 MG tablet Take 70 mg by mouth once a week.  Marland Kitchen amLODipine (NORVASC) 5 MG tablet Take 5 mg by mouth daily.  . benazepril (LOTENSIN) 40 MG tablet Take 40 mg by mouth daily.  . carvedilol (COREG) 3.125 MG tablet Take 3.125 mg by mouth 2 (two) times daily with a meal.   . Cholecalciferol (VITAMIN D3) 1000 UNITS CAPS Take 1,000 Units by mouth 2 (two) times daily.  Marland Kitchen EPINEPHrine 0.3 mg/0.3 mL IJ SOAJ injection Use as directed for life-threatening allergic reaction.  . furosemide (LASIX) 20 MG tablet 20 mg daily.  Marland Kitchen KLOR-CON M10 10 MEQ tablet 10 mEq daily.  Marland Kitchen levothyroxine (SYNTHROID, LEVOTHROID) 125 MCG tablet Take 62.5 mcg by mouth daily before breakfast.   . Multiple Vitamins-Minerals (PRESERVISION/LUTEIN PO) Take 1 tablet by mouth 2 (two) times daily.    Marland Kitchen omeprazole (PRILOSEC) 40 MG capsule Take one capsule every morning  . pravastatin (PRAVACHOL) 20 MG tablet Take 20 mg by mouth at bedtime.  . predniSONE (DELTASONE) 1 MG tablet Taking 8 mg by mouth daily. ( three 1mg  tablets and one 5mg  tablet)  . traZODone (DESYREL) 50 MG tablet 50 mg at bedtime as needed.  No facility-administered encounter medications on file as of 06/04/2020.     REVIEW OF SYSTEMS  : All other systems reviewed and negative except where noted in the History of Present Illness.   PHYSICAL EXAM: Ht 5\' 4"  (1.626 m)   Wt 138 lb 12.8 oz (63 kg)   BMI 23.82 kg/m  General: Well developed white female in no acute distress Head: Normocephalic and atraumatic Eyes:  Sclerae anicteric, conjunctiva pink. Ears: Normal auditory acuity Lungs: Clear throughout to auscultation; no W/R/R. Heart: Regular rate and rhythm; no M/R/G. Abdomen: Soft, non-distended.  BS present.  Mild upper abdominal TTP. Rectal:  External hemorrhoids noted.  DRE did not reveal any masses.  Small amount of light  brownish-green stool on exam glove that was trace heme positive. Musculoskeletal: Symmetrical with no gross deformities  Skin: No lesions on visible extremities Extremities: Mild swelling in B/L LEs, L>R. Neurological: Alert oriented x 4, grossly non-focal Psychological:  Alert and cooperative. Normal mood and affect  ASSESSMENT AND PLAN: *85 year old female who presents here today for evaluation of anemia, 1.5 gram drop in her Hgb from June to August.  Was heme negative by PCP back in August, but reports intermittent very dark stools (including the past few days), epigastric discomfort, and a lot of indigestion despite omeprazole 40 mg daily.  Does use motrin, one pill daily, for the past few months at least for arthritis.  Stools were light brown to greenish on exam today and were trace heme positive.  My suspicion is that this may be a subacute UGIB maybe from ulcer, etc.  Dr. Lyndel Safe had an opening for an EGD tomorrow so she is going to proceed with EGD at that time.  Will repeat CBC today since last was one month ago.  Pending results of EGD and repeat labs may need colonoscopy.  The risks, benefits, and alternatives to EGD were discussed with the patient and she consents to proceed.   CC:  Katherina Mires, MD

## 2020-06-05 ENCOUNTER — Other Ambulatory Visit: Payer: Self-pay

## 2020-06-05 ENCOUNTER — Ambulatory Visit (AMBULATORY_SURGERY_CENTER): Payer: Medicare PPO | Admitting: Gastroenterology

## 2020-06-05 ENCOUNTER — Encounter: Payer: Self-pay | Admitting: Gastroenterology

## 2020-06-05 VITALS — BP 135/70 | HR 84 | Temp 96.2°F | Resp 13 | Ht 64.0 in | Wt 138.0 lb

## 2020-06-05 DIAGNOSIS — K317 Polyp of stomach and duodenum: Secondary | ICD-10-CM | POA: Diagnosis not present

## 2020-06-05 DIAGNOSIS — K219 Gastro-esophageal reflux disease without esophagitis: Secondary | ICD-10-CM

## 2020-06-05 DIAGNOSIS — D509 Iron deficiency anemia, unspecified: Secondary | ICD-10-CM | POA: Diagnosis not present

## 2020-06-05 DIAGNOSIS — K319 Disease of stomach and duodenum, unspecified: Secondary | ICD-10-CM | POA: Diagnosis not present

## 2020-06-05 DIAGNOSIS — R1013 Epigastric pain: Secondary | ICD-10-CM

## 2020-06-05 DIAGNOSIS — K2970 Gastritis, unspecified, without bleeding: Secondary | ICD-10-CM | POA: Diagnosis not present

## 2020-06-05 MED ORDER — PANTOPRAZOLE SODIUM 40 MG PO TBEC: 40.0000 mg | DELAYED_RELEASE_TABLET | Freq: Every day | ORAL | 6 refills | Status: DC

## 2020-06-05 MED ORDER — SODIUM CHLORIDE 0.9 % IV SOLN: 500.0000 mL | INTRAVENOUS | Status: DC

## 2020-06-05 NOTE — Progress Notes (Signed)
V/s KW I have reviewed the patient's medical history in detail and updated the computerized patient record. 

## 2020-06-05 NOTE — Progress Notes (Signed)
Called to room to assist during endoscopic procedure.  Patient ID and intended procedure confirmed with present staff. Received instructions for my participation in the procedure from the performing physician.  

## 2020-06-05 NOTE — Progress Notes (Signed)
Report given to PACU, vss 

## 2020-06-05 NOTE — Patient Instructions (Signed)
YOU HAD AN ENDOSCOPIC PROCEDURE TODAY AT THE [facility]:   Refer to the procedure report that was given to you for any specific questions about what was found during the examination.  If the procedure report does not answer your questions, please call your gastroenterologist to clarify.  If you requested that your care partner not be given the details of your procedure findings, then the procedure report has been included in a sealed envelope for you to review at your convenience later.  YOU SHOULD EXPECT: Some feelings of bloating in the abdomen. Passage of more gas than usual.  Walking can help get rid of the air that was put into your GI tract during the procedure and reduce the bloating. If you had a lower endoscopy (such as a colonoscopy or flexible sigmoidoscopy) you may notice spotting of blood in your stool or on the toilet paper. If you underwent a bowel prep for your procedure, you may not have a normal bowel movement for a few days.  Please Note:  You might notice some irritation and congestion in your nose or some drainage.  This is from the oxygen used during your procedure.  There is no need for concern and it should clear up in a day or so.  SYMPTOMS TO REPORT IMMEDIATELY:    Following upper endoscopy (EGD)  Vomiting of blood or coffee ground material  New chest pain or pain under the shoulder blades  Painful or persistently difficult swallowing  New shortness of breath  Fever of 100F or higher  Black, tarry-looking stools  For urgent or emergent issues, a gastroenterologist can be reached at any hour by calling [phone]. Do not use MyChart messaging for urgent concerns.    DIET:  We do recommend a small meal at first, but then you may proceed to your regular diet.  Drink plenty of fluids but you should avoid alcoholic beverages for 24 hours.  ACTIVITY:  You should plan to take it easy for the rest of today and you should NOT DRIVE or use heavy machinery  until tomorrow (because of the sedation medicines used during the test).    FOLLOW UP: Our staff will call the number listed on your records 48-72 hours following your procedure to check on you and address any questions or concerns that you may have regarding the information given to you following your procedure. If we do not reach you, we will leave a message.  We will attempt to reach you two times.  During this call, we will ask if you have developed any symptoms of COVID 19. If you develop any symptoms (ie: fever, flu-like symptoms, shortness of breath, cough etc.) before then, please call [phone].  If you test positive for Covid 19 in the 2 weeks post procedure, please call and report this information to us.    If any biopsies were taken you will be contacted by phone or by letter within the next 1-3 weeks.  Please call us at [phone] if you have not heard about the biopsies in 3 weeks.    SIGNATURES/CONFIDENTIALITY: You and/or your care partner have signed paperwork which will be entered into your electronic medical record.  These signatures attest to the fact that that the information above on your After Visit Summary has been reviewed and is understood.  Full responsibility of the confidentiality of this discharge information lies with you and/or your care-partner. 

## 2020-06-05 NOTE — Progress Notes (Signed)
1000 Robinul 0.1 mg IV given due large amount of secretions upon assessment.  MD made aware, vss 

## 2020-06-05 NOTE — Op Note (Signed)
[facility] Patient Name: Jodi Valdez Procedure Date: 06/05/2020 9:55 AM MRN: [id number] Endoscopist: Jackquline Denmark , MD Age: 84 Referring MD:  Date of Birth: [birth year] Gender: Female Account #: [id number] Procedure:                Upper GI endoscopy Indications:              Epigastric abdominal pain, Iron deficiency anemia Medicines:                Monitored Anesthesia Care Procedure:                Pre-Anesthesia Assessment:                           - Prior to the procedure, a History and Physical                            was performed, and patient medications and                            allergies were reviewed. The patient's tolerance of                            previous anesthesia was also reviewed. The risks                            and benefits of the procedure and the sedation                            options and risks were discussed with the patient.                            All questions were answered, and informed consent                            was obtained. Prior Anticoagulants: The patient has                            taken no previous anticoagulant or antiplatelet                            agents. ASA Grade Assessment: II - A patient with                            mild systemic disease. After reviewing the risks                            and benefits, the patient was deemed in                            satisfactory condition to undergo the procedure.                           After obtaining informed consent, the endoscope was  passed under direct vision. Throughout the                            procedure, the patient's blood pressure, pulse, and                            oxygen saturations were monitored continuously. The                            Endoscope was introduced through the mouth, and                            advanced to the second part of duodenum. The upper                             GI endoscopy was accomplished without difficulty.                            The patient tolerated the procedure well. Scope In: Scope Out: Findings:                 The distal esophagus was mildly tortuous but normal                            with well defined Z-line at 35 cm, examined by NBI.                           Localized mild inflammation characterized by                            erosions and erythema was found in the gastric body                            and in the gastric antrum. Biopsies were taken with                            a cold forceps for histology.                           A single 6 mm sessile polyp with no bleeding and no                            stigmata of recent bleeding was found in the                            gastric fundus. The polyp was removed with a cold                            biopsy forceps. Resection and retrieval were                            complete.  The examined duodenum was normal. Biopsies for                            histology were taken with a cold forceps for                            evaluation of celiac disease. Complications:            No immediate complications. Estimated Blood Loss:     Estimated blood loss: none. Impression:               - Presbyesophagus.                           - Erosive Gastritis. Biopsied.                           - A single gastric polyp. Resected and retrieved.                           - Normal examined duodenum. Biopsied. Recommendation:           - Patient has a contact number available for                            emergencies. The signs and symptoms of potential                            delayed complications were discussed with the                            patient. Return to normal activities tomorrow.                            Written discharge instructions were provided to the                            patient.                           - Resume  previous diet.                           - Change omeprazole to Protonix 40mg  po QD #30, 6                            refills.                           - No ibuprofen, naproxen, or other non-steroidal                            anti-inflammatory drugs.                           - Await pathology results.                           -  Return to GI [facility] clinic in 4 weeks. If still with                            anemia, recommed colonoscopy.                           - The findings and recommendations were discussed                            with the patient's family. Jackquline Denmark, MD 06/05/2020 10:20:36 AM This report has been signed electronically.

## 2020-06-06 ENCOUNTER — Ambulatory Visit (INDEPENDENT_AMBULATORY_CARE_PROVIDER_SITE_OTHER): Payer: Medicare PPO | Admitting: *Deleted

## 2020-06-06 DIAGNOSIS — J309 Allergic rhinitis, unspecified: Secondary | ICD-10-CM | POA: Diagnosis not present

## 2020-06-07 ENCOUNTER — Telehealth: Payer: Self-pay

## 2020-06-07 ENCOUNTER — Telehealth: Payer: Self-pay | Admitting: *Deleted

## 2020-06-07 NOTE — Telephone Encounter (Signed)
1st follow up call made.  NALM 

## 2020-06-07 NOTE — Telephone Encounter (Signed)
  Follow up Call-  Call back number 06/05/2020  Post procedure Call Back phone  # [phone]  Permission to leave phone message Yes  Some recent data might be hidden     Patient questions:  Do you have a fever, pain , or abdominal swelling? No. Pain Score  0 *  Have you tolerated food without any problems? Yes.    Have you been able to return to your normal activities? Yes.    Do you have any questions about your discharge instructions: Diet   No. Medications  No. Follow up visit  No.  Do you have questions or concerns about your Care? No.  Actions: * If pain score is 4 or above: No action needed, pain <4.  1. Have you developed a fever since your procedure? no  2.   Have you had an respiratory symptoms (SOB or cough) since your procedure? no  3.   Have you tested positive for COVID 19 since your procedure no  4.   Have you had any family members/close contacts diagnosed with the COVID 19 since your procedure?  no   If yes to any of these questions please route to Joylene John, RN and Joella Prince, RN

## 2020-06-10 ENCOUNTER — Ambulatory Visit: Payer: Medicare PPO | Admitting: Cardiology

## 2020-06-13 ENCOUNTER — Telehealth: Payer: Self-pay | Admitting: Gastroenterology

## 2020-06-13 NOTE — Telephone Encounter (Signed)
Spoke to patient to inform her that dr Lyndel Safe does not have a sooner appointment. She recently had an EGD and will follow up on 08/09/20 to discuss next plan of care. Patient has been added to the cancellation list.

## 2020-06-17 ENCOUNTER — Other Ambulatory Visit: Payer: Self-pay

## 2020-06-17 ENCOUNTER — Ambulatory Visit (INDEPENDENT_AMBULATORY_CARE_PROVIDER_SITE_OTHER): Payer: Medicare PPO

## 2020-06-17 DIAGNOSIS — R0600 Dyspnea, unspecified: Secondary | ICD-10-CM | POA: Diagnosis not present

## 2020-06-17 DIAGNOSIS — K921 Melena: Secondary | ICD-10-CM | POA: Insufficient documentation

## 2020-06-17 DIAGNOSIS — R0609 Other forms of dyspnea: Secondary | ICD-10-CM

## 2020-06-17 LAB — ECHOCARDIOGRAM COMPLETE
Area-P 1/2: 7.16 cm2
Calc EF: 57.5 %
S' Lateral: 2.2 cm
Single Plane A2C EF: 55.8 %
Single Plane A4C EF: 56.4 %

## 2020-06-17 NOTE — Progress Notes (Signed)
Complete echocardiogram has been performed.  Jimmy Rani Sisney RDCS, RVT 

## 2020-06-29 ENCOUNTER — Encounter: Payer: Self-pay | Admitting: Gastroenterology

## 2020-07-04 ENCOUNTER — Ambulatory Visit (INDEPENDENT_AMBULATORY_CARE_PROVIDER_SITE_OTHER): Payer: Medicare PPO | Admitting: *Deleted

## 2020-07-04 DIAGNOSIS — J309 Allergic rhinitis, unspecified: Secondary | ICD-10-CM | POA: Diagnosis not present

## 2020-07-16 ENCOUNTER — Ambulatory Visit: Payer: Medicare PPO | Admitting: Gastroenterology

## 2020-08-01 ENCOUNTER — Other Ambulatory Visit: Payer: Self-pay

## 2020-08-01 ENCOUNTER — Ambulatory Visit (INDEPENDENT_AMBULATORY_CARE_PROVIDER_SITE_OTHER): Payer: Medicare PPO | Admitting: *Deleted

## 2020-08-01 DIAGNOSIS — J309 Allergic rhinitis, unspecified: Secondary | ICD-10-CM | POA: Diagnosis not present

## 2020-08-01 MED ORDER — PANTOPRAZOLE SODIUM 40 MG PO TBEC
40.0000 mg | DELAYED_RELEASE_TABLET | Freq: Every day | ORAL | 3 refills | Status: DC
Start: 2020-08-01 — End: 2021-06-19

## 2020-08-02 ENCOUNTER — Ambulatory Visit: Payer: Medicare PPO | Admitting: Cardiology

## 2020-08-07 ENCOUNTER — Encounter: Payer: Self-pay | Admitting: Allergy and Immunology

## 2020-08-09 ENCOUNTER — Ambulatory Visit: Payer: Medicare PPO | Admitting: Gastroenterology

## 2020-08-28 ENCOUNTER — Ambulatory Visit (INDEPENDENT_AMBULATORY_CARE_PROVIDER_SITE_OTHER): Payer: Medicare PPO | Admitting: Allergy and Immunology

## 2020-08-28 ENCOUNTER — Other Ambulatory Visit: Payer: Self-pay

## 2020-08-28 ENCOUNTER — Encounter: Payer: Self-pay | Admitting: Allergy and Immunology

## 2020-08-28 VITALS — BP 162/76 | HR 84 | Resp 14 | Ht 63.0 in | Wt 139.4 lb

## 2020-08-28 DIAGNOSIS — R0609 Other forms of dyspnea: Secondary | ICD-10-CM | POA: Diagnosis not present

## 2020-08-28 DIAGNOSIS — J4530 Mild persistent asthma, uncomplicated: Secondary | ICD-10-CM | POA: Diagnosis not present

## 2020-08-28 DIAGNOSIS — J3089 Other allergic rhinitis: Secondary | ICD-10-CM | POA: Diagnosis not present

## 2020-08-28 MED ORDER — METHYLPREDNISOLONE ACETATE 80 MG/ML IJ SUSP
80.0000 mg | Freq: Once | INTRAMUSCULAR | Status: AC
Start: 2020-08-28 — End: 2020-08-28
  Administered 2020-08-28: 80 mg via INTRAMUSCULAR

## 2020-08-28 MED ORDER — MONTELUKAST SODIUM 10 MG PO TABS: 10.0000 mg | ORAL_TABLET | Freq: Every day | ORAL | 5 refills | Status: DC

## 2020-08-28 MED ORDER — ALBUTEROL SULFATE HFA 108 (90 BASE) MCG/ACT IN AERS: INHALATION_SPRAY | RESPIRATORY_TRACT | 1 refills | Status: DC

## 2020-08-28 NOTE — Patient Instructions (Addendum)
  1.  Start therapy for inflammation:   A. Depomedrol 80 IM delivered in clinic today  B. Alvesco 160 - 1 inhalation 2 times per day with spacer  C. montelukast 10 mg 1 time per day  2. Continue treatment for reflux:   A.  Pantoprazole 40 mg tablet 1 time per day  3. If needed:   A. Zyrtec 10 mg - 1 tablet 1 time per day  B. Albuterol HFA - 2 inhalations every 4-6 hours  4. Review chest x-ray from July [facility] admission  5. Return to clinic in 10 days or earlier if problem

## 2020-08-28 NOTE — Progress Notes (Signed)
[facility] [place] [facility] [place]   Follow-up Note  Referring Provider: Suzan Garibaldi, FNP Primary Provider: Suzan Garibaldi, FNP Date of Office Visit: 08/28/2020  Subjective:   Jodi Valdez (DOB: Aug 04, 1936) is a 84 y.o. female who returns to the All[facility] and [place] on 08/28/2020 in re-evaluation of the following:  HPI: Jodi Valdez presents to this clinic in reevaluation of allergic rhinoconjunctivitis and a history of dyspnea and history of LPR.  Her last visit to this clinic was 06 Feb 2019.  She was undergoing a course of immunotherapy and was doing quite well regarding her airway issue and discontinued this form of therapy about 4 weeks ago because of logistical issue.  She states that over the course of the past week or 2 she just cannot breathe.  She gets very out of breath if she exerts herself and she is out of breath if she is just sitting.  She does not have any coughing or chest pain or sputum production or any significant upper airway symptoms and no issues with reflux.  Over the course of the past year she has had evaluation for her dyspnea with cardiology and was deemed to have some diastolic dysfunction but no other significant cardiac abnormality.  She still continues on daily prednisone for her polymyalgia rheumatica.  When I last saw her in this clinic she was having a significant issue with LPR/reflux and we treated her aggressively for that condition and all that issue had resolved and now she is just using pantoprazole on a consistent basis for reflux.  Allergies as of 08/28/2020      Reactions   Propranolol Other (See Comments)   Other reaction(s): Other (See Comments) hallucinations hallucinations Other reaction(s): Other (See Comments) hallucinations   Codeine Nausea And Vomiting   Nitrofuran Derivatives Nausea And Vomiting      Medication List    alendronate 70 MG tablet Commonly known as: FOSAMAX Take 70 mg by  mouth once a week.   amLODipine 5 MG tablet Commonly known as: NORVASC Take 5 mg by mouth in the morning and at bedtime.   ascorbic acid 1000 MG tablet Commonly known as: VITAMIN C Take by mouth.   benazepril 40 MG tablet Commonly known as: LOTENSIN Take 40 mg by mouth daily.   carvedilol 3.125 MG tablet Commonly known as: COREG Take 3.125 mg by mouth 2 (two) times daily with a meal.   cetirizine 10 MG tablet Commonly known as: ZYRTEC Take by mouth.   CRANBERRY PO Take 4,200 mg by mouth in the morning and at bedtime.   EPINEPHrine 0.3 mg/0.3 mL Soaj injection Commonly known as: EPI-PEN Use as directed for life-threatening allergic reaction.   furosemide 20 MG tablet Commonly known as: LASIX 20 mg daily.   gabapentin 100 MG capsule Commonly known as: NEURONTIN Take 100 mg by mouth 2 (two) times daily.   Klor-Con M10 10 MEQ tablet Generic drug: potassium chloride 10 mEq daily.   levothyroxine 125 MCG tablet Commonly known as: SYNTHROID Take 62.5 mcg by mouth in the morning.   meloxicam 15 MG tablet Commonly known as: MOBIC Take 15 mg by mouth daily.   pantoprazole 40 MG tablet Commonly known as: PROTONIX Take 1 tablet (40 mg total) by mouth daily.   pravastatin 20 MG tablet Commonly known as: PRAVACHOL Take 20 mg by mouth at bedtime.   predniSONE 1 MG tablet Commonly known as: DELTASONE Taking 8 mg by mouth daily. ( three 1mg   tablets and one 5mg  tablet)   predniSONE 5 MG tablet Commonly known as: DELTASONE   PRESERVISION/LUTEIN PO Take 1 tablet by mouth 2 (two) times daily.   tamsulosin 0.4 MG Caps capsule Commonly known as: FLOMAX   traZODone 50 MG tablet Commonly known as: DESYREL 50 mg at bedtime as needed.   Vitamin D3 25 MCG (1000 UT) Caps Take 1,000 Units by mouth 2 (two) times daily.       Past Medical History:  Diagnosis Date  . Allergy   . Anemia   . Asthma   . Cancer ([place]) 11/01/2000   left breast cancer  . GERD  (gastroesophageal reflux disease)   . Hypertension   . Personal history of chemotherapy    2002  . Personal history of radiation therapy    2002  . Polymyalgia ([place])     Past Surgical History:  Procedure Laterality Date  . ABDOMINAL HYSTERECTOMY  1975  . BREAST SURGERY Left 11/01/2000   masectomy  . COLONOSCOPY  2015  . CYSTOCELE REPAIR  09/1999  . MASTECTOMY Left    2002 with tramflap  . RECTOCELE REPAIR  09/1999    Review of systems negative except as noted in HPI / PMHx or noted below:  Review of Systems  Constitutional: Negative.   HENT: Negative.   Eyes: Negative.   Respiratory: Negative.   Cardiovascular: Negative.   Gastrointestinal: Negative.   Genitourinary: Negative.   Musculoskeletal: Negative.   Skin: Negative.   Neurological: Negative.   Endo/Heme/Allergies: Negative.   Psychiatric/Behavioral: Negative.      Objective:   Vitals:   08/28/20 1631 08/28/20 1633  BP: (!) 162/78 (!) 162/76  Pulse: 84   Resp: 14   SpO2: 95%    Height: 5\' 3"  (160 cm)  Weight: 139 lb 6.4 oz (63.2 kg)   Physical Exam Constitutional:      Appearance: She is not diaphoretic.  HENT:     Head: Normocephalic.     Right Ear: Tympanic membrane, ear canal and external ear normal.     Left Ear: Tympanic membrane, ear canal and external ear normal.     Nose: Nose normal. No mucosal edema or rhinorrhea.     Mouth/Throat:     Pharynx: Uvula midline. No oropharyngeal exudate.  Eyes:     Conjunctiva/sclera: Conjunctivae normal.  Neck:     Thyroid: No thyromegaly.     Trachea: Trachea normal. No tracheal tenderness or tracheal deviation.  Cardiovascular:     Rate and Rhythm: Normal rate and regular rhythm.     Heart sounds: Normal heart sounds, S1 normal and S2 normal. No murmur (Systolic) heard.   Pulmonary:     Effort: No respiratory distress.     Breath sounds: Normal breath sounds. No stridor. No wheezing or rales.  Lymphadenopathy:     Head:     Right side of head: No  tonsillar adenopathy.     Left side of head: No tonsillar adenopathy.     Cervical: No cervical adenopathy.  Skin:    Findings: No erythema or rash.     Nails: There is no clubbing.  Neurological:     Mental Status: She is alert.     Diagnostics:    Spirometry was performed and demonstrated an FEV1 of 2.14 at 120 % of predicted.  Oxygen saturation at rest on room air was 95%.  Oxygen saturation while walking the hallway on room air was 97%.  Assessment and Plan:   1. Not  well controlled mild persistent asthma   2. Other allergic rhinitis   3. Other form of dyspnea     1.  Start therapy for inflammation:   A. Depomedrol 80 IM delivered in clinic today  B. Alvesco 160 - 1 inhalation 2 times per day with spacer  C. montelukast 10 mg 1 time per day  2. Continue treatment for reflux:   A.  Pantoprazole 40 mg tablet 1 time per day  3. If needed:   A. Zyrtec 10 mg - 1 tablet 1 time per day  B. Albuterol HFA - 2 inhalations every 4-6 hours  4. Review chest x-ray from July [facility] admission  5. Return to clinic in 10 days or earlier if problem  It is not entirely clear why [place] was having significant issues with her breathing.  I will empirically treat her for inflammation of her airway and we will see what happens over the course of the next 10 days.  Allena Katz, MD Allergy / Immunology [place]

## 2020-08-29 ENCOUNTER — Encounter: Payer: Self-pay | Admitting: Allergy and Immunology

## 2020-09-04 ENCOUNTER — Ambulatory Visit: Payer: Medicare PPO | Admitting: Allergy and Immunology

## 2020-09-09 ENCOUNTER — Ambulatory Visit (INDEPENDENT_AMBULATORY_CARE_PROVIDER_SITE_OTHER): Payer: Medicare PPO | Admitting: Allergy and Immunology

## 2020-09-09 ENCOUNTER — Encounter: Payer: Self-pay | Admitting: Allergy and Immunology

## 2020-09-09 ENCOUNTER — Other Ambulatory Visit: Payer: Self-pay

## 2020-09-09 VITALS — BP 144/68 | HR 80 | Resp 16

## 2020-09-09 DIAGNOSIS — J4530 Mild persistent asthma, uncomplicated: Secondary | ICD-10-CM | POA: Diagnosis not present

## 2020-09-09 DIAGNOSIS — K219 Gastro-esophageal reflux disease without esophagitis: Secondary | ICD-10-CM

## 2020-09-09 DIAGNOSIS — J3089 Other allergic rhinitis: Secondary | ICD-10-CM | POA: Diagnosis not present

## 2020-09-09 NOTE — Patient Instructions (Addendum)
°  1.  Continue therapy for inflammation:   A. Decrease Alvesco 160 - 1 inhalation 1 time per day with spacer  C. montelukast 10 mg 1 time per day  2. Continue treatment for reflux:   A.  Pantoprazole 40 mg tablet 1 time per day  3. If needed:   A. Zyrtec 10 mg - 1 tablet 1 time per day  B. Albuterol HFA - 2 inhalations every 4-6 hours  4. Return to clinic in 12 weeks or earlier if problem

## 2020-09-09 NOTE — Progress Notes (Signed)
[facility] [place]   Follow-up Note  Referring Provider: Suzan Garibaldi, FNP Primary Provider: Suzan Garibaldi, FNP Date of Office Visit: 09/09/2020  Subjective:   Jodi Valdez (DOB: [birth year]) is a 84 y.o. female who returns to the Allergy and [place] on 09/09/2020 in re-evaluation of the following:  HPI: Jodi Valdez returns to this clinic in evaluation of allergic rhinoconjunctivitis and LPR.  I last saw her in this clinic on 28 August 2020 at which point in time she was dyspneic.  During her last visit we treated her with a systemic steroid and had her start inhaled steroids as well as a leukotriene modifier on a consistent basis and she continued on therapy for reflux/LPR with pantoprazole every day.  She is now 100% back to baseline and has no problems at all with her airway.  She can breathe just fine and does not get dyspneic if she exerts herself and is not using any short acting bronchodilator.  Her reflux remains under very good control.  Allergies as of 09/09/2020      Reactions   Propranolol Other (See Comments)   Other reaction(s): Other (See Comments) hallucinations hallucinations Other reaction(s): Other (See Comments) hallucinations   Codeine Nausea And Vomiting   Nitrofuran Derivatives Nausea And Vomiting      Medication List      albuterol 108 (90 Base) MCG/ACT inhaler Commonly known as: VENTOLIN HFA Can inhale two puffs every four to six hours as needed for cough, wheeze, shortness of breath or chest tightness.   alendronate 70 MG tablet Commonly known as: FOSAMAX Take 70 mg by mouth once a week.   amLODipine 5 MG tablet Commonly known as: NORVASC Take 5 mg by mouth in the morning and at bedtime.   ascorbic acid 1000 MG tablet Commonly known as: VITAMIN C Take by mouth.   benazepril 40 MG tablet Commonly known as: LOTENSIN Take 40 mg by mouth daily.   carvedilol 3.125 MG tablet Commonly  known as: COREG Take 3.125 mg by mouth 2 (two) times daily with a meal.   cetirizine 10 MG tablet Commonly known as: ZYRTEC Take by mouth.   CRANBERRY PO Take 4,200 mg by mouth in the morning and at bedtime.   EPINEPHrine 0.3 mg/0.3 mL Soaj injection Commonly known as: EPI-PEN Use as directed for life-threatening allergic reaction.   furosemide 20 MG tablet Commonly known as: LASIX 20 mg daily.   gabapentin 100 MG capsule Commonly known as: NEURONTIN Take 100 mg by mouth 2 (two) times daily.   Klor-Con M10 10 MEQ tablet Generic drug: potassium chloride 10 mEq daily.   levothyroxine 125 MCG tablet Commonly known as: SYNTHROID Take 62.5 mcg by mouth in the morning.   meloxicam 15 MG tablet Commonly known as: MOBIC Take 15 mg by mouth daily.   montelukast 10 MG tablet Commonly known as: SINGULAIR Take 1 tablet (10 mg total) by mouth at bedtime.   pantoprazole 40 MG tablet Commonly known as: PROTONIX Take 1 tablet (40 mg total) by mouth daily.   pravastatin 20 MG tablet Commonly known as: PRAVACHOL Take 20 mg by mouth at bedtime.   predniSONE 1 MG tablet Commonly known as: DELTASONE Taking 8 mg by mouth daily. ( three 1mg  tablets and one 5mg  tablet)   predniSONE 5 MG tablet Commonly known as: DELTASONE   PRESERVISION/LUTEIN PO Take 1 tablet by mouth 2 (two) times daily.   tamsulosin 0.4 MG Caps capsule Commonly  known as: FLOMAX   traZODone 50 MG tablet Commonly known as: DESYREL 50 mg at bedtime as needed.   Vitamin D3 25 MCG (1000 UT) Caps Take 1,000 Units by mouth 2 (two) times daily.       Past Medical History:  Diagnosis Date   Allergy    Anemia    Asthma    Cancer ([place]) 11/01/2000   left breast cancer   GERD (gastroesophageal reflux disease)    Hypertension    Personal history of chemotherapy    2002   Personal history of radiation therapy    2002   Polymyalgia [facility])     Past Surgical History:  Procedure Laterality Date    ABDOMINAL HYSTERECTOMY  1975   BREAST SURGERY Left 11/01/2000   masectomy   COLONOSCOPY  2015   CYSTOCELE REPAIR  09/1999   MASTECTOMY Left    2002 with tramflap   RECTOCELE REPAIR  09/1999    Review of systems negative except as noted in HPI / PMHx or noted below:  Review of Systems  Constitutional: Negative.   HENT: Negative.   Eyes: Negative.   Respiratory: Negative.   Cardiovascular: Negative.   Gastrointestinal: Negative.   Genitourinary: Negative.   Musculoskeletal: Negative.   Skin: Negative.   Neurological: Negative.   Endo/Heme/Allergies: Negative.   Psychiatric/Behavioral: Negative.      Objective:   Vitals:   09/09/20 1522  BP: (!) 144/68  Pulse: 80  Resp: 16  SpO2: 94%          Physical Exam Constitutional:      Appearance: She is not diaphoretic.  HENT:     Head: Normocephalic.     Right Ear: Tympanic membrane, ear canal and external ear normal.     Left Ear: Tympanic membrane, ear canal and external ear normal.     Nose: Nose normal. No mucosal edema or rhinorrhea.     Mouth/Throat:     Mouth: Oropharynx is clear and moist and mucous membranes are normal.     Pharynx: Uvula midline. No oropharyngeal exudate.  Eyes:     Conjunctiva/sclera: Conjunctivae normal.  Neck:     Thyroid: No thyromegaly.     Trachea: Trachea normal. No tracheal tenderness or tracheal deviation.  Cardiovascular:     Rate and Rhythm: Normal rate and regular rhythm.     Heart sounds: Normal heart sounds, S1 normal and S2 normal. No murmur heard.   Pulmonary:     Effort: No respiratory distress.     Breath sounds: Normal breath sounds. No stridor. No wheezing or rales.  Musculoskeletal:        General: No edema.  Lymphadenopathy:     Head:     Right side of head: No tonsillar adenopathy.     Left side of head: No tonsillar adenopathy.     Cervical: No cervical adenopathy.  Skin:    Findings: No erythema or rash.     Nails: There is no clubbing.   Neurological:     Mental Status: She is alert.     Diagnostics:    Spirometry was performed and demonstrated an FEV1 of 2.24 at 126 % of predicted.  Assessment and Plan:   1. Asthma, well controlled, mild persistent   2. Other allergic rhinitis   3. LPRD (laryngopharyngeal reflux disease)     1.  Continue therapy for inflammation:   A. Decrease Alvesco 160 - 1 inhalation 1 time per day with spacer  C. montelukast 10 mg  1 time per day  2. Continue treatment for reflux:   A.  Pantoprazole 40 mg tablet 1 time per day  3. If needed:   A. Zyrtec 10 mg - 1 tablet 1 time per day  B. Albuterol HFA - 2 inhalations every 4-6 hours  4. Return to clinic in 12 weeks or earlier if problem  Velisa appears to be doing much better at this point in time and I think there is an opportunity to consolidate some of her inhaled steroid dose and will have her use Alvesco just 1 time per day while she continues on montelukast and continues to treat her reflux with a proton pump inhibitor.  I will see her back in his clinic in 12 weeks. There may be a opportunity to further consolidate her treatment with that visit.  Allena Katz, MD Allergy / Immunology [place]

## 2020-09-10 ENCOUNTER — Encounter: Payer: Self-pay | Admitting: Allergy and Immunology

## 2020-11-14 ENCOUNTER — Other Ambulatory Visit: Payer: Self-pay | Admitting: Nurse Practitioner

## 2020-11-14 DIAGNOSIS — Z1231 Encounter for screening mammogram for malignant neoplasm of breast: Secondary | ICD-10-CM

## 2020-12-02 ENCOUNTER — Ambulatory Visit: Payer: Medicare PPO | Admitting: Allergy and Immunology

## 2021-01-06 ENCOUNTER — Ambulatory Visit: Payer: Medicare PPO

## 2021-03-26 ENCOUNTER — Institutional Professional Consult (permissible substitution): Payer: Medicare PPO | Admitting: Pulmonary Disease

## 2021-04-08 ENCOUNTER — Other Ambulatory Visit: Payer: Self-pay

## 2021-04-08 ENCOUNTER — Ambulatory Visit: Payer: Medicare PPO | Admitting: Pulmonary Disease

## 2021-04-08 ENCOUNTER — Encounter: Payer: Self-pay | Admitting: Pulmonary Disease

## 2021-04-08 VITALS — BP 142/68 | HR 107 | Ht 63.0 in | Wt 128.0 lb

## 2021-04-08 DIAGNOSIS — R0602 Shortness of breath: Secondary | ICD-10-CM | POA: Diagnosis not present

## 2021-04-08 MED ORDER — SPACER/AERO-HOLDING CHAMBERS DEVI: 0 refills | Status: DC

## 2021-04-08 MED ORDER — BREZTRI AEROSPHERE 160-9-4.8 MCG/ACT IN AERO: 2.0000 | INHALATION_SPRAY | Freq: Two times a day (BID) | RESPIRATORY_TRACT | 0 refills | Status: DC

## 2021-04-08 NOTE — Patient Instructions (Addendum)
--  START Breztri TWO puffs TWICE a day. USE a spacer --ORDER pulmonary function tests to rule out restrictive lung disease --Follow-up with NP to discuss results of PFTs

## 2021-04-08 NOTE — Progress Notes (Signed)
Subjective:   PATIENT ID: Jodi Valdez GENDER: female DOB: 07/21/1936, MRN: [id number]   HPI  Chief Complaint  Patient presents with   Consult    Shortness of breath since 2015 due to allergies, history of allergy shots    Reason for Visit: New consult for shortness of breath  85 year old year old never smoker with allergic rhinitis, LPR, hx breast cancer s/p chemoradiation and GERD who presents as a new consult for shortness of breath.  Began having shortness of breath one year. Not associated with cough or wheezing. Around this time, she was admitted for pneumonia. Did not need oxygen on discharge.  She will have symptoms at least once a week that limit her from doing housework, caring for her plants and three little dogs. At baseline she reports being active. On days she is more symptomatic she feels she cannot do anything and often wakes up feeling like she is having a bad day and has difficulty taking breath. Denies chest pain or tightness. No clear correlation with extremes in weather or humidity, pet exposure. Has tried albuterol for 2-3 weeks daily but did not have any relief. She reports her COVID-19 antibody tested positive and states she lost her sense of taste around May 2022.  Social History: Never smoker No child respiratory infections or diagnosis  I have personally reviewed patient's past medical/family/social history, allergies, current medications.  Past Medical History:  Diagnosis Date   Allergy    Anemia    Asthma    Breast cancer ([place])    Cancer ([place]) 11/01/2000   left breast cancer   GERD (gastroesophageal reflux disease)    Hyperlipidemia    Hypertension    Personal history of chemotherapy    2002   Personal history of radiation therapy    2002   Polymyalgia ([place])      Family History  Problem Relation Age of Onset   Colon cancer Neg Hx    Colon polyps Neg Hx    Diabetes Neg Hx    Kidney disease Neg Hx    Esophageal cancer Neg Hx       Social History   Occupational History   Occupation: Retired    Fish farm manager: RETIRED  Tobacco Use   Smoking status: Never   Smokeless tobacco: Never  Vaping Use   Vaping Use: Never used  Substance and Sexual Activity   Alcohol use: No    Alcohol/week: 0.0 standard drinks   Drug use: No   Sexual activity: Not on file    Allergies  Allergen Reactions   Propranolol Other (See Comments)    Other reaction(s): Other (See Comments) hallucinations hallucinations Other reaction(s): Other (See Comments) hallucinations   Codeine Nausea And Vomiting   Nitrofuran Derivatives Nausea And Vomiting     Outpatient Medications Prior to Visit  Medication Sig Dispense Refill   alendronate (FOSAMAX) 70 MG tablet Take 70 mg by mouth once a week.     amLODipine (NORVASC) 5 MG tablet Take 5 mg by mouth in the morning and at bedtime.      Ascorbic Acid (VITAMIN C PO) Take 1,000 mg by mouth daily.     benazepril (LOTENSIN) 40 MG tablet Take 40 mg by mouth daily.     carvedilol (COREG) 3.125 MG tablet Take 3.125 mg by mouth 2 (two) times daily with a meal.      Cholecalciferol (VITAMIN D3) 1000 UNITS CAPS Take 1,000 Units by mouth 2 (two) times daily.  EPINEPHrine 0.3 mg/0.3 mL IJ SOAJ injection Use as directed for life-threatening allergic reaction. 2 Device 3   furosemide (LASIX) 20 MG tablet 20 mg daily.     KLOR-CON M10 10 MEQ tablet 10 mEq daily.     levocetirizine (XYZAL) 5 MG tablet Take 5 mg by mouth daily.     levothyroxine (SYNTHROID) 125 MCG tablet Take 62.5 mcg by mouth in the morning.      Multiple Vitamins-Minerals (PRESERVISION/LUTEIN PO) Take 1 tablet by mouth 2 (two) times daily.     pantoprazole (PROTONIX) 40 MG tablet Take 1 tablet (40 mg total) by mouth daily. 90 tablet 3   pravastatin (PRAVACHOL) 20 MG tablet Take 20 mg by mouth at bedtime.     predniSONE (DELTASONE) 1 MG tablet Taking 8 mg by mouth daily. ( three 1mg  tablets and one 5mg  tablet)     predniSONE (DELTASONE)  5 MG tablet      traZODone (DESYREL) 50 MG tablet 50 mg at bedtime as needed.     albuterol (VENTOLIN HFA) 108 (90 Base) MCG/ACT inhaler Can inhale two puffs every four to six hours as needed for cough, wheeze, shortness of breath or chest tightness. (Patient not taking: Reported on 04/08/2021) 18 g 1   ascorbic acid (VITAMIN C) 1000 MG tablet Take by mouth.     cetirizine (ZYRTEC) 10 MG tablet Take by mouth. (Patient not taking: Reported on 04/08/2021)     CRANBERRY PO Take 4,200 mg by mouth in the morning and at bedtime. (Patient not taking: Reported on 04/08/2021)     gabapentin (NEURONTIN) 100 MG capsule Take 100 mg by mouth 2 (two) times daily.  (Patient not taking: Reported on 04/08/2021)     meloxicam (MOBIC) 15 MG tablet Take 15 mg by mouth daily.  (Patient not taking: Reported on 04/08/2021)     montelukast (SINGULAIR) 10 MG tablet Take 1 tablet (10 mg total) by mouth at bedtime. (Patient not taking: Reported on 04/08/2021) 30 tablet 5   tamsulosin (FLOMAX) 0.4 MG CAPS capsule  (Patient not taking: Reported on 04/08/2021)     No facility-administered medications prior to visit.    Review of Systems  Constitutional:  Negative for chills, diaphoresis, fever, malaise/fatigue and weight loss.  HENT:  Negative for congestion, ear pain and sore throat.   Respiratory:  Positive for shortness of breath. Negative for cough, hemoptysis, sputum production and wheezing.   Cardiovascular:  Negative for chest pain, palpitations and leg swelling.  Gastrointestinal:  Negative for abdominal pain, heartburn and nausea.  Genitourinary:  Negative for frequency.  Musculoskeletal:  Negative for joint pain and myalgias.  Skin:  Negative for itching and rash.  Neurological:  Negative for dizziness, weakness and headaches.  Endo/Heme/Allergies:  Positive for environmental allergies. Does not bruise/bleed easily.  Psychiatric/Behavioral:  Negative for depression. The patient is not nervous/anxious.      Objective:   Vitals:   04/08/21 1554  BP: (!) 142/68  Pulse: (!) 107  SpO2: 97%  Weight: 128 lb (58.1 kg)  Height: 5\' 3"  (1.6 m)   SpO2: 97 %  Physical Exam: General: Well-appearing, no acute distress HENT: [place], AT Eyes: EOMI, no scleral icterus Respiratory: Clear to auscultation bilaterally.  No crackles, wheezing or rales Cardiovascular: RRR, -M/R/G, no JVD Extremities:-Edema,-tenderness Neuro: AAO x4, CNII-XII grossly intact Psych: Normal mood, normal affect  Data Reviewed:  Imaging: CT chest/abdomen/pelvis 12/10/2017 (report only)- Mild bilateral dependent atelectasis.  Mild chronic subpleural scarring lingula.  Isolated paraseptal emphysema in the  medial upper lobe.  PFT: 09/09/2020 FVC 2.46 (103%) FEV1 2.24 (126%) Ratio 91  Interpretation: Normal spirometry  Labs: CBC    Component Value Date/Time   WBC 12.3 (H) 06/04/2020 1218   RBC 3.45 (L) 06/04/2020 1218   HGB 10.9 (L) 06/04/2020 1218   HCT 32.8 (L) 06/04/2020 1218   PLT 244.0 06/04/2020 1218   MCV 95.0 06/04/2020 1218   MCH 31.9 08/07/2014 0730   MCHC 33.3 06/04/2020 1218   RDW 13.5 06/04/2020 1218   LYMPHSABS 1.6 06/04/2020 1218   MONOABS 0.5 06/04/2020 1218   EOSABS 0.2 06/04/2020 1218   BASOSABS 0.1 06/04/2020 1218   Absolute eosinophils 06/04/2020-200     Assessment & Plan:   Discussion: 85 year old female with allergic rhinitis, LPR, hx breast cancer s/p chemoradiation and GERD who presents with shortness of breath >1 year. Probably COVID-19 infection in May, 3 months ago.  If PFTs are normal, will obtain CT Chest to evaluate reported emphysema and atypical infection. Will trial sample bronchodilator. If she has benefit, we can prescribe appropriate inhaler at next visit.  --START Breztri TWO puffs TWICE a day. USE a spacer --ORDER pulmonary function tests to rule out restrictive lung disease --Follow-up with NP to discuss results   Health Maintenance Immunization History   Administered Date(s) Administered   Influenza-Unspecified 05/22/2013   CT Lung Screen-not qualified never smoker  Orders Placed This Encounter  Procedures   Pulmonary Function Test    Standing Status:   Future    Standing Expiration Date:   04/08/2022    Scheduling Instructions:     Scheduled 05/16/2021 with follow up    Order Specific Question:   Where should this test be performed?    Answer:   [facility]    Order Specific Question:   Full PFT: includes the following: basic spirometry, spirometry pre & post bronchodilator, diffusion capacity (DLCO), lung volumes    Answer:   Full PFT   Meds ordered this encounter  Medications   Budeson-Glycopyrrol-Formoterol (BREZTRI AEROSPHERE) 160-9-4.8 MCG/ACT AERO    Sig: Inhale 2 puffs into the lungs in the morning and at bedtime.    Dispense:  10.7 g    Refill:  0   Spacer/Aero-Holding Chambers DEVI    Sig: As directed    Dispense:  1 each    Refill:  0    No follow-ups on file.  After PFTs  I have spent a total time of 45-minutes on the day of the appointment reviewing prior documentation, coordinating care and discussing medical diagnosis and plan with the patient/family. Past medical history, allergies, medications were reviewed. Pertinent imaging, labs and tests included in this note have been reviewed and interpreted independently by me.   [place], MD [facility] 04/08/2021 4:20 PM  Office Number [phone]

## 2021-04-14 DIAGNOSIS — K648 Other hemorrhoids: Secondary | ICD-10-CM | POA: Insufficient documentation

## 2021-05-16 ENCOUNTER — Ambulatory Visit: Payer: Medicare PPO | Admitting: Adult Health

## 2021-05-16 ENCOUNTER — Other Ambulatory Visit: Payer: Self-pay

## 2021-05-28 DIAGNOSIS — J019 Acute sinusitis, unspecified: Secondary | ICD-10-CM | POA: Diagnosis not present

## 2021-05-28 DIAGNOSIS — E11622 Type 2 diabetes mellitus with other skin ulcer: Secondary | ICD-10-CM | POA: Diagnosis not present

## 2021-05-28 DIAGNOSIS — R600 Localized edema: Secondary | ICD-10-CM | POA: Diagnosis not present

## 2021-05-28 DIAGNOSIS — L97201 Non-pressure chronic ulcer of unspecified calf limited to breakdown of skin: Secondary | ICD-10-CM | POA: Diagnosis not present

## 2021-05-28 DIAGNOSIS — T380X5A Adverse effect of glucocorticoids and synthetic analogues, initial encounter: Secondary | ICD-10-CM | POA: Diagnosis not present

## 2021-05-28 DIAGNOSIS — I10 Essential (primary) hypertension: Secondary | ICD-10-CM | POA: Diagnosis not present

## 2021-05-28 DIAGNOSIS — Z6821 Body mass index (BMI) 21.0-21.9, adult: Secondary | ICD-10-CM | POA: Diagnosis not present

## 2021-05-28 DIAGNOSIS — E099 Drug or chemical induced diabetes mellitus without complications: Secondary | ICD-10-CM | POA: Diagnosis not present

## 2021-06-04 ENCOUNTER — Ambulatory Visit: Payer: Medicare PPO | Admitting: Primary Care

## 2021-06-07 DIAGNOSIS — J320 Chronic maxillary sinusitis: Secondary | ICD-10-CM | POA: Diagnosis not present

## 2021-06-10 ENCOUNTER — Other Ambulatory Visit: Payer: Self-pay

## 2021-06-10 ENCOUNTER — Emergency Department (HOSPITAL_COMMUNITY): Payer: Medicare PPO

## 2021-06-10 ENCOUNTER — Encounter (HOSPITAL_COMMUNITY): Payer: Self-pay | Admitting: Emergency Medicine

## 2021-06-10 ENCOUNTER — Emergency Department (HOSPITAL_COMMUNITY)
Admission: EM | Admit: 2021-06-10 | Discharge: 2021-06-10 | Disposition: A | Discharge status: Home / Self Care | Payer: Medicare PPO | Attending: Emergency Medicine | Admitting: Emergency Medicine

## 2021-06-10 DIAGNOSIS — G4700 Insomnia, unspecified: Secondary | ICD-10-CM | POA: Insufficient documentation

## 2021-06-10 DIAGNOSIS — R5381 Other malaise: Secondary | ICD-10-CM | POA: Insufficient documentation

## 2021-06-10 DIAGNOSIS — R0981 Nasal congestion: Secondary | ICD-10-CM | POA: Diagnosis not present

## 2021-06-10 DIAGNOSIS — R5383 Other fatigue: Secondary | ICD-10-CM | POA: Insufficient documentation

## 2021-06-10 DIAGNOSIS — R0602 Shortness of breath: Secondary | ICD-10-CM | POA: Diagnosis not present

## 2021-06-10 DIAGNOSIS — Z5321 Procedure and treatment not carried out due to patient leaving prior to being seen by health care provider: Secondary | ICD-10-CM | POA: Diagnosis not present

## 2021-06-10 DIAGNOSIS — Z8616 Personal history of COVID-19: Secondary | ICD-10-CM | POA: Diagnosis not present

## 2021-06-10 DIAGNOSIS — R110 Nausea: Secondary | ICD-10-CM | POA: Diagnosis not present

## 2021-06-10 DIAGNOSIS — R630 Anorexia: Secondary | ICD-10-CM | POA: Insufficient documentation

## 2021-06-10 DIAGNOSIS — R9431 Abnormal electrocardiogram [ECG] [EKG]: Secondary | ICD-10-CM | POA: Diagnosis not present

## 2021-06-10 LAB — CBC WITH DIFFERENTIAL/PLATELET
Abs Immature Granulocytes: 0.18 10*3/uL — ABNORMAL HIGH (ref 0.00–0.07)
Basophils Absolute: 0 10*3/uL (ref 0.0–0.1)
Basophils Relative: 0 %
Eosinophils Absolute: 0 10*3/uL (ref 0.0–0.5)
Eosinophils Relative: 0 %
HCT: 40.1 % (ref 36.0–46.0)
Hemoglobin: 12.9 g/dL (ref 12.0–15.0)
Immature Granulocytes: 1 %
Lymphocytes Relative: 12 %
Lymphs Abs: 1.9 10*3/uL (ref 0.7–4.0)
MCH: 31.5 pg (ref 26.0–34.0)
MCHC: 32.2 g/dL (ref 30.0–36.0)
MCV: 97.8 fL (ref 80.0–100.0)
Monocytes Absolute: 0.6 10*3/uL (ref 0.1–1.0)
Monocytes Relative: 4 %
Neutro Abs: 12.9 10*3/uL — ABNORMAL HIGH (ref 1.7–7.7)
Neutrophils Relative %: 83 %
Platelets: 277 10*3/uL (ref 150–400)
RBC: 4.1 MIL/uL (ref 3.87–5.11)
RDW: 13.7 % (ref 11.5–15.5)
WBC: 15.5 10*3/uL — ABNORMAL HIGH (ref 4.0–10.5)
nRBC: 0 % (ref 0.0–0.2)

## 2021-06-10 LAB — BRAIN NATRIURETIC PEPTIDE: B Natriuretic Peptide: 66.6 pg/mL (ref 0.0–100.0)

## 2021-06-10 LAB — COMPREHENSIVE METABOLIC PANEL
ALT: 24 U/L (ref 0–44)
AST: 19 U/L (ref 15–41)
Albumin: 3.5 g/dL (ref 3.5–5.0)
Alkaline Phosphatase: 41 U/L (ref 38–126)
Anion gap: 9 (ref 5–15)
BUN: 23 mg/dL (ref 8–23)
CO2: 24 mmol/L (ref 22–32)
Calcium: 9.6 mg/dL (ref 8.9–10.3)
Chloride: 106 mmol/L (ref 98–111)
Creatinine, Ser: 0.91 mg/dL (ref 0.44–1.00)
GFR, Estimated: >60 mL/min (ref >60)
Glucose, Bld: 199 mg/dL — ABNORMAL HIGH (ref 70–99)
Potassium: 5 mmol/L (ref 3.5–5.1)
Sodium: 139 mmol/L (ref 135–145)
Total Bilirubin: 0.7 mg/dL (ref 0.3–1.2)
Total Protein: 6.4 g/dL — ABNORMAL LOW (ref 6.5–8.1)

## 2021-06-10 LAB — TSH: TSH: 2.082 u[IU]/mL (ref 0.350–4.500)

## 2021-06-10 NOTE — ED Provider Notes (Signed)
Emergency Medicine Provider Triage Evaluation Note  Jodi Valdez , a 85 y.o. female  was evaluated in triage.  Pt complains of nasal congestion, nausea, and post nasal drip.  She was started on Augmentin for this by urgent care with out relief.  She is not taking any allergy medications.  She is taking zyrtec with out relief.     She says that she is tired of her symptoms.    Review of Systems  Positive: Post nasal drip, congestion Negative: Fevers  Physical Exam  BP (!) 169/85 (BP Location: Right Arm)   Pulse (!) 102   Temp 97.9 F (36.6 C) (Oral)   Resp 16   Ht 5' (1.524 m)   Wt 53.5 kg   SpO2 99%   BMI 23.05 kg/m  Gen:   Awake, no distress   Resp:  Normal effort  MSK:   Moves extremities without difficulty  Other:  Speech is not slurred.   Medical Decision Making  Medically screening exam initiated at 1:22 PM.  Appropriate orders placed.  Jodi Valdez was informed that the remainder of the evaluation will be completed by another provider, this initial triage assessment does not replace that evaluation, and the importance of remaining in the ED until their evaluation is complete.     Lorin Glass, PA-C 06/10/21 1326    Godfrey Pick, MD 06/10/21 [phone]

## 2021-06-10 NOTE — ED Triage Notes (Signed)
Pt states that she had Covid in June and has had fatigue, malaise, no appetite and hair loss since that time along with insomnia. Pt was prescribed amoxicillin recently for the nasal drainage she has constantly but has not helped. Alert and oriented.

## 2021-06-10 NOTE — ED Notes (Signed)
Pt ambulatory to bathroom w/ stand-by assistance.

## 2021-06-10 NOTE — ED Notes (Signed)
Eloped from treatment room. Ambulatory. No distress noted.

## 2021-06-13 DIAGNOSIS — R519 Headache, unspecified: Secondary | ICD-10-CM | POA: Diagnosis not present

## 2021-06-17 DIAGNOSIS — I10 Essential (primary) hypertension: Secondary | ICD-10-CM | POA: Diagnosis not present

## 2021-06-17 DIAGNOSIS — Z7952 Long term (current) use of systemic steroids: Secondary | ICD-10-CM | POA: Diagnosis not present

## 2021-06-17 DIAGNOSIS — R5383 Other fatigue: Secondary | ICD-10-CM | POA: Diagnosis not present

## 2021-06-17 DIAGNOSIS — U071 COVID-19: Secondary | ICD-10-CM | POA: Diagnosis not present

## 2021-06-17 DIAGNOSIS — R0982 Postnasal drip: Secondary | ICD-10-CM | POA: Diagnosis not present

## 2021-06-17 DIAGNOSIS — Z79899 Other long term (current) drug therapy: Secondary | ICD-10-CM | POA: Diagnosis not present

## 2021-06-17 DIAGNOSIS — J310 Chronic rhinitis: Secondary | ICD-10-CM | POA: Diagnosis not present

## 2021-06-17 DIAGNOSIS — J3489 Other specified disorders of nose and nasal sinuses: Secondary | ICD-10-CM | POA: Diagnosis not present

## 2021-06-17 DIAGNOSIS — Z8616 Personal history of COVID-19: Secondary | ICD-10-CM | POA: Diagnosis not present

## 2021-06-17 DIAGNOSIS — Z7983 Long term (current) use of bisphosphonates: Secondary | ICD-10-CM | POA: Diagnosis not present

## 2021-06-17 DIAGNOSIS — Z853 Personal history of malignant neoplasm of breast: Secondary | ICD-10-CM | POA: Diagnosis not present

## 2021-06-17 DIAGNOSIS — K219 Gastro-esophageal reflux disease without esophagitis: Secondary | ICD-10-CM | POA: Diagnosis not present

## 2021-06-17 DIAGNOSIS — J45909 Unspecified asthma, uncomplicated: Secondary | ICD-10-CM | POA: Diagnosis not present

## 2021-06-18 DIAGNOSIS — H6123 Impacted cerumen, bilateral: Secondary | ICD-10-CM | POA: Diagnosis not present

## 2021-06-18 DIAGNOSIS — J019 Acute sinusitis, unspecified: Secondary | ICD-10-CM | POA: Diagnosis not present

## 2021-06-19 ENCOUNTER — Other Ambulatory Visit: Payer: Self-pay | Admitting: Gastroenterology

## 2021-06-23 NOTE — Progress Notes (Deleted)
@Patient  ID: Jodi Valdez, female    DOB: Jun 26, 1936, 86 y.o.   MRN: [id number]  No chief complaint on file.   Referring provider: Leonides Sake, MD  HPI: 85 year old female, never smoked.  Past medical history significant for pretension, GERD, hyperthyroidism, personal history of breast cancer.  Patient of Dr. Loanne Drilling, seen for initial consult in July 2022 for shortness of breath.  Previous [facility] pulmonary encounters: 04/08/21- DR. Loanne Drilling  Chief Complaint  Patient presents with   Consult    Shortness of breath since 2015 due to allergies, history of allergy shots    Reason for Visit: New consult for shortness of breath 85 year old year old never smoker with allergic rhinitis, LPR, hx breast cancer s/p chemoradiation and GERD who presents as a new consult for shortness of breath.  Began having shortness of breath one year. Not associated with cough or wheezing. Around this time, she was admitted for pneumonia. Did not need oxygen on discharge.  She will have symptoms at least once a week that limit her from doing housework, caring for her plants and three little dogs. At baseline she reports being active. On days she is more symptomatic she feels she cannot do anything and often wakes up feeling like she is having a bad day and has difficulty taking breath. Denies chest pain or tightness. No clear correlation with extremes in weather or humidity, pet exposure. Has tried albuterol for 2-3 weeks daily but did not have any relief. She reports her COVID-19 antibody tested positive and states she lost her sense of taste around May 2022.  Discussion: 85 year old female with allergic rhinitis, LPR, hx breast cancer s/p chemoradiation and GERD who presents with shortness of breath >1 year. Probably COVID-19 infection in May, 3 months ago.   If PFTs are normal, will obtain CT Chest to evaluate reported emphysema and atypical infection. Will trial sample bronchodilator. If she has  benefit, we can prescribe appropriate inhaler at next visit.   --START Breztri TWO puffs TWICE a day. USE a spacer --ORDER pulmonary function tests to rule out restrictive lung disease --Follow-up with NP to discuss results   06/24/2021- Interim hx Patient presents today for 3 month follow-up.  During last OV with Dr. Loanne Drilling she was started on [facility] and ordered for PFTs to rule out restrictive lung disease.           Allergies  Allergen Reactions   Propranolol Other (See Comments)    Other reaction(s): Other (See Comments) hallucinations hallucinations Other reaction(s): Other (See Comments) hallucinations   Codeine Nausea And Vomiting   Nitrofuran Derivatives Nausea And Vomiting    Immunization History  Administered Date(s) Administered   Influenza-Unspecified 05/22/2013    Past Medical History:  Diagnosis Date   Allergy    Anemia    Asthma    Breast cancer ([place])    Cancer ([place]) 11/01/2000   left breast cancer   GERD (gastroesophageal reflux disease)    Hyperlipidemia    Hypertension    Personal history of chemotherapy    2002   Personal history of radiation therapy    2002   Polymyalgia ([place])     Tobacco History: Social History   Tobacco Use  Smoking Status Never  Smokeless Tobacco Never   Counseling given: Not Answered   Outpatient Medications Prior to Visit  Medication Sig Dispense Refill   alendronate (FOSAMAX) 70 MG tablet Take 70 mg by mouth once a week.  amLODipine (NORVASC) 5 MG tablet Take 5 mg by mouth in the morning and at bedtime.      Ascorbic Acid (VITAMIN C PO) Take 1,000 mg by mouth daily.     ascorbic acid (VITAMIN C) 1000 MG tablet Take by mouth.     benazepril (LOTENSIN) 40 MG tablet Take 40 mg by mouth daily.     Budeson-Glycopyrrol-Formoterol (BREZTRI AEROSPHERE) 160-9-4.8 MCG/ACT AERO Inhale 2 puffs into the lungs in the morning and at bedtime. 10.7 g 0   carvedilol (COREG) 3.125 MG tablet Take 3.125 mg by mouth 2  (two) times daily with a meal.      Cholecalciferol (VITAMIN D3) 1000 UNITS CAPS Take 1,000 Units by mouth 2 (two) times daily.     EPINEPHrine 0.3 mg/0.3 mL IJ SOAJ injection Use as directed for life-threatening allergic reaction. 2 Device 3   furosemide (LASIX) 20 MG tablet 20 mg daily.     KLOR-CON M10 10 MEQ tablet 10 mEq daily.     levocetirizine (XYZAL) 5 MG tablet Take 5 mg by mouth daily.     levothyroxine (SYNTHROID) 125 MCG tablet Take 62.5 mcg by mouth in the morning.      Multiple Vitamins-Minerals (PRESERVISION/LUTEIN PO) Take 1 tablet by mouth 2 (two) times daily.     pantoprazole (PROTONIX) 40 MG tablet Take 1 tablet (40 mg total) by mouth daily. Please call [phone] to schedule an appointment for more refills. 90 tablet 1   pravastatin (PRAVACHOL) 20 MG tablet Take 20 mg by mouth at bedtime.     predniSONE (DELTASONE) 1 MG tablet Taking 8 mg by mouth daily. ( three 1mg  tablets and one 5mg  tablet)     predniSONE (DELTASONE) 5 MG tablet      Spacer/Aero-Holding Chambers DEVI As directed 1 each 0   traZODone (DESYREL) 50 MG tablet 50 mg at bedtime as needed.     No facility-administered medications prior to visit.      Review of Systems  Review of Systems   Physical Exam  There were no vitals taken for this visit. Physical Exam   Lab Results:  CBC    Component Value Date/Time   WBC 15.5 (H) 06/10/2021 1333   RBC 4.10 06/10/2021 1333   HGB 12.9 06/10/2021 1333   HCT 40.1 06/10/2021 1333   PLT 277 06/10/2021 1333   MCV 97.8 06/10/2021 1333   MCH 31.5 06/10/2021 1333   MCHC 32.2 06/10/2021 1333   RDW 13.7 06/10/2021 1333   LYMPHSABS 1.9 06/10/2021 1333   MONOABS 0.6 06/10/2021 1333   EOSABS 0.0 06/10/2021 1333   BASOSABS 0.0 06/10/2021 1333    BMET    Component Value Date/Time   NA 139 06/10/2021 1333   K 5.0 06/10/2021 1333   CL 106 06/10/2021 1333   CO2 24 06/10/2021 1333   GLUCOSE 199 (H) 06/10/2021 1333   BUN 23 06/10/2021 1333   CREATININE  0.91 06/10/2021 1333   CALCIUM 9.6 06/10/2021 1333   GFRNONAA >60 06/10/2021 1333   GFRAA 66 (L) 08/07/2014 0730    BNP    Component Value Date/Time   BNP 66.6 06/10/2021 1333    ProBNP    Component Value Date/Time   PROBNP 289 05/23/2020 1056    Imaging: DG Chest 2 View  Result Date: 06/10/2021 CLINICAL DATA:  Shortness of breath since COVID diagnosis in June 2022 EXAM: CHEST - 2 VIEW COMPARISON:  Chest x-ray and chest CT 02/17/2019 FINDINGS: The cardiac silhouette, mediastinal and hilar contours  are within normal limits. Mild tortuosity and calcification of the thoracic aorta noted. No acute pulmonary findings. No pulmonary lesions. No pleural effusions. Surgical changes from a partial left mastectomy. The bony thorax is intact. IMPRESSION: No acute cardiopulmonary findings. Electronically Signed   By: Marijo Sanes M.D.   On: 06/10/2021 14:14     Assessment & Plan:   No problem-specific Assessment & Plan notes found for this encounter.     Martyn Ehrich, NP 06/23/2021

## 2021-06-24 ENCOUNTER — Ambulatory Visit: Payer: Medicare PPO | Admitting: Primary Care

## 2021-07-18 DIAGNOSIS — I110 Hypertensive heart disease with heart failure: Secondary | ICD-10-CM | POA: Diagnosis not present

## 2021-07-18 DIAGNOSIS — I50810 Right heart failure, unspecified: Secondary | ICD-10-CM | POA: Diagnosis not present

## 2021-07-18 DIAGNOSIS — I272 Pulmonary hypertension, unspecified: Secondary | ICD-10-CM | POA: Diagnosis not present

## 2021-07-18 DIAGNOSIS — E860 Dehydration: Secondary | ICD-10-CM | POA: Diagnosis not present

## 2021-07-18 DIAGNOSIS — R0602 Shortness of breath: Secondary | ICD-10-CM | POA: Diagnosis not present

## 2021-07-18 DIAGNOSIS — I700 Atherosclerosis of aorta: Secondary | ICD-10-CM | POA: Diagnosis not present

## 2021-07-18 DIAGNOSIS — E041 Nontoxic single thyroid nodule: Secondary | ICD-10-CM | POA: Diagnosis not present

## 2021-07-19 DIAGNOSIS — I50810 Right heart failure, unspecified: Secondary | ICD-10-CM | POA: Diagnosis not present

## 2021-07-19 DIAGNOSIS — I272 Pulmonary hypertension, unspecified: Secondary | ICD-10-CM | POA: Diagnosis not present

## 2021-07-19 DIAGNOSIS — I110 Hypertensive heart disease with heart failure: Secondary | ICD-10-CM | POA: Diagnosis not present

## 2021-07-19 DIAGNOSIS — R001 Bradycardia, unspecified: Secondary | ICD-10-CM | POA: Diagnosis not present

## 2021-07-20 DIAGNOSIS — I110 Hypertensive heart disease with heart failure: Secondary | ICD-10-CM | POA: Diagnosis not present

## 2021-07-20 DIAGNOSIS — R001 Bradycardia, unspecified: Secondary | ICD-10-CM | POA: Diagnosis not present

## 2021-07-20 DIAGNOSIS — I50810 Right heart failure, unspecified: Secondary | ICD-10-CM | POA: Diagnosis not present

## 2021-07-20 DIAGNOSIS — I272 Pulmonary hypertension, unspecified: Secondary | ICD-10-CM | POA: Diagnosis not present

## 2021-07-21 ENCOUNTER — Ambulatory Visit: Payer: Medicare PPO | Admitting: Primary Care

## 2021-07-21 DIAGNOSIS — R0602 Shortness of breath: Secondary | ICD-10-CM | POA: Diagnosis not present

## 2021-07-21 DIAGNOSIS — I272 Pulmonary hypertension, unspecified: Secondary | ICD-10-CM | POA: Diagnosis not present

## 2021-07-21 DIAGNOSIS — I110 Hypertensive heart disease with heart failure: Secondary | ICD-10-CM | POA: Diagnosis not present

## 2021-07-21 DIAGNOSIS — R079 Chest pain, unspecified: Secondary | ICD-10-CM | POA: Diagnosis not present

## 2021-07-21 DIAGNOSIS — I50810 Right heart failure, unspecified: Secondary | ICD-10-CM | POA: Diagnosis not present

## 2021-07-26 DIAGNOSIS — E7800 Pure hypercholesterolemia, unspecified: Secondary | ICD-10-CM | POA: Diagnosis not present

## 2021-07-26 DIAGNOSIS — Z8673 Personal history of transient ischemic attack (TIA), and cerebral infarction without residual deficits: Secondary | ICD-10-CM | POA: Diagnosis not present

## 2021-07-26 DIAGNOSIS — I10 Essential (primary) hypertension: Secondary | ICD-10-CM | POA: Diagnosis not present

## 2021-07-26 DIAGNOSIS — R0602 Shortness of breath: Secondary | ICD-10-CM | POA: Diagnosis not present

## 2021-07-26 DIAGNOSIS — R0600 Dyspnea, unspecified: Secondary | ICD-10-CM | POA: Diagnosis not present

## 2021-07-29 DIAGNOSIS — R0600 Dyspnea, unspecified: Secondary | ICD-10-CM | POA: Diagnosis not present

## 2021-07-29 DIAGNOSIS — Z79899 Other long term (current) drug therapy: Secondary | ICD-10-CM | POA: Diagnosis not present

## 2021-07-29 DIAGNOSIS — N644 Mastodynia: Secondary | ICD-10-CM | POA: Diagnosis not present

## 2021-07-29 DIAGNOSIS — M899 Disorder of bone, unspecified: Secondary | ICD-10-CM | POA: Diagnosis not present

## 2021-07-29 DIAGNOSIS — Z853 Personal history of malignant neoplasm of breast: Secondary | ICD-10-CM | POA: Diagnosis not present

## 2021-07-29 DIAGNOSIS — F32A Depression, unspecified: Secondary | ICD-10-CM | POA: Diagnosis not present

## 2021-07-29 DIAGNOSIS — Z7689 Persons encountering health services in other specified circumstances: Secondary | ICD-10-CM | POA: Diagnosis not present

## 2021-07-29 DIAGNOSIS — I700 Atherosclerosis of aorta: Secondary | ICD-10-CM | POA: Diagnosis not present

## 2021-07-29 DIAGNOSIS — Z6820 Body mass index (BMI) 20.0-20.9, adult: Secondary | ICD-10-CM | POA: Diagnosis not present

## 2021-07-31 ENCOUNTER — Other Ambulatory Visit: Payer: Self-pay | Admitting: Family Medicine

## 2021-07-31 DIAGNOSIS — N644 Mastodynia: Secondary | ICD-10-CM

## 2021-08-01 ENCOUNTER — Telehealth: Payer: Self-pay | Admitting: *Deleted

## 2021-08-01 ENCOUNTER — Encounter: Payer: Self-pay | Admitting: *Deleted

## 2021-08-01 ENCOUNTER — Ambulatory Visit
Admission: RE | Admit: 2021-08-01 | Discharge: 2021-08-01 | Disposition: A | Discharge status: Home / Self Care | Payer: Self-pay | Source: Ambulatory Visit | Attending: Oncology | Admitting: Oncology

## 2021-08-01 ENCOUNTER — Other Ambulatory Visit: Payer: Self-pay | Admitting: *Deleted

## 2021-08-01 DIAGNOSIS — R933 Abnormal findings on diagnostic imaging of other parts of digestive tract: Secondary | ICD-10-CM

## 2021-08-01 NOTE — Progress Notes (Signed)
New Hematology/Oncology Consult   Requesting MD: Dr. Daiva Eves  [phone]  Reason for Consult: History of breast cancer  HPI: Jodi Valdez was last seen by Dr. Benay Spice 01/06/2016.  She has a history of stage III left-sided breast cancer diagnosed February 2002.  She completed treatment on the NSABP-B 42 study in November 2012.  She was felt to be in clinical remission at the time of her last visit.  She did not return for scheduled follow-up.  She was hospitalized at [facility] 07/18/2021 through 07/21/2021 with shortness of breath.  CT chest showed aortic atherosclerosis, no focal airspace disease or PE, sternal manubrial joint showed changes that could be degenerative versus a sclerotic metastasis, new since May 2020 study.  2D echo on 07/19/2021 showed borderline concentric left ventricular hypertrophy; left ventricular systolic function mild/moderately impaired with an EF between 40 to 45%.  Lexiscan on 07/20/2021 showed left ventricular ejection fraction 52%, normal left ventricular wall motion, no reversible ischemia.   Diagnostic right mammogram 03/06/2021 showed a 5 mm oval hypoechoic lesion at the right breast 10:00 4 cm from the nipple, likely fibroadenoma.  She had an ultrasound of the right breast which we do not have the report from.  Follow-up diagnostic right mammogram scheduled 08/28/2021.  Left breast ultrasound 03/26/2021 showed no mammographic evidence of malignancy involving the reconstructed left breast.  Costal cartilage of an anterior left rib noted to be present in the area of focal pain in the lower reconstructed left breast.  She is accompanied to today's visit by her husband.  They report an approximate 6-week history of dyspnea at rest and on exertion.  No fever or cough.  She has occasional sweats.  She notes leg swelling.  Energy level is poor.  Appetite has been poor over the past 6 weeks.  Her husband estimates she has lost 10 to 12 pounds.  She is trying to  drink nutritional supplements.  She denies pain at the sternum.  No back pain.  She has occasional sharp pain both breasts.  The pain does not occur on a daily basis.   Past Medical History:  Diagnosis Date   Allergy    Anemia    Asthma    Breast cancer ([place])    Cancer ([place]) 11/01/2000   left breast cancer   GERD (gastroesophageal reflux disease)    Hyperlipidemia    Hypertension    Personal history of chemotherapy    2002   Personal history of radiation therapy    2002   Polymyalgia [facility])      Past Surgical History:  Procedure Laterality Date   ABDOMINAL HYSTERECTOMY  1975   BREAST SURGERY Left 11/01/2000   masectomy   COLONOSCOPY  2015   CYSTOCELE REPAIR  09/1999   MASTECTOMY Left    2002 with tramflap   RECTOCELE REPAIR  09/1999  :   Current Outpatient Medications:    azelastine (ASTELIN) 0.1 % nasal spray, Place into both nostrils as needed., Disp: , Rfl:    famotidine (PEPCID) 20 MG tablet, Take 20 mg by mouth 2 (two) times daily., Disp: , Rfl:    losartan (COZAAR) 50 MG tablet, Take 50 mg by mouth daily., Disp: , Rfl:    alendronate (FOSAMAX) 70 MG tablet, Take 70 mg by mouth once a week., Disp: , Rfl:    Ascorbic Acid (VITAMIN C PO), Take 1,000 mg by mouth daily., Disp: , Rfl:    carvedilol (COREG) 3.125 MG tablet, Take 6.25 mg by mouth  2 (two) times daily with a meal., Disp: , Rfl:    Cholecalciferol (VITAMIN D3) 1000 UNITS CAPS, Take 1,000 Units by mouth 2 (two) times daily., Disp: , Rfl:    EPINEPHrine 0.3 mg/0.3 mL IJ SOAJ injection, Use as directed for life-threatening allergic reaction., Disp: 2 Device, Rfl: 3   furosemide (LASIX) 20 MG tablet, 20 mg every other day., Disp: , Rfl:    levothyroxine (SYNTHROID) 125 MCG tablet, Take 88 mcg by mouth in the morning., Disp: , Rfl:    LORazepam (ATIVAN) 1 MG tablet, Take by mouth as needed., Disp: , Rfl:    Multiple Vitamins-Minerals (PRESERVISION/LUTEIN PO), Take 1 tablet by mouth 2 (two) times daily., Disp: , Rfl:     pravastatin (PRAVACHOL) 20 MG tablet, Take 20 mg by mouth at bedtime., Disp: , Rfl:    predniSONE (DELTASONE) 1 MG tablet, Taking ( three 1mg  tablets and one 5mg  tablet) 8 mg Monday, Wed, Friday, then 7mg  other days, Disp: , Rfl:    predniSONE (DELTASONE) 5 MG tablet, , Disp: , Rfl:    Spacer/Aero-Holding Chambers DEVI, As directed, Disp: 1 each, Rfl: 0   traZODone (DESYREL) 50 MG tablet, 50 mg at bedtime as needed., Disp: , Rfl: :    Allergies  Allergen Reactions   Propranolol Other (See Comments)    Other reaction(s): Other (See Comments) hallucinations hallucinations Other reaction(s): Other (See Comments) hallucinations hallucinations Other reaction(s): Other (See Comments) hallucinations  Other reaction(s): Other (See Comments) hallucinations hallucinations Other reaction(s): Other (See Comments) hallucinations   Codeine Nausea And Vomiting   Nitrofuran Derivatives Nausea And Vomiting  :  FH: No family history of malignancy.  SOCIAL HISTORY: She lives in [place].  She is married.  No tobacco or alcohol use.  Review of Systems: She reports a 6-week history of shortness of breath, both at rest and with exertion.  No fever or cough.  Energy level is poor.  Occasional sweats.  She has lost weight.  Appetite has been diminished over the past 6 weeks.  She drinks nutritional supplements.  She denies pain at the sternum.  She has intermittent bilateral breast pain.  The pain does not occur on a daily basis.  She reports a diagnostic mammogram is scheduled in [place] on 08/28/2021.  No change in bowel habits.  No urinary symptoms.  She notes leg edema.  Physical Exam:  Blood pressure (!) 144/64, pulse 95, temperature 98.1 F (36.7 C), temperature source Oral, resp. rate 18, height 5\' 4"  (1.626 m), weight 121 lb 6.4 oz (55.1 kg), SpO2 100 %.  HEENT: No thrush or ulcers. Lungs: Lungs clear bilaterally. Cardiac: Regular rate and rhythm. Abdomen: No  hepatosplenomegaly. Vascular: Pitting edema lower leg bilaterally. Lymph nodes: No palpable cervical, supraocular, axillary or inguinal lymph nodes. Breast: Right breast without mass.  Status post left mastectomy with TRAM reconstruction.  No evidence for chest wall tumor recurrence.   LABS:  No results for input(s): WBC, HGB, HCT, PLT in the last 72 hours.  No results for input(s): NA, K, CL, CO2, GLUCOSE, BUN, CREATININE, CALCIUM in the last 72 hours.    RADIOLOGY:  No results found.  Assessment and Plan:   1.History of stage III left-sided breast cancer diagnosed in February 2002-she completed treatment on the NSABP-B. 42 study in November of 2012. She remains in clinical remission.   2. History of osteopenia 3. Polymyalgia rheumatica-followed by rheumatology   4. Dyspnea, leg edema 5.  CT chest 07/18/2021-no pulmonary embolism.  No focal airspace disease.  Age-indeterminate superior endplate compression deformity of T3 new since May 2020.  Sclerosis within the lower manubrium at the sternomanubrial joint new since May 2020. 6.  2D echo 07/19/2021-left ventricular systolic function mildly-moderately impaired with EF between 40 to 45%.  Mitral Doppler inflow pattern suggest diastolic filling abnormality.  No regional wall motion abnormalities were noted. 7. Lexiscan 07/20/2021-left ventricular ejection fraction 52%, normal left ventricular wall motion, no reversible ischemia.   Jodi Valdez has a remote history of stage III left-sided breast cancer.  She completed treatment on the NSABP-B 42 study November 2012.  Etiology of the area of sclerosis within the lower manubrium is unclear.  Dr. Benay Spice feels it is unlikely this represents cancer.  She is not experiencing pain in this area.  The dyspnea is new over the past 6 weeks.  No obvious source on the recent chest CT.  She has leg edema.  The recent 2D echo showed a decrease in ejection fraction.  We recommend she follow-up with  cardiology.  She will return for follow-up here in 6 months.  We are available to see her sooner if needed.  Patient seen with Dr. Benay Spice.  Ned Card, NP 08/04/2021, 12:09 PM   This was a shared visit with Ned Card.  Jodi Valdez was interviewed and examined.  She has a remote history of left-sided breast cancer.  She remains in clinical remission.  She is referred for evaluation of new onset dyspnea.  A chest CT revealed a T3 compression deformity and an area of sclerosis in the sternum-new from 2020.  I have a low clinical suspicion for metastatic breast cancer.  The dyspnea may be related to heart disease.  She has leg edema and an echocardiogram last month revealed a decreased ejection fraction.  We recommend she follow-up with her cardiologist.  She plans to follow-up with cardiology and Dr. Lisbeth Ply to evaluate her symptoms.  We are available to see her sooner if there is a concern she has metastatic breast cancer.  I was present for greater than 50% of today's visit.  I performed medical decision making.  Jodi Manson, MD

## 2021-08-01 NOTE — Progress Notes (Signed)
PATIENT NAVIGATOR PROGRESS NOTE  Name: Jodi Valdez Date: 08/01/2021 MRN: [id number]  DOB: 05-07-1936   Reason for visit:  Phone visit based on referral from Dr. Onnie Boer office [place]  Comments:  Called and spoke with Mr and Mrs Golliday re: referral from Dr Lisbeth Ply. [name] was a patient of Dr Benay Spice until 2017 and treated for breast cancer. Since 2017 she has been seen at [facility] in [place] and [facility]. She was recently hospitalized for SOB at [facility]. Husband states that they were told on D/C that they needed a F/U diagnostic mammogram of the right breast due to some changes seen. The patient called for appt and has a diagnostic mammogram scheduled for 12/8 at [facility].  Patient and husband would like to reestablish care with Dr Benay Spice.  Have requested outside CT of chest and other films be available to Dr Hillery Aldo for his review and will contact [name] with New Pt appt    Time spent counseling/coordinating care: 30-45 minutes

## 2021-08-04 ENCOUNTER — Other Ambulatory Visit: Payer: Self-pay

## 2021-08-04 ENCOUNTER — Encounter: Payer: Self-pay | Admitting: Nurse Practitioner

## 2021-08-04 ENCOUNTER — Inpatient Hospital Stay: Payer: Medicare PPO | Attending: Nurse Practitioner | Admitting: Nurse Practitioner

## 2021-08-04 VITALS — BP 144/64 | HR 95 | Temp 98.1°F | Resp 18 | Ht 64.0 in | Wt 121.4 lb

## 2021-08-04 DIAGNOSIS — Z170 Estrogen receptor positive status [ER+]: Secondary | ICD-10-CM

## 2021-08-04 DIAGNOSIS — R933 Abnormal findings on diagnostic imaging of other parts of digestive tract: Secondary | ICD-10-CM | POA: Diagnosis not present

## 2021-08-04 DIAGNOSIS — C50912 Malignant neoplasm of unspecified site of left female breast: Secondary | ICD-10-CM | POA: Diagnosis not present

## 2021-08-06 ENCOUNTER — Encounter: Payer: Self-pay | Admitting: *Deleted

## 2021-08-06 DIAGNOSIS — J45909 Unspecified asthma, uncomplicated: Secondary | ICD-10-CM | POA: Insufficient documentation

## 2021-08-06 DIAGNOSIS — Z9221 Personal history of antineoplastic chemotherapy: Secondary | ICD-10-CM | POA: Insufficient documentation

## 2021-08-06 DIAGNOSIS — E785 Hyperlipidemia, unspecified: Secondary | ICD-10-CM | POA: Insufficient documentation

## 2021-08-06 DIAGNOSIS — M353 Polymyalgia rheumatica: Secondary | ICD-10-CM | POA: Insufficient documentation

## 2021-08-06 DIAGNOSIS — C50919 Malignant neoplasm of unspecified site of unspecified female breast: Secondary | ICD-10-CM | POA: Insufficient documentation

## 2021-08-06 DIAGNOSIS — T7840XA Allergy, unspecified, initial encounter: Secondary | ICD-10-CM | POA: Insufficient documentation

## 2021-08-06 DIAGNOSIS — Z923 Personal history of irradiation: Secondary | ICD-10-CM | POA: Insufficient documentation

## 2021-08-07 DIAGNOSIS — H353212 Exudative age-related macular degeneration, right eye, with inactive choroidal neovascularization: Secondary | ICD-10-CM | POA: Diagnosis not present

## 2021-08-07 DIAGNOSIS — H353121 Nonexudative age-related macular degeneration, left eye, early dry stage: Secondary | ICD-10-CM | POA: Diagnosis not present

## 2021-08-07 DIAGNOSIS — H2511 Age-related nuclear cataract, right eye: Secondary | ICD-10-CM | POA: Diagnosis not present

## 2021-08-08 ENCOUNTER — Encounter: Payer: Self-pay | Admitting: Cardiology

## 2021-08-08 ENCOUNTER — Other Ambulatory Visit: Payer: Self-pay

## 2021-08-08 ENCOUNTER — Ambulatory Visit: Payer: Medicare PPO | Admitting: Cardiology

## 2021-08-08 VITALS — BP 150/76 | HR 65 | Ht 64.5 in | Wt 125.2 lb

## 2021-08-08 DIAGNOSIS — C801 Malignant (primary) neoplasm, unspecified: Secondary | ICD-10-CM | POA: Diagnosis not present

## 2021-08-08 DIAGNOSIS — E782 Mixed hyperlipidemia: Secondary | ICD-10-CM

## 2021-08-08 DIAGNOSIS — R0609 Other forms of dyspnea: Secondary | ICD-10-CM

## 2021-08-08 DIAGNOSIS — I10 Essential (primary) hypertension: Secondary | ICD-10-CM

## 2021-08-08 DIAGNOSIS — I255 Ischemic cardiomyopathy: Secondary | ICD-10-CM | POA: Diagnosis not present

## 2021-08-08 DIAGNOSIS — I429 Cardiomyopathy, unspecified: Secondary | ICD-10-CM | POA: Insufficient documentation

## 2021-08-08 MED ORDER — FUROSEMIDE 40 MG PO TABS: 40.0000 mg | ORAL_TABLET | Freq: Every day | ORAL | 1 refills | Status: DC

## 2021-08-08 MED ORDER — POTASSIUM CHLORIDE ER 10 MEQ PO TBCR: 10.0000 meq | EXTENDED_RELEASE_TABLET | Freq: Every day | ORAL | 1 refills | Status: DC

## 2021-08-08 NOTE — Progress Notes (Signed)
Cardiology Office Note:    Date:  08/08/2021   ID:  Jodi, Valdez [birth year], MRN [id number]  PCP:  Leonides Sake, MD  Cardiologist:  Jenne Campus, MD    Referring MD: Leonides Sake, MD   Chief Complaint  Patient presents with   Shortness of Breath        Leg Swelling    History of Present Illness:    Jodi Valdez is a 85 y.o. female with past medical history significant for essential hypertension, breast cancer, cardiomyopathy with ejection fraction 50 to 55% but recently admitted to [facility] because of shortness of breath and swollen legs he was find to have ejection fraction 40 to 45%.  She did have a stress test done which showed no evidence of ischemia but potentially old myocardial infarction involving the basal portion of the inferior wall.  While she was in the hospital she was evaluated for pulmonary emboli with CT which was negative. She comes today to my office for follow-up.  She seems to be not doing well still complain of being tired exhausted swelling of lower extremities as well as shortness of breath.  She overall appears to be uncomfortable.  She does not check her weight.  There is no proximal nocturnal dyspnea but she said that all her life she have difficulty sleeping at night.  Past Medical History:  Diagnosis Date   Allergy    Anemia    Asthma    Breast cancer ([place])    Cancer ([place]) 11/01/2000   left breast cancer   GERD (gastroesophageal reflux disease)    Hyperlipidemia    Hypertension    Personal history of chemotherapy    2002   Personal history of radiation therapy    2002   Polymyalgia [facility])     Past Surgical History:  Procedure Laterality Date   ABDOMINAL HYSTERECTOMY  1975   BREAST SURGERY Left 11/01/2000   masectomy   COLONOSCOPY  2015   CYSTOCELE REPAIR  09/1999   MASTECTOMY Left    2002 with tramflap   RECTOCELE REPAIR  09/1999    Current Medications: Current Meds  Medication Sig    alendronate (FOSAMAX) 70 MG tablet Take 70 mg by mouth once a week.   Ascorbic Acid (VITAMIN C PO) Take 1,000 mg by mouth daily.   azelastine (ASTELIN) 0.1 % nasal spray Place 1 spray into both nostrils as needed.   carvedilol (COREG) 3.125 MG tablet Take 6.25 mg by mouth 2 (two) times daily with a meal.   Cholecalciferol (VITAMIN D3) 1000 UNITS CAPS Take 1,000 Units by mouth 2 (two) times daily.   EPINEPHrine 0.3 mg/0.3 mL IJ SOAJ injection Use as directed for life-threatening allergic reaction. (Patient taking differently: Inject 0.3 mg into the muscle as needed for anaphylaxis. Use as directed for life-threatening allergic reaction.)   famotidine (PEPCID) 20 MG tablet Take 20 mg by mouth 2 (two) times daily.   furosemide (LASIX) 20 MG tablet Take 20 mg by mouth every other day.   levothyroxine (SYNTHROID) 125 MCG tablet Take 88 mcg by mouth in the morning.   LORazepam (ATIVAN) 1 MG tablet Take by mouth as needed for anxiety.   losartan (COZAAR) 50 MG tablet Take 50 mg by mouth daily.   Multiple Vitamins-Minerals (PRESERVISION/LUTEIN PO) Take 1 tablet by mouth 2 (two) times daily. Unknown strength   pravastatin (PRAVACHOL) 20 MG tablet Take 20 mg by mouth at bedtime.   predniSONE (DELTASONE) 1 MG tablet  Take 1 mg by mouth See admin instructions. Taking ( three 1mg  tablets and one 5mg  tablet) 8 mg Monday, Wed, Friday, then 7mg  other days   predniSONE (DELTASONE) 5 MG tablet Take 5 mg by mouth daily with breakfast.   Spacer/Aero-Holding Dorise Bullion As directed (Patient taking differently: 1 each by Other route See admin instructions. As directed/ SOB)   traZODone (DESYREL) 50 MG tablet Take 50 mg by mouth at bedtime as needed for sleep.     Allergies:   Propranolol, Codeine, and Nitrofuran derivatives   Social History   Socioeconomic History   Marital status: Married    Spouse name: Not on file   Number of children: 2   Years of education: Not on file   Highest education level: Not on  file  Occupational History   Occupation: Retired    Fish farm manager: RETIRED  Tobacco Use   Smoking status: Never   Smokeless tobacco: Never  Vaping Use   Vaping Use: Never used  Substance and Sexual Activity   Alcohol use: No    Alcohol/week: 0.0 standard drinks   Drug use: No   Sexual activity: Not on file  Other Topics Concern   Not on file  Social History Narrative   Not on file   Social Determinants of Health   Financial Resource Strain: Low Risk    Difficulty of Paying Living Expenses: Not hard at all  Food Insecurity: No Food Insecurity   Worried About Charity fundraiser in the Last Year: Never true   [place] in the Last Year: Never true  Transportation Needs: No Transportation Needs   Lack of Transportation (Medical): No   Lack of Transportation (Non-Medical): No  Physical Activity: Inactive   Days of Exercise per Week: 0 days   Minutes of Exercise per Session: 0 min  Stress: Stress Concern Present   Feeling of Stress : To some extent  Social Connections: Moderately Integrated   Frequency of Communication with Friends and Family: Twice a week   Frequency of Social Gatherings with Friends and Family: Twice a week   Attends Religious Services: More than 4 times per year   Active Member of [employer] or Organizations: No   Attends Music therapist: Never   Marital Status: Married     Family History: The patient's family history is negative for Colon cancer, Colon polyps, Diabetes, Kidney disease, and Esophageal cancer. ROS:   Please see the history of present illness.    All 14 point review of systems negative except as described per history of present illness  EKGs/Labs/Other Studies Reviewed:      Recent Labs: 06/10/2021: ALT 24; B Natriuretic Peptide 66.6; BUN 23; Creatinine, Ser 0.91; Hemoglobin 12.9; Platelets 277; Potassium 5.0; Sodium 139; TSH 2.082  Recent Lipid Panel No results found for: CHOL, TRIG, HDL, CHOLHDL, VLDL, LDLCALC,  LDLDIRECT  Physical Exam:    VS:  BP (!) 150/76 (BP Location: Right Arm, Patient Position: Sitting)   Pulse 65   Ht 5' 4.5" (1.638 m)   Wt 125 lb 3.2 oz (56.8 kg)   SpO2 97%   BMI 21.16 kg/m     Wt Readings from Last 3 Encounters:  08/08/21 125 lb 3.2 oz (56.8 kg)  08/04/21 121 lb 6.4 oz (55.1 kg)  06/10/21 118 lb (53.5 kg)     GEN:  Well nourished, well developed in no acute distress HEENT: Normal NECK: No JVD; No carotid bruits LYMPHATICS: No lymphadenopathy CARDIAC: RRR, no  murmurs, no rubs, no gallops RESPIRATORY:  Clear to auscultation without rales, wheezing or rhonchi  ABDOMEN: Soft, non-tender, non-distended MUSCULOSKELETAL:  No edema; No deformity  SKIN: Warm and dry LOWER EXTREMITIES: 1+ swelling NEUROLOGIC:  Alert and oriented x 3 PSYCHIATRIC:  Normal affect   ASSESSMENT:    1. Essential hypertension   2. Ischemic cardiomyopathy   3. Dyspnea on exertion   4. Mixed hyperlipidemia   5. Cancer [facility])    PLAN:    In order of problems listed above:  Cardiomyopathy with ejection fraction 40 to 45%, she is already on carvedilol as well as Cozaar 50.  I will check a Chem-7 today, I will increase dose of furosemide from 20 to 40 mg daily.  Chem-7 need to be repeated within next few days.  If Chem-7 is fine I will switch her from Cozaar to [place].  She is already on small dose of beta-blocker.  Today I will also check a proBNP. Essential hypertension blood pressure is elevated hopefully with will get better with addition of diuretic. Mixed dyslipidemia: I did review her K PN which show me her LDL of 83 HDL 48 however this is from last year.  Will redo the test. She does have some history of depression that may be contributing to her overall dysthymia   Medication Adjustments/Labs and Tests Ordered: Current medicines are reviewed at length with the patient today.  Concerns regarding medicines are outlined above.  No orders of the defined types were placed in this  encounter.  Medication changes: No orders of the defined types were placed in this encounter.   Signed, Park Liter, MD, [facility] 08/08/2021 11:52 AM    [place]

## 2021-08-08 NOTE — Patient Instructions (Signed)
Medication Instructions:  Your physician has recommended you make the following change in your medication:  INCREASE: Lasix to 40 mg daily   START: Potassium 10 meq daily   *If you need a refill on your cardiac medications before your next appointment, please call your pharmacy*   Lab Work: Your physician recommends that you return for lab work today: bmp, pro bnp   And in 1 week : BMP    If you have labs (blood work) drawn today and your tests are completely normal, you will receive your results only by: Fernando Salinas (if you have MyChart) OR A paper copy in the mail If you have any lab test that is abnormal or we need to change your treatment, we will call you to review the results.   Testing/Procedures: None   Follow-Up: At [facility], you and your health needs are our priority.  As part of our continuing mission to provide you with exceptional heart care, we have created designated Provider Care Teams.  These Care Teams include your primary Cardiologist (physician) and Advanced Practice Providers (APPs -  Physician Assistants and Nurse Practitioners) who all work together to provide you with the care you need, when you need it.  We recommend signing up for the patient portal called "MyChart".  Sign up information is provided on this After Visit Summary.  MyChart is used to connect with patients for Virtual Visits (Telemedicine).  Patients are able to view lab/test results, encounter notes, upcoming appointments, etc.  Non-urgent messages can be sent to your provider as well.   To learn more about what you can do with MyChart, go to [email or web address].    Your next appointment:   6 week(s)  The format for your next appointment:   In Person  Provider:   Jenne Campus, MD    Other Instructions

## 2021-08-09 LAB — BASIC METABOLIC PANEL
BUN/Creatinine Ratio: 25 (ref 12–28)
BUN: 25 mg/dL (ref 8–27)
CO2: 24 mmol/L (ref 20–29)
Calcium: 8.9 mg/dL (ref 8.7–10.3)
Chloride: 103 mmol/L (ref 96–106)
Creatinine, Ser: 0.99 mg/dL (ref 0.57–1.00)
Glucose: 109 mg/dL — ABNORMAL HIGH (ref 70–99)
Potassium: 4.8 mmol/L (ref 3.5–5.2)
Sodium: 139 mmol/L (ref 134–144)
eGFR: 57 mL/min/{1.73_m2} — ABNORMAL LOW (ref >59)

## 2021-08-09 LAB — PRO B NATRIURETIC PEPTIDE: NT-Pro BNP: 1911 pg/mL — ABNORMAL HIGH (ref 0–738)

## 2021-08-11 ENCOUNTER — Telehealth: Payer: Self-pay

## 2021-08-11 NOTE — Telephone Encounter (Addendum)
Spoke with patient, aware of results. Patient stated she is already on Lasix 40 mg qd. Per Dr. Raliegh Ip verbally advised to increase Lasix to 60 mg daily and to increase Potassium CL from 10 meq to 54meq qd. LM to return my call.

## 2021-08-11 NOTE — Telephone Encounter (Signed)
-----   Message from Park Liter, MD sent at 08/11/2021 10:09 AM EST ----- Blood pressure check showing decompensated CHF, if we have been increasing furosemide yet we need to go from 20 mg daily to 40 mg daily and she need to have a Chem-7 done within the next week or so.

## 2021-08-12 ENCOUNTER — Other Ambulatory Visit: Payer: Self-pay

## 2021-08-12 ENCOUNTER — Telehealth: Payer: Self-pay | Admitting: Cardiology

## 2021-08-12 ENCOUNTER — Telehealth: Payer: Self-pay

## 2021-08-12 DIAGNOSIS — E782 Mixed hyperlipidemia: Secondary | ICD-10-CM

## 2021-08-12 DIAGNOSIS — E7800 Pure hypercholesterolemia, unspecified: Secondary | ICD-10-CM | POA: Diagnosis not present

## 2021-08-12 DIAGNOSIS — E1169 Type 2 diabetes mellitus with other specified complication: Secondary | ICD-10-CM | POA: Diagnosis not present

## 2021-08-12 DIAGNOSIS — K219 Gastro-esophageal reflux disease without esophagitis: Secondary | ICD-10-CM | POA: Diagnosis not present

## 2021-08-12 DIAGNOSIS — E039 Hypothyroidism, unspecified: Secondary | ICD-10-CM | POA: Diagnosis not present

## 2021-08-12 DIAGNOSIS — Z6821 Body mass index (BMI) 21.0-21.9, adult: Secondary | ICD-10-CM | POA: Diagnosis not present

## 2021-08-12 DIAGNOSIS — F330 Major depressive disorder, recurrent, mild: Secondary | ICD-10-CM | POA: Diagnosis not present

## 2021-08-12 DIAGNOSIS — I5020 Unspecified systolic (congestive) heart failure: Secondary | ICD-10-CM | POA: Diagnosis not present

## 2021-08-12 LAB — BASIC METABOLIC PANEL
BUN/Creatinine Ratio: 29 — ABNORMAL HIGH (ref 12–28)
BUN: 30 mg/dL — ABNORMAL HIGH (ref 8–27)
CO2: 27 mmol/L (ref 20–29)
Calcium: 9.4 mg/dL (ref 8.7–10.3)
Chloride: 101 mmol/L (ref 96–106)
Creatinine, Ser: 1.03 mg/dL — ABNORMAL HIGH (ref 0.57–1.00)
Glucose: 146 mg/dL — ABNORMAL HIGH (ref 70–99)
Potassium: 4 mmol/L (ref 3.5–5.2)
Sodium: 140 mmol/L (ref 134–144)
eGFR: 54 mL/min/{1.73_m2} — ABNORMAL LOW (ref >59)

## 2021-08-12 MED ORDER — FUROSEMIDE 40 MG PO TABS: 60.0000 mg | ORAL_TABLET | Freq: Every day | ORAL | 1 refills | Status: DC

## 2021-08-12 MED ORDER — POTASSIUM CHLORIDE CRYS ER 20 MEQ PO TBCR: 20.0000 meq | EXTENDED_RELEASE_TABLET | Freq: Every day | ORAL | 1 refills | Status: DC

## 2021-08-12 NOTE — Telephone Encounter (Signed)
New Message:     Please call asap, about the change that was made in patient Potassium medicine this morning.

## 2021-08-12 NOTE — Telephone Encounter (Signed)
Patient informed of the following instructions per Dr. Raliegh Ip: Per Dr. Raliegh Ip verbally advised to increase Lasix to 60 mg daily and to increase Potassium CL form 10 meq to 35meq qd. Medication sent with instructions.

## 2021-08-12 NOTE — Telephone Encounter (Signed)
Left message for patient to return call.

## 2021-08-12 NOTE — Telephone Encounter (Signed)
-----   Message from Darrel Reach, [place] sent at 08/11/2021 11:31 AM EST -----  ----- Message ----- From: Park Liter, MD Sent: 08/11/2021  10:09 AM EST To: Senaida Ores, RN  Blood pressure check showing decompensated CHF, if we have been increasing furosemide yet we need to go from 20 mg daily to 40 mg daily and she need to have a Chem-7 done within the next week or so.

## 2021-08-13 ENCOUNTER — Other Ambulatory Visit: Payer: Self-pay

## 2021-08-13 ENCOUNTER — Ambulatory Visit: Payer: Medicare PPO | Admitting: Cardiology

## 2021-08-13 ENCOUNTER — Encounter: Payer: Self-pay | Admitting: Cardiology

## 2021-08-13 VITALS — BP 130/64 | HR 102 | Ht 61.0 in | Wt 117.6 lb

## 2021-08-13 DIAGNOSIS — I255 Ischemic cardiomyopathy: Secondary | ICD-10-CM

## 2021-08-13 DIAGNOSIS — E782 Mixed hyperlipidemia: Secondary | ICD-10-CM | POA: Diagnosis not present

## 2021-08-13 DIAGNOSIS — I10 Essential (primary) hypertension: Secondary | ICD-10-CM

## 2021-08-13 DIAGNOSIS — Z9221 Personal history of antineoplastic chemotherapy: Secondary | ICD-10-CM | POA: Diagnosis not present

## 2021-08-13 NOTE — Telephone Encounter (Signed)
Spoke to patient husband per dpr. He reports that his questions was already answered by pharmacy no further needs at this time.

## 2021-08-13 NOTE — Progress Notes (Signed)
Cardiology Office Note:    Date:  08/13/2021   ID:  ANNSLEIGH DRAGOO, DOB 1936-06-13, MRN [id number]  PCP:  Leonides Sake, MD  Cardiologist:  Jenne Campus, MD    Referring MD: Leonides Sake, MD   No chief complaint on file. Doing fine  History of Present Illness:    Jodi Valdez is a 85 y.o. female  with past medical history significant for essential hypertension, breast cancer, cardiomyopathy with ejection fraction 50 to 55% but recently admitted to [facility] because of shortness of breath and swollen legs he was find to have ejection fraction 40 to 45%.  She did have a stress test done which showed no evidence of ischemia but potentially old myocardial infarction involving the basal portion of the inferior wall.  While she was in the hospital she was evaluated for pulmonary emboli with CT which was negative. She was given extra diuretic seems to be urinating quite well.  Kidney function still preserved, she lost significant mount of weight started feeling better but she said last night was back she cannot explain exactly why and today she feels poorly.  Past Medical History:  Diagnosis Date   Allergy    Anemia    Asthma    Breast cancer ([place])    Cancer ([place]) 11/01/2000   left breast cancer   GERD (gastroesophageal reflux disease)    Hyperlipidemia    Hypertension    Personal history of chemotherapy    2002   Personal history of radiation therapy    2002   Polymyalgia [facility])     Past Surgical History:  Procedure Laterality Date   ABDOMINAL HYSTERECTOMY  1975   BREAST SURGERY Left 11/01/2000   masectomy   COLONOSCOPY  2015   CYSTOCELE REPAIR  09/1999   MASTECTOMY Left    2002 with tramflap   RECTOCELE REPAIR  09/1999    Current Medications: No outpatient medications have been marked as taking for the 08/13/21 encounter (Appointment) with Park Liter, MD.     Allergies:   Propranolol, Codeine, and Nitrofuran derivatives    Social History   Socioeconomic History   Marital status: Married    Spouse name: Not on file   Number of children: 2   Years of education: Not on file   Highest education level: Not on file  Occupational History   Occupation: Retired    Fish farm manager: RETIRED  Tobacco Use   Smoking status: Never   Smokeless tobacco: Never  Vaping Use   Vaping Use: Never used  Substance and Sexual Activity   Alcohol use: No    Alcohol/week: 0.0 standard drinks   Drug use: No   Sexual activity: Not on file  Other Topics Concern   Not on file  Social History Narrative   Not on file   Social Determinants of Health   Financial Resource Strain: Low Risk    Difficulty of Paying Living Expenses: Not hard at all  Food Insecurity: No Food Insecurity   Worried About Charity fundraiser in the Last Year: Never true   [place] in the Last Year: Never true  Transportation Needs: No Transportation Needs   Lack of Transportation (Medical): No   Lack of Transportation (Non-Medical): No  Physical Activity: Inactive   Days of Exercise per Week: 0 days   Minutes of Exercise per Session: 0 min  Stress: Stress Concern Present   Feeling of Stress : To some extent  Social Connections:  Moderately Integrated   Frequency of Communication with Friends and Family: Twice a week   Frequency of Social Gatherings with Friends and Family: Twice a week   Attends Religious Services: More than 4 times per year   Active Member of [employer] or Organizations: No   Attends Music therapist: Never   Marital Status: Married     Family History: The patient's family history is negative for Colon cancer, Colon polyps, Diabetes, Kidney disease, and Esophageal cancer. ROS:   Please see the history of present illness.    All 14 point review of systems negative except as described per history of present illness  EKGs/Labs/Other Studies Reviewed:      Recent Labs: 06/10/2021: ALT 24; B Natriuretic Peptide  66.6; Hemoglobin 12.9; Platelets 277; TSH 2.082 08/08/2021: NT-Pro BNP 1,911 08/12/2021: BUN 30; Creatinine, Ser 1.03; Potassium 4.0; Sodium 140  Recent Lipid Panel No results found for: CHOL, TRIG, HDL, CHOLHDL, VLDL, LDLCALC, LDLDIRECT  Physical Exam:    VS:  There were no vitals taken for this visit.    Wt Readings from Last 3 Encounters:  08/08/21 125 lb 3.2 oz (56.8 kg)  08/04/21 121 lb 6.4 oz (55.1 kg)  06/10/21 118 lb (53.5 kg)     GEN:  Well nourished, well developed in no acute distress HEENT: Normal NECK: No JVD; No carotid bruits LYMPHATICS: No lymphadenopathy CARDIAC: RRR, no murmurs, no rubs, no gallops RESPIRATORY:  Clear to auscultation without rales, wheezing or rhonchi  ABDOMEN: Soft, non-tender, non-distended MUSCULOSKELETAL:  No edema; No deformity  SKIN: Warm and dry LOWER EXTREMITIES: no swelling NEUROLOGIC:  Alert and oriented x 3 PSYCHIATRIC:  Normal affect   ASSESSMENT:    1. Ischemic cardiomyopathy   2. Essential hypertension   3. Mixed hyperlipidemia   4. Personal history of chemotherapy    PLAN:    In order of problems listed above:  Ischemic cardiomyopathy trying to put on the right medication now concentrating on diuresis.  I will continue present management I told her that if her weight drops more than 7 pounds attention to reduce dose of diuretic to only 40 mg daily. Essential hypertension: Blood pressure well controlled continue present management. Mixed dyslipidemia: I did review K PN which show me LDL of 83 HDL 48 we will continue present management and plan will continue discussion about increasing dose of pravastatin. Her oxygen saturation is 94 she is asked me question if she can benefit from oxygen therapy.  We will make her walk on the hallway see what the oxygen behave like then will decide about it.   Medication Adjustments/Labs and Tests Ordered: Current medicines are reviewed at length with the patient today.  Concerns  regarding medicines are outlined above.  No orders of the defined types were placed in this encounter.  Medication changes: No orders of the defined types were placed in this encounter.   Signed, Park Liter, MD, [facility] 08/13/2021 3:10 PM    [facility]

## 2021-08-13 NOTE — Patient Instructions (Signed)
Medication Instructions:  Your physician has recommended you make the following change in your medication:  IF YOU LOSE MORE THAN 7 lbs DECREASE Lasix to 40 mg daily   *If you need a refill on your cardiac medications before your next appointment, please call your pharmacy*   Lab Work: None If you have labs (blood work) drawn today and your tests are completely normal, you will receive your results only by: [place] (if you have MyChart) OR A paper copy in the mail If you have any lab test that is abnormal or we need to change your treatment, we will call you to review the results.   Testing/Procedures: None   Follow-Up: At [facility], you and your health needs are our priority.  As part of our continuing mission to provide you with exceptional heart care, we have created designated Provider Care Teams.  These Care Teams include your primary Cardiologist (physician) and Advanced Practice Providers (APPs -  Physician Assistants and Nurse Practitioners) who all work together to provide you with the care you need, when you need it.  We recommend signing up for the patient portal called "MyChart".  Sign up information is provided on this After Visit Summary.  MyChart is used to connect with patients for Virtual Visits (Telemedicine).  Patients are able to view lab/test results, encounter notes, upcoming appointments, etc.  Non-urgent messages can be sent to your provider as well.   To learn more about what you can do with MyChart, go to [email or web address].    Your next appointment:   1 month(s)  The format for your next appointment:   In Person  Provider:   Jenne Campus, MD    Other Instructions

## 2021-08-18 ENCOUNTER — Telehealth: Payer: Self-pay | Admitting: Pulmonary Disease

## 2021-08-18 DIAGNOSIS — M899 Disorder of bone, unspecified: Secondary | ICD-10-CM | POA: Diagnosis not present

## 2021-08-18 NOTE — Telephone Encounter (Signed)
Spoke with Jodi Valdez's husband Jodi Valdez (per [facility]). He states Jodi Valdez has had a hard time getting a deep breaths for 5 - 6 weeks. Jodi Valdez states Jodi Valdez has seen here PCP and cards and still has not gotten any help with her shallow breathing. Jodi Valdez is not currently using Breztri as Jodi Valdez that doesn't help her at all. Jodi Valdez denies Jodi Valdez has fever/chills/GI upset. Jodi Valdez was scheduled for OV with Dr. Loanne Drilling on 08/20/21 but wanted to be seen sooner. Jodi Valdez was scheduled on Tuesday 08/19/21 at 12 with Geraldo Pitter. Jodi Valdez instructed that if Jodi Valdez's breathing became worse he should take Jodi Valdez for emergency medical evaluation. Jodi Valdez and Jodi Valdez stated understanding. Nothing further needed at this time.   Routing to [employer]  as Juluis Rainier

## 2021-08-19 ENCOUNTER — Ambulatory Visit: Payer: Medicare PPO | Admitting: Primary Care

## 2021-08-20 ENCOUNTER — Ambulatory Visit: Payer: Medicare PPO | Admitting: Pulmonary Disease

## 2021-08-20 ENCOUNTER — Encounter: Payer: Self-pay | Admitting: Pulmonary Disease

## 2021-08-20 ENCOUNTER — Other Ambulatory Visit: Payer: Self-pay

## 2021-08-20 VITALS — BP 126/66 | HR 102 | Temp 97.6°F | Ht 64.0 in | Wt 116.2 lb

## 2021-08-20 DIAGNOSIS — R0609 Other forms of dyspnea: Secondary | ICD-10-CM

## 2021-08-20 DIAGNOSIS — U099 Post covid-19 condition, unspecified: Secondary | ICD-10-CM | POA: Diagnosis not present

## 2021-08-20 MED ORDER — ALBUTEROL SULFATE HFA 108 (90 BASE) MCG/ACT IN AERS: 2.0000 | INHALATION_SPRAY | Freq: Four times a day (QID) | RESPIRATORY_TRACT | 2 refills | Status: DC | PRN

## 2021-08-20 NOTE — Patient Instructions (Addendum)
COVID-19 Long Hauler --REFER to Pulmonary Rehab --ARRANGE for pulmonary function test --START Albuterol as needed for shortness of breath or wheezing  Follow-up with me in Jan/Feb with PFTs prior to visit

## 2021-08-20 NOTE — Progress Notes (Signed)
Subjective:   PATIENT ID: Jodi Valdez GENDER: female DOB: 1936-03-19, MRN: [id number]   HPI  Chief Complaint  Patient presents with   Follow-up    Loss of appetite for a week.  Some days sob is worse and some days is better.    Reason for Visit: Follow-up  Jodi Valdez is a 85 year old year old never smoker with allergic rhinitis, LPR, hx breast cancer s/p chemoradiation and GERD who presents for follow-up  Synopsis: Began having shortness of breath one year. Not associated with cough or wheezing. Around this time, she was admitted for pneumonia. Did not need oxygen on discharge.  She will have symptoms at least once a week that limit her from doing housework, caring for her plants and three little dogs. At baseline she reports being active. On days she is more symptomatic she feels she cannot do anything and often wakes up feeling like she is having a bad day and has difficulty taking breath. Denies chest pain or tightness. No clear correlation with extremes in weather or humidity, pet exposure. Has tried albuterol for 2-3 weeks daily but did not have any relief. She reports her COVID-19 antibody tested positive and states she lost her sense of taste around May 2022.  08/20/21 She continues shortness of breath with exertion. She has tried Librarian, academic without any relief. Exertion will worsen it. She previously was active in the yard and now feels like she is sitting most of the time. She is able to walk short distances. She previously had edema in her legs but that has improved with lasix, managed by her cardiologist.  Social History: Never smoker No child respiratory infections or diagnosis  Past Medical History:  Diagnosis Date   Allergy    Anemia    Asthma    Breast cancer ([place])    Cancer ([place]) 11/01/2000   left breast cancer   GERD (gastroesophageal reflux disease)    Hyperlipidemia    Hypertension    Personal history of chemotherapy    2002    Personal history of radiation therapy    2002   Polymyalgia ([place])      Family History  Problem Relation Age of Onset   Colon cancer Neg Hx    Colon polyps Neg Hx    Diabetes Neg Hx    Kidney disease Neg Hx    Esophageal cancer Neg Hx      Social History   Occupational History   Occupation: Retired    Fish farm manager: RETIRED  Tobacco Use   Smoking status: Never   Smokeless tobacco: Never  Vaping Use   Vaping Use: Never used  Substance and Sexual Activity   Alcohol use: No    Alcohol/week: 0.0 standard drinks   Drug use: No   Sexual activity: Not on file    Allergies  Allergen Reactions   Propranolol Other (See Comments)    Other reaction(s): Other (See Comments) hallucinations hallucinations Other reaction(s): Other (See Comments) hallucinations hallucinations Other reaction(s): Other (See Comments) hallucinations  Other reaction(s): Other (See Comments) hallucinations hallucinations Other reaction(s): Other (See Comments) hallucinations   Codeine Nausea And Vomiting   Nitrofuran Derivatives Nausea And Vomiting     Outpatient Medications Prior to Visit  Medication Sig Dispense Refill   alendronate (FOSAMAX) 70 MG tablet Take 70 mg by mouth once a week.     Ascorbic Acid (VITAMIN C PO) Take 1,000 mg by mouth daily.     azelastine (ASTELIN) 0.1 %  nasal spray Place 1 spray into both nostrils as needed.     carvedilol (COREG) 3.125 MG tablet Take 6.25 mg by mouth 2 (two) times daily with a meal.     Cholecalciferol (VITAMIN D3) 1000 UNITS CAPS Take 1,000 Units by mouth 2 (two) times daily.     EPINEPHrine 0.3 mg/0.3 mL IJ SOAJ injection Use as directed for life-threatening allergic reaction. (Patient taking differently: Inject 0.3 mg into the muscle as needed for anaphylaxis. Use as directed for life-threatening allergic reaction.) 2 Device 3   famotidine (PEPCID) 20 MG tablet Take 20 mg by mouth 2 (two) times daily.     furosemide (LASIX) 40 MG tablet Take 1.5  tablets (60 mg total) by mouth daily. 45 tablet 1   levothyroxine (SYNTHROID) 125 MCG tablet Take 88 mcg by mouth in the morning.     losartan (COZAAR) 50 MG tablet Take 50 mg by mouth daily.     metFORMIN (GLUCOPHAGE) 1000 MG tablet Take 1,000 mg by mouth 2 (two) times daily with a meal.     Multiple Vitamins-Minerals (PRESERVISION/LUTEIN PO) Take 1 tablet by mouth 2 (two) times daily. Unknown strength     potassium chloride SA (KLOR-CON) 20 MEQ tablet Take 1 tablet (20 mEq total) by mouth daily. 30 tablet 1   pravastatin (PRAVACHOL) 20 MG tablet Take 20 mg by mouth at bedtime.     predniSONE (DELTASONE) 1 MG tablet Take 1 mg by mouth See admin instructions. Taking ( three 1mg  tablets and one 5mg  tablet) 8 mg Monday, Wed, Friday, then 7mg  other days     traZODone (DESYREL) 50 MG tablet Take 50 mg by mouth at bedtime as needed for sleep.     LORazepam (ATIVAN) 1 MG tablet Take by mouth as needed for anxiety. (Patient not taking: Reported on 08/20/2021)     Spacer/Aero-Holding Dorise Bullion As directed (Patient taking differently: 1 each by Other route See admin instructions. As directed/ SOB) 1 each 0   predniSONE (DELTASONE) 5 MG tablet Take 5 mg by mouth daily with breakfast. (Patient not taking: Reported on 08/20/2021)     No facility-administered medications prior to visit.    Review of Systems  Constitutional:  Negative for chills, diaphoresis, fever, malaise/fatigue and weight loss.  HENT:  Negative for congestion.   Respiratory:  Positive for shortness of breath. Negative for cough, hemoptysis, sputum production and wheezing.   Cardiovascular:  Negative for chest pain, palpitations and leg swelling.    Objective:   Vitals:   08/20/21 1538  BP: 126/66  Pulse: (!) 102  Temp: 97.6 F (36.4 C)  TempSrc: Oral  SpO2: 96%  Weight: 116 lb 3.2 oz (52.7 kg)  Height: 5\' 4"  (1.626 m)   SpO2: 96 % O2 Device: None (Room air)  Physical Exam: General: Well-appearing, no acute  distress HENT: [place], AT Eyes: EOMI, no scleral icterus Respiratory: Clear to auscultation bilaterally.  No crackles, wheezing or rales Cardiovascular: RRR, -M/R/G, no JVD Extremities:-Edema,-tenderness Neuro: AAO x4, CNII-XII grossly intact Psych: Normal mood, normal affect  Data Reviewed:  Imaging: CT chest/abdomen/pelvis 12/10/2017 (report only)- Mild bilateral dependent atelectasis.  Mild chronic subpleural scarring lingula.  Isolated paraseptal emphysema in the medial upper lobe. CT 07/18/21 - Chronic appearing subpleural bibasilar scarring. Associated with ground glass  PFT: 09/09/2020 FVC 2.46 (103%) FEV1 2.24 (126%) Ratio 91  Interpretation: Normal spirometry  Labs: CBC    Component Value Date/Time   WBC 15.5 (H) 06/10/2021 1333   RBC 4.10 06/10/2021 1333  HGB 12.9 06/10/2021 1333   HCT 40.1 06/10/2021 1333   PLT 277 06/10/2021 1333   MCV 97.8 06/10/2021 1333   MCH 31.5 06/10/2021 1333   MCHC 32.2 06/10/2021 1333   RDW 13.7 06/10/2021 1333   LYMPHSABS 1.9 06/10/2021 1333   MONOABS 0.6 06/10/2021 1333   EOSABS 0.0 06/10/2021 1333   BASOSABS 0.0 06/10/2021 1333   Absolute eosinophils 06/04/2020-200     Assessment & Plan:   Discussion: 85 year old female with chronic systolic heart failure (ZO10-96% 06/2021), allergic rhinitis, LPD, hx breast cancer s/p chemoradiation and GERD who presents for follow-up COVID 19 long hauler. Diagnosed with COVID-19 in 01/2021. Continues to have dyspnea on exertion despite adequate diuresis for her heart failure/lower extremity edema.  COVID-19 Long Hauler --REFER to Pulmonary Rehab --ARRANGE for pulmonary function test --START Albuterol as needed for shortness of breath or wheezing  Follow-up with me in Jan/Feb with PFTs prior to visit   Health Maintenance Immunization History  Administered Date(s) Administered   Fluad Quad(high Dose 65+) 06/20/2021   Influenza-Unspecified 05/22/2013   PFIZER Comirnaty(Gray Top)Covid-19  Tri-Sucrose Vaccine 08/09/2020   PFIZER(Purple Top)SARS-COV-2 Vaccination 11/03/2019, 11/28/2019   Pfizer Covid-19 Vaccine Bivalent Booster 5y-11y 06/03/2021   CT Lung Screen-not qualified never smoker  Orders Placed This Encounter  Procedures   AMB referral to pulmonary rehabilitation    Referral Priority:   Routine    Referral Type:   Consultation    Number of Visits Requested:   1   Pulmonary function test    Standing Status:   Future    Standing Expiration Date:   08/20/2022    Scheduling Instructions:     Same day OV    Order Specific Question:   Where should this test be performed?    Answer:   [facility]    Order Specific Question:   Full PFT: includes the following: basic spirometry, spirometry pre & post bronchodilator, diffusion capacity (DLCO), lung volumes    Answer:   Full PFT   Meds ordered this encounter  Medications   albuterol (VENTOLIN HFA) 108 (90 Base) MCG/ACT inhaler    Sig: Inhale 2 puffs into the lungs every 6 (six) hours as needed for wheezing or shortness of breath.    Dispense:  8 g    Refill:  2    Return in about 2 months (around 10/27/2021).    I have spent a total time of 32-minutes on the day of the appointment reviewing prior documentation, coordinating care and discussing medical diagnosis and plan with the patient/family. Past medical history, allergies, medications were reviewed. Pertinent imaging, labs and tests included in this note have been reviewed and interpreted independently by me.  [place], MD [facility] 08/20/2021 3:44 PM  Office Number [phone]

## 2021-08-24 ENCOUNTER — Encounter: Payer: Self-pay | Admitting: Pulmonary Disease

## 2021-08-25 DIAGNOSIS — E039 Hypothyroidism, unspecified: Secondary | ICD-10-CM | POA: Diagnosis not present

## 2021-08-25 DIAGNOSIS — K5900 Constipation, unspecified: Secondary | ICD-10-CM | POA: Diagnosis not present

## 2021-08-25 DIAGNOSIS — E1169 Type 2 diabetes mellitus with other specified complication: Secondary | ICD-10-CM | POA: Diagnosis not present

## 2021-08-25 DIAGNOSIS — E7800 Pure hypercholesterolemia, unspecified: Secondary | ICD-10-CM | POA: Diagnosis not present

## 2021-08-25 DIAGNOSIS — R42 Dizziness and giddiness: Secondary | ICD-10-CM | POA: Diagnosis not present

## 2021-08-25 DIAGNOSIS — Z6821 Body mass index (BMI) 21.0-21.9, adult: Secondary | ICD-10-CM | POA: Diagnosis not present

## 2021-08-25 DIAGNOSIS — R5383 Other fatigue: Secondary | ICD-10-CM | POA: Diagnosis not present

## 2021-08-25 DIAGNOSIS — F330 Major depressive disorder, recurrent, mild: Secondary | ICD-10-CM | POA: Diagnosis not present

## 2021-08-28 DIAGNOSIS — N6311 Unspecified lump in the right breast, upper outer quadrant: Secondary | ICD-10-CM | POA: Diagnosis not present

## 2021-08-28 DIAGNOSIS — R928 Other abnormal and inconclusive findings on diagnostic imaging of breast: Secondary | ICD-10-CM | POA: Diagnosis not present

## 2021-08-28 DIAGNOSIS — N644 Mastodynia: Secondary | ICD-10-CM | POA: Diagnosis not present

## 2021-09-08 DIAGNOSIS — K648 Other hemorrhoids: Secondary | ICD-10-CM | POA: Diagnosis not present

## 2021-09-12 ENCOUNTER — Ambulatory Visit: Payer: Medicare PPO | Admitting: Cardiology

## 2021-09-17 ENCOUNTER — Other Ambulatory Visit: Payer: Medicare PPO

## 2021-09-25 DIAGNOSIS — R41840 Attention and concentration deficit: Secondary | ICD-10-CM | POA: Diagnosis not present

## 2021-09-25 DIAGNOSIS — E1169 Type 2 diabetes mellitus with other specified complication: Secondary | ICD-10-CM | POA: Diagnosis not present

## 2021-09-25 DIAGNOSIS — U099 Post covid-19 condition, unspecified: Secondary | ICD-10-CM | POA: Diagnosis not present

## 2021-09-25 DIAGNOSIS — M353 Polymyalgia rheumatica: Secondary | ICD-10-CM | POA: Diagnosis not present

## 2021-09-25 DIAGNOSIS — I5020 Unspecified systolic (congestive) heart failure: Secondary | ICD-10-CM | POA: Diagnosis not present

## 2021-09-25 DIAGNOSIS — E7800 Pure hypercholesterolemia, unspecified: Secondary | ICD-10-CM | POA: Diagnosis not present

## 2021-09-25 DIAGNOSIS — Z6822 Body mass index (BMI) 22.0-22.9, adult: Secondary | ICD-10-CM | POA: Diagnosis not present

## 2021-09-25 DIAGNOSIS — D72829 Elevated white blood cell count, unspecified: Secondary | ICD-10-CM | POA: Diagnosis not present

## 2021-09-25 DIAGNOSIS — F330 Major depressive disorder, recurrent, mild: Secondary | ICD-10-CM | POA: Diagnosis not present

## 2021-09-30 ENCOUNTER — Ambulatory Visit: Payer: Medicare PPO | Admitting: Cardiology

## 2021-09-30 ENCOUNTER — Other Ambulatory Visit: Payer: Self-pay

## 2021-09-30 ENCOUNTER — Encounter: Payer: Self-pay | Admitting: Cardiology

## 2021-09-30 ENCOUNTER — Other Ambulatory Visit: Payer: Self-pay | Admitting: *Deleted

## 2021-09-30 VITALS — BP 110/60 | HR 86 | Ht 64.0 in | Wt 125.0 lb

## 2021-09-30 DIAGNOSIS — I10 Essential (primary) hypertension: Secondary | ICD-10-CM

## 2021-09-30 DIAGNOSIS — R0609 Other forms of dyspnea: Secondary | ICD-10-CM

## 2021-09-30 DIAGNOSIS — E782 Mixed hyperlipidemia: Secondary | ICD-10-CM | POA: Diagnosis not present

## 2021-09-30 DIAGNOSIS — I255 Ischemic cardiomyopathy: Secondary | ICD-10-CM | POA: Diagnosis not present

## 2021-09-30 DIAGNOSIS — U099 Post covid-19 condition, unspecified: Secondary | ICD-10-CM | POA: Diagnosis not present

## 2021-09-30 NOTE — Patient Instructions (Addendum)
Medication Instructions:  Your physician recommends that you continue on your current medications as directed. Please refer to the Current Medication list given to you today.  *If you need a refill on your cardiac medications before your next appointment, please call your pharmacy*   Lab Work: TODAY:  CMP & PRO BNP If you have labs (blood work) drawn today and your tests are completely normal, you will receive your results only by: [place] (if you have MyChart) OR A paper copy in the mail If you have any lab test that is abnormal or we need to change your treatment, we will call you to review the results.   Testing/Procedures: None ordered   Follow-Up: At [facility], you and your health needs are our priority.  As part of our continuing mission to provide you with exceptional heart care, we have created designated Provider Care Teams.  These Care Teams include your primary Cardiologist (physician) and Advanced Practice Providers (APPs -  Physician Assistants and Nurse Practitioners) who all work together to provide you with the care you need, when you need it.  We recommend signing up for the patient portal called "MyChart".  Sign up information is provided on this After Visit Summary.  MyChart is used to connect with patients for Virtual Visits (Telemedicine).  Patients are able to view lab/test results, encounter notes, upcoming appointments, etc.  Non-urgent messages can be sent to your provider as well.   To learn more about what you can do with MyChart, go to [email or web address].    Your next appointment:   3 month(s)  The format for your next appointment:   In Person  Provider:   Jenne Campus, MD    Other Instructions

## 2021-09-30 NOTE — Addendum Note (Signed)
Addended by: Gaetano Net on: 09/30/2021 01:15 PM   Modules accepted: Orders

## 2021-09-30 NOTE — Progress Notes (Signed)
Cardiology Office Note:    Date:  09/30/2021   ID:  Jodi, Valdez 03/14/1936, MRN [id number]  PCP:  Leonides Sake, MD  Cardiologist:  Jenne Campus, MD    Referring MD: Leonides Sake, MD   Chief Complaint  Patient presents with   Shortness of Breath     Decrease appetite    History of Present Illness:    Jodi Valdez is a 87 y.o. female   with past medical history significant for essential hypertension, breast cancer, cardiomyopathy with ejection fraction 50 to 55% but recently admitted to [facility] because of shortness of breath and swollen legs he was find to have ejection fraction 40 to 45%.  She did have a stress test done which showed no evidence of ischemia but potentially old myocardial infarction involving the basal portion of the inferior wall.  While she was in the hospital she was evaluated for pulmonary emboli with CT which was negative. She comes today 2 months for follow-up.  Overall she is not doing well she complains being profoundly weak and tired not much shortness of breath just fatigue tiredness lack of appetite she said that food does not taste at all.  Interestingly she gained few pounds and is worried about decompensation congestive heart for but on the physical exam I do not see evidence of this  Past Medical History:  Diagnosis Date   Allergy    Anemia    Asthma    Breast cancer ([place])    Cancer ([place]) 11/01/2000   left breast cancer   GERD (gastroesophageal reflux disease)    Hyperlipidemia    Hypertension    Personal history of chemotherapy    2002   Personal history of radiation therapy    2002   Polymyalgia [facility])     Past Surgical History:  Procedure Laterality Date   ABDOMINAL HYSTERECTOMY  1975   BREAST SURGERY Left 11/01/2000   masectomy   COLONOSCOPY  2015   CYSTOCELE REPAIR  09/1999   MASTECTOMY Left    2002 with tramflap   RECTOCELE REPAIR  09/1999    Current Medications: Current Meds   Medication Sig   albuterol (VENTOLIN HFA) 108 (90 Base) MCG/ACT inhaler Inhale 2 puffs into the lungs every 6 (six) hours as needed for wheezing or shortness of breath.   alendronate (FOSAMAX) 70 MG tablet Take 70 mg by mouth once a week.   Ascorbic Acid (VITAMIN C PO) Take 1,000 mg by mouth daily.   azelastine (ASTELIN) 0.1 % nasal spray Place 1 spray into both nostrils as needed for allergies.   carvedilol (COREG) 6.25 MG tablet Take 6.25 mg by mouth 2 (two) times daily with a meal.   Cholecalciferol (VITAMIN D3) 1000 UNITS CAPS Take 1,000 Units by mouth 2 (two) times daily.   EPINEPHrine 0.3 mg/0.3 mL IJ SOAJ injection Use as directed for life-threatening allergic reaction.   famotidine (PEPCID) 20 MG tablet Take 20 mg by mouth 2 (two) times daily.   furosemide (LASIX) 40 MG tablet Take 40 mg by mouth daily.   levothyroxine (SYNTHROID) 125 MCG tablet Take 88 mcg by mouth in the morning.   losartan (COZAAR) 50 MG tablet Take 50 mg by mouth daily.   metFORMIN (GLUCOPHAGE) 500 MG tablet Take 500 mg by mouth daily with breakfast.   Multiple Vitamins-Minerals (PRESERVISION/LUTEIN PO) Take 1 tablet by mouth 2 (two) times daily. Unknown strength   potassium chloride SA (KLOR-CON) 20 MEQ tablet Take 1 tablet (  20 mEq total) by mouth daily.   pravastatin (PRAVACHOL) 20 MG tablet Take 20 mg by mouth at bedtime.   predniSONE (DELTASONE) 1 MG tablet Take 8 mg by mouth daily.   Spacer/Aero-Holding Dorise Bullion As directed   traZODone (DESYREL) 50 MG tablet Take 50 mg by mouth at bedtime as needed for sleep.   [DISCONTINUED] metFORMIN (GLUCOPHAGE) 1000 MG tablet Take 1,000 mg by mouth 2 (two) times daily with a meal.     Allergies:   Propranolol, Codeine, and Nitrofuran derivatives   Social History   Socioeconomic History   Marital status: Married    Spouse name: Not on file   Number of children: 2   Years of education: Not on file   Highest education level: Not on file  Occupational History    Occupation: Retired    Fish farm manager: RETIRED  Tobacco Use   Smoking status: Never   Smokeless tobacco: Never  Vaping Use   Vaping Use: Never used  Substance and Sexual Activity   Alcohol use: No    Alcohol/week: 0.0 standard drinks   Drug use: No   Sexual activity: Not on file  Other Topics Concern   Not on file  Social History Narrative   Not on file   Social Determinants of Health   Financial Resource Strain: Low Risk    Difficulty of Paying Living Expenses: Not hard at all  Food Insecurity: No Food Insecurity   Worried About Charity fundraiser in the Last Year: Never true   [place] in the Last Year: Never true  Transportation Needs: No Transportation Needs   Lack of Transportation (Medical): No   Lack of Transportation (Non-Medical): No  Physical Activity: Inactive   Days of Exercise per Week: 0 days   Minutes of Exercise per Session: 0 min  Stress: Stress Concern Present   Feeling of Stress : To some extent  Social Connections: Moderately Integrated   Frequency of Communication with Friends and Family: Twice a week   Frequency of Social Gatherings with Friends and Family: Twice a week   Attends Religious Services: More than 4 times per year   Active Member of [employer] or Organizations: No   Attends Music therapist: Never   Marital Status: Married     Family History: The patient's family history is negative for Colon cancer, Colon polyps, Diabetes, Kidney disease, and Esophageal cancer. ROS:   Please see the history of present illness.    All 14 point review of systems negative except as described per history of present illness  EKGs/Labs/Other Studies Reviewed:      Recent Labs: 06/10/2021: ALT 24; B Natriuretic Peptide 66.6; Hemoglobin 12.9; Platelets 277; TSH 2.082 08/08/2021: NT-Pro BNP 1,911 08/12/2021: BUN 30; Creatinine, Ser 1.03; Potassium 4.0; Sodium 140  Recent Lipid Panel No results found for: CHOL, TRIG, HDL, CHOLHDL, VLDL,  LDLCALC, LDLDIRECT  Physical Exam:    VS:  BP 110/60 (BP Location: Right Arm, Patient Position: Sitting)    Pulse 86    Ht 5\' 4"  (1.626 m)    Wt 125 lb (56.7 kg)    SpO2 96%    BMI 21.46 kg/m     Wt Readings from Last 3 Encounters:  09/30/21 125 lb (56.7 kg)  08/20/21 116 lb 3.2 oz (52.7 kg)  08/13/21 117 lb 9.6 oz (53.3 kg)     GEN:  Well nourished, well developed in no acute distress HEENT: Normal NECK: No JVD; No carotid bruits LYMPHATICS:  No lymphadenopathy CARDIAC: RRR, no murmurs, no rubs, no gallops RESPIRATORY:  Clear to auscultation without rales, wheezing or rhonchi  ABDOMEN: Soft, non-tender, non-distended MUSCULOSKELETAL:  No edema; No deformity  SKIN: Warm and dry LOWER EXTREMITIES: no swelling NEUROLOGIC:  Alert and oriented x 3 PSYCHIATRIC:  Normal affect   ASSESSMENT:    1. Ischemic cardiomyopathy   2. Essential hypertension   3. COVID-19 long hauler manifesting chronic dyspnea   4. Mixed hyperlipidemia    PLAN:    In order of problems listed above:  Ischemic cardiomyopathy.  Trying to put her on appropriate medication today we will do Chem-7 as well as proBNP to find out if she is decompensated on the physical exam I do not see synovitis.  I will check complete metabolic panel make sure the liver function test is still normal.  She complaining of having lack of appetite and nausea Essential hypertension blood pressure well controlled continue present management. Long COVID.  Possible explanation of her symptomatology but I will make sure is not cardiac related Mixed dyslipidemia I did review K PN which show me LDL of 87 HDL 39 this is from December 5.  I will not initiate any medication right now since she is not having appetite and I am worried about potential liver issues.   Medication Adjustments/Labs and Tests Ordered: Current medicines are reviewed at length with the patient today.  Concerns regarding medicines are outlined above.  No orders of the  defined types were placed in this encounter.  Medication changes: No orders of the defined types were placed in this encounter.   Signed, Park Liter, MD, [facility] 09/30/2021 1:10 PM    [place]

## 2021-10-01 LAB — COMPREHENSIVE METABOLIC PANEL
ALT: 19 IU/L (ref 0–32)
AST: 19 IU/L (ref 0–40)
Albumin/Globulin Ratio: 1.9 (ref 1.2–2.2)
Albumin: 3.6 g/dL (ref 3.6–4.6)
Alkaline Phosphatase: 69 IU/L (ref 44–121)
BUN/Creatinine Ratio: 23 (ref 12–28)
BUN: 23 mg/dL (ref 8–27)
Bilirubin Total: 0.2 mg/dL (ref 0.0–1.2)
CO2: 23 mmol/L (ref 20–29)
Calcium: 8.9 mg/dL (ref 8.7–10.3)
Chloride: 103 mmol/L (ref 96–106)
Creatinine, Ser: 0.98 mg/dL (ref 0.57–1.00)
Globulin, Total: 1.9 g/dL (ref 1.5–4.5)
Glucose: 212 mg/dL — ABNORMAL HIGH (ref 70–99)
Potassium: 4.5 mmol/L (ref 3.5–5.2)
Sodium: 139 mmol/L (ref 134–144)
Total Protein: 5.5 g/dL — ABNORMAL LOW (ref 6.0–8.5)
eGFR: 57 mL/min/{1.73_m2} — ABNORMAL LOW (ref >59)

## 2021-10-01 LAB — PRO B NATRIURETIC PEPTIDE: NT-Pro BNP: 574 pg/mL (ref 0–738)

## 2021-10-02 ENCOUNTER — Ambulatory Visit: Payer: Medicare PPO | Admitting: Cardiology

## 2021-10-03 ENCOUNTER — Telehealth: Payer: Self-pay | Admitting: Cardiology

## 2021-10-03 NOTE — Telephone Encounter (Signed)
Spoke with patient regarding results and recommendation.  Patient verbalizes understanding and is agreeable to plan of care. Advised patient to call back with any issues or concerns.  

## 2021-10-03 NOTE — Telephone Encounter (Signed)
Patient ask that nurse give her call back to go back over her results. Please advise

## 2021-10-07 ENCOUNTER — Ambulatory Visit: Payer: Medicare PPO | Admitting: Oncology

## 2021-10-16 DIAGNOSIS — Z6821 Body mass index (BMI) 21.0-21.9, adult: Secondary | ICD-10-CM | POA: Diagnosis not present

## 2021-10-16 DIAGNOSIS — R0609 Other forms of dyspnea: Secondary | ICD-10-CM | POA: Diagnosis not present

## 2021-10-16 DIAGNOSIS — U099 Post covid-19 condition, unspecified: Secondary | ICD-10-CM | POA: Diagnosis not present

## 2021-10-16 DIAGNOSIS — R630 Anorexia: Secondary | ICD-10-CM | POA: Diagnosis not present

## 2021-10-27 ENCOUNTER — Ambulatory Visit: Payer: Medicare PPO | Admitting: Pulmonary Disease

## 2021-10-27 ENCOUNTER — Encounter: Payer: Self-pay | Admitting: Pulmonary Disease

## 2021-10-27 ENCOUNTER — Ambulatory Visit (INDEPENDENT_AMBULATORY_CARE_PROVIDER_SITE_OTHER): Payer: Medicare PPO | Admitting: Pulmonary Disease

## 2021-10-27 ENCOUNTER — Other Ambulatory Visit: Payer: Self-pay

## 2021-10-27 VITALS — BP 130/70 | HR 78 | Temp 97.6°F | Ht 64.0 in | Wt 123.0 lb

## 2021-10-27 DIAGNOSIS — R0609 Other forms of dyspnea: Secondary | ICD-10-CM | POA: Diagnosis not present

## 2021-10-27 DIAGNOSIS — U099 Post covid-19 condition, unspecified: Secondary | ICD-10-CM

## 2021-10-27 LAB — PULMONARY FUNCTION TEST
DL/VA % pred: 101 %
DL/VA: 4.11 ml/min/mmHg/L
DLCO cor % pred: 71 %
DLCO cor: 13.36 ml/min/mmHg
DLCO unc % pred: 71 %
DLCO unc: 13.36 ml/min/mmHg

## 2021-10-27 MED ORDER — IPRATROPIUM-ALBUTEROL 0.5-2.5 (3) MG/3ML IN SOLN: 3.0000 mL | Freq: Four times a day (QID) | RESPIRATORY_TRACT | 5 refills | Status: DC | PRN

## 2021-10-27 MED ORDER — BUDESONIDE-FORMOTEROL FUMARATE 160-4.5 MCG/ACT IN AERO: 2.0000 | INHALATION_SPRAY | Freq: Two times a day (BID) | RESPIRATORY_TRACT | 5 refills | Status: DC

## 2021-10-27 NOTE — Progress Notes (Signed)
Subjective:   PATIENT ID: Jodi Valdez GENDER: female DOB: August 21, 1936, MRN: [id number]   HPI  Chief Complaint  Patient presents with   Follow-up    Sob, serve fatigue    Reason for Visit: Follow-up  Jodi Valdez is a 86 year old year old never smoker with allergic rhinitis, LPR, hx breast cancer s/p chemoradiation and GERD who presents for follow-up  Synopsis: Began having shortness of breath one year. Not associated with cough or wheezing. Around this time, she was admitted for pneumonia. Did not need oxygen on discharge.  She will have symptoms at least once a week that limit her from doing housework, caring for her plants and three little dogs. At baseline she reports being active. On days she is more symptomatic she feels she cannot do anything and often wakes up feeling like she is having a bad day and has difficulty taking breath. Denies chest pain or tightness. No clear correlation with extremes in weather or humidity, pet exposure. Has tried albuterol for 2-3 weeks daily but did not have any relief. She reports her COVID-19 antibody tested positive and states she lost her sense of taste around May 2022.  08/20/21 She continues shortness of breath with exertion. She has tried Librarian, academic without any relief. Exertion will worsen it. She previously was active in the yard and now feels like she is sitting most of the time. She is able to walk short distances. She previously had edema in her legs but that has improved with lasix, managed by her cardiologist. She had been hospitalized at [facility] in October.  10/27/21 Since our last visit, she reports chronic fatigue, shortness of breath, loss of appetite, poor difficulty concentrating. It has been over a year since her COVID diagnosis. She has tried albuterol three times a day for a month before stopping. Sometimes in evening her breathing may clear up briefly. Denies coughing or wheezing.  Social History: Never  smoker No child respiratory infections or diagnosis  Past Medical History:  Diagnosis Date   Allergy    Anemia    Asthma    Breast cancer ([place])    Cancer ([place]) 11/01/2000   left breast cancer   GERD (gastroesophageal reflux disease)    Hyperlipidemia    Hypertension    Personal history of chemotherapy    2002   Personal history of radiation therapy    2002   Polymyalgia ([place])      Family History  Problem Relation Age of Onset   Colon cancer Neg Hx    Colon polyps Neg Hx    Diabetes Neg Hx    Kidney disease Neg Hx    Esophageal cancer Neg Hx      Social History   Occupational History   Occupation: Retired    Fish farm manager: RETIRED  Tobacco Use   Smoking status: Never   Smokeless tobacco: Never  Vaping Use   Vaping Use: Never used  Substance and Sexual Activity   Alcohol use: No    Alcohol/week: 0.0 standard drinks   Drug use: No   Sexual activity: Not on file    Allergies  Allergen Reactions   Propranolol Other (See Comments)    Other reaction(s): Other (See Comments) hallucinations hallucinations Other reaction(s): Other (See Comments) hallucinations hallucinations Other reaction(s): Other (See Comments) hallucinations  Other reaction(s): Other (See Comments) hallucinations hallucinations Other reaction(s): Other (See Comments) hallucinations   Codeine Nausea And Vomiting   Nitrofuran Derivatives Nausea And Vomiting  Outpatient Medications Prior to Visit  Medication Sig Dispense Refill   alendronate (FOSAMAX) 70 MG tablet Take 70 mg by mouth once a week.     Ascorbic Acid (VITAMIN C PO) Take 1,000 mg by mouth daily.     carvedilol (COREG) 6.25 MG tablet Take 6.25 mg by mouth 2 (two) times daily with a meal.     Cholecalciferol (VITAMIN D3) 1000 UNITS CAPS Take 1,000 Units by mouth 2 (two) times daily.     EPINEPHrine 0.3 mg/0.3 mL IJ SOAJ injection Use as directed for life-threatening allergic reaction. 2 Device 3   famotidine (PEPCID) 20 MG  tablet Take 20 mg by mouth 2 (two) times daily.     furosemide (LASIX) 40 MG tablet Take 20 mg by mouth daily.     levothyroxine (SYNTHROID) 125 MCG tablet Take 88 mcg by mouth in the morning.     losartan (COZAAR) 50 MG tablet Take 50 mg by mouth daily.     metFORMIN (GLUCOPHAGE) 500 MG tablet Take 500 mg by mouth daily with breakfast.     Multiple Vitamins-Minerals (PRESERVISION/LUTEIN PO) Take 1 tablet by mouth 2 (two) times daily. Unknown strength     potassium chloride SA (KLOR-CON) 20 MEQ tablet Take 1 tablet (20 mEq total) by mouth daily. (Patient taking differently: Take 10 mEq by mouth daily.) 30 tablet 1   pravastatin (PRAVACHOL) 20 MG tablet Take 20 mg by mouth at bedtime.     predniSONE (DELTASONE) 1 MG tablet Take 8 mg by mouth daily.     Spacer/Aero-Holding Josiah Lobo DEVI As directed 1 each 0   traZODone (DESYREL) 50 MG tablet Take 50 mg by mouth at bedtime as needed for sleep.     albuterol (VENTOLIN HFA) 108 (90 Base) MCG/ACT inhaler Inhale 2 puffs into the lungs every 6 (six) hours as needed for wheezing or shortness of breath. (Patient not taking: Reported on 10/27/2021) 8 g 2   azelastine (ASTELIN) 0.1 % nasal spray Place 1 spray into both nostrils as needed for allergies. (Patient not taking: Reported on 10/27/2021)     No facility-administered medications prior to visit.    Review of Systems  Constitutional:  Negative for chills, diaphoresis, fever, malaise/fatigue and weight loss.  HENT:  Negative for congestion.   Respiratory:  Positive for shortness of breath. Negative for cough, hemoptysis, sputum production and wheezing.   Cardiovascular:  Negative for chest pain, palpitations and leg swelling.    Objective:   Vitals:   10/27/21 1613  BP: 130/70  Pulse: 78  Temp: 97.6 F (36.4 C)  TempSrc: Oral  SpO2: 97%  Weight: 123 lb (55.8 kg)  Height: 5\' 4"  (1.626 m)   SpO2: 97 % O2 Device: None (Room air)  Physical Exam: General: Chronically ill-appearing, no acute  distress HENT: [place], AT Eyes: EOMI, no scleral icterus Respiratory: Clear to auscultation bilaterally.  No crackles, wheezing or rales Cardiovascular: RRR, -M/R/G, no JVD Extremities:-Edema,-tenderness Neuro: AAO x4, CNII-XII grossly intact Psych: Depressed mood, normal affect  Data Reviewed:  Imaging: CT chest/abdomen/pelvis 12/10/2017 (report only)- Mild bilateral dependent atelectasis.  Mild chronic subpleural scarring lingula.  Isolated paraseptal emphysema in the medial upper lobe. CT 07/18/21 - Chronic appearing subpleural bibasilar scarring. Associated with ground glass  PFT: 09/09/2020 FVC 2.46 (103%) FEV1 2.24 (126%) Ratio 91  Interpretation: Normal spirometry  Labs: CBC    Component Value Date/Time   WBC 15.5 (H) 06/10/2021 1333   RBC 4.10 06/10/2021 1333   HGB 12.9 06/10/2021 1333  HCT 40.1 06/10/2021 1333   PLT 277 06/10/2021 1333   MCV 97.8 06/10/2021 1333   MCH 31.5 06/10/2021 1333   MCHC 32.2 06/10/2021 1333   RDW 13.7 06/10/2021 1333   LYMPHSABS 1.9 06/10/2021 1333   MONOABS 0.6 06/10/2021 1333   EOSABS 0.0 06/10/2021 1333   BASOSABS 0.0 06/10/2021 1333   Absolute eosinophils 06/04/2020-200     Assessment & Plan:   Discussion: 86 year old female never smoker with COVID long hauler (since May 7903), chronic systolic heart failure, hx breast cancer s/p chemoradiation and GERD who presents for follow-up. Continues to have symptoms that are uncontrolled. No improvement with Breztri. Has difficulty with inhalers. We discussed alternative methods including nebulizer treatments as an add-on. We reviewed clinical course of COVID-19 including long-term complications including post-inflammatory lung disease and long hauler symptoms.  [place] --[place] for nebulizer  --START Duonebs TWO times a day or AS NEEDED --START Symbicort 160-4.5 mcg TWO puffs TWICE a day --Previously to Pulmonary Rehab. Declined due to severe fatigue --Unable to complete  pulmonary function test --Hold off CT Chest as not likely to change medical plan   Health Maintenance Immunization History  Administered Date(s) Administered   Fluad Quad(high Dose 65+) 06/20/2021   Influenza-Unspecified 05/22/2013   PFIZER Comirnaty(Gray Top)Covid-19 Tri-Sucrose Vaccine 08/09/2020   PFIZER(Purple Top)SARS-COV-2 Vaccination 11/03/2019, 11/28/2019   Pfizer Covid-19 Vaccine Bivalent Booster 5y-11y 06/03/2021   CT Lung Screen-not qualified never smoker  Orders Placed This Encounter  Procedures   Ambulatory Referral for DME    Referral Priority:   Routine    Referral Type:   Durable Medical Equipment Purchase    Number of Visits Requested:   1   Meds ordered this encounter  Medications   ipratropium-albuterol (DUONEB) 0.5-2.5 (3) MG/3ML SOLN    Sig: Take 3 mLs by nebulization every 6 (six) hours as needed.    Dispense:  360 mL    Refill:  5   budesonide-formoterol (SYMBICORT) 160-4.5 MCG/ACT inhaler    Sig: Inhale 2 puffs into the lungs in the morning and at bedtime.    Dispense:  1 each    Refill:  5    Return in about 1 month (around 11/24/2021).    I have spent a total time of 32-minutes on the day of the appointment reviewing prior documentation, coordinating care and discussing medical diagnosis and plan with the patient/family. Past medical history, allergies, medications were reviewed. Pertinent imaging, labs and tests included in this note have been reviewed and interpreted independently by me.  [place], MD [facility] 10/27/2021 4:31 PM  Office Number [phone]

## 2021-10-27 NOTE — Patient Instructions (Signed)
DLCO and Nitrogen washout completed.

## 2021-10-27 NOTE — Progress Notes (Signed)
Patient not feeling well today.DLCO and Nitrogen washout completed only.

## 2021-10-27 NOTE — Patient Instructions (Signed)
[place] for nebulizer  --START Duonebs TWO times a day or AS NEEDED --START Symbicort 160-4.5 mcg TWO puffs TWICE a day  Follow-up with me in 1 month

## 2021-11-03 ENCOUNTER — Telehealth: Payer: Self-pay | Admitting: Pulmonary Disease

## 2021-11-03 MED ORDER — IPRATROPIUM-ALBUTEROL 0.5-2.5 (3) MG/3ML IN SOLN: 3.0000 mL | Freq: Four times a day (QID) | RESPIRATORY_TRACT | 5 refills | Status: DC | PRN

## 2021-11-03 NOTE — Telephone Encounter (Signed)
Spoke with patient husband Wille Glaser.  [name] stated patient has been unable to get duo nebs prescribed at [place].  [name] stated [facility] has been unable to get duo nebs supply in stock.  [name] requested prescription be sent to [facility].  Requested prescription sent to requested [facility].  Nothing further at this time.

## 2021-11-24 ENCOUNTER — Other Ambulatory Visit: Payer: Self-pay

## 2021-11-24 ENCOUNTER — Encounter: Payer: Self-pay | Admitting: Pulmonary Disease

## 2021-11-24 ENCOUNTER — Ambulatory Visit: Payer: Medicare PPO | Admitting: Pulmonary Disease

## 2021-11-24 VITALS — BP 138/80 | HR 88 | Temp 97.7°F | Ht 64.0 in | Wt 125.6 lb

## 2021-11-24 DIAGNOSIS — U099 Post covid-19 condition, unspecified: Secondary | ICD-10-CM | POA: Diagnosis not present

## 2021-11-24 DIAGNOSIS — R0609 Other forms of dyspnea: Secondary | ICD-10-CM

## 2021-11-24 NOTE — Patient Instructions (Signed)
[place] with shortness of breath ?--CONTINUE Duonebs TWO times a day or AS NEEDED ?--CONTINUE Symbicort 160-4.5 mcg TWO puffs TWICE a day ?--Hold on referring to Pulmonary Rehab. Declined due to severe fatigue ? ?Follow-up with me in 3 months ?

## 2021-11-24 NOTE — Progress Notes (Signed)
Subjective:   PATIENT ID: Jodi Valdez GENDER: female DOB: 05/01/1936, MRN: [id number]   HPI  Chief Complaint  Patient presents with   Follow-up    Fatigue     Reason for Visit: Follow-up  [name] is a 86 year old year old never smoker with allergic rhinitis, LPR, hx breast cancer s/p chemoradiation and GERD who presents for follow-up  Synopsis: Began having shortness of breath one year. Not associated with cough or wheezing. Around this time, she was admitted for pneumonia. Did not need oxygen on discharge.  She will have symptoms at least once a week that limit her from doing housework, caring for her plants and three little dogs. At baseline she reports being active. On days she is more symptomatic she feels she cannot do anything and often wakes up feeling like she is having a bad day and has difficulty taking breath. Denies chest pain or tightness. No clear correlation with extremes in weather or humidity, pet exposure. Has tried albuterol for 2-3 weeks daily but did not have any relief. She reports her COVID-19 antibody tested positive and states she lost her sense of taste around May 2022.  08/20/21 She continues shortness of breath with exertion. She has tried Librarian, academic without any relief. Exertion will worsen it. She previously was active in the yard and now feels like she is sitting most of the time. She is able to walk short distances. She previously had edema in her legs but that has improved with lasix, managed by her cardiologist. She had been hospitalized at [facility] in October.  10/27/21 Since our last visit, she reports chronic fatigue, shortness of breath, loss of appetite, poor difficulty concentrating. It has been over a year since her COVID diagnosis. She has tried albuterol three times a day for a month before stopping. Sometimes in evening her breathing may clear up briefly. Denies coughing or wheezing.  11/24/21 She presents with her husband  who provides additional history.  Since our last visit she was started on Symbicort and Duonebs.  Feels this has improved her shortness of breath and she is not using her albuterol as often denies coughing or wheezing.  She has good days and bad days which is an improvement compared to before when she was mostly having bad days. This week she walked the dog and able to tolerate it.  She is looking forward to warmer days so that she can increase her activity level.  Her appetite remains poor but she is drinking boost and taking vitamins  Social History: Never smoker No child respiratory infections or diagnosis  Past Medical History:  Diagnosis Date   Allergy    Anemia    Asthma    Breast cancer ([place])    Cancer ([place]) 11/01/2000   left breast cancer   GERD (gastroesophageal reflux disease)    Hyperlipidemia    Hypertension    Personal history of chemotherapy    2002   Personal history of radiation therapy    2002   Polymyalgia ([place])      Family History  Problem Relation Age of Onset   Colon cancer Neg Hx    Colon polyps Neg Hx    Diabetes Neg Hx    Kidney disease Neg Hx    Esophageal cancer Neg Hx      Social History   Occupational History   Occupation: Retired    Fish farm manager: RETIRED  Tobacco Use   Smoking status: Never   Smokeless  tobacco: Never  Vaping Use   Vaping Use: Never used  Substance and Sexual Activity   Alcohol use: No    Alcohol/week: 0.0 standard drinks   Drug use: No   Sexual activity: Not on file    Allergies  Allergen Reactions   Propranolol Other (See Comments)    Other reaction(s): Other (See Comments) hallucinations hallucinations Other reaction(s): Other (See Comments) hallucinations hallucinations Other reaction(s): Other (See Comments) hallucinations  Other reaction(s): Other (See Comments) hallucinations hallucinations Other reaction(s): Other (See Comments) hallucinations   Codeine Nausea And Vomiting   Nitrofuran Derivatives  Nausea And Vomiting     Outpatient Medications Prior to Visit  Medication Sig Dispense Refill   alendronate (FOSAMAX) 70 MG tablet Take 70 mg by mouth once a week.     Ascorbic Acid (VITAMIN C PO) Take 1,000 mg by mouth daily.     azelastine (ASTELIN) 0.1 % nasal spray Place 1 spray into both nostrils as needed for allergies.     carvedilol (COREG) 6.25 MG tablet Take 6.25 mg by mouth 2 (two) times daily with a meal.     Cholecalciferol (VITAMIN D3) 1000 UNITS CAPS Take 1,000 Units by mouth 2 (two) times daily.     EPINEPHrine 0.3 mg/0.3 mL IJ SOAJ injection Use as directed for life-threatening allergic reaction. 2 Device 3   famotidine (PEPCID) 20 MG tablet Take 20 mg by mouth 2 (two) times daily.     furosemide (LASIX) 40 MG tablet Take 20 mg by mouth daily.     ipratropium-albuterol (DUONEB) 0.5-2.5 (3) MG/3ML SOLN Take 3 mLs by nebulization every 6 (six) hours as needed. 360 mL 5   levothyroxine (SYNTHROID) 125 MCG tablet Take 88 mcg by mouth in the morning.     losartan (COZAAR) 50 MG tablet Take 50 mg by mouth daily.     metFORMIN (GLUCOPHAGE) 500 MG tablet Take 500 mg by mouth daily with breakfast.     Multiple Vitamins-Minerals (PRESERVISION/LUTEIN PO) Take 1 tablet by mouth 2 (two) times daily. Unknown strength     pravastatin (PRAVACHOL) 20 MG tablet Take 20 mg by mouth at bedtime.     predniSONE (DELTASONE) 1 MG tablet Take 8 mg by mouth daily.     Spacer/Aero-Holding Josiah Lobo DEVI As directed 1 each 0   traZODone (DESYREL) 50 MG tablet Take 50 mg by mouth at bedtime as needed for sleep.     albuterol (VENTOLIN HFA) 108 (90 Base) MCG/ACT inhaler Inhale 2 puffs into the lungs every 6 (six) hours as needed for wheezing or shortness of breath. (Patient not taking: Reported on 10/27/2021) 8 g 2   budesonide-formoterol (SYMBICORT) 160-4.5 MCG/ACT inhaler Inhale 2 puffs into the lungs in the morning and at bedtime. (Patient not taking: Reported on 11/24/2021) 1 each 5   potassium chloride SA  (KLOR-CON) 20 MEQ tablet Take 1 tablet (20 mEq total) by mouth daily. (Patient taking differently: Take 10 mEq by mouth daily.) 30 tablet 1   No facility-administered medications prior to visit.    Review of Systems  Constitutional:  Positive for malaise/fatigue. Negative for chills, diaphoresis, fever and weight loss.  HENT:  Negative for congestion.   Respiratory:  Positive for shortness of breath. Negative for cough, hemoptysis, sputum production and wheezing.   Cardiovascular:  Negative for chest pain, palpitations and leg swelling.    Objective:   Vitals:   11/24/21 1448  BP: 138/80  Pulse: 88  Temp: 97.7 F (36.5 C)  SpO2: 97%  Weight:  125 lb 9.6 oz (57 kg)  Height: '5\' 4"'$  (1.626 m)   SpO2: 97 % O2 Device: None (Room air)  Physical Exam: General: Chronically ill-appearing, no acute distress HENT: [place], AT Eyes: EOMI, no scleral icterus Respiratory: Diminished breath sounds to auscultation bilaterally.  No crackles, wheezing or rales Cardiovascular: RRR, -M/R/G, no JVD Extremities:-Edema,-tenderness Neuro: AAO x4, CNII-XII grossly intact Psych: Normal mood, normal affect   Data Reviewed:  Imaging: CT chest/abdomen/pelvis 12/10/2017 (report only)- Mild bilateral dependent atelectasis.  Mild chronic subpleural scarring lingula.  Isolated paraseptal emphysema in the medial upper lobe. CT 07/18/21 - Chronic appearing subpleural bibasilar scarring. Associated with ground glass  PFT: 09/09/2020 FVC 2.46 (103%) FEV1 2.24 (126%) Ratio 91  Interpretation: Normal spirometry  Labs: CBC    Component Value Date/Time   WBC 15.5 (H) 06/10/2021 1333   RBC 4.10 06/10/2021 1333   HGB 12.9 06/10/2021 1333   HCT 40.1 06/10/2021 1333   PLT 277 06/10/2021 1333   MCV 97.8 06/10/2021 1333   MCH 31.5 06/10/2021 1333   MCHC 32.2 06/10/2021 1333   RDW 13.7 06/10/2021 1333   LYMPHSABS 1.9 06/10/2021 1333   MONOABS 0.6 06/10/2021 1333   EOSABS 0.0 06/10/2021 1333   BASOSABS 0.0  06/10/2021 1333   Absolute eosinophils 06/04/2020-200     Assessment & Plan:   Discussion: 86 year old female never smoker with COVID long hauler (since May 0174), chronic systolic heart failure, history of breast cancer status post chemoradiation and GERD who presents for follow-up.  Improved dyspnea on ICS, LABA and scheduled DuoNebs. We reviewed clinical course of COVID-19 including long-term complications including post-inflammatory lung disease and long hauler symptoms. Counseled on regular activity as deconditioning is likely contributing to her symptoms.  She again declines pulmonary rehab and would like to work on activity at her own pace.  [place] with shortness of breath --CONTINUE Duonebs TWO times a day or AS NEEDED --CONTINUE Symbicort 160-4.5 mcg TWO puffs TWICE a day --Previously to Pulmonary Rehab. Declined due to severe fatigue   Health Maintenance Immunization History  Administered Date(s) Administered   Fluad Quad(high Dose 65+) 06/20/2021   Influenza-Unspecified 05/22/2013   PFIZER Comirnaty(Gray Top)Covid-19 Tri-Sucrose Vaccine 08/09/2020   PFIZER(Purple Top)SARS-COV-2 Vaccination 11/03/2019, 11/28/2019   Pfizer Covid-19 Vaccine Bivalent Booster 5y-11y 06/03/2021   CT Lung Screen-not qualified never smoker  No orders of the defined types were placed in this encounter.  No orders of the defined types were placed in this encounter.  Return in about 3 months (around 02/24/2022).    I have spent a total time of 33-minutes on the day of the appointment reviewing prior documentation, coordinating care and discussing medical diagnosis and plan with the patient/family. Past medical history, allergies, medications were reviewed. Pertinent imaging, labs and tests included in this note have been reviewed and interpreted independently by me.  Hanna, MD [facility] 11/24/2021 3:08 PM  Office Number [phone]

## 2021-12-09 DIAGNOSIS — U099 Post covid-19 condition, unspecified: Secondary | ICD-10-CM | POA: Diagnosis not present

## 2021-12-09 DIAGNOSIS — F330 Major depressive disorder, recurrent, mild: Secondary | ICD-10-CM | POA: Diagnosis not present

## 2021-12-09 DIAGNOSIS — R41840 Attention and concentration deficit: Secondary | ICD-10-CM | POA: Diagnosis not present

## 2021-12-09 DIAGNOSIS — Z6821 Body mass index (BMI) 21.0-21.9, adult: Secondary | ICD-10-CM | POA: Diagnosis not present

## 2021-12-11 DIAGNOSIS — Z9181 History of falling: Secondary | ICD-10-CM | POA: Diagnosis not present

## 2021-12-11 DIAGNOSIS — Z1331 Encounter for screening for depression: Secondary | ICD-10-CM | POA: Diagnosis not present

## 2021-12-11 DIAGNOSIS — E785 Hyperlipidemia, unspecified: Secondary | ICD-10-CM | POA: Diagnosis not present

## 2021-12-11 DIAGNOSIS — Z0000 Encounter for general adult medical examination without abnormal findings: Secondary | ICD-10-CM | POA: Diagnosis not present

## 2021-12-15 DIAGNOSIS — R309 Painful micturition, unspecified: Secondary | ICD-10-CM | POA: Diagnosis not present

## 2021-12-15 DIAGNOSIS — N3091 Cystitis, unspecified with hematuria: Secondary | ICD-10-CM | POA: Diagnosis not present

## 2022-01-01 ENCOUNTER — Ambulatory Visit: Payer: Medicare PPO | Admitting: Cardiology

## 2022-01-01 VITALS — BP 130/60 | HR 96 | Ht 64.0 in | Wt 121.4 lb

## 2022-01-01 DIAGNOSIS — E782 Mixed hyperlipidemia: Secondary | ICD-10-CM | POA: Diagnosis not present

## 2022-01-01 DIAGNOSIS — F330 Major depressive disorder, recurrent, mild: Secondary | ICD-10-CM | POA: Diagnosis not present

## 2022-01-01 DIAGNOSIS — I10 Essential (primary) hypertension: Secondary | ICD-10-CM

## 2022-01-01 DIAGNOSIS — U099 Post covid-19 condition, unspecified: Secondary | ICD-10-CM | POA: Diagnosis not present

## 2022-01-01 DIAGNOSIS — R300 Dysuria: Secondary | ICD-10-CM | POA: Diagnosis not present

## 2022-01-01 DIAGNOSIS — I255 Ischemic cardiomyopathy: Secondary | ICD-10-CM

## 2022-01-01 DIAGNOSIS — R0609 Other forms of dyspnea: Secondary | ICD-10-CM

## 2022-01-01 NOTE — Patient Instructions (Signed)
Medication Instructions:  ?Your physician recommends that you continue on your current medications as directed. Please refer to the Current Medication list given to you today.  ?*If you need a refill on your cardiac medications before your next appointment, please call your pharmacy* ? ? ?Lab Work: ?None Ordered ?If you have labs (blood work) drawn today and your tests are completely normal, you will receive your results only by: ?MyChart Message (if you have MyChart) OR ?A paper copy in the mail ?If you have any lab test that is abnormal or we need to change your treatment, we will call you to review the results. ? ? ?Testing/Procedures: ?Your physician has requested that you have an echocardiogram.  ? ?Echocardiography is a painless test that uses sound waves to create images of your heart. It provides your doctor with information about the size and shape of your heart and how well your heart?s chambers and valves are working. This procedure takes approximately one hour. There are no restrictions for this procedure.  ? ? ?Follow-Up: ?At [facility], you and your health needs are our priority.  As part of our continuing mission to provide you with exceptional heart care, we have created designated Provider Care Teams.  These Care Teams include your primary Cardiologist (physician) and Advanced Practice Providers (APPs -  Physician Assistants and Nurse Practitioners) who all work together to provide you with the care you need, when you need it. ? ?We recommend signing up for the patient portal called "MyChart".  Sign up information is provided on this After Visit Summary.  MyChart is used to connect with patients for Virtual Visits (Telemedicine).  Patients are able to view lab/test results, encounter notes, upcoming appointments, etc.  Non-urgent messages can be sent to your provider as well.   ?To learn more about what you can do with MyChart, go to [email or web address].   ? ?Your next appointment:   ?3  month(s) ? ?The format for your next appointment:   ?In Person ? ?Provider:   ?Jenne Campus, MD ? ?Other Instructions ?NOne ? ?Important Information About Sugar ? ? ? ? ?  ?

## 2022-01-01 NOTE — Addendum Note (Signed)
Addended by: Jacobo Forest D on: 01/01/2022 03:18 PM ? ? Modules accepted: Orders ? ?

## 2022-01-01 NOTE — Progress Notes (Signed)
?Cardiology Office Note:   ? ?Date:  01/01/2022  ? ?ID:  Jodi Valdez, DOB Oct 05, 1935, MRN [id number] ? ?PCP:  Leonides Sake, MD  ?Cardiologist:  Jenne Campus, MD   ? ?Referring MD: Leonides Sake, MD  ? ?Chief Complaint  ?Patient presents with  ? Follow-up  ?  x  ?I am not doing well and exhausted tired and short of breath ? ?History of Present Illness:   ? ?Jodi Valdez is a 86 y.o. female past medical history significant for essential hypertension, breast CA, cardiomyopathy ejection fraction 50 to 55% however in the fall of last year she went to our hospital because of swollen legs, echocardiogram has been repeated ejection fraction 4045%.  She also had a stress test done at that time which showed no evidence of ischemia but potentially old myocardial infarction.  She was also tested for pulmonary emboli which was negative. ?She is in my office today for follow-up.  About 2 months ago she suffered from COVID-19 infection.  She had a rough time recovering since that time she is being exhausted tired short of breath and not doing well at all.  Denies have any chest pain tightness squeezing pressure burning chest there is no shortness of breath. ? ?Past Medical History:  ?Diagnosis Date  ? Allergy   ? Anemia   ? Asthma   ? Breast cancer ([place])   ? Cancer ([place]) 11/01/2000  ? left breast cancer  ? GERD (gastroesophageal reflux disease)   ? Hyperlipidemia   ? Hypertension   ? Personal history of chemotherapy   ? 2002  ? Personal history of radiation therapy   ? 2002  ? Polymyalgia ([place])   ? ? ?Past Surgical History:  ?Procedure Laterality Date  ? ABDOMINAL HYSTERECTOMY  1975  ? BREAST SURGERY Left 11/01/2000  ? masectomy  ? COLONOSCOPY  2015  ? CYSTOCELE REPAIR  09/1999  ? MASTECTOMY Left   ? 2002 with tramflap  ? RECTOCELE REPAIR  09/1999  ? ? ?Current Medications: ?Current Meds  ?Medication Sig  ? albuterol (VENTOLIN HFA) 108 (90 Base) MCG/ACT inhaler Inhale 2 puffs into the lungs every 6 (six)  hours as needed for wheezing or shortness of breath.  ? alendronate (FOSAMAX) 70 MG tablet Take 70 mg by mouth once a week.  ? Ascorbic Acid (VITAMIN C PO) Take 1,000 mg by mouth daily.  ? azelastine (ASTELIN) 0.1 % nasal spray Place 1 spray into both nostrils as needed for allergies.  ? budesonide-formoterol (SYMBICORT) 160-4.5 MCG/ACT inhaler Inhale 2 puffs into the lungs in the morning and at bedtime.  ? carvedilol (COREG) 6.25 MG tablet Take 6.25 mg by mouth 2 (two) times daily with a meal.  ? Cholecalciferol (VITAMIN D3) 1000 UNITS CAPS Take 1,000 Units by mouth 2 (two) times daily.  ? EPINEPHrine 0.3 mg/0.3 mL IJ SOAJ injection Use as directed for life-threatening allergic reaction. (Patient taking differently: Inject 0.3 mg into the muscle as needed for anaphylaxis. Use as directed for life-threatening allergic reaction.)  ? famotidine (PEPCID) 20 MG tablet Take 20 mg by mouth 2 (two) times daily.  ? furosemide (LASIX) 40 MG tablet Take 20 mg by mouth daily.  ? ipratropium-albuterol (DUONEB) 0.5-2.5 (3) MG/3ML SOLN Take 3 mLs by nebulization every 6 (six) hours as needed. (Patient taking differently: Take 3 mLs by nebulization every 6 (six) hours as needed (sob).)  ? levothyroxine (SYNTHROID) 125 MCG tablet Take 88 mcg by mouth in the morning.  ?  losartan (COZAAR) 50 MG tablet Take 50 mg by mouth daily.  ? metFORMIN (GLUCOPHAGE) 500 MG tablet Take 500 mg by mouth daily with breakfast.  ? Multiple Vitamins-Minerals (PRESERVISION/LUTEIN PO) Take 1 tablet by mouth 2 (two) times daily. Unknown strength  ? potassium chloride SA (KLOR-CON) 20 MEQ tablet Take 1 tablet (20 mEq total) by mouth daily. (Patient taking differently: Take 10 mEq by mouth daily.)  ? pravastatin (PRAVACHOL) 20 MG tablet Take 20 mg by mouth at bedtime.  ? predniSONE (DELTASONE) 1 MG tablet Take 8 mg by mouth daily.  ? Spacer/Aero-Holding Dorise Bullion As directed (Patient taking differently: 1 each by Other route See admin instructions. As  directed)  ? traZODone (DESYREL) 50 MG tablet Take 50 mg by mouth at bedtime as needed for sleep.  ?  ? ?Allergies:   Propranolol, Codeine, and Nitrofuran derivatives  ? ?Social History  ? ?Socioeconomic History  ? Marital status: Married  ?  Spouse name: Not on file  ? Number of children: 2  ? Years of education: Not on file  ? Highest education level: Not on file  ?Occupational History  ? Occupation: Retired  ?  Employer: RETIRED  ?Tobacco Use  ? Smoking status: Never  ? Smokeless tobacco: Never  ?Vaping Use  ? Vaping Use: Never used  ?Substance and Sexual Activity  ? Alcohol use: No  ?  Alcohol/week: 0.0 standard drinks  ? Drug use: No  ? Sexual activity: Not on file  ?Other Topics Concern  ? Not on file  ?Social History Narrative  ? Not on file  ? ?Social Determinants of Health  ? ?Financial Resource Strain: Low Risk   ? Difficulty of Paying Living Expenses: Not hard at all  ?Food Insecurity: No Food Insecurity  ? Worried About Charity fundraiser in the Last Year: Never true  ? Ran Out of Food in the Last Year: Never true  ?Transportation Needs: No Transportation Needs  ? Lack of Transportation (Medical): No  ? Lack of Transportation (Non-Medical): No  ?Physical Activity: Inactive  ? Days of Exercise per Week: 0 days  ? Minutes of Exercise per Session: 0 min  ?Stress: Stress Concern Present  ? Feeling of Stress : To some extent  ?Social Connections: Moderately Integrated  ? Frequency of Communication with Friends and Family: Twice a week  ? Frequency of Social Gatherings with Friends and Family: Twice a week  ? Attends Religious Services: More than 4 times per year  ? Active Member of Clubs or Organizations: No  ? Attends Archivist Meetings: Never  ? Marital Status: Married  ?  ? ?Family History: ?The patient's family history is negative for Colon cancer, Colon polyps, Diabetes, Kidney disease, and Esophageal cancer. ?ROS:   ?Please see the history of present illness.    ?All 14 point review of  systems negative except as described per history of present illness ? ?EKGs/Labs/Other Studies Reviewed:   ? ? ? ?Recent Labs: ?06/10/2021: B Natriuretic Peptide 66.6; Hemoglobin 12.9; Platelets 277; TSH 2.082 ?09/30/2021: ALT 19; BUN 23; Creatinine, Ser 0.98; NT-Pro BNP 574; Potassium 4.5; Sodium 139  ?Recent Lipid Panel ?No results found for: CHOL, TRIG, HDL, CHOLHDL, VLDL, LDLCALC, LDLDIRECT ? ?Physical Exam:   ? ?VS:  BP 130/60 (BP Location: Left Arm, Patient Position: Sitting)   Pulse 96   Ht '5\' 4"'$  (1.626 m)   Wt 121 lb 6.4 oz (55.1 kg)   SpO2 98%   BMI 20.84 kg/m?    ? ?  Wt Readings from Last 3 Encounters:  ?01/01/22 121 lb 6.4 oz (55.1 kg)  ?11/24/21 125 lb 9.6 oz (57 kg)  ?10/27/21 123 lb (55.8 kg)  ?  ? ?GEN:  Well nourished, well developed in no acute distress ?HEENT: Normal ?NECK: No JVD; No carotid bruits ?LYMPHATICS: No lymphadenopathy ?CARDIAC: RRR, no murmurs, no rubs, no gallops ?RESPIRATORY:  Clear to auscultation without rales, wheezing or rhonchi  ?ABDOMEN: Soft, non-tender, non-distended ?MUSCULOSKELETAL:  No edema; No deformity  ?SKIN: Warm and dry ?LOWER EXTREMITIES: no swelling ?NEUROLOGIC:  Alert and oriented x 3 ?PSYCHIATRIC:  Normal affect  ? ?ASSESSMENT:   ? ?1. Ischemic cardiomyopathy   ?2. Mixed hyperlipidemia   ?3. COVID-19 long hauler manifesting chronic dyspnea   ?4. Essential hypertension   ? ?PLAN:   ? ?In order of problems listed above: ? ?Ischemic cardiomyopathy she is feeling awful however I do not see any signs and symptoms of congestive heart failure physical exam I asked her to do echocardiogram to reassess left ventricle ejection fraction 20 to get out if her symptomatology to any degree is related to her heart if her heart is weak and that it was before we need to switch her from losartan to [facility]. ?Mixed dyslipidemia I did review her K PN which show me her LDL of 87 HDL 39 this is from August 25, 2021 but with her condition right now feeling so poorly I will not  change any of her medications. ?History of COVID-19 it looks like she is heading towards a long COVID she is suffering a lot struggling being tired exhausted.  I will do evaluation of her heart make sure that none of

## 2022-01-06 ENCOUNTER — Ambulatory Visit (INDEPENDENT_AMBULATORY_CARE_PROVIDER_SITE_OTHER): Payer: Medicare PPO

## 2022-01-06 DIAGNOSIS — I255 Ischemic cardiomyopathy: Secondary | ICD-10-CM

## 2022-01-06 LAB — ECHOCARDIOGRAM COMPLETE
Area-P 1/2: 8.43 cm2
Calc EF: 48.4 %
S' Lateral: 1.8 cm
Single Plane A2C EF: 48 %
Single Plane A4C EF: 50 %

## 2022-01-20 DIAGNOSIS — I5020 Unspecified systolic (congestive) heart failure: Secondary | ICD-10-CM | POA: Diagnosis not present

## 2022-01-20 DIAGNOSIS — E039 Hypothyroidism, unspecified: Secondary | ICD-10-CM | POA: Diagnosis not present

## 2022-01-20 DIAGNOSIS — I700 Atherosclerosis of aorta: Secondary | ICD-10-CM | POA: Diagnosis not present

## 2022-01-20 DIAGNOSIS — F330 Major depressive disorder, recurrent, mild: Secondary | ICD-10-CM | POA: Diagnosis not present

## 2022-01-20 DIAGNOSIS — E538 Deficiency of other specified B group vitamins: Secondary | ICD-10-CM | POA: Diagnosis not present

## 2022-01-20 DIAGNOSIS — E099 Drug or chemical induced diabetes mellitus without complications: Secondary | ICD-10-CM | POA: Diagnosis not present

## 2022-01-20 DIAGNOSIS — N1831 Chronic kidney disease, stage 3a: Secondary | ICD-10-CM | POA: Diagnosis not present

## 2022-01-20 DIAGNOSIS — I10 Essential (primary) hypertension: Secondary | ICD-10-CM | POA: Diagnosis not present

## 2022-01-20 DIAGNOSIS — E778 Other disorders of glycoprotein metabolism: Secondary | ICD-10-CM | POA: Diagnosis not present

## 2022-01-20 DIAGNOSIS — T380X5A Adverse effect of glucocorticoids and synthetic analogues, initial encounter: Secondary | ICD-10-CM | POA: Diagnosis not present

## 2022-01-20 DIAGNOSIS — E559 Vitamin D deficiency, unspecified: Secondary | ICD-10-CM | POA: Diagnosis not present

## 2022-02-03 ENCOUNTER — Inpatient Hospital Stay: Payer: Medicare PPO | Admitting: Oncology

## 2022-02-05 DIAGNOSIS — N390 Urinary tract infection, site not specified: Secondary | ICD-10-CM | POA: Diagnosis not present

## 2022-02-06 DIAGNOSIS — B029 Zoster without complications: Secondary | ICD-10-CM | POA: Diagnosis not present

## 2022-02-06 DIAGNOSIS — F330 Major depressive disorder, recurrent, mild: Secondary | ICD-10-CM | POA: Diagnosis not present

## 2022-02-09 ENCOUNTER — Ambulatory Visit: Payer: Medicare PPO | Admitting: Oncology

## 2022-02-19 DIAGNOSIS — I10 Essential (primary) hypertension: Secondary | ICD-10-CM | POA: Diagnosis not present

## 2022-02-19 DIAGNOSIS — B029 Zoster without complications: Secondary | ICD-10-CM | POA: Diagnosis not present

## 2022-02-22 DIAGNOSIS — B021 Zoster meningitis: Secondary | ICD-10-CM | POA: Diagnosis not present

## 2022-02-22 DIAGNOSIS — N3091 Cystitis, unspecified with hematuria: Secondary | ICD-10-CM | POA: Diagnosis not present

## 2022-02-22 DIAGNOSIS — N3000 Acute cystitis without hematuria: Secondary | ICD-10-CM | POA: Diagnosis not present

## 2022-03-26 ENCOUNTER — Other Ambulatory Visit: Payer: Self-pay | Admitting: Cardiology

## 2022-04-03 ENCOUNTER — Ambulatory Visit: Payer: Medicare PPO | Admitting: Cardiology

## 2022-04-03 ENCOUNTER — Telehealth: Payer: Self-pay

## 2022-04-03 DIAGNOSIS — J0100 Acute maxillary sinusitis, unspecified: Secondary | ICD-10-CM | POA: Diagnosis not present

## 2022-04-03 NOTE — Telephone Encounter (Signed)
Called and left voicemail for patient to call office back to schedule follow up with Dr Loanne Drilling.

## 2022-04-03 NOTE — Telephone Encounter (Signed)
-----   Message from [place], MD sent at 04/03/2022  8:32 AM EDT ----- Regarding: Overdue follow-up Patient with overdue follow-up. Not urgent. Please schedule when convenient for patient

## 2022-04-04 ENCOUNTER — Encounter (HOSPITAL_COMMUNITY): Payer: Self-pay

## 2022-04-04 ENCOUNTER — Emergency Department (HOSPITAL_COMMUNITY)
Admission: EM | Admit: 2022-04-04 | Discharge: 2022-04-04 | Disposition: A | Discharge status: Home / Self Care | Payer: Medicare PPO | Attending: Emergency Medicine | Admitting: Emergency Medicine

## 2022-04-04 ENCOUNTER — Other Ambulatory Visit: Payer: Self-pay

## 2022-04-04 ENCOUNTER — Emergency Department (HOSPITAL_COMMUNITY): Payer: Medicare PPO

## 2022-04-04 DIAGNOSIS — R0982 Postnasal drip: Secondary | ICD-10-CM | POA: Diagnosis not present

## 2022-04-04 DIAGNOSIS — R0981 Nasal congestion: Secondary | ICD-10-CM | POA: Diagnosis not present

## 2022-04-04 DIAGNOSIS — R052 Subacute cough: Secondary | ICD-10-CM

## 2022-04-04 DIAGNOSIS — M7918 Myalgia, other site: Secondary | ICD-10-CM | POA: Diagnosis not present

## 2022-04-04 DIAGNOSIS — R0602 Shortness of breath: Secondary | ICD-10-CM | POA: Diagnosis not present

## 2022-04-04 DIAGNOSIS — Z8616 Personal history of COVID-19: Secondary | ICD-10-CM | POA: Insufficient documentation

## 2022-04-04 LAB — CBC WITH DIFFERENTIAL/PLATELET
Abs Immature Granulocytes: 0.18 10*3/uL — ABNORMAL HIGH (ref 0.00–0.07)
Basophils Absolute: 0.1 10*3/uL (ref 0.0–0.1)
Basophils Relative: 1 %
Eosinophils Absolute: 0.2 10*3/uL (ref 0.0–0.5)
Eosinophils Relative: 1 %
HCT: 37.4 % (ref 36.0–46.0)
Hemoglobin: 12.4 g/dL (ref 12.0–15.0)
Immature Granulocytes: 1 %
Lymphocytes Relative: 18 %
Lymphs Abs: 2.2 10*3/uL (ref 0.7–4.0)
MCH: 33 pg (ref 26.0–34.0)
MCHC: 33.2 g/dL (ref 30.0–36.0)
MCV: 99.5 fL (ref 80.0–100.0)
Monocytes Absolute: 0.7 10*3/uL (ref 0.1–1.0)
Monocytes Relative: 5 %
Neutro Abs: 9.2 10*3/uL — ABNORMAL HIGH (ref 1.7–7.7)
Neutrophils Relative %: 74 %
Platelets: 271 10*3/uL (ref 150–400)
RBC: 3.76 MIL/uL — ABNORMAL LOW (ref 3.87–5.11)
RDW: 13.5 % (ref 11.5–15.5)
WBC: 12.5 10*3/uL — ABNORMAL HIGH (ref 4.0–10.5)
nRBC: 0 % (ref 0.0–0.2)

## 2022-04-04 LAB — COMPREHENSIVE METABOLIC PANEL
ALT: 21 U/L (ref 0–44)
AST: 26 U/L (ref 15–41)
Albumin: 3 g/dL — ABNORMAL LOW (ref 3.5–5.0)
Alkaline Phosphatase: 32 U/L — ABNORMAL LOW (ref 38–126)
Anion gap: 13 (ref 5–15)
BUN: 16 mg/dL (ref 8–23)
CO2: 25 mmol/L (ref 22–32)
Calcium: 9.1 mg/dL (ref 8.9–10.3)
Chloride: 103 mmol/L (ref 98–111)
Creatinine, Ser: 0.85 mg/dL (ref 0.44–1.00)
GFR, Estimated: >60 mL/min (ref >60)
Glucose, Bld: 144 mg/dL — ABNORMAL HIGH (ref 70–99)
Potassium: 4 mmol/L (ref 3.5–5.1)
Sodium: 141 mmol/L (ref 135–145)
Total Bilirubin: 0.9 mg/dL (ref 0.3–1.2)
Total Protein: 5.3 g/dL — ABNORMAL LOW (ref 6.5–8.1)

## 2022-04-04 LAB — URINALYSIS, ROUTINE W REFLEX MICROSCOPIC
Bilirubin Urine: NEGATIVE
Glucose, UA: NEGATIVE mg/dL
Hgb urine dipstick: NEGATIVE
Ketones, ur: NEGATIVE mg/dL
Nitrite: NEGATIVE
Protein, ur: NEGATIVE mg/dL
Specific Gravity, Urine: 1.01 (ref 1.005–1.030)
pH: 6 (ref 5.0–8.0)

## 2022-04-04 LAB — TROPONIN I (HIGH SENSITIVITY)
Troponin I (High Sensitivity): 15 ng/L (ref <18)
Troponin I (High Sensitivity): 14 ng/L (ref <18)

## 2022-04-04 NOTE — ED Triage Notes (Signed)
Pt arrived POV from home c/o generalized body aches. Pt states several months ago she tested positive for covid and then had singles a month ago and has had generalized body aches ever since and she states she cannot go on like this anymore.

## 2022-04-04 NOTE — ED Provider Notes (Signed)
[facility] EMERGENCY DEPARTMENT Provider Note   CSN: [id number] Arrival date & time: 04/04/22  0840     History  Chief Complaint  Patient presents with   Generalized Body Aches    Jodi Valdez is a 86 y.o. female.  Patient complains of having difficulty breathing.  Patient reports she has long-haul COVID and has had complications since her COVID infection.  Patient reports she also had shingles on the right side.  The skin lesions have resolved however she still has pain in this area.  Patient complains of chronic nasal drainage and congestion.  Patient complains of feeling bad all the time.  Patient states that she cannot go on like this and she feels like she wants to die.  Patient was seen yesterday at urgent care and given a prescription for doxycycline.  It was thought that her nasal drainage could be coming from some sinus disease she reports having a chest x-ray which was normal.  Patient reports decreased activity tolerance.        Home Medications Prior to Admission medications   Medication Sig Start Date End Date Taking? Authorizing Provider  albuterol (VENTOLIN HFA) 108 (90 Base) MCG/ACT inhaler Inhale 2 puffs into the lungs every 6 (six) hours as needed for wheezing or shortness of breath. 08/20/21   Margaretha Seeds, MD  alendronate (FOSAMAX) 70 MG tablet Take 70 mg by mouth once a week. 12/12/13   [provider]  Ascorbic Acid (VITAMIN C PO) Take 1,000 mg by mouth daily.    [provider]  azelastine (ASTELIN) 0.1 % nasal spray Place 1 spray into both nostrils as needed for allergies. 06/17/21   [provider]  budesonide-formoterol (SYMBICORT) 160-4.5 MCG/ACT inhaler Inhale 2 puffs into the lungs in the morning and at bedtime. 10/27/21   Margaretha Seeds, MD  carvedilol (COREG) 6.25 MG tablet Take 6.25 mg by mouth 2 (two) times daily with a meal.    [provider]  Cholecalciferol (VITAMIN D3) 1000 UNITS  CAPS Take 1,000 Units by mouth 2 (two) times daily.    [provider]  EPINEPHrine 0.3 mg/0.3 mL IJ SOAJ injection Use as directed for life-threatening allergic reaction. Patient taking differently: Inject 0.3 mg into the muscle as needed for anaphylaxis. Use as directed for life-threatening allergic reaction. 12/13/17   Kozlow, Donnamarie Poag, MD  famotidine (PEPCID) 20 MG tablet Take 20 mg by mouth 2 (two) times daily.    [provider]  furosemide (LASIX) 40 MG tablet TAKE 1 TABLET BY MOUTH EVERY DAY 03/27/22   Park Liter, MD  ipratropium-albuterol (DUONEB) 0.5-2.5 (3) MG/3ML SOLN Take 3 mLs by nebulization every 6 (six) hours as needed. Patient taking differently: Take 3 mLs by nebulization every 6 (six) hours as needed (sob). 11/03/21   Margaretha Seeds, MD  levothyroxine (SYNTHROID) 125 MCG tablet Take 88 mcg by mouth in the morning. 04/04/20   [provider]  losartan (COZAAR) 50 MG tablet Take 50 mg by mouth daily.    [provider]  metFORMIN (GLUCOPHAGE) 500 MG tablet Take 500 mg by mouth daily with breakfast.    [provider]  Multiple Vitamins-Minerals (PRESERVISION/LUTEIN PO) Take 1 tablet by mouth 2 (two) times daily. Unknown strength    [provider]  potassium chloride SA (KLOR-CON) 20 MEQ tablet Take 1 tablet (20 mEq total) by mouth daily. Patient taking differently: Take 10 mEq by mouth daily. 08/12/21 01/01/22  Park Liter,  MD  pravastatin (PRAVACHOL) 20 MG tablet Take 20 mg by mouth at bedtime. 12/09/15   [provider]  predniSONE (DELTASONE) 1 MG tablet Take 8 mg by mouth daily. 01/12/19   [provider]  Spacer/Aero-Holding Dorise Bullion As directed Patient taking differently: 1 each by Other route See admin instructions. As directed 04/08/21   Margaretha Seeds, MD  traZODone (DESYREL) 50 MG tablet Take 50 mg by mouth at bedtime as needed for sleep. 05/18/20   [provider]       Allergies    Propranolol, Codeine, and Nitrofuran derivatives    Review of Systems   Review of Systems  HENT:  Positive for postnasal drip and rhinorrhea.   Respiratory:  Positive for cough and shortness of breath.   All other systems reviewed and are negative.   Physical Exam Updated Vital Signs BP (!) 176/85 (BP Location: Right Arm)   Pulse 95   Temp 98 F (36.7 C)   Resp 15   Ht '5\' 4"'$  (1.626 m)   Wt 49.9 kg   SpO2 98%   BMI 18.88 kg/m  Physical Exam Vitals and nursing note reviewed.  Constitutional:      Appearance: She is well-developed.  HENT:     Head: Normocephalic.     Right Ear: External ear normal.     Left Ear: External ear normal.     Nose: Nose normal.     Mouth/Throat:     Mouth: Mucous membranes are moist.  Eyes:     Pupils: Pupils are equal, round, and reactive to light.  Cardiovascular:     Rate and Rhythm: Normal rate.  Pulmonary:     Effort: Pulmonary effort is normal.  Abdominal:     General: Abdomen is flat. There is no distension.  Musculoskeletal:        General: Normal range of motion.     Cervical back: Normal range of motion.  Skin:    General: Skin is warm.  Neurological:     General: No focal deficit present.     Mental Status: She is alert and oriented to person, place, and time.  Psychiatric:        Mood and Affect: Mood normal.     ED Results / Procedures / Treatments   Labs (all labs ordered are listed, but only abnormal results are displayed) Labs Reviewed - No data to display  EKG None  Radiology No results found.  Procedures Procedures    Medications Ordered in ED Medications - No data to display  ED Course/ Medical Decision Making/ A&P                           Medical Decision Making Pt reports she has long haul covid and has difficulty breathing.  Pt is followed by Pulmonology.    Amount and/or Complexity of Data Reviewed Independent Historian: spouse    Details: Pt's husband is present and is  supportive. External Data Reviewed: notes.    Details: Pulmonary medicine notes reviewed Labs: ordered. Decision-making details documented in ED Course.    Details: labs ordered reviewed and interpreted.  Troponin is negavit.  Ua  no acute abnormaltiy Radiology: ordered and independent interpretation performed. Decision-making details documented in ED Course.    Details: Chest xray  no acute abnormality ECG/medicine tests: ordered and independent interpretation performed. Decision-making details documented in ED Course.   PT' 02 sat 99% consistently  Final Clinical Impression(s) / ED Diagnoses Final diagnoses:  Nasal congestion  Subacute cough    Rx / DC Orders ED Discharge Orders     None      An After Visit Summary was printed and given to the patient.    Fransico Meadow, [place] 04/04/22 [place]    Wyvonnia Dusky, MD 04/04/22 [phone]

## 2022-04-04 NOTE — Discharge Instructions (Addendum)
See your Pulmonary  Physician for recheck.  Take antibiotic as prescribed by your provider to see if this resolves sinus symptoms.  Try flonase nasal spray for symptoms

## 2022-04-04 NOTE — ED Notes (Signed)
Got patient into a gown patient is resting with call bell in reach and family at bedside got patient a warm blanket

## 2022-04-07 DIAGNOSIS — H61303 Acquired stenosis of external ear canal, unspecified, bilateral: Secondary | ICD-10-CM | POA: Diagnosis not present

## 2022-04-07 DIAGNOSIS — H6123 Impacted cerumen, bilateral: Secondary | ICD-10-CM | POA: Diagnosis not present

## 2022-04-08 DIAGNOSIS — Z7983 Long term (current) use of bisphosphonates: Secondary | ICD-10-CM | POA: Diagnosis not present

## 2022-04-08 DIAGNOSIS — R0981 Nasal congestion: Secondary | ICD-10-CM | POA: Diagnosis not present

## 2022-04-08 DIAGNOSIS — R0982 Postnasal drip: Secondary | ICD-10-CM | POA: Diagnosis not present

## 2022-04-08 DIAGNOSIS — R519 Headache, unspecified: Secondary | ICD-10-CM | POA: Diagnosis not present

## 2022-04-08 DIAGNOSIS — R531 Weakness: Secondary | ICD-10-CM | POA: Diagnosis not present

## 2022-04-08 DIAGNOSIS — Z20822 Contact with and (suspected) exposure to covid-19: Secondary | ICD-10-CM | POA: Diagnosis not present

## 2022-04-08 DIAGNOSIS — R059 Cough, unspecified: Secondary | ICD-10-CM | POA: Diagnosis not present

## 2022-04-08 DIAGNOSIS — U099 Post covid-19 condition, unspecified: Secondary | ICD-10-CM | POA: Diagnosis not present

## 2022-04-08 DIAGNOSIS — R0602 Shortness of breath: Secondary | ICD-10-CM | POA: Diagnosis not present

## 2022-04-08 DIAGNOSIS — R0600 Dyspnea, unspecified: Secondary | ICD-10-CM | POA: Diagnosis not present

## 2022-04-08 DIAGNOSIS — I10 Essential (primary) hypertension: Secondary | ICD-10-CM | POA: Diagnosis not present

## 2022-04-13 DIAGNOSIS — I10 Essential (primary) hypertension: Secondary | ICD-10-CM | POA: Diagnosis not present

## 2022-04-13 DIAGNOSIS — F4321 Adjustment disorder with depressed mood: Secondary | ICD-10-CM | POA: Diagnosis not present

## 2022-04-14 DIAGNOSIS — Z1231 Encounter for screening mammogram for malignant neoplasm of breast: Secondary | ICD-10-CM | POA: Diagnosis not present

## 2022-04-15 ENCOUNTER — Ambulatory Visit (INDEPENDENT_AMBULATORY_CARE_PROVIDER_SITE_OTHER): Payer: Medicare PPO | Admitting: Allergy and Immunology

## 2022-04-15 ENCOUNTER — Encounter: Payer: Self-pay | Admitting: Allergy and Immunology

## 2022-04-15 VITALS — BP 102/60 | HR 104 | Resp 20

## 2022-04-15 DIAGNOSIS — E063 Autoimmune thyroiditis: Secondary | ICD-10-CM

## 2022-04-15 DIAGNOSIS — K219 Gastro-esophageal reflux disease without esophagitis: Secondary | ICD-10-CM

## 2022-04-15 DIAGNOSIS — J3089 Other allergic rhinitis: Secondary | ICD-10-CM

## 2022-04-15 DIAGNOSIS — G4720 Circadian rhythm sleep disorder, unspecified type: Secondary | ICD-10-CM

## 2022-04-15 DIAGNOSIS — J4540 Moderate persistent asthma, uncomplicated: Secondary | ICD-10-CM

## 2022-04-15 DIAGNOSIS — R5381 Other malaise: Secondary | ICD-10-CM

## 2022-04-15 DIAGNOSIS — R5383 Other fatigue: Secondary | ICD-10-CM | POA: Diagnosis not present

## 2022-04-15 MED ORDER — BUDESONIDE 1 MG/2ML IN SUSP: RESPIRATORY_TRACT | 5 refills | Status: DC

## 2022-04-15 MED ORDER — PANTOPRAZOLE SODIUM 40 MG PO TBEC: DELAYED_RELEASE_TABLET | ORAL | 1 refills | Status: DC

## 2022-04-15 NOTE — Patient Instructions (Addendum)
  1.  Obtain a nocturnal oximetry study  2.  Obtain blood - AM cortisol, TSH, FT4, Thyroid Peroxidase ab  3. Treat reflux:  A. Pantoprazole 40 mg - 1 tablet 2 times per day  4. Treat inflammation:  A. Nebulized budesonide 1 mg - nebulize 2 times per day B. OTC Nasacort - 1 spray each nostril 2 times per day  5. If needed:  A. Albuterol nebulization every 4-6 hours  6. Return to clinic in 4 weeks or earlier if problem

## 2022-04-15 NOTE — Progress Notes (Unsigned)
[facility] [place] [facility] [place]   Follow-up Note  Referring Provider: Leonides Sake, MD Primary Provider: Leonides Sake, MD Date of Office Visit: 04/15/2022  Subjective:   Jodi Valdez (DOB: 1935/11/12) is a 86 y.o. female who returns to the [facility] [place] on 04/15/2022 in re-evaluation of the following:  HPI: Jodi Valdez returns to this clinic in evaluation of asthma, allergic rhinoconjunctivitis and LPR.  I have not seen her in this clinic since 09 September 2020.  Her main complaints today are that she hurts, she cannot breathe, she feels bed, she is about had it.    Apparently since I have seen her in this clinic she has been diagnosed with COVID infection with long-term sequela and is seen pulmonology and cardiology and has been given a collection of various agents to address some of her issues none of which have really helped her at all.  Apparently she is also had some diagnostic studies performed which have all been normal and do not tell her why she feels the way she does.  She has a very bad sleep with initiation insomnia and bed ability to maintain sleep and she is up all hours of the night.  This occurs even though she has been using trazodone with 3 tablets at bedtime.  Her husband states that he sees her panting at nighttime.  She also has some decreased ability to smell and some decreased appetite which has been a longstanding issue.  She thinks her reflux is doing pretty well and she apparently is using some type of therapy for reflux at this point.  She was given Wixela which has not helped her.  She has been given albuterol including albuterol nebulization which does not help her.  She is on chronic systemic steroids currently using prednisone 8 mg a day for a long history of polymyalgia rheumatica.  Allergies as of 04/15/2022       Reactions   Propranolol Other (See Comments)   Hallucinations   Gabapentin Other (See  Comments)   Hallucinations   Codeine Nausea And Vomiting   Nitrofuran Derivatives Nausea And Vomiting        Medication List    albuterol 108 (90 Base) MCG/ACT inhaler Commonly known as: VENTOLIN HFA Inhale 2 puffs into the lungs every 6 (six) hours as needed for wheezing or shortness of breath.   alendronate 70 MG tablet Commonly known as: FOSAMAX Take 70 mg by mouth once a week.   ALPRAZolam 0.25 MG tablet Commonly known as: XANAX Take 0.25 mg by mouth 2 (two) times daily.   amLODipine 2.5 MG tablet Commonly known as: NORVASC Take 2.5 mg by mouth daily.   carvedilol 6.25 MG tablet Commonly known as: COREG Take 6.25 mg by mouth 2 (two) times daily with a meal.   EPINEPHrine 0.3 mg/0.3 mL Soaj injection Commonly known as: EPI-PEN Use as directed for life-threatening allergic reaction.   famotidine 20 MG tablet Commonly known as: PEPCID Take 20 mg by mouth 2 (two) times daily.   furosemide 40 MG tablet Commonly known as: LASIX TAKE 1 TABLET BY MOUTH EVERY DAY   ipratropium-albuterol 0.5-2.5 (3) MG/3ML Soln Commonly known as: DUONEB Take 3 mLs by nebulization every 6 (six) hours as needed.   levocetirizine 5 MG tablet Commonly known as: XYZAL Take 5 mg by mouth daily.   levothyroxine 88 MCG tablet Commonly known as: SYNTHROID Take 88 mcg by mouth daily.   losartan 50  MG tablet Commonly known as: COZAAR Take 50 mg by mouth daily.   megestrol 40 MG/ML suspension Commonly known as: MEGACE Take 400 mg by mouth every morning.   potassium chloride SA 20 MEQ tablet Commonly known as: KLOR-CON M Take 1 tablet (20 mEq total) by mouth daily. What changed: how much to take   predniSONE 1 MG tablet Commonly known as: DELTASONE Take 3 mg by mouth daily.   predniSONE 5 MG tablet Commonly known as: DELTASONE Take 5 mg by mouth daily.   PRESERVISION/LUTEIN PO Take 1 tablet by mouth 2 (two) times daily. Unknown strength   Spacer/Aero-Holding Dorise Bullion As directed   traZODone 50 MG tablet Commonly known as: DESYREL Take 50 mg by mouth at bedtime as needed for sleep.   venlafaxine XR 75 MG 24 hr capsule Commonly known as: EFFEXOR-XR Take 75 mg by mouth daily.   Vitamin D3 25 MCG (1000 UT) Caps Take 1,000 Units by mouth 2 (two) times daily.   Wixela Inhub 250-50 MCG/ACT Aepb Generic drug: fluticasone-salmeterol Inhale 1 puff into the lungs in the morning and at bedtime.    Past Medical History:  Diagnosis Date   Allergy    Anemia    Asthma    Breast cancer ([place])    Cancer ([place]) 11/01/2000   left breast cancer   GERD (gastroesophageal reflux disease)    Hyperlipidemia    Hypertension    Personal history of chemotherapy    2002   Personal history of radiation therapy    2002   Polymyalgia [facility])     Past Surgical History:  Procedure Laterality Date   ABDOMINAL HYSTERECTOMY  1975   BREAST SURGERY Left 11/01/2000   masectomy   COLONOSCOPY  2015   CYSTOCELE REPAIR  09/1999   MASTECTOMY Left    2002 with tramflap   RECTOCELE REPAIR  09/1999    Review of systems negative except as noted in HPI / PMHx or noted below:  Review of Systems  Constitutional: Negative.   HENT: Negative.    Eyes: Negative.   Respiratory: Negative.    Cardiovascular: Negative.   Gastrointestinal: Negative.   Genitourinary: Negative.   Musculoskeletal: Negative.   Skin: Negative.   Neurological: Negative.   Endo/Heme/Allergies: Negative.   Psychiatric/Behavioral: Negative.       Objective:   Vitals:   04/15/22 1407  BP: (!) 98/56  Pulse: (!) 104  Resp: 20  SpO2: 95%          Physical Exam Constitutional:      Appearance: She is not diaphoretic.  HENT:     Head: Normocephalic.     Right Ear: Tympanic membrane, ear canal and external ear normal.     Left Ear: Tympanic membrane, ear canal and external ear normal.     Nose: Nose normal. No mucosal edema or rhinorrhea.     Mouth/Throat:     Pharynx: Uvula midline. No  oropharyngeal exudate.  Eyes:     Conjunctiva/sclera: Conjunctivae normal.  Neck:     Thyroid: No thyromegaly.     Trachea: Trachea normal. No tracheal tenderness or tracheal deviation.  Cardiovascular:     Rate and Rhythm: Normal rate and regular rhythm.     Heart sounds: Normal heart sounds, S1 normal and S2 normal. No murmur heard. Pulmonary:     Effort: No respiratory distress.     Breath sounds: Normal breath sounds. No stridor. No wheezing or rales.  Lymphadenopathy:     Head:  Right side of head: No tonsillar adenopathy.     Left side of head: No tonsillar adenopathy.     Cervical: No cervical adenopathy.  Skin:    Findings: No erythema or rash.     Nails: There is no clubbing.  Neurological:     Mental Status: She is alert.     Diagnostics:    Spirometry was performed and demonstrated an FEV1 of 1.81 at 98 % of predicted.  Assessment and Plan:   1. Other fatigue   2. Malaise   3. Not well controlled moderate persistent asthma   4. Perennial allergic rhinitis   5. Dysfunction of sleep stage or arousal     Patient Instructions   1.  Obtain a nocturnal oximetry study  2.  Obtain blood - AM cortisol, TSH, FT4, Thyroid Peroxidase ab  3. Treat reflux:  A. Pantoprazole 40 mg - 1 tablet 2 times per day  4. Treat inflammation:  A. Nebulized budesonide 1 mg - nebulize 2 times per day B. OTC Nasacort - 1 spray each nostril 2 times per day  5. If needed:  A. Albuterol nebulization every 4-6 hours  6. Return to clinic in 4 weeks or earlier if problem  Allena Katz, MD All[facility] / [place]

## 2022-04-16 ENCOUNTER — Telehealth: Payer: Self-pay

## 2022-04-16 ENCOUNTER — Other Ambulatory Visit: Payer: Self-pay | Admitting: Allergy and Immunology

## 2022-04-16 ENCOUNTER — Encounter: Payer: Self-pay | Admitting: Allergy and Immunology

## 2022-04-16 NOTE — Telephone Encounter (Signed)
Faxed order form for nocturnal oximetry to [facility].

## 2022-04-17 ENCOUNTER — Other Ambulatory Visit: Payer: Self-pay | Admitting: Cardiology

## 2022-04-17 DIAGNOSIS — U099 Post covid-19 condition, unspecified: Secondary | ICD-10-CM | POA: Diagnosis not present

## 2022-04-18 DIAGNOSIS — M25561 Pain in right knee: Secondary | ICD-10-CM | POA: Diagnosis not present

## 2022-04-18 DIAGNOSIS — M1711 Unilateral primary osteoarthritis, right knee: Secondary | ICD-10-CM | POA: Diagnosis not present

## 2022-04-21 DIAGNOSIS — R5383 Other fatigue: Secondary | ICD-10-CM | POA: Diagnosis not present

## 2022-04-21 DIAGNOSIS — R5381 Other malaise: Secondary | ICD-10-CM | POA: Diagnosis not present

## 2022-04-21 NOTE — Telephone Encounter (Signed)
Please change Pulmicort from 1 mg to 0.5 mg.

## 2022-04-23 LAB — TSH+FREE T4
Free T4: 1.81 ng/dL — ABNORMAL HIGH (ref 0.82–1.77)
TSH: 5.17 u[IU]/mL — ABNORMAL HIGH (ref 0.450–4.500)

## 2022-04-23 LAB — THYROID PEROXIDASE ANTIBODY: Thyroperoxidase Ab SerPl-aCnc: 509 IU/mL — ABNORMAL HIGH (ref 0–34)

## 2022-04-23 LAB — CORTISOL-AM, BLOOD: Cortisol - AM: 4.3 ug/dL — ABNORMAL LOW (ref 6.2–19.4)

## 2022-04-23 NOTE — Addendum Note (Signed)
Addended by: Zandra Abts on: 04/23/2022 02:05 PM   Modules accepted: Orders

## 2022-04-24 DIAGNOSIS — M1990 Unspecified osteoarthritis, unspecified site: Secondary | ICD-10-CM | POA: Diagnosis not present

## 2022-04-24 DIAGNOSIS — R52 Pain, unspecified: Secondary | ICD-10-CM | POA: Diagnosis not present

## 2022-04-28 ENCOUNTER — Telehealth: Payer: Self-pay | Admitting: Allergy and Immunology

## 2022-04-28 DIAGNOSIS — E030 Congenital hypothyroidism with diffuse goiter: Secondary | ICD-10-CM | POA: Diagnosis not present

## 2022-04-28 DIAGNOSIS — E039 Hypothyroidism, unspecified: Secondary | ICD-10-CM | POA: Diagnosis not present

## 2022-04-28 DIAGNOSIS — R5383 Other fatigue: Secondary | ICD-10-CM | POA: Diagnosis not present

## 2022-04-28 NOTE — Telephone Encounter (Signed)
Patients husband called the office wanting to leave a message for Jarrett Soho. He states patient did the nocturnal oximetry Friday night, Saturday morning and they picked it up today (8/8). Spouse mentioned patient was referred to Endo for thyroid issues but they have not heard anything back. He would like an update on this referral.

## 2022-04-29 NOTE — Telephone Encounter (Signed)
Called [facility] and they are scheduling out into January in the [place] office and December in the [place] office.  I called [facility] in [place] and they are scheduling out into December for new patients.  I talked with Dr. Neldon Mc and will proceed with [facility] in [place]. They will reach out to patient to schedule.  I called Broadus John ( [place] husband) and informed him of the referral status.  I told him to ask about getting on a cancellation list when they call to make appointment.  I also encouraged them to reach out to [facility] PCP to see what can be done in the mean time while we wait for referral.  Thora actually went to see her PCP yesterday.  I told them that I will fax results of blood tests to her PCP for PCP to review.  Husband agreed with plan.

## 2022-05-01 DIAGNOSIS — E039 Hypothyroidism, unspecified: Secondary | ICD-10-CM | POA: Diagnosis not present

## 2022-05-01 DIAGNOSIS — R5383 Other fatigue: Secondary | ICD-10-CM | POA: Diagnosis not present

## 2022-05-01 DIAGNOSIS — G4720 Circadian rhythm sleep disorder, unspecified type: Secondary | ICD-10-CM | POA: Diagnosis not present

## 2022-05-06 NOTE — Telephone Encounter (Signed)
Called [name] and informed him that Dr. Neldon Mc received the nocturnal oximetry results and per Dr. Neldon Mc oxygen is ok.  [name] says that Jodi Valdez is very weak and can't sleep.  She is having a lot of drainage down her throat.  She is using the budesonide and says that sometimes it helps and sometimes it does not.  Husband is interested in having patient restart the montelukast...seems to think that she tried it in the past with some improvement of symptoms. Please advise.

## 2022-05-06 NOTE — Telephone Encounter (Signed)
Patients spouse called and is wanting an update on the sleep study results. He states he also has some questions regarding "further treatment".

## 2022-05-07 ENCOUNTER — Encounter: Payer: Self-pay | Admitting: *Deleted

## 2022-05-07 MED ORDER — MONTELUKAST SODIUM 10 MG PO TABS: 10.0000 mg | ORAL_TABLET | Freq: Every day | ORAL | 5 refills | Status: DC

## 2022-05-07 NOTE — Addendum Note (Signed)
Addended by: Zandra Abts on: 05/07/2022 08:53 AM   Modules accepted: Orders

## 2022-05-07 NOTE — Telephone Encounter (Signed)
Left message to call the office.

## 2022-05-07 NOTE — Telephone Encounter (Signed)
Talked with husband and informed him of Dr. Bruna Potter message.  Maish Vaya sent to [facility].

## 2022-05-08 DIAGNOSIS — G4701 Insomnia due to medical condition: Secondary | ICD-10-CM | POA: Diagnosis not present

## 2022-05-08 DIAGNOSIS — L299 Pruritus, unspecified: Secondary | ICD-10-CM | POA: Diagnosis not present

## 2022-05-12 NOTE — Telephone Encounter (Signed)
Patients spouse called patient has an appointment with [facility] tomorrow 08/23 at 11:15am.   Spouse wanted me to tell Jarrett Soho if she could take a look at the pulse oximetry results because he read that patient could qualify for oxygen. He just wants to clarify these results.

## 2022-05-13 ENCOUNTER — Ambulatory Visit: Payer: Medicare PPO | Admitting: Internal Medicine

## 2022-05-13 ENCOUNTER — Encounter: Payer: Self-pay | Admitting: Internal Medicine

## 2022-05-13 ENCOUNTER — Telehealth: Payer: Self-pay | Admitting: Internal Medicine

## 2022-05-13 VITALS — BP 130/72 | HR 64 | Ht 64.0 in | Wt 116.0 lb

## 2022-05-13 DIAGNOSIS — E063 Autoimmune thyroiditis: Secondary | ICD-10-CM

## 2022-05-13 DIAGNOSIS — F321 Major depressive disorder, single episode, moderate: Secondary | ICD-10-CM | POA: Diagnosis not present

## 2022-05-13 MED ORDER — LEVOTHYROXINE SODIUM 100 MCG PO TABS: 100.0000 ug | ORAL_TABLET | Freq: Every day | ORAL | 3 refills | Status: DC

## 2022-05-13 NOTE — Progress Notes (Signed)
Name: Jodi Valdez  MRN/ DOB: [id number], 1936/09/16    Age/ Sex: 86 y.o., female    PCP: Suzan Garibaldi, FNP   Reason for Endocrinology Evaluation: Hashimoto's thyroiditis     Date of Initial Endocrinology Evaluation: 05/13/2022     HPI: [name] is a 86 y.o. female with a past medical history of HTN, cardiomyopathy, GERD, Hx of breast CA (S/P sx, chemo and radiation). The patient presented for initial endocrinology clinic visit on 05/13/2022 for consultative assistance with her Hashimoto's Thyroiditis .   Patient has been diagnosed with hypothyroidism  > 35 yrs ago    She is accompanied by her spouse   She has been noted with elevated anti-TPO antibodies at 509 IU/mL in August 2023   Patient has a diagnosis of long COVID She is crying in the office stating she feels awful , can't sleep at night because she dreams all night  She is on vitamins  She denies constipation or diarrhea  Denies palpitations   She is currently on levothyroxine 88 mcg daily  HISTORY:  Past Medical History:  Past Medical History:  Diagnosis Date   Allergy    Anemia    Asthma    Breast cancer ([employer])    Cancer ([place]) 11/01/2000   left breast cancer   GERD (gastroesophageal reflux disease)    Hyperlipidemia    Hypertension    Personal history of chemotherapy    2002   Personal history of radiation therapy    2002   Polymyalgia [facility])    Past Surgical History:  Past Surgical History:  Procedure Laterality Date   ABDOMINAL HYSTERECTOMY  1975   BREAST SURGERY Left 11/01/2000   masectomy   COLONOSCOPY  2015   CYSTOCELE REPAIR  09/1999   MASTECTOMY Left    2002 with tramflap   RECTOCELE REPAIR  09/1999    Social History:  reports that she has never smoked. She has never used smokeless tobacco. She reports that she does not drink alcohol and does not use drugs. Family History: family history is not on file.   HOME MEDICATIONS: Allergies as of 05/13/2022        Reactions   Propranolol Other (See Comments)   Hallucinations   Gabapentin Other (See Comments)   Hallucinations   Codeine Nausea And Vomiting   Nitrofuran Derivatives Nausea And Vomiting        Medication List        Accurate as of May 13, 2022 12:14 PM. If you have any questions, ask your nurse or doctor.          albuterol 108 (90 Base) MCG/ACT inhaler Commonly known as: VENTOLIN HFA Inhale 2 puffs into the lungs every 6 (six) hours as needed for wheezing or shortness of breath.   alendronate 70 MG tablet Commonly known as: FOSAMAX Take 70 mg by mouth once a week.   ALPRAZolam 0.25 MG tablet Commonly known as: XANAX Take 0.25 mg by mouth 2 (two) times daily.   amLODipine 2.5 MG tablet Commonly known as: NORVASC Take 2.5 mg by mouth daily.   budesonide 0.5 MG/2ML nebulizer solution Commonly known as: PULMICORT Use one vial in nebulizer twice daily as directed.   carvedilol 6.25 MG tablet Commonly known as: COREG Take 6.25 mg by mouth 2 (two) times daily with a meal.   EPINEPHrine 0.3 mg/0.3 mL Soaj injection Commonly known as: EPI-PEN Use as directed for life-threatening allergic reaction. What changed:  how much to  take how to take this when to take this reasons to take this   famotidine 20 MG tablet Commonly known as: PEPCID Take 20 mg by mouth 2 (two) times daily.   furosemide 40 MG tablet Commonly known as: LASIX TAKE 1 TABLET BY MOUTH EVERY DAY   ipratropium-albuterol 0.5-2.5 (3) MG/3ML Soln Commonly known as: DUONEB Take 3 mLs by nebulization every 6 (six) hours as needed. What changed: reasons to take this   levocetirizine 5 MG tablet Commonly known as: XYZAL Take 5 mg by mouth daily.   levothyroxine 100 MCG tablet Commonly known as: SYNTHROID Take 1 tablet (100 mcg total) by mouth daily. What changed:  medication strength how much to take Changed by: Dorita Sciara, MD   losartan 50 MG tablet Commonly known as:  COZAAR Take 50 mg by mouth daily.   megestrol 40 MG/ML suspension Commonly known as: MEGACE Take 400 mg by mouth every morning.   montelukast 10 MG tablet Commonly known as: SINGULAIR Take 1 tablet (10 mg total) by mouth at bedtime.   pantoprazole 40 MG tablet Commonly known as: Protonix Take one tablet by mouth twice daily as directed.   potassium chloride SA 20 MEQ tablet Commonly known as: KLOR-CON M Take 1 tablet (20 mEq total) by mouth daily. What changed: how much to take   predniSONE 1 MG tablet Commonly known as: DELTASONE Take 3 mg by mouth daily.   predniSONE 5 MG tablet Commonly known as: DELTASONE Take 5 mg by mouth daily.   PRESERVISION/LUTEIN PO Take 1 tablet by mouth 2 (two) times daily. Unknown strength   Spacer/Aero-Holding Dorise Bullion As directed What changed:  how much to take how to take this when to take this   traZODone 50 MG tablet Commonly known as: DESYREL Take 50 mg by mouth at bedtime as needed for sleep.   venlafaxine XR 75 MG 24 hr capsule Commonly known as: EFFEXOR-XR Take 75 mg by mouth daily.   Vitamin D3 25 MCG (1000 UT) Caps Take 1,000 Units by mouth 2 (two) times daily.          REVIEW OF SYSTEMS: A comprehensive ROS was conducted with the patient and is negative except as per HPI     OBJECTIVE:  VS: BP 130/72 (BP Location: Left Arm, Patient Position: Sitting, Cuff Size: Small)   Pulse 64   Ht '5\' 4"'$  (1.626 m)   Wt 116 lb (52.6 kg)   SpO2 98%   BMI 19.91 kg/m    Wt Readings from Last 3 Encounters:  05/13/22 116 lb (52.6 kg)  04/04/22 110 lb (49.9 kg)  01/01/22 121 lb 6.4 oz (55.1 kg)     EXAM: General: Pt is tearful in the office   Neck: General: Supple without adenopathy. Thyroid: Thyroid size normal.  No goiter or nodules appreciated.   Lungs: Clear with good BS bilat with no rales, rhonchi, or wheezes  Heart: Auscultation: RRR.  Abdomen: Normoactive bowel sounds, soft, nontender, without masses or  organomegaly palpable  Extremities:  BL LE: No pretibial edema normal ROM and strength.  Mental Status: Judgment, insight: Intact Orientation: Oriented to time, place, and person Memory: Intact for recent and remote events Mood and affect: No depression, anxiety, or agitation     DATA REVIEWED:   04/28/2022 TSH 6.149 uIU/mL   ASSESSMENT/PLAN/RECOMMENDATIONS:   Hashimoto's thyroiditis:   -Patient with major depression, I did explain to the patient that her thyroid abnormality is not severe enough to explain the severity  of her symptoms -Her TSH is slightly elevated -- Pt educated extensively on the correct way to take levothyroxine (first thing in the morning with water, 30 minutes before eating or taking other medications). - Pt encouraged to double dose the following day if she were to miss a dose given long half-life of levothyroxine.  Medications : Stop levothyroxine 88 mcg Start levothyroxine 100 mcg daily  2.  Depression: -This is the major issue and I have strongly encouraged her to follow-up with PCP for antidepressant treatment -She is currently on trazodone for insomnia which is also an antidepressant but she is not comfortable using this -We did discuss the importance of addressing her depression as her thyroid numbers do not explain the severity of her symptoms  Labs in 6 weeks Follow-up in 4 months Signed electronically by: Mack Guise, MD  [facility]  [facility] [address] [place], [place] [address] Phone: [phone] FAX: [phone]   CC: Suzan Garibaldi, [address] [place] [address] Phone: [phone] Fax: [phone]   Return to Endocrinology clinic as below: Future Appointments  Date Time Provider [place]  10/20/2022 11:30 AM Marcell Chavarin, Melanie Crazier, MD [facility] None

## 2022-05-13 NOTE — Telephone Encounter (Signed)
Jodi Valdez,    Please make sure you don't forget to schedule this patient for a lab appointment in 6 weeks

## 2022-05-13 NOTE — Telephone Encounter (Signed)
Please advise 

## 2022-05-13 NOTE — Patient Instructions (Signed)

## 2022-05-14 NOTE — Telephone Encounter (Signed)
Talked with [name] and informed him of Dr. Bruna Potter message.

## 2022-05-14 NOTE — Telephone Encounter (Signed)
Patients spouse called wanting to know if Dr. Neldon Mc has mentioned anything regarding patient possibly qualifying for the oxygen.

## 2022-05-14 NOTE — Telephone Encounter (Signed)
Please inform Jodi Valdez is husband that there does not appear to be an indication for starting any oxygen based upon the results of her nocturnal oximetry study.

## 2022-05-18 ENCOUNTER — Ambulatory Visit: Payer: Medicare PPO | Admitting: Allergy and Immunology

## 2022-05-27 DIAGNOSIS — I10 Essential (primary) hypertension: Secondary | ICD-10-CM | POA: Diagnosis not present

## 2022-05-27 DIAGNOSIS — R059 Cough, unspecified: Secondary | ICD-10-CM | POA: Diagnosis not present

## 2022-05-27 DIAGNOSIS — K219 Gastro-esophageal reflux disease without esophagitis: Secondary | ICD-10-CM | POA: Diagnosis not present

## 2022-05-27 DIAGNOSIS — E785 Hyperlipidemia, unspecified: Secondary | ICD-10-CM | POA: Diagnosis not present

## 2022-05-27 DIAGNOSIS — R0789 Other chest pain: Secondary | ICD-10-CM | POA: Diagnosis not present

## 2022-05-27 DIAGNOSIS — F419 Anxiety disorder, unspecified: Secondary | ICD-10-CM | POA: Diagnosis not present

## 2022-05-27 DIAGNOSIS — Z885 Allergy status to narcotic agent status: Secondary | ICD-10-CM | POA: Diagnosis not present

## 2022-05-27 DIAGNOSIS — R079 Chest pain, unspecified: Secondary | ICD-10-CM | POA: Diagnosis not present

## 2022-05-27 DIAGNOSIS — R000 Tachycardia, unspecified: Secondary | ICD-10-CM | POA: Diagnosis not present

## 2022-05-27 DIAGNOSIS — Z79890 Hormone replacement therapy (postmenopausal): Secondary | ICD-10-CM | POA: Diagnosis not present

## 2022-05-27 DIAGNOSIS — Z79899 Other long term (current) drug therapy: Secondary | ICD-10-CM | POA: Diagnosis not present

## 2022-05-27 DIAGNOSIS — Z7951 Long term (current) use of inhaled steroids: Secondary | ICD-10-CM | POA: Diagnosis not present

## 2022-05-30 DIAGNOSIS — R0789 Other chest pain: Secondary | ICD-10-CM | POA: Diagnosis not present

## 2022-06-01 DIAGNOSIS — R059 Cough, unspecified: Secondary | ICD-10-CM | POA: Diagnosis not present

## 2022-06-01 DIAGNOSIS — I10 Essential (primary) hypertension: Secondary | ICD-10-CM | POA: Diagnosis not present

## 2022-06-05 DIAGNOSIS — G44209 Tension-type headache, unspecified, not intractable: Secondary | ICD-10-CM | POA: Diagnosis not present

## 2022-06-05 DIAGNOSIS — I10 Essential (primary) hypertension: Secondary | ICD-10-CM | POA: Diagnosis not present

## 2022-06-09 ENCOUNTER — Ambulatory Visit: Payer: Medicare PPO | Attending: Cardiology | Admitting: Cardiology

## 2022-06-09 ENCOUNTER — Encounter: Payer: Self-pay | Admitting: Cardiology

## 2022-06-09 VITALS — BP 134/74 | HR 100 | Ht 61.5 in | Wt 116.8 lb

## 2022-06-09 DIAGNOSIS — I255 Ischemic cardiomyopathy: Secondary | ICD-10-CM

## 2022-06-09 DIAGNOSIS — I10 Essential (primary) hypertension: Secondary | ICD-10-CM | POA: Diagnosis not present

## 2022-06-09 DIAGNOSIS — E782 Mixed hyperlipidemia: Secondary | ICD-10-CM | POA: Diagnosis not present

## 2022-06-09 NOTE — Patient Instructions (Signed)
Medication Instructions:  Your physician recommends that you continue on your current medications as directed. Please refer to the Current Medication list given to you today.  *If you need a refill on your cardiac medications before your next appointment, please call your pharmacy*   Lab Work: None Ordered If you have labs (blood work) drawn today and your tests are completely normal, you will receive your results only by: [place] (if you have MyChart) OR A paper copy in the mail If you have any lab test that is abnormal or we need to change your treatment, we will call you to review the results.   Testing/Procedures: None Ordered   Follow-Up: At [facility], you and your health needs are our priority.  As part of our continuing mission to provide you with exceptional heart care, we have created designated Provider Care Teams.  These Care Teams include your primary Cardiologist (physician) and Advanced Practice Providers (APPs -  Physician Assistants and Nurse Practitioners) who all work together to provide you with the care you need, when you need it.  We recommend signing up for the patient portal called "MyChart".  Sign up information is provided on this After Visit Summary.  MyChart is used to connect with patients for Virtual Visits (Telemedicine).  Patients are able to view lab/test results, encounter notes, upcoming appointments, etc.  Non-urgent messages can be sent to your provider as well.   To learn more about what you can do with MyChart, go to [email or web address].    Your next appointment:   5 month(s)  The format for your next appointment:   In Person  Provider:   Jenne Campus, MD    Other Instructions [facility] [address] [place], [place] [address] [phone]

## 2022-06-09 NOTE — Progress Notes (Signed)
Cardiology Office Note:    Date:  06/09/2022   ID:  Jodi Valdez, Jodi Valdez [birth year], MRN [id number]  PCP:  Suzan Garibaldi, [place]  Cardiologist:  Jenne Campus, MD    Referring MD: Suzan Garibaldi, FNP   Chief Complaint  Patient presents with   Chest Pain    Happened on Saturday as she was cleaning, she attempt to reach out for something and because she was unable to reach, she got angry and chest pain began.     History of Present Illness:    Jodi Valdez is a 86 y.o. female  past medical history significant for essential hypertension, breast CA, cardiomyopathy ejection fraction 50 to 55% however in the fall of last year she went to our hospital because of swollen legs, echocardiogram has been repeated ejection fraction 4045%.  She also had a stress test done at that time which showed no evidence of ischemia but potentially old myocardial infarction.  She was also tested for pulmonary emboli which was negative.  Last echocardiogram done in April of this year showed low normal ejection fraction mild mitral valve regurgitation. On Sunday she was cleaning her house she got aggravated with some tools that she was using to get very nervous upset started having some chest tightness.  She relaxed and that sensation went away.  That was the first sensation she had for long time.  Otherwise she think she is doing well.  She denies having any dyspnea on exertion swelling of lower extremities.   Past Medical History:  Diagnosis Date   Allergy    Anemia    Asthma    Breast cancer ([place])    Cancer ([place]) 11/01/2000   left breast cancer   GERD (gastroesophageal reflux disease)    Hyperlipidemia    Hypertension    Personal history of chemotherapy    2002   Personal history of radiation therapy    2002   Polymyalgia [facility])     Past Surgical History:  Procedure Laterality Date   ABDOMINAL HYSTERECTOMY  1975   BREAST SURGERY Left 11/01/2000   masectomy   COLONOSCOPY  2015    CYSTOCELE REPAIR  09/1999   MASTECTOMY Left    2002 with tramflap   RECTOCELE REPAIR  09/1999    Current Medications: Current Meds  Medication Sig   albuterol (VENTOLIN HFA) 108 (90 Base) MCG/ACT inhaler Inhale 2 puffs into the lungs every 6 (six) hours as needed for wheezing or shortness of breath.   alendronate (FOSAMAX) 70 MG tablet Take 70 mg by mouth once a week.   amLODipine (NORVASC) 2.5 MG tablet Take 5 mg by mouth 2 (two) times daily.   budesonide (PULMICORT) 0.5 MG/2ML nebulizer solution Use one vial in nebulizer twice daily as directed. (Patient taking differently: Take 0.5 mg by nebulization See admin instructions. Use one vial in nebulizer twice daily as directed.)   carvedilol (COREG) 6.25 MG tablet Take 6.25 mg by mouth 2 (two) times daily with a meal.   Cholecalciferol (VITAMIN D3) 1000 UNITS CAPS Take 1,000 Units by mouth 2 (two) times daily.   EPINEPHrine 0.3 mg/0.3 mL IJ SOAJ injection Use as directed for life-threatening allergic reaction. (Patient taking differently: Inject 0.3 mg into the muscle as needed for anaphylaxis. Use as directed for life-threatening allergic reaction.)   furosemide (LASIX) 40 MG tablet TAKE 1 TABLET BY MOUTH EVERY DAY (Patient taking differently: Take 20 mg by mouth daily.)   ipratropium-albuterol (DUONEB) 0.5-2.5 (3) MG/3ML SOLN Take 3 mLs  by nebulization every 6 (six) hours as needed. (Patient taking differently: Take 3 mLs by nebulization every 6 (six) hours as needed (sob).)   levocetirizine (XYZAL) 5 MG tablet Take 5 mg by mouth daily.   levothyroxine (SYNTHROID) 100 MCG tablet Take 1 tablet (100 mcg total) by mouth daily.   losartan (COZAAR) 50 MG tablet Take 50 mg by mouth daily.   megestrol (MEGACE) 40 MG/ML suspension Take 400 mg by mouth every morning.   montelukast (SINGULAIR) 10 MG tablet Take 1 tablet (10 mg total) by mouth at bedtime.   Multiple Vitamins-Minerals (PRESERVISION/LUTEIN PO) Take 1 tablet by mouth 2 (two) times daily.  Unknown strength   pantoprazole (PROTONIX) 40 MG tablet Take one tablet by mouth twice daily as directed. (Patient taking differently: Take 40 mg by mouth 2 (two) times daily. Take one tablet by mouth twice daily as directed.)   potassium chloride SA (KLOR-CON) 20 MEQ tablet Take 1 tablet (20 mEq total) by mouth daily. (Patient taking differently: Take 10 mEq by mouth daily.)   predniSONE (DELTASONE) 1 MG tablet Take 3 mg by mouth daily.   predniSONE (DELTASONE) 5 MG tablet Take 5 mg by mouth daily.   Spacer/Aero-Holding Dorise Bullion As directed (Patient taking differently: 1 each by Other route See admin instructions. As directed)   traZODone (DESYREL) 50 MG tablet Take 100 mg by mouth at bedtime as needed for sleep.   venlafaxine XR (EFFEXOR-XR) 75 MG 24 hr capsule Take 75 mg by mouth daily.   [DISCONTINUED] ALPRAZolam (XANAX) 0.25 MG tablet Take 0.25 mg by mouth 2 (two) times daily.   [DISCONTINUED] famotidine (PEPCID) 20 MG tablet Take 20 mg by mouth 2 (two) times daily.     Allergies:   Propranolol, Gabapentin, Codeine, and Nitrofuran derivatives   Social History   Socioeconomic History   Marital status: Married    Spouse name: Not on file   Number of children: 2   Years of education: Not on file   Highest education level: Not on file  Occupational History   Occupation: Retired    Fish farm manager: RETIRED  Tobacco Use   Smoking status: Never   Smokeless tobacco: Never  Vaping Use   Vaping Use: Never used  Substance and Sexual Activity   Alcohol use: No    Alcohol/week: 0.0 standard drinks of alcohol   Drug use: No   Sexual activity: Not on file  Other Topics Concern   Not on file  Social History Narrative   Not on file   Social Determinants of Health   Financial Resource Strain: Low Risk  (08/04/2021)   Overall Financial Resource Strain (CARDIA)    Difficulty of Paying Living Expenses: Not hard at all  Food Insecurity: No Food Insecurity (08/04/2021)   Hunger Vital Sign     Worried About Running Out of Food in the Last Year: Never true    [place] in the Last Year: Never true  Transportation Needs: No Transportation Needs (08/04/2021)   PRAPARE - Hydrologist (Medical): No    Lack of Transportation (Non-Medical): No  Physical Activity: Inactive (08/04/2021)   Exercise Vital Sign    Days of Exercise per Week: 0 days    Minutes of Exercise per Session: 0 min  Stress: Stress Concern Present (08/04/2021)   [place]    Feeling of Stress : To some extent  Social Connections: Moderately Integrated (08/04/2021)   Social  Connection and Isolation Panel [NHANES]    Frequency of Communication with Friends and Family: Twice a week    Frequency of Social Gatherings with Friends and Family: Twice a week    Attends Religious Services: More than 4 times per year    Active Member of [employer] or Organizations: No    Attends Music therapist: Never    Marital Status: Married     Family History: The patient's family history is negative for Colon cancer, Colon polyps, Diabetes, Kidney disease, and Esophageal cancer. ROS:   Please see the history of present illness.    All 14 point review of systems negative except as described per history of present illness  EKGs/Labs/Other Studies Reviewed:      Recent Labs: 06/10/2021: B Natriuretic Peptide 66.6 09/30/2021: NT-Pro BNP 574 04/04/2022: ALT 21; BUN 16; Creatinine, Ser 0.85; Hemoglobin 12.4; Platelets 271; Potassium 4.0; Sodium 141 04/21/2022: TSH 5.170  Recent Lipid Panel No results found for: "CHOL", "TRIG", "HDL", "CHOLHDL", "VLDL", "LDLCALC", "LDLDIRECT"  Physical Exam:    VS:  BP 134/74 (BP Location: Left Arm, Patient Position: Sitting)   Pulse 100   Ht 5' 1.5" (1.562 m)   Wt 116 lb 12.8 oz (53 kg)   SpO2 95%   BMI 21.71 kg/m     Wt Readings from Last 3 Encounters:  06/09/22 116 lb 12.8 oz (53  kg)  05/13/22 116 lb (52.6 kg)  04/04/22 110 lb (49.9 kg)     GEN:  Well nourished, well developed in no acute distress HEENT: Normal NECK: No JVD; No carotid bruits LYMPHATICS: No lymphadenopathy CARDIAC: RRR, no murmurs, no rubs, no gallops RESPIRATORY:  Clear to auscultation without rales, wheezing or rhonchi  ABDOMEN: Soft, non-tender, non-distended MUSCULOSKELETAL:  No edema; No deformity  SKIN: Warm and dry LOWER EXTREMITIES: no swelling NEUROLOGIC:  Alert and oriented x 3 PSYCHIATRIC:  Normal affect   ASSESSMENT:    1. Ischemic cardiomyopathy   2. Essential hypertension   3. Mixed hyperlipidemia    PLAN:    In order of problems listed above:  Chest pain concerning but this is the first episode since that time known provoked by being upset.  I will give her nitroglycerin as needed ask her to take that medication she did have multiple stress test done in the past all were negative.  Therefore, I will simply watch clinically how she does if she will have recurrence of chest pain evaluation for obstructive disease need to be done. Essential hypertension blood presently well controlled I will continue present management. Mixed dyslipidemia I did review her K PN which show me her LDL of 87 HDL 39.  She is reluctant to take any medication for her cholesterol but will continue this discussion Cardiomyopathy last echocardiogram showed normal low normal ejection fraction, she is on Cozaar which I will continue   Medication Adjustments/Labs and Tests Ordered: Current medicines are reviewed at length with the patient today.  Concerns regarding medicines are outlined above.  No orders of the defined types were placed in this encounter.  Medication changes: No orders of the defined types were placed in this encounter.   Signed, Park Liter, MD, [facility] 06/09/2022 3:23 PM    [place]

## 2022-06-11 ENCOUNTER — Other Ambulatory Visit: Payer: Self-pay

## 2022-06-11 MED ORDER — POTASSIUM CHLORIDE CRYS ER 20 MEQ PO TBCR: 20.0000 meq | EXTENDED_RELEASE_TABLET | Freq: Every day | ORAL | 3 refills | Status: DC

## 2022-06-15 NOTE — Telephone Encounter (Signed)
Husband states he is very concerned because patient is still having a very hard time breathing. He is wanting to know if Dr. Neldon Mc would put in an order for her to get oxygen.

## 2022-06-15 NOTE — Telephone Encounter (Signed)
Informed patient's husband of Dr. Bruna Potter message. Scheduled patient for a follow-up next week with Dr. Neldon Mc.

## 2022-06-16 DIAGNOSIS — J9801 Acute bronchospasm: Secondary | ICD-10-CM | POA: Diagnosis not present

## 2022-06-16 DIAGNOSIS — E860 Dehydration: Secondary | ICD-10-CM | POA: Diagnosis not present

## 2022-06-22 ENCOUNTER — Ambulatory Visit: Payer: Medicare PPO | Admitting: Allergy and Immunology

## 2022-06-30 ENCOUNTER — Other Ambulatory Visit (INDEPENDENT_AMBULATORY_CARE_PROVIDER_SITE_OTHER): Payer: Medicare PPO

## 2022-06-30 DIAGNOSIS — E063 Autoimmune thyroiditis: Secondary | ICD-10-CM | POA: Diagnosis not present

## 2022-06-30 LAB — TSH: TSH: 0.1 u[IU]/mL — ABNORMAL LOW (ref 0.35–5.50)

## 2022-06-30 LAB — T4, FREE: Free T4: 1.69 ng/dL — ABNORMAL HIGH (ref 0.60–1.60)

## 2022-07-01 ENCOUNTER — Telehealth: Payer: Self-pay | Admitting: Internal Medicine

## 2022-07-01 NOTE — Telephone Encounter (Signed)
Patient advised and verbalized understanding 

## 2022-07-01 NOTE — Telephone Encounter (Signed)
Please let the patient know that the current dose of levothyroxine 100 mcg daily is too much for her    I would suggest taking half a tablet on Sundays from now on, but take 1 tablet Monday through Saturday   Thanks

## 2022-07-09 ENCOUNTER — Telehealth: Payer: Self-pay | Admitting: Cardiology

## 2022-07-09 NOTE — Telephone Encounter (Signed)
Spoke with pt. She stated that her leg is swelling from her ankle to her knee for the past 2 days. Denied pain or redness. BP & HR with no changes. No wt gain or increase SOB. Lasix is '20mg'$  daily.

## 2022-07-09 NOTE — Telephone Encounter (Signed)
  Pt c/o swelling: STAT is pt has developed SOB within 24 hours  If swelling, where is the swelling located? Right leg, on knee to her ankle   How much weight have you gained and in what time span? None   Have you gained 3 pounds in a day or 5 pounds in a week? None  Do you have a log of your daily weights (if so, list)?   Are you currently taking a fluid pill? Yes   Are you currently SOB? Yes   Have you traveled recently? No    Pt's husband said, pt has swelling in her right leg and its not going down despite taking fluid pills

## 2022-07-10 NOTE — Telephone Encounter (Signed)
Spoke with Spouse per DPR- advised per Dr. Wendy Poet note to increase Lasix to '40mg'$  daily for 2-3 days to see if this will help. Spouse stated that she had her leg elevated yesterday and this helped the swelling. Advised to call with any problems or concerns. Spouse agreed and verbalized understanding.

## 2022-07-16 DIAGNOSIS — E099 Drug or chemical induced diabetes mellitus without complications: Secondary | ICD-10-CM | POA: Diagnosis not present

## 2022-07-16 DIAGNOSIS — F330 Major depressive disorder, recurrent, mild: Secondary | ICD-10-CM | POA: Diagnosis not present

## 2022-07-16 DIAGNOSIS — R41840 Attention and concentration deficit: Secondary | ICD-10-CM | POA: Diagnosis not present

## 2022-07-16 DIAGNOSIS — N1831 Chronic kidney disease, stage 3a: Secondary | ICD-10-CM | POA: Diagnosis not present

## 2022-07-16 DIAGNOSIS — I5020 Unspecified systolic (congestive) heart failure: Secondary | ICD-10-CM | POA: Diagnosis not present

## 2022-07-16 DIAGNOSIS — T380X5A Adverse effect of glucocorticoids and synthetic analogues, initial encounter: Secondary | ICD-10-CM | POA: Diagnosis not present

## 2022-07-16 DIAGNOSIS — I10 Essential (primary) hypertension: Secondary | ICD-10-CM | POA: Diagnosis not present

## 2022-07-16 DIAGNOSIS — E039 Hypothyroidism, unspecified: Secondary | ICD-10-CM | POA: Diagnosis not present

## 2022-07-16 DIAGNOSIS — E559 Vitamin D deficiency, unspecified: Secondary | ICD-10-CM | POA: Diagnosis not present

## 2022-07-17 ENCOUNTER — Telehealth: Payer: Self-pay | Admitting: Cardiology

## 2022-07-17 ENCOUNTER — Telehealth: Payer: Self-pay

## 2022-07-17 MED ORDER — ISOSORBIDE MONONITRATE ER 30 MG PO TB24: 30.0000 mg | ORAL_TABLET | Freq: Every day | ORAL | 3 refills | Status: DC

## 2022-07-17 NOTE — Telephone Encounter (Signed)
Spoke with Spouse per- DPR-. He stated that the pt had chest pain this morning and had to use 2 Nitro. After 2 Nitros the pain subsided. Spouse was wanting to know what she should. Consulted Dr. Agustin Cree. He stated that he would have her try Imdur '30mg'$  q d to see if this will help. Sent to [facility] per pt request.

## 2022-07-17 NOTE — Telephone Encounter (Signed)
Pt c/o of Chest Pain: STAT if CP now or developed within 24 hours  1. Are you having CP right now? No, this morning   2. Are you experiencing any other symptoms (ex. SOB, nausea, vomiting, sweating)? NO   3. How long have you been experiencing CP?  Last quite a while and it scared her   4. Is your CP continuous or coming and going? Coming and going  5. Have you taken Nitroglycerin? Yes, she took two which brought her some relief  ?

## 2022-07-21 NOTE — Telephone Encounter (Signed)
Pt c/o medication issue:  1. Name of Medication:  isosorbide mononitrate (IMDUR) 30 MG 24 hr tablet  2. How are you currently taking this medication (dosage and times per day)? As prescribed  3. Are you having a reaction (difficulty breathing--STAT)? No   4. What is your medication issue? Patient's husband is calling to give Lattie Haw an update on the patient starting this medication due to her CP. He reports it has been working and seems to have improved her breathing as well.

## 2022-07-22 ENCOUNTER — Telehealth: Payer: Self-pay | Admitting: Cardiology

## 2022-07-22 NOTE — Telephone Encounter (Signed)
Spouse called to say Imdur has helped pts chest pain. Dr. Agustin Cree recommended she continue the medication. She reported right ankle pain. No redness or swelling. Encouraged her to follow up with her PCP on the ankle pain. Spouse agreed and verbalized understanding. He had no other questions.

## 2022-07-22 NOTE — Telephone Encounter (Signed)
Patient would like to speak directly with RN Deedee today if possible.

## 2022-07-27 ENCOUNTER — Telehealth: Payer: Self-pay | Admitting: Cardiology

## 2022-07-27 NOTE — Telephone Encounter (Signed)
*  STAT* If patient is at the pharmacy, call can be transferred to refill team.   1. Which medications need to be refilled? (please list name of each medication and dose if known) Nitroglycerin .4 mg  2. Which pharmacy/location (including street and city if local pharmacy) is medication to be sent to? [place], [place]   3. Do they need a 30 day or 90 day supply? 30 day supply

## 2022-07-30 MED ORDER — NITROGLYCERIN 0.4 MG SL SUBL: SUBLINGUAL_TABLET | SUBLINGUAL | 2 refills | Status: DC

## 2022-07-30 NOTE — Telephone Encounter (Signed)
Medication sent.

## 2022-09-29 DIAGNOSIS — J449 Chronic obstructive pulmonary disease, unspecified: Secondary | ICD-10-CM | POA: Diagnosis not present

## 2022-09-30 ENCOUNTER — Ambulatory Visit: Payer: Medicare PPO | Admitting: Internal Medicine

## 2022-10-02 ENCOUNTER — Telehealth: Payer: Self-pay | Admitting: Cardiology

## 2022-10-02 MED ORDER — ISOSORBIDE MONONITRATE ER 30 MG PO TB24: 30.0000 mg | ORAL_TABLET | Freq: Every day | ORAL | 0 refills | Status: DC

## 2022-10-02 NOTE — Telephone Encounter (Signed)
Pt is unable to hear me when I call. IF pt returns call we do not have samples of the medications he is requesting

## 2022-10-02 NOTE — Telephone Encounter (Signed)
*  STAT* If patient is at the pharmacy, call can be transferred to refill team.   1. Which medications need to be refilled? (please list name of each medication and dose if known)   isosorbide mononitrate (IMDUR) 30 MG 24 hr tablet    2. Which pharmacy/location (including street and city if local pharmacy) is medication to be sent to?  [id number] [place]   3. Do they need a 30 day or 90 day supply? 2 week supply  Pt needs 2 weeks supply while waiting from center to send her refills

## 2022-10-02 NOTE — Telephone Encounter (Signed)
  Patient calling the office for samples of medication:   1.  What medication and dosage are you requesting samples for?   isosorbide mononitrate (IMDUR) 30 MG 24 hr tablet    2.  Are you currently out of this medication?   On 01/16  Pt's spouse said, pt will be ran out of meds on 01/16 and pharmacy couldn't refill it until 10/12/22. He called his insurance and was advised to call doctor to request for samples so pt wont have to missed her dosage. He said to call him back to [phone]

## 2022-10-06 ENCOUNTER — Ambulatory Visit (INDEPENDENT_AMBULATORY_CARE_PROVIDER_SITE_OTHER): Payer: Medicare PPO | Admitting: Orthopaedic Surgery

## 2022-10-06 ENCOUNTER — Ambulatory Visit (INDEPENDENT_AMBULATORY_CARE_PROVIDER_SITE_OTHER): Payer: Medicare PPO

## 2022-10-06 DIAGNOSIS — M25561 Pain in right knee: Secondary | ICD-10-CM | POA: Diagnosis not present

## 2022-10-06 NOTE — Progress Notes (Signed)
Office Visit Note   Patient: Jodi Valdez           Date of Birth: [birth year]           MRN: [id number] Visit Date: 10/06/2022              Requested by: Suzan Garibaldi, FNP [address],  [place] [address] PCP: Suzan Garibaldi, FNP   Assessment & Plan: Visit Diagnoses:  1. Right knee pain, unspecified chronicity     Plan: Right knee injection performed which she tolerated well.  She will follow-up if she is having ongoing symptoms.  Follow-Up Instructions: No follow-ups on file.   Orders:  Orders Placed This Encounter  Procedures   XR Knee 1-2 Views Right   No orders of the defined types were placed in this encounter.     Procedures: Large Joint Inj: R knee on 10/07/2022 7:34 PM Indications: pain and joint swelling Details: 22 G 1.5 in needle, anterolateral approach  Arthrogram: No  Medications: 40 mg methylPREDNISolone acetate 40 MG/ML; 0.5 mL lidocaine 1 %; 4 mL bupivacaine 0.25 % Outcome: tolerated well, no immediate complications Procedure, treatment alternatives, risks and benefits explained, specific risks discussed. Consent was given by the patient. Immediately prior to procedure a time out was called to verify the correct patient, procedure, equipment, support staff and site/side marked as required. Patient was prepped and draped in the usual sterile fashion.       Clinical Data: No additional findings.   Subjective: Chief Complaint  Patient presents with   Right Knee - Pain    HPI 87 year old female in a wheelchair with chronic right knee pain.  She was seen in the emergency room history of falls swelling in her knee ambulatory with cane she states her pain is constant she has had problems with severe headaches and has long haul COVID as well as history of shingles.  Right knee is painful stiff and difficult for her to move.  She is requesting an injection of her right knee.  Review of Systems all other systems noncontributory to  HPI.   Objective: Vital Signs: There were no vitals taken for this visit.  Physical Exam Constitutional:      Appearance: She is well-developed.  HENT:     Head: Normocephalic.     Right Ear: External ear normal.     Left Ear: External ear normal. There is no impacted cerumen.  Eyes:     Pupils: Pupils are equal, round, and reactive to light.  Neck:     Thyroid: No thyromegaly.     Trachea: No tracheal deviation.  Cardiovascular:     Rate and Rhythm: Normal rate.  Pulmonary:     Effort: Pulmonary effort is normal.  Abdominal:     Palpations: Abdomen is soft.  Musculoskeletal:     Cervical back: No rigidity.  Skin:    General: Skin is warm and dry.  Neurological:     Mental Status: She is alert and oriented to person, place, and time.  Psychiatric:        Behavior: Behavior normal.     Ortho Exam right knee tenderness crepitus with knee range of motion.  No pain with hip range of motion no trochanteric tenderness collateral ligaments crucial ligament is normal medial joint line tenderness.  Specialty Comments:  No specialty comments available.  Imaging: No results found.   PMFS History: Patient Active Problem List   Diagnosis Date Noted   Cardiomyopathy ([place])  ejection fraction 40 to 45% in October 2022 based on echo 08/08/2021   Polymyalgia ([place]) 08/06/2021   Personal history of radiation therapy 08/06/2021   Personal history of chemotherapy 08/06/2021   Hyperlipidemia 08/06/2021   Breast cancer ([place]) 08/06/2021   Asthma 08/06/2021   Allergy 08/06/2021   Internal hemorrhoids with complication 94/85/4627   Hematochezia 06/17/2020   Heme positive stool 06/04/2020   Anemia 06/04/2020   Gastroesophageal reflux disease 06/04/2020   Abdominal pain, epigastric 06/04/2020   NSAID long-term use 06/04/2020   Swelling of both lower extremities 05/23/2020   COVID-19 long hauler manifesting chronic dyspnea 02/21/2018   Abdominal pain, lower 10/07/2014   Abnormal  findings on radiological examination of gastrointestinal tract 07/17/2014   ABDOMINAL PAIN -GENERALIZED 12/31/2009   HYPERTHYROIDISM 12/09/2009   Essential hypertension 12/09/2009   Constipation 12/09/2009   ABDOMINAL PAIN RIGHT LOWER QUADRANT 12/09/2009   ABDOMINAL PAIN, LEFT LOWER QUADRANT 12/09/2009   PERSONAL HX BREAST CANCER 12/09/2009   Cancer ([place]) 11/01/2000   Past Medical History:  Diagnosis Date   Allergy    Anemia    Asthma    Breast cancer ([place])    Cancer ([place]) 11/01/2000   left breast cancer   GERD (gastroesophageal reflux disease)    Hyperlipidemia    Hypertension    Personal history of chemotherapy    2002   Personal history of radiation therapy    2002   Polymyalgia ([place])     Family History  Problem Relation Age of Onset   Colon cancer Neg Hx    Colon polyps Neg Hx    Diabetes Neg Hx    Kidney disease Neg Hx    Esophageal cancer Neg Hx     Past Surgical History:  Procedure Laterality Date   ABDOMINAL HYSTERECTOMY  1975   BREAST SURGERY Left 11/01/2000   masectomy   COLONOSCOPY  2015   CYSTOCELE REPAIR  09/1999   MASTECTOMY Left    2002 with tramflap   RECTOCELE REPAIR  09/1999   Social History   Occupational History   Occupation: Retired    Fish farm manager: RETIRED  Tobacco Use   Smoking status: Never   Smokeless tobacco: Never  Vaping Use   Vaping Use: Never used  Substance and Sexual Activity   Alcohol use: No    Alcohol/week: 0.0 standard drinks of alcohol   Drug use: No   Sexual activity: Not on file

## 2022-10-07 ENCOUNTER — Other Ambulatory Visit: Payer: Self-pay | Admitting: Cardiology

## 2022-10-07 DIAGNOSIS — M25561 Pain in right knee: Secondary | ICD-10-CM | POA: Diagnosis not present

## 2022-10-07 MED ORDER — METHYLPREDNISOLONE ACETATE 40 MG/ML IJ SUSP
40.0000 mg | INTRAMUSCULAR | Status: AC | PRN
  Administered 2022-10-07: 40 mg via INTRA_ARTICULAR

## 2022-10-07 MED ORDER — BUPIVACAINE HCL 0.25 % IJ SOLN
4.0000 mL | INTRAMUSCULAR | Status: AC | PRN
  Administered 2022-10-07: 4 mL via INTRA_ARTICULAR

## 2022-10-07 MED ORDER — LIDOCAINE HCL 1 % IJ SOLN
0.5000 mL | INTRAMUSCULAR | Status: AC | PRN
  Administered 2022-10-07: .5 mL

## 2022-10-13 ENCOUNTER — Other Ambulatory Visit: Payer: Self-pay | Admitting: Allergy and Immunology

## 2022-10-19 DIAGNOSIS — I5020 Unspecified systolic (congestive) heart failure: Secondary | ICD-10-CM | POA: Diagnosis not present

## 2022-10-19 DIAGNOSIS — Z79899 Other long term (current) drug therapy: Secondary | ICD-10-CM | POA: Diagnosis not present

## 2022-10-19 DIAGNOSIS — E099 Drug or chemical induced diabetes mellitus without complications: Secondary | ICD-10-CM | POA: Diagnosis not present

## 2022-10-19 DIAGNOSIS — F330 Major depressive disorder, recurrent, mild: Secondary | ICD-10-CM | POA: Diagnosis not present

## 2022-10-19 DIAGNOSIS — E039 Hypothyroidism, unspecified: Secondary | ICD-10-CM | POA: Diagnosis not present

## 2022-10-19 DIAGNOSIS — G4700 Insomnia, unspecified: Secondary | ICD-10-CM | POA: Diagnosis not present

## 2022-10-19 DIAGNOSIS — N1831 Chronic kidney disease, stage 3a: Secondary | ICD-10-CM | POA: Diagnosis not present

## 2022-10-19 DIAGNOSIS — T380X5A Adverse effect of glucocorticoids and synthetic analogues, initial encounter: Secondary | ICD-10-CM | POA: Diagnosis not present

## 2022-10-19 DIAGNOSIS — I10 Essential (primary) hypertension: Secondary | ICD-10-CM | POA: Diagnosis not present

## 2022-10-19 DIAGNOSIS — E559 Vitamin D deficiency, unspecified: Secondary | ICD-10-CM | POA: Diagnosis not present

## 2022-10-20 ENCOUNTER — Encounter: Payer: Self-pay | Admitting: Internal Medicine

## 2022-10-20 ENCOUNTER — Ambulatory Visit: Payer: Medicare PPO | Admitting: Internal Medicine

## 2022-10-20 ENCOUNTER — Other Ambulatory Visit: Payer: Self-pay | Admitting: Family Medicine

## 2022-10-20 VITALS — BP 122/70 | HR 100 | Ht 61.5 in | Wt 124.0 lb

## 2022-10-20 DIAGNOSIS — E063 Autoimmune thyroiditis: Secondary | ICD-10-CM | POA: Diagnosis not present

## 2022-10-20 DIAGNOSIS — N644 Mastodynia: Secondary | ICD-10-CM

## 2022-10-20 LAB — T4, FREE: Free T4: 1.79 ng/dL — ABNORMAL HIGH (ref 0.60–1.60)

## 2022-10-20 LAB — TSH: TSH: 0.36 u[IU]/mL (ref 0.35–5.50)

## 2022-10-20 NOTE — Patient Instructions (Signed)

## 2022-10-20 NOTE — Progress Notes (Unsigned)
Name: Jodi Valdez  MRN/ DOB: [id number], 1935-12-27    Age/ Sex: 87 y.o., female    PCP: Suzan Garibaldi, FNP   Reason for Endocrinology Evaluation: Hashimoto's thyroiditis     Date of Initial Endocrinology Evaluation: 10/20/2022     HPI: Jodi Valdez is a 87 y.o. female with a past medical history of HTN, cardiomyopathy, GERD, Hx of breast CA (S/P sx, chemo and radiation). The patient presented for initial endocrinology clinic visit on 10/20/2022 for consultative assistance with her Hashimoto's Thyroiditis .   Patient has been diagnosed with hypothyroidism  > 35 yrs ago    She is accompanied by her spouse   She has been noted with elevated anti-TPO antibodies at 509 IU/mL in August 2023   SUBJECTIVE:    Today (10/20/22):  Jodi Valdez is here for a follow up on Hashimoto's thyroiditis. She is accompanied by her spouse today.   She was seen by Ortho for right knee pain  She was seen by cardiology 05/2022 for chest pain and ischemic cardiomyopathy, she was given nitro   She has been noted with weight gain  She has chronic  SOB  Patient has a diagnosis of long COVID Has chronic  diarrhea  Denies palpitations  Denies tremors       She is currently on levothyroxine 88 mcg, half a tab on Sunday and 1 tab daily   HISTORY:  Past Medical History:  Past Medical History:  Diagnosis Date   Allergy    Anemia    Asthma    Breast cancer ([place])    Cancer ([place]) 11/01/2000   left breast cancer   GERD (gastroesophageal reflux disease)    Hyperlipidemia    Hypertension    Personal history of chemotherapy    2002   Personal history of radiation therapy    2002   Polymyalgia [facility])    Past Surgical History:  Past Surgical History:  Procedure Laterality Date   ABDOMINAL HYSTERECTOMY  1975   BREAST SURGERY Left 11/01/2000   masectomy   COLONOSCOPY  2015   CYSTOCELE REPAIR  09/1999   MASTECTOMY Left    2002 with tramflap   RECTOCELE REPAIR  09/1999     Social History:  reports that she has never smoked. She has never used smokeless tobacco. She reports that she does not drink alcohol and does not use drugs. Family History: family history is not on file.   HOME MEDICATIONS: Allergies as of 10/20/2022       Reactions   Propranolol Other (See Comments)   Hallucinations   Gabapentin Other (See Comments)   Hallucinations   Codeine Nausea And Vomiting   Nitrofuran Derivatives Nausea And Vomiting        Medication List        Accurate as of October 20, 2022  7:34 AM. If you have any questions, ask your nurse or doctor.          albuterol 108 (90 Base) MCG/ACT inhaler Commonly known as: VENTOLIN HFA Inhale 2 puffs into the lungs every 6 (six) hours as needed for wheezing or shortness of breath.   alendronate 70 MG tablet Commonly known as: FOSAMAX Take 70 mg by mouth once a week.   amLODipine 2.5 MG tablet Commonly known as: NORVASC Take 5 mg by mouth 2 (two) times daily.   budesonide 0.5 MG/2ML nebulizer solution Commonly known as: PULMICORT Use one vial in nebulizer twice daily as directed. What changed:  how  much to take how to take this when to take this   carvedilol 6.25 MG tablet Commonly known as: COREG Take 6.25 mg by mouth 2 (two) times daily with a meal.   EPINEPHrine 0.3 mg/0.3 mL Soaj injection Commonly known as: EPI-PEN Use as directed for life-threatening allergic reaction. What changed:  how much to take how to take this when to take this reasons to take this   furosemide 40 MG tablet Commonly known as: LASIX TAKE 1 TABLET BY MOUTH EVERY DAY What changed: how much to take   ipratropium-albuterol 0.5-2.5 (3) MG/3ML Soln Commonly known as: DUONEB Take 3 mLs by nebulization every 6 (six) hours as needed. What changed: reasons to take this   isosorbide mononitrate 30 MG 24 hr tablet Commonly known as: IMDUR Take 1 tablet (30 mg total) by mouth daily.   levocetirizine 5 MG  tablet Commonly known as: XYZAL Take 5 mg by mouth daily.   levothyroxine 100 MCG tablet Commonly known as: SYNTHROID Take 1 tablet (100 mcg total) by mouth daily.   losartan 50 MG tablet Commonly known as: COZAAR Take 50 mg by mouth daily.   megestrol 40 MG/ML suspension Commonly known as: MEGACE Take 400 mg by mouth every morning.   montelukast 10 MG tablet Commonly known as: SINGULAIR Take 1 tablet (10 mg total) by mouth at bedtime.   nitroGLYCERIN 0.4 MG SL tablet Commonly known as: NITROSTAT Place 1 tablet (0.4 mg total) under the tongue every 5 (five) minutes as needed for chest pain. MAX 3 DOSES IN 15 MINUTES. IF STILL PAIN CALL 911   pantoprazole 40 MG tablet Commonly known as: Protonix Take one tablet by mouth twice daily as directed. What changed:  how much to take how to take this when to take this   potassium chloride SA 20 MEQ tablet Commonly known as: KLOR-CON M Take 1 tablet (20 mEq total) by mouth daily.   predniSONE 1 MG tablet Commonly known as: DELTASONE Take 3 mg by mouth daily.   predniSONE 5 MG tablet Commonly known as: DELTASONE Take 5 mg by mouth daily.   PRESERVISION/LUTEIN PO Take 1 tablet by mouth 2 (two) times daily. Unknown strength   Spacer/Aero-Holding Dorise Bullion As directed What changed:  how much to take how to take this when to take this   traZODone 50 MG tablet Commonly known as: DESYREL Take 100 mg by mouth at bedtime as needed for sleep.   venlafaxine XR 75 MG 24 hr capsule Commonly known as: EFFEXOR-XR Take 75 mg by mouth daily.   Vitamin D3 25 MCG (1000 UT) Caps Take 1,000 Units by mouth 2 (two) times daily.          REVIEW OF SYSTEMS: A comprehensive ROS was conducted with the patient and is negative except as per HPI     OBJECTIVE:  VS: There were no vitals taken for this visit.   Wt Readings from Last 3 Encounters:  06/09/22 116 lb 12.8 oz (53 kg)  05/13/22 116 lb (52.6 kg)  04/04/22 110 lb  (49.9 kg)     EXAM: General: Jodi Valdez is tearful in the office   Neck: General: Supple without adenopathy. Thyroid: Thyroid size normal.  No goiter or nodules appreciated.   Lungs: Clear with good BS bilat with no rales, rhonchi, or wheezes  Heart: Auscultation: RRR.  Abdomen: Normoactive bowel sounds, soft, nontender, without masses or organomegaly palpable  Extremities:  BL LE: No pretibial edema normal ROM and strength.  Mental  Status: Judgment, insight: Intact Orientation: Oriented to time, place, and person Memory: Intact for recent and remote events Mood and affect: No depression, anxiety, or agitation     DATA REVIEWED:   04/28/2022 TSH 6.149 uIU/mL   ASSESSMENT/PLAN/RECOMMENDATIONS:   Hashimoto's thyroiditis:   -Patient with major depression, I did explain to the patient that her thyroid abnormality is not severe enough to explain the severity of her symptoms -Her TSH is slightly elevated -- Jodi Valdez educated extensively on the correct way to take levothyroxine (first thing in the morning with water, 30 minutes before eating or taking other medications). - Jodi Valdez encouraged to double dose the following day if she were to miss a dose given long half-life of levothyroxine.  Medications : Stop levothyroxine 88 mcg Start levothyroxine 100 mcg daily  2.  Depression: -This is the major issue and I have strongly encouraged her to follow-up with PCP for antidepressant treatment -She is currently on trazodone for insomnia which is also an antidepressant but she is not comfortable using this -We did discuss the importance of addressing her depression as her thyroid numbers do not explain the severity of her symptoms  Labs in 6 weeks Follow-up in 4 months Signed electronically by: Mack Guise, MD  [facility]  [address] [address] [place], [place] [address] Phone: [phone] FAX: [phone]   CC: Suzan Garibaldi, [place]  [place] [address] Phone: [phone] Fax: [phone]   Return to Endocrinology clinic as below: Future Appointments  Date Time Provider [place]  10/20/2022 11:30 AM Kripa Foskey, Melanie Crazier, MD [facility] None  10/22/2022  2:15 PM Margaretha Seeds, MD [facility]  11/09/2022  2:20 PM Park Liter, MD [facility] None

## 2022-10-21 MED ORDER — LEVOTHYROXINE SODIUM 88 MCG PO TABS: 88.0000 ug | ORAL_TABLET | Freq: Every day | ORAL | 3 refills | Status: DC

## 2022-10-22 ENCOUNTER — Encounter (HOSPITAL_BASED_OUTPATIENT_CLINIC_OR_DEPARTMENT_OTHER): Payer: Self-pay | Admitting: Pulmonary Disease

## 2022-10-22 ENCOUNTER — Ambulatory Visit (INDEPENDENT_AMBULATORY_CARE_PROVIDER_SITE_OTHER): Payer: Medicare PPO

## 2022-10-22 ENCOUNTER — Ambulatory Visit (INDEPENDENT_AMBULATORY_CARE_PROVIDER_SITE_OTHER): Payer: Medicare PPO | Admitting: Pulmonary Disease

## 2022-10-22 VITALS — BP 110/50 | HR 92 | Ht 61.5 in | Wt 123.0 lb

## 2022-10-22 DIAGNOSIS — Z0389 Encounter for observation for other suspected diseases and conditions ruled out: Secondary | ICD-10-CM | POA: Diagnosis not present

## 2022-10-22 DIAGNOSIS — J9611 Chronic respiratory failure with hypoxia: Secondary | ICD-10-CM

## 2022-10-22 NOTE — Patient Instructions (Addendum)
[place] with shortness of breath --CONTINUE Duonebs (nebulizer) ONCE a day and AS NEEDED --CONTINUE Pulmicort (nebulizer) TWICE a day  Acute vs chronic hypoxemic respiratory failure --ORDER CXR. Will call with results --START wearing 2L oxygen with activity and sleep  Follow-up with me in 3 months

## 2022-10-22 NOTE — Progress Notes (Signed)
Subjective:   PATIENT ID: Jodi Valdez GENDER: female DOB: 07-10-1936, MRN: [id number]   HPI  Chief Complaint  Patient presents with   Follow-up    Trouble getting a deep breath     Reason for Visit: Follow-up  Jodi Valdez is a 87 year old year old never smoker with COVID-19 long hauler, allergic rhinitis, LPR, hx breast cancer s/p chemoradiation and GERD who presents for follow-up  Synopsis: Began having shortness of breath one year. Not associated with cough or wheezing. Around this time, she was admitted for pneumonia. Did not need oxygen on discharge.  She will have symptoms at least once a week that limit her from doing housework, caring for her plants and three little dogs. At baseline she reports being active. On days she is more symptomatic she feels she cannot do anything and often wakes up feeling like she is having a bad day and has difficulty taking breath. Denies chest pain or tightness. No clear correlation with extremes in weather or humidity, pet exposure. Has tried albuterol for 2-3 weeks daily but did not have any relief. She reports her COVID-19 antibody tested positive and states she lost her sense of taste around May 2022.  08/20/21 She continues shortness of breath with exertion. She has tried Librarian, academic without any relief. Exertion will worsen it. She previously was active in the yard and now feels like she is sitting most of the time. She is able to walk short distances. She previously had edema in her legs but that has improved with lasix, managed by her cardiologist. She had been hospitalized at [facility] in October.  10/27/21 Since our last visit, she reports chronic fatigue, shortness of breath, loss of appetite, poor difficulty concentrating. It has been over a year since her COVID diagnosis. She has tried albuterol three times a day for a month before stopping. Sometimes in evening her breathing may clear up briefly. Denies coughing or  wheezing.  11/24/21 She presents with her husband who provides additional history.  Since our last visit she was started on Symbicort and Duonebs.  Feels this has improved her shortness of breath and she is not using her albuterol as often denies coughing or wheezing.  She has good days and bad days which is an improvement compared to before when she was mostly having bad days. This week she walked the dog and able to tolerate it.  She is looking forward to warmer days so that she can increase her activity level.  Her appetite remains poor but she is drinking boost and taking vitamins  10/22/22 Since our last visit she is feeling more short of breath. No wheezing or coughing. Last month she and her husband had a respiratory viral infection. Her breathing seems to have worsened with her heart issues. Has not left the house in months. Not currently on Symbicort. Uses nebulizer twice a day. Continues daily lasix with improved lower extremity edema.   Social History: Never smoker No child respiratory infections or diagnosis  Past Medical History:  Diagnosis Date   Allergy    Anemia    Asthma    Breast cancer ([place])    Cancer ([place]) 11/01/2000   left breast cancer   GERD (gastroesophageal reflux disease)    Hyperlipidemia    Hypertension    Personal history of chemotherapy    2002   Personal history of radiation therapy    2002   Polymyalgia [facility])      Family History  Problem Relation Age of Onset   Colon cancer Neg Hx    Colon polyps Neg Hx    Diabetes Neg Hx    Kidney disease Neg Hx    Esophageal cancer Neg Hx      Social History   Occupational History   Occupation: Retired    Fish farm manager: RETIRED  Tobacco Use   Smoking status: Never   Smokeless tobacco: Never  Vaping Use   Vaping Use: Never used  Substance and Sexual Activity   Alcohol use: No    Alcohol/week: 0.0 standard drinks of alcohol   Drug use: No   Sexual activity: Not on file    Allergies  Allergen Reactions    Propranolol Other (See Comments)    Hallucinations    Gabapentin Other (See Comments)    Hallucinations   Codeine Nausea And Vomiting   Nitrofuran Derivatives Nausea And Vomiting     Outpatient Medications Prior to Visit  Medication Sig Dispense Refill   albuterol (VENTOLIN HFA) 108 (90 Base) MCG/ACT inhaler Inhale 2 puffs into the lungs every 6 (six) hours as needed for wheezing or shortness of breath. 8 g 2   alendronate (FOSAMAX) 70 MG tablet Take 70 mg by mouth once a week.     amLODipine (NORVASC) 2.5 MG tablet Take 5 mg by mouth 2 (two) times daily.     budesonide (PULMICORT) 0.5 MG/2ML nebulizer solution Use one vial in nebulizer twice daily as directed. (Patient taking differently: Take 0.5 mg by nebulization See admin instructions. Use one vial in nebulizer twice daily as directed.) 120 mL 5   carvedilol (COREG) 6.25 MG tablet Take 6.25 mg by mouth 2 (two) times daily with a meal.     Cholecalciferol (VITAMIN D3) 1000 UNITS CAPS Take 1,000 Units by mouth 2 (two) times daily.     EPINEPHrine 0.3 mg/0.3 mL IJ SOAJ injection Use as directed for life-threatening allergic reaction. (Patient taking differently: Inject 0.3 mg into the muscle as needed for anaphylaxis. Use as directed for life-threatening allergic reaction.) 2 Device 3   ipratropium-albuterol (DUONEB) 0.5-2.5 (3) MG/3ML SOLN Take 3 mLs by nebulization every 6 (six) hours as needed. (Patient taking differently: Take 3 mLs by nebulization every 6 (six) hours as needed (sob).) 360 mL 5   isosorbide mononitrate (IMDUR) 30 MG 24 hr tablet Take 1 tablet (30 mg total) by mouth daily. 14 tablet 0   levocetirizine (XYZAL) 5 MG tablet Take 5 mg by mouth daily.     levothyroxine (SYNTHROID) 88 MCG tablet Take 1 tablet (88 mcg total) by mouth daily. 90 tablet 3   losartan (COZAAR) 50 MG tablet Take 50 mg by mouth daily.     megestrol (MEGACE) 40 MG/ML suspension Take 400 mg by mouth every morning.     metFORMIN (GLUCOPHAGE) 500 MG  tablet Take 500 mg by mouth daily at 6 (six) AM.     montelukast (SINGULAIR) 10 MG tablet Take 1 tablet (10 mg total) by mouth at bedtime. 30 tablet 5   Multiple Vitamins-Minerals (PRESERVISION/LUTEIN PO) Take 1 tablet by mouth 2 (two) times daily. Unknown strength     nitroGLYCERIN (NITROSTAT) 0.4 MG SL tablet Place 1 tablet (0.4 mg total) under the tongue every 5 (five) minutes as needed for chest pain. MAX 3 DOSES IN 15 MINUTES. IF STILL PAIN CALL 911 25 tablet 6   pantoprazole (PROTONIX) 40 MG tablet Take one tablet by mouth twice daily as directed. (Patient taking differently: Take 40 mg by mouth 2 (  two) times daily. Take one tablet by mouth twice daily as directed.) 180 tablet 1   potassium chloride SA (KLOR-CON M) 20 MEQ tablet Take 1 tablet (20 mEq total) by mouth daily. 90 tablet 3   predniSONE (DELTASONE) 1 MG tablet Take 8 mg by mouth daily.     predniSONE (DELTASONE) 5 MG tablet Take 5 mg by mouth daily.     Spacer/Aero-Holding Dorise Bullion As directed (Patient taking differently: 1 each by Other route See admin instructions. As directed) 1 each 0   traZODone (DESYREL) 50 MG tablet Take 100 mg by mouth at bedtime as needed for sleep.     furosemide (LASIX) 40 MG tablet TAKE 1 TABLET BY MOUTH EVERY DAY (Patient not taking: Reported on 10/22/2022) 90 tablet 1   venlafaxine XR (EFFEXOR-XR) 75 MG 24 hr capsule Take 75 mg by mouth daily. (Patient not taking: Reported on 10/22/2022)     No facility-administered medications prior to visit.    Review of Systems  Constitutional:  Negative for chills, diaphoresis, fever, malaise/fatigue and weight loss.  HENT:  Negative for congestion.   Respiratory:  Positive for shortness of breath. Negative for cough, hemoptysis, sputum production and wheezing.   Cardiovascular:  Positive for leg swelling. Negative for chest pain and palpitations.     Objective:   Vitals:   10/22/22 1412  BP: (!) 110/50  Pulse: 92  SpO2: 96%  Weight: 123 lb (55.8 kg)   Height: 5' 1.5" (1.562 m)   SpO2: 96 % O2 Device: None (Room air)  Physical Exam: General: Chronically ill-appearing, no acute distress HENT: [place], AT Eyes: EOMI, no scleral icterus Respiratory: Diminished breath sounds auscultation bilaterally.  No crackles, wheezing or rales Cardiovascular: RRR, -M/R/G, no JVD Extremities:-Edema,-tenderness Neuro: AAO x4, CNII-XII grossly intact Psych: Normal mood, normal affect  Data Reviewed:  Imaging: CT chest/abdomen/pelvis 12/10/2017 (report only)- Mild bilateral dependent atelectasis.  Mild chronic subpleural scarring lingula.  Isolated paraseptal emphysema in the medial upper lobe. CT 07/18/21 - Chronic appearing subpleural bibasilar scarring. Associated with ground glass CXR 04/04/22 - No infiltrate effusion or edema  PFT: 09/09/2020 FVC 2.46 (103%) FEV1 2.24 (126%) Ratio 91  Interpretation: Normal spirometry  Labs: CBC    Component Value Date/Time   WBC 12.5 (H) 04/04/2022 1030   RBC 3.76 (L) 04/04/2022 1030   HGB 12.4 04/04/2022 1030   HCT 37.4 04/04/2022 1030   PLT 271 04/04/2022 1030   MCV 99.5 04/04/2022 1030   MCH 33.0 04/04/2022 1030   MCHC 33.2 04/04/2022 1030   RDW 13.5 04/04/2022 1030   LYMPHSABS 2.2 04/04/2022 1030   MONOABS 0.7 04/04/2022 1030   EOSABS 0.2 04/04/2022 1030   BASOSABS 0.1 04/04/2022 1030   Absolute eosinophils 06/04/2020-200     Assessment & Plan:   Discussion: 87 year old female never smoker with COVID long hauler (since May 123456), chronic systolic heart failure, hx breast cancer s/p chemoradiation and GERD who presents for follow-up. New O2 requirement with recent infection. Suspect chronic in the absence of acute findings on chest imaging.  [place] with shortness of breath --CONTINUE Duonebs (nebulizer) ONCE a day and AS NEEDED --CONTINUE Pulmicort (nebulizer) TWICE a day  Acute vs chronic hypoxemic respiratory failure --Reviewed CXR. No infiltrates --Ambulatory walk today  demonstrated desaturations --START wearing 2L oxygen with activity and sleep   Health Maintenance Immunization History  Administered Date(s) Administered   Fluad Quad(high Dose 65+) 06/20/2021   Influenza-Unspecified 05/22/2013   PFIZER Comirnaty(Gray Top)Covid-19 Tri-Sucrose Vaccine  08/09/2020   PFIZER(Purple Top)SARS-COV-2 Vaccination 11/03/2019, 11/28/2019   Pfizer Covid-19 Vaccine Bivalent Booster 5y-11y 06/03/2021   CT Lung Screen-not qualified never smoker  Orders Placed This Encounter  Procedures   DG Chest 2 View    Standing Status:   Future    Standing Expiration Date:   10/23/2023    Order Specific Question:   Reason for Exam (SYMPTOM  OR DIAGNOSIS REQUIRED)    Answer:   chronic hypoxemic respiratory failure    Order Specific Question:   Preferred imaging location?    Answer:   Internal   No orders of the defined types were placed in this encounter.  Return in about 3 months (around 01/20/2023).    I have spent a total time of 35-minutes on the day of the appointment including chart review, data review, collecting history, coordinating care and discussing medical diagnosis and plan with the patient/family. Past medical history, allergies, medications were reviewed. Pertinent imaging, labs and tests included in this note have been reviewed and interpreted independently by me.  [place], MD [facility] 10/22/2022 3:04 PM  Office Number [phone]

## 2022-10-23 ENCOUNTER — Telehealth: Payer: Self-pay | Admitting: Pulmonary Disease

## 2022-10-23 NOTE — Telephone Encounter (Signed)
Jodi Valdez M  [facility] Clinical Pool5 hours ago (9:05 AM)    PT husband calling saying someone called them yesterday possibly about Xray results. Pls call him @ [phone]    Do not see where anyone from our office had tried to call either pt or spouse.  Called and spoke with pt's spouse letting him know that I couldn't see where anyone had tried to contact them and he said that he received a VM from Dr. Loanne Drilling letting them know the results of cxr. Nothing further needed.

## 2022-10-26 ENCOUNTER — Other Ambulatory Visit: Payer: Self-pay | Admitting: Allergy and Immunology

## 2022-10-26 DIAGNOSIS — J9611 Chronic respiratory failure with hypoxia: Secondary | ICD-10-CM | POA: Diagnosis not present

## 2022-10-29 ENCOUNTER — Ambulatory Visit
Admission: RE | Admit: 2022-10-29 | Discharge: 2022-10-29 | Disposition: A | Discharge status: Home / Self Care | Payer: Medicare PPO | Source: Ambulatory Visit | Attending: Family Medicine | Admitting: Family Medicine

## 2022-10-29 ENCOUNTER — Other Ambulatory Visit: Payer: Self-pay | Admitting: Family Medicine

## 2022-10-29 ENCOUNTER — Ambulatory Visit: Payer: Medicare PPO

## 2022-10-29 DIAGNOSIS — N644 Mastodynia: Secondary | ICD-10-CM | POA: Diagnosis not present

## 2022-10-30 ENCOUNTER — Other Ambulatory Visit: Payer: Self-pay

## 2022-10-30 DIAGNOSIS — J449 Chronic obstructive pulmonary disease, unspecified: Secondary | ICD-10-CM | POA: Diagnosis not present

## 2022-10-30 MED ORDER — LEVOTHYROXINE SODIUM 88 MCG PO TABS: 88.0000 ug | ORAL_TABLET | Freq: Every day | ORAL | 3 refills | Status: DC

## 2022-10-31 ENCOUNTER — Encounter (HOSPITAL_BASED_OUTPATIENT_CLINIC_OR_DEPARTMENT_OTHER): Payer: Self-pay | Admitting: Pulmonary Disease

## 2022-11-04 DIAGNOSIS — D72829 Elevated white blood cell count, unspecified: Secondary | ICD-10-CM | POA: Diagnosis not present

## 2022-11-04 DIAGNOSIS — R351 Nocturia: Secondary | ICD-10-CM | POA: Diagnosis not present

## 2022-11-04 DIAGNOSIS — N390 Urinary tract infection, site not specified: Secondary | ICD-10-CM | POA: Diagnosis not present

## 2022-11-09 ENCOUNTER — Encounter: Payer: Self-pay | Admitting: Cardiology

## 2022-11-09 ENCOUNTER — Ambulatory Visit: Payer: Medicare PPO | Attending: Cardiology | Admitting: Cardiology

## 2022-11-09 VITALS — BP 156/70 | HR 94 | Ht 64.0 in | Wt 125.4 lb

## 2022-11-09 DIAGNOSIS — I420 Dilated cardiomyopathy: Secondary | ICD-10-CM | POA: Diagnosis not present

## 2022-11-09 DIAGNOSIS — E782 Mixed hyperlipidemia: Secondary | ICD-10-CM | POA: Diagnosis not present

## 2022-11-09 DIAGNOSIS — I10 Essential (primary) hypertension: Secondary | ICD-10-CM

## 2022-11-09 DIAGNOSIS — M7989 Other specified soft tissue disorders: Secondary | ICD-10-CM | POA: Diagnosis not present

## 2022-11-09 MED ORDER — ISOSORBIDE MONONITRATE ER 30 MG PO TB24: 30.0000 mg | ORAL_TABLET | Freq: Every day | ORAL | 3 refills | Status: DC

## 2022-11-09 NOTE — Progress Notes (Unsigned)
Cardiology Office Note:    Date:  11/09/2022   ID:  Jodi Valdez, DOB 1936/07/27, MRN [id number]  PCP:  Leonides Sake, MD  Cardiologist:  Jenne Campus, MD    Referring MD: Suzan Garibaldi, FNP   No chief complaint on file.   History of Present Illness:    Jodi Valdez is a 87 y.o. female with past medical history significant for essential hypertension, breast CA, status post radiation chemotherapy, cardiomyopathy ejection fraction 55% however last echocardiogram done in the hospital show echocardiogram 4045% then again echocardiogram showed low normal ejection fraction, stress to showing no evidence of ischemia.  She comes today to months for follow-up and from get ago she tells me that her heart is getting worse she says she got more shortness of breath, she did see pulmonologist with put who put her on oxygen  Past Medical History:  Diagnosis Date   Allergy    Anemia    Asthma    Breast cancer ([place])    Cancer ([place]) 11/01/2000   left breast cancer   GERD (gastroesophageal reflux disease)    Hyperlipidemia    Hypertension    Personal history of chemotherapy    2002   Personal history of radiation therapy    2002   Polymyalgia [facility])     Past Surgical History:  Procedure Laterality Date   ABDOMINAL HYSTERECTOMY  1975   BREAST SURGERY Left 11/01/2000   masectomy   COLONOSCOPY  2015   CYSTOCELE REPAIR  09/1999   MASTECTOMY Left    2002 with tramflap   RECTOCELE REPAIR  09/1999    Current Medications: Current Meds  Medication Sig   albuterol (VENTOLIN HFA) 108 (90 Base) MCG/ACT inhaler Inhale 2 puffs into the lungs every 6 (six) hours as needed for wheezing or shortness of breath.   alendronate (FOSAMAX) 70 MG tablet Take 70 mg by mouth once a week.   amLODipine (NORVASC) 5 MG tablet Take 5 mg by mouth 2 (two) times daily.   budesonide (PULMICORT) 0.5 MG/2ML nebulizer solution Use one vial in nebulizer twice daily as directed. (Patient taking  differently: Take 0.5 mg by nebulization See admin instructions. Use one vial in nebulizer twice daily as directed.)   carvedilol (COREG) 6.25 MG tablet Take 6.25 mg by mouth 2 (two) times daily with a meal.   Cholecalciferol (VITAMIN D3) 1000 UNITS CAPS Take 1,000 Units by mouth 2 (two) times daily.   EPINEPHrine 0.3 mg/0.3 mL IJ SOAJ injection Use as directed for life-threatening allergic reaction. (Patient taking differently: Inject 0.3 mg into the muscle as needed for anaphylaxis. Use as directed for life-threatening allergic reaction.)   furosemide (LASIX) 40 MG tablet TAKE 1 TABLET BY MOUTH EVERY DAY   ipratropium-albuterol (DUONEB) 0.5-2.5 (3) MG/3ML SOLN Take 3 mLs by nebulization every 6 (six) hours as needed. (Patient taking differently: Take 3 mLs by nebulization every 6 (six) hours as needed (sob).)   isosorbide mononitrate (IMDUR) 30 MG 24 hr tablet Take 1 tablet (30 mg total) by mouth daily.   levocetirizine (XYZAL) 5 MG tablet Take 5 mg by mouth daily.   levothyroxine (SYNTHROID) 88 MCG tablet Take 1 tablet (88 mcg total) by mouth daily.   losartan (COZAAR) 50 MG tablet Take 50 mg by mouth daily.   megestrol (MEGACE) 40 MG/ML suspension Take 400 mg by mouth every morning.   metFORMIN (GLUCOPHAGE) 500 MG tablet Take 500 mg by mouth daily at 6 (six) AM.   montelukast (SINGULAIR) 10  MG tablet TAKE 1 TABLET AT BEDTIME   Multiple Vitamins-Minerals (PRESERVISION/LUTEIN PO) Take 1 tablet by mouth 2 (two) times daily. Unknown strength   nitroGLYCERIN (NITROSTAT) 0.4 MG SL tablet Place 1 tablet (0.4 mg total) under the tongue every 5 (five) minutes as needed for chest pain. MAX 3 DOSES IN 15 MINUTES. IF STILL PAIN CALL 911   pantoprazole (PROTONIX) 40 MG tablet Take one tablet by mouth twice daily as directed. (Patient taking differently: Take 40 mg by mouth 2 (two) times daily. Take one tablet by mouth twice daily as directed.)   potassium chloride SA (KLOR-CON M) 20 MEQ tablet Take 1 tablet  (20 mEq total) by mouth daily.   predniSONE (DELTASONE) 1 MG tablet Take 8 mg by mouth daily.   predniSONE (DELTASONE) 5 MG tablet Take 5 mg by mouth daily.   Spacer/Aero-Holding Dorise Bullion As directed (Patient taking differently: 1 each by Other route See admin instructions. As directed)   traZODone (DESYREL) 100 MG tablet Take 100 mg by mouth at bedtime as needed for sleep. Take 2 tablets (200 mg) nightly prn for sleep   venlafaxine XR (EFFEXOR-XR) 75 MG 24 hr capsule Take 75 mg by mouth daily.     Allergies:   Propranolol, Gabapentin, Codeine, and Nitrofuran derivatives   Social History   Socioeconomic History   Marital status: Married    Spouse name: Not on file   Number of children: 2   Years of education: Not on file   Highest education level: Not on file  Occupational History   Occupation: Retired    Fish farm manager: RETIRED  Tobacco Use   Smoking status: Never   Smokeless tobacco: Never  Vaping Use   Vaping Use: Never used  Substance and Sexual Activity   Alcohol use: No    Alcohol/week: 0.0 standard drinks of alcohol   Drug use: No   Sexual activity: Not on file  Other Topics Concern   Not on file  Social History Narrative   Not on file   Social Determinants of Health   Financial Resource Strain: Low Risk  (08/04/2021)   Overall Financial Resource Strain (CARDIA)    Difficulty of Paying Living Expenses: Not hard at all  Food Insecurity: No Food Insecurity (08/04/2021)   Hunger Vital Sign    Worried About Running Out of Food in the Last Year: Never true    [place] in the Last Year: Never true  Transportation Needs: No Transportation Needs (08/04/2021)   PRAPARE - Hydrologist (Medical): No    Lack of Transportation (Non-Medical): No  Physical Activity: Inactive (08/04/2021)   Exercise Vital Sign    Days of Exercise per Week: 0 days    Minutes of Exercise per Session: 0 min  Stress: Stress Concern Present (08/04/2021)    [place]    Feeling of Stress : To some extent  Social Connections: Moderately Integrated (08/04/2021)   Social Connection and Isolation Panel [NHANES]    Frequency of Communication with Friends and Family: Twice a week    Frequency of Social Gatherings with Friends and Family: Twice a week    Attends Religious Services: More than 4 times per year    Active Member of [employer] or Organizations: No    Attends Archivist Meetings: Never    Marital Status: Married     Family History: The patient's family history is negative for Colon cancer, Colon  polyps, Diabetes, Kidney disease, and Esophageal cancer. ROS:   Please see the history of present illness.    All 14 point review of systems negative except as described per history of present illness  EKGs/Labs/Other Studies Reviewed:      Recent Labs: 04/04/2022: ALT 21; BUN 16; Creatinine, Ser 0.85; Hemoglobin 12.4; Platelets 271; Potassium 4.0; Sodium 141 10/20/2022: TSH 0.36  Recent Lipid Panel No results found for: "CHOL", "TRIG", "HDL", "CHOLHDL", "VLDL", "LDLCALC", "LDLDIRECT"  Physical Exam:    VS:  BP (!) 156/70 (BP Location: Right Arm, Patient Position: Sitting, Cuff Size: Small)   Pulse 94   Ht 5' 4"$  (1.626 m)   Wt 125 lb 6.4 oz (56.9 kg)   SpO2 97%   BMI 21.52 kg/m     Wt Readings from Last 3 Encounters:  11/09/22 125 lb 6.4 oz (56.9 kg)  10/22/22 123 lb (55.8 kg)  10/20/22 124 lb (56.2 kg)     GEN:  Well nourished, well developed in no acute distress HEENT: Normal NECK: No JVD; No carotid bruits LYMPHATICS: No lymphadenopathy CARDIAC: RRR, no murmurs, no rubs, no gallops RESPIRATORY:  Clear to auscultation without rales, wheezing or rhonchi  ABDOMEN: Soft, non-tender, non-distended MUSCULOSKELETAL:  No edema; No deformity  SKIN: Warm and dry LOWER EXTREMITIES: Minimal swelling NEUROLOGIC:  Alert and oriented x 3 PSYCHIATRIC:  Normal  affect   ASSESSMENT:    1. Dilated cardiomyopathy ([place])   2. Essential hypertension   3. Mixed hyperlipidemia   4. Swelling of both lower extremities    PLAN:    In order of problems listed above:  History of cardiomyopathy.  Etiology still unclear.  There are some question about segmental motion abnormality seen on stress test raising suspicion for MI at the same time echocardiogram did not confirm that.  She is complaining of feeling weak tired exhausted short of breath also swelling of lower extremities I will repeat her echocardiogram to recheck left ventricle ejection fraction, will also do proBNP as well as Chem-7 today. Essential hypertension.  Slightly elevated in the office but she tells me good at home.  Will continue present medical therapy. Mixed dyslipidemia I did review her K PN which show me her LDL of 87 HDL 39.  Will continue present management Respiratory failure that required oxygen.  I will rule out cardiac causes of her symptomatology   Medication Adjustments/Labs and Tests Ordered: Current medicines are reviewed at length with the patient today.  Concerns regarding medicines are outlined above.  No orders of the defined types were placed in this encounter.  Medication changes: No orders of the defined types were placed in this encounter.   Signed, [place] Liter, MD, [facility] 11/09/2022 2:50 PM    [facility]

## 2022-11-09 NOTE — Patient Instructions (Signed)
Medication Instructions:  Your physician recommends that you continue on your current medications as directed. Please refer to the Current Medication list given to you today.  *If you need a refill on your cardiac medications before your next appointment, please call your pharmacy*   Lab Work: BMP, ProBNP- Today If you have labs (blood work) drawn today and your tests are completely normal, you will receive your results only by: [place] (if you have MyChart) OR A paper copy in the mail If you have any lab test that is abnormal or we need to change your treatment, we will call you to review the results.   Testing/Procedures: Your physician has requested that you have an echocardiogram. Echocardiography is a painless test that uses sound waves to create images of your heart. It provides your doctor with information about the size and shape of your heart and how well your heart's chambers and valves are working. This procedure takes approximately one hour. There are no restrictions for this procedure. Please do NOT wear cologne, perfume, aftershave, or lotions (deodorant is allowed). Please arrive 15 minutes prior to your appointment time.    Follow-Up: At [facility], you and your health needs are our priority.  As part of our continuing mission to provide you with exceptional heart care, we have created designated Provider Care Teams.  These Care Teams include your primary Cardiologist (physician) and Advanced Practice Providers (APPs -  Physician Assistants and Nurse Practitioners) who all work together to provide you with the care you need, when you need it.  We recommend signing up for the patient portal called "MyChart".  Sign up information is provided on this After Visit Summary.  MyChart is used to connect with patients for Virtual Visits (Telemedicine).  Patients are able to view lab/test results, encounter notes, upcoming appointments, etc.  Non-urgent messages can be sent to  your provider as well.   To learn more about what you can do with MyChart, go to [email or web address].    Your next appointment:   4 month(s)  The format for your next appointment:   In Person  Provider:   Jenne Campus, MD    Other Instructions NA

## 2022-11-10 LAB — BASIC METABOLIC PANEL
BUN/Creatinine Ratio: 28 (ref 12–28)
BUN: 22 mg/dL (ref 8–27)
CO2: 24 mmol/L (ref 20–29)
Calcium: 9.3 mg/dL (ref 8.7–10.3)
Chloride: 99 mmol/L (ref 96–106)
Creatinine, Ser: 0.8 mg/dL (ref 0.57–1.00)
Glucose: 163 mg/dL — ABNORMAL HIGH (ref 70–99)
Potassium: 4.4 mmol/L (ref 3.5–5.2)
Sodium: 138 mmol/L (ref 134–144)
eGFR: 73 mL/min/{1.73_m2} (ref >59)

## 2022-11-10 LAB — PRO B NATRIURETIC PEPTIDE: NT-Pro BNP: 550 pg/mL (ref 0–738)

## 2022-11-11 ENCOUNTER — Telehealth: Payer: Self-pay

## 2022-11-11 NOTE — Telephone Encounter (Signed)
Results reviewed with pt's spouse- per DPR- as per Dr. Wendy Poet note.  Spouse verbalized understanding and had no additional questions. Routed to PCP

## 2022-11-16 ENCOUNTER — Other Ambulatory Visit: Payer: Self-pay | Admitting: Allergy and Immunology

## 2022-11-23 ENCOUNTER — Ambulatory Visit: Payer: Medicare PPO | Attending: Cardiology

## 2022-11-23 DIAGNOSIS — M7989 Other specified soft tissue disorders: Secondary | ICD-10-CM | POA: Diagnosis not present

## 2022-11-23 DIAGNOSIS — I420 Dilated cardiomyopathy: Secondary | ICD-10-CM

## 2022-11-23 DIAGNOSIS — I10 Essential (primary) hypertension: Secondary | ICD-10-CM | POA: Diagnosis not present

## 2022-11-23 LAB — ECHOCARDIOGRAM COMPLETE
Area-P 1/2: 7.16 cm2
S' Lateral: 2.5 cm

## 2022-11-24 ENCOUNTER — Telehealth: Payer: Self-pay | Admitting: Cardiology

## 2022-11-24 DIAGNOSIS — J9611 Chronic respiratory failure with hypoxia: Secondary | ICD-10-CM | POA: Diagnosis not present

## 2022-11-24 NOTE — Telephone Encounter (Signed)
Advised that Dr. Agustin Cree has not reviewed at this time but once reviewed we will call pt with update. Pt verbalized understanding and had no additional questions.

## 2022-11-24 NOTE — Telephone Encounter (Signed)
Pt is requesting call back to discuss results of echo.

## 2022-11-26 ENCOUNTER — Telehealth: Payer: Self-pay

## 2022-11-26 NOTE — Telephone Encounter (Signed)
Left message on My Chart with normal results per Dr. Wendy Poet note. Routed to PCP.

## 2022-11-30 DIAGNOSIS — K219 Gastro-esophageal reflux disease without esophagitis: Secondary | ICD-10-CM | POA: Diagnosis not present

## 2022-11-30 DIAGNOSIS — R600 Localized edema: Secondary | ICD-10-CM | POA: Diagnosis not present

## 2022-11-30 DIAGNOSIS — R351 Nocturia: Secondary | ICD-10-CM | POA: Diagnosis not present

## 2022-12-03 ENCOUNTER — Other Ambulatory Visit: Payer: Self-pay | Admitting: Allergy and Immunology

## 2022-12-07 ENCOUNTER — Other Ambulatory Visit: Payer: Self-pay

## 2022-12-07 MED ORDER — BUDESONIDE 0.5 MG/2ML IN SUSP: RESPIRATORY_TRACT | 5 refills | Status: DC

## 2022-12-25 DIAGNOSIS — J9611 Chronic respiratory failure with hypoxia: Secondary | ICD-10-CM | POA: Diagnosis not present

## 2022-12-29 DIAGNOSIS — R195 Other fecal abnormalities: Secondary | ICD-10-CM | POA: Diagnosis not present

## 2022-12-29 DIAGNOSIS — Z9181 History of falling: Secondary | ICD-10-CM | POA: Diagnosis not present

## 2022-12-29 DIAGNOSIS — E1169 Type 2 diabetes mellitus with other specified complication: Secondary | ICD-10-CM | POA: Diagnosis not present

## 2022-12-29 DIAGNOSIS — Z6821 Body mass index (BMI) 21.0-21.9, adult: Secondary | ICD-10-CM | POA: Diagnosis not present

## 2023-01-06 ENCOUNTER — Ambulatory Visit: Payer: Medicare PPO | Admitting: Gastroenterology

## 2023-01-06 ENCOUNTER — Encounter: Payer: Self-pay | Admitting: Gastroenterology

## 2023-01-06 ENCOUNTER — Other Ambulatory Visit (INDEPENDENT_AMBULATORY_CARE_PROVIDER_SITE_OTHER): Payer: Medicare PPO

## 2023-01-06 VITALS — BP 124/74 | HR 92 | Ht 63.0 in | Wt 125.0 lb

## 2023-01-06 DIAGNOSIS — K869 Disease of pancreas, unspecified: Secondary | ICD-10-CM

## 2023-01-06 DIAGNOSIS — R1030 Lower abdominal pain, unspecified: Secondary | ICD-10-CM

## 2023-01-06 DIAGNOSIS — D649 Anemia, unspecified: Secondary | ICD-10-CM | POA: Diagnosis not present

## 2023-01-06 DIAGNOSIS — R197 Diarrhea, unspecified: Secondary | ICD-10-CM

## 2023-01-06 NOTE — Patient Instructions (Addendum)
_______________________________________________________  If your blood pressure at your visit was 140/90 or greater, please contact your primary care physician to follow up on this.  _______________________________________________________  If you are age 85 or older, your body mass index should be between 23-30. Your Body mass index is 22.14 kg/m. If this is out of the aforementioned range listed, please consider follow up with your Primary Care Provider.  If you are age 37 or younger, your body mass index should be between 19-25. Your Body mass index is 22.14 kg/m. If this is out of the aformentioned range listed, please consider follow up with your Primary Care Provider.   ________________________________________________________  The [facility] providers would like to encourage you to use [facility] to communicate with providers for non-urgent requests or questions.  Due to long hold times on the telephone, sending your provider a message by [facility] may be a faster and more efficient way to get a response.  Please allow 48 business hours for a response.  Please remember that this is for non-urgent requests.  _______________________________________________________  Your provider has requested that you go to the basement level for lab work before leaving today. Press "B" on the elevator. The lab is located at the first door on the left as you exit the elevator.  You have been scheduled for an appointment with Dr. Chales Abrahams on February 23, 2023 at 8:50 am  . Please arrive 10 minutes early for your appointment.  Your provider has ordered "Diathe[employer]" stool testing for you. You have received a kit from our office today containing all necessary supplies to complete this test. Please carefully read the stool collection instructions provided in the kit before opening the accompanying materials. In addition, be sure to place the label from the top left corner of the laboratory request sheet onto the "puritan  opti-swab" tube that is supplied in the kit. This label should include your full name and date of birth. After completing the test, you should secure the purtian tube into the specimen biohazard bag. The laboratory request information sheet (including date and time of specimen collection) should be placed into the outside pocket of the specimen biohazard bag and returned to the [place] lab with 2 days of collection.   You have been scheduled for a CT scan of the abdomen and pelvis at [facility] ([address] [place], [place], [place] [address]).   You are scheduled on 01-27-2023 at 1230pm . You should arrive by 1015am for your appointment time for registration. Please follow the written instructions below on the day of your exam:  WARNING: IF YOU ARE ALLERGIC TO IODINE/X-RAY DYE, PLEASE NOTIFY RADIOLOGY IMMEDIATELY AT [phone]! YOU WILL BE GIVEN A 13 HOUR PREMEDICATION PREP.  1) Do not eat or drink anything after 830am (4 hours prior to your test) 2) You will be given 2 bottles of oral contrast to drink on site. The solution may taste better if refrigerated, but do NOT add ice or any other liquid to this solution. Shake well before drinking.    Drink 1 bottle of contrast @ 1030am (2 hours prior to your exam)  Drink 1 bottle of contrast @ 1130am (1 hour prior to your exam)  You may take any medications as prescribed with a small amount of water, if necessary. If you take any of the following medications: METFORMIN, GLUCOPHAGE, GLUCOVANCE, AVANDAMET, RIOMET, FORTAMET, ACTOPLUS MET, JANUMET, GLUMETZA or METAGLIP, you MAY be asked to HOLD this medication 48 hours AFTER the exam.  The purpose  of you drinking the oral contrast is to aid in the visualization of your intestinal tract. The contrast solution may cause some diarrhea. Depending on your individual set of symptoms, you may also receive an intravenous injection of x-ray contrast/dye. Plan on being at [facility] for 30 minutes or  longer, depending on the type of exam you are having performed.  This test typically takes 30-45 minutes to complete.  If you have any questions regarding your exam or if you need to reschedule, you may call the CT department at [phone] between the hours of 8:00 am and 5:00 pm, Monday-Friday.  ________________________________________________________________________  Thank you,  Dr. Lynann Bologna

## 2023-01-06 NOTE — Progress Notes (Signed)
Chief Complaint: Diarrhea  Referring Provider:  Lonie Peak, PA-C      ASSESSMENT AND PLAN;   #1.  Diarrhea/lower abdo pain.  H/O constipation. Neg colon 07/2014, neg CT 11/2017. #2.  Slowly enlarging IPMN, uncinate process without any worrisome factors/no ductal dilatation. (CT 11/2017, MRI 09/2014, CT 06/2014). PET 07/2021 -measuring 2.4 x 1.9 cm. No corresponding FDG uptake identified within this lesion. On 12/09/2017 this measured 1.2 by 1.1 cm.    Plan:  -CBC, CMP, CRP, CA 19-9, CEA -Stool studies for GI Pathogen (includes C. Diff),fecal elastase and Calprotectin) -CT AP with contrast -FU in after CT. Then schedule colon and/or MRCP.      HPI:    Jodi Valdez is a 87 y.o. female  With multiple medical problems as below Seen as an emergency workin  Diarrhea yesterday1/day without nocturnal symptoms.  Had mucus in the stools.  Had 1 episode of incontinence. With lower abdominal crampy pain No melena or hematochezia Has previous history of constipation Denies having any weight loss  She denies having any upper GI symptoms including nausea, vomiting, heartburn, regurgitation, odynophagia or dysphagia.  Very nervous and is convinced that she has "colon cancer".  Apparently, her neighbor and best friend had colon cancer  No fever chills or night sweats.  No recent antibiotics.  No travel.   Denies having any jaundice dark urine or pale stools.  No recent weight loss.  Wt Readings from Last 3 Encounters:  01/06/23 125 lb (56.7 kg)  11/09/22 125 lb 6.4 oz (56.9 kg)  10/22/22 123 lb (55.8 kg)       Past GI workup: -Neg colon 2015 Dr Marina Goodell -reviewed with the patient and patient's husband. panc cyst stable since 2015 -she would like to hold off on any further evaluation.  Had multiple MRIs previously.  No ductal dilatation  08/18/2021 PET scan 1. No signs of FDG avid bone metastases. No suspicious mass or adenopathy identified to suggest nodal or  solid organ metastasis. 2. Asymmetric increased uptake within the right lobe of thyroid gland without corresponding underlying nodule is identified. Findings may be seen in the setting of thyroiditis. Correlation with thyroid function tests may be helpful. 3. Benign appearing, non FDG avid, cystic lesion is again noted within the uncinate process of pancreas. Increased in size from 12/09/2017. According to consensus criteria as this remains less than 2.5 cm follow-up imaging in 12 months with pancreas protocol MRI or CT is recommended. Please note: The decision to pursue imaging follow-up should be anchored to a patient's overall health and preferences; such workup is only advised if the patient is a surgical candidate. 4. Aortic Atherosclerosis (ICD10-I70.0). Coronary artery calcifications.  Wt Readings from Last 3 Encounters:  01/06/23 125 lb (56.7 kg)  11/09/22 125 lb 6.4 oz (56.9 kg)  10/22/22 123 lb (55.8 kg)      Past Medical History:  Diagnosis Date   Allergy    Asthma    Cancer (HCC) 11/01/2000   left breast cancer   GERD (gastroesophageal reflux disease)    Hypertension    Personal history of chemotherapy    2002   Personal history of radiation therapy    2002   Polymyalgia [facility])     Past Surgical History:  Procedure Laterality Date   ABDOMINAL HYSTERECTOMY  1975   BREAST SURGERY Left 11/01/2000   masectomy   COLONOSCOPY  2015   CYSTOCELE REPAIR  09/1999   MASTECTOMY Left    2002 with tramflap  RECTOCELE REPAIR  09/1999    Family History  Problem Relation Age of Onset   Colon cancer Neg Hx    Colon polyps Neg Hx    Diabetes Neg Hx    Kidney disease Neg Hx    Esophageal cancer Neg Hx     Social History   Tobacco Use   Smoking status: Never Smoker   Smokeless tobacco: Never Used  Substance Use Topics   Alcohol use: No    Alcohol/week: 0.0 standard drinks   Drug use: No    Current Outpatient Medications  Medication Sig Dispense Refill    alendronate (FOSAMAX) 70 MG tablet Take 70 mg by mouth once a week.     amitriptyline (ELAVIL) 10 MG tablet Take 10 mg by mouth at bedtime.      benazepril (LOTENSIN) 40 MG tablet Take 40 mg by mouth daily.     carvedilol (COREG) 3.125 MG tablet Take 3.125 mg by mouth 2 (two) times daily with a meal.      Cholecalciferol (VITAMIN D3) 1000 UNITS CAPS Take 1,000 Units by mouth 2 (two) times daily.     EPINEPHrine 0.3 mg/0.3 mL IJ SOAJ injection Use as directed for life-threatening allergic reaction. 2 Device 3   hydrochlorothiazide (HYDRODIURIL) 25 MG tablet Take 25 mg by mouth daily.      levothyroxine (SYNTHROID, LEVOTHROID) 125 MCG tablet Take 62.5 mcg by mouth daily before breakfast.      Multiple Vitamins-Minerals (PRESERVISION/LUTEIN PO) Take 1 tablet by mouth 2 (two) times daily.       Omega-3 Fatty Acids (FISH OIL) 1000 MG CAPS Take 2 capsules by mouth daily.      pravastatin (PRAVACHOL) 20 MG tablet Take 20 mg by mouth at bedtime.     predniSONE (DELTASONE) 5 MG tablet Take 8 mg by mouth daily. 8 mg total dose  0   No current facility-administered medications for this visit.     Allergies  Allergen Reactions   Propranolol Other (See Comments)    Other reaction(s): Other (See Comments) hallucinations   Codeine Nausea And Vomiting   Nitrofuran Derivatives Nausea And Vomiting    Review of Systems:  Negative except for HPI     Physical Exam:    BP 128/70   Pulse 91   Ht 5\' 4"  (1.626 m)   Wt 129 lb (58.5 kg)   BMI 22.14 kg/m  Filed Weights   06/29/18 1433  Weight: 129 lb (58.5 kg)   Constitutional:  Well-developed, in no acute distress. Psychiatric: Normal mood and affect. Behavior is normal. HEENT: Pupils normal.  Conjunctivae are normal. No scleral icterus. Cardiovascular: Normal rate, regular rhythm. No edema Pulmonary/chest: Effort normal and breath sounds normal. No wheezing, rales or rhonchi. Abdominal: Soft, nondistended. Nontender. Bowel sounds active  throughout. There are no masses palpable. No hepatomegaly. Rectal:  defered Neurological: Alert and oriented to person place and time. Skin: Skin is warm and dry. No rashes noted.  Data Reviewed: I have personally reviewed following labs and imaging studies  CBC: CBC Latest Ref Rng & Units 08/07/2014 08/11/2008  WBC 4.0 - 10.5 K/uL 8.8 7.4  Hemoglobin 12.0 - 15.0 g/dL 85.9 29.2  Hematocrit 44.6 - 46.0 % 41.7 35.8(L)  Platelets 150 - 400 K/uL 279 215    CMP: CMP Latest Ref Rng & Units 10/19/2014 08/07/2014 08/11/2008  Glucose 70 - 99 mg/dL - 286(N) 95  BUN 6 - 23 mg/dL - 17 18  Creatinine 8.17 - 1.10 mg/dL 7.11 6.57 9.03  Sodium 137 - 147 mEq/L - 139 140  Potassium 3.7 - 5.3 mEq/L - 3.8 4.4  Chloride 96 - 112 mEq/L - 95(L) 108  CO2 19 - 32 mEq/L - 31 27  Calcium 8.4 - 10.5 mg/dL - 9.9 9.1  Total Protein 6.0 - 8.3 g/dL - 7.1 -  Total Bilirubin 0.3 - 1.2 mg/dL - 0.4 -  Alkaline Phos 39 - 117 U/L - 48 -  AST 0 - 37 U/L - 16 -  ALT 0 - 35 U/L - 25 -      Edman Circle, MD 06/29/2018, 2:51 PM  Cc: Lonie Peak, PA-C

## 2023-01-07 LAB — COMPREHENSIVE METABOLIC PANEL
ALT: 29 U/L (ref 0–35)
AST: 20 U/L (ref 0–37)
Albumin: 3.9 g/dL (ref 3.5–5.2)
Alkaline Phosphatase: 41 U/L (ref 39–117)
BUN: 26 mg/dL — ABNORMAL HIGH (ref 6–23)
CO2: 28 mEq/L (ref 19–32)
Calcium: 9.6 mg/dL (ref 8.4–10.5)
Chloride: 98 mEq/L (ref 96–112)
Creatinine, Ser: 0.9 mg/dL (ref 0.40–1.20)
GFR: 58.7 mL/min — ABNORMAL LOW (ref >60.00)
Glucose, Bld: 163 mg/dL — ABNORMAL HIGH (ref 70–99)
Potassium: 3.7 mEq/L (ref 3.5–5.1)
Sodium: 138 mEq/L (ref 135–145)
Total Bilirubin: 0.4 mg/dL (ref 0.2–1.2)
Total Protein: 6.5 g/dL (ref 6.0–8.3)

## 2023-01-07 LAB — CBC WITH DIFFERENTIAL/PLATELET
Basophils Absolute: 0.1 10*3/uL (ref 0.0–0.1)
Basophils Relative: 1 % (ref 0.0–3.0)
Eosinophils Absolute: 0 10*3/uL (ref 0.0–0.7)
Eosinophils Relative: 0.1 % (ref 0.0–5.0)
HCT: 39.2 % (ref 36.0–46.0)
Hemoglobin: 13.2 g/dL (ref 12.0–15.0)
Lymphocytes Relative: 11.4 % — ABNORMAL LOW (ref 12.0–46.0)
Lymphs Abs: 1.7 10*3/uL (ref 0.7–4.0)
MCHC: 33.6 g/dL (ref 30.0–36.0)
MCV: 95.2 fl (ref 78.0–100.0)
Monocytes Absolute: 0.5 10*3/uL (ref 0.1–1.0)
Monocytes Relative: 3.4 % (ref 3.0–12.0)
Neutro Abs: 12.7 10*3/uL — ABNORMAL HIGH (ref 1.4–7.7)
Neutrophils Relative %: 84.1 % — ABNORMAL HIGH (ref 43.0–77.0)
Platelets: 259 10*3/uL (ref 150.0–400.0)
RBC: 4.12 Mil/uL (ref 3.87–5.11)
RDW: 14.3 % (ref 11.5–15.5)
WBC: 15.1 10*3/uL — ABNORMAL HIGH (ref 4.0–10.5)

## 2023-01-07 LAB — CEA: CEA: 2 ng/mL

## 2023-01-07 LAB — C-REACTIVE PROTEIN: CRP: 1.3 mg/dL (ref 0.5–20.0)

## 2023-01-07 LAB — CANCER ANTIGEN 19-9: CA 19-9: 14 U/mL (ref <34)

## 2023-01-12 ENCOUNTER — Other Ambulatory Visit: Payer: Medicare PPO

## 2023-01-12 DIAGNOSIS — D649 Anemia, unspecified: Secondary | ICD-10-CM

## 2023-01-12 DIAGNOSIS — K869 Disease of pancreas, unspecified: Secondary | ICD-10-CM

## 2023-01-12 DIAGNOSIS — R1030 Lower abdominal pain, unspecified: Secondary | ICD-10-CM | POA: Diagnosis not present

## 2023-01-12 DIAGNOSIS — R197 Diarrhea, unspecified: Secondary | ICD-10-CM

## 2023-01-13 ENCOUNTER — Other Ambulatory Visit: Payer: Self-pay | Admitting: Gastroenterology

## 2023-01-13 ENCOUNTER — Telehealth: Payer: Self-pay

## 2023-01-13 DIAGNOSIS — A498 Other bacterial infections of unspecified site: Secondary | ICD-10-CM

## 2023-01-13 MED ORDER — VANCOMYCIN HCL 125 MG PO CAPS: 125.0000 mg | ORAL_CAPSULE | Freq: Four times a day (QID) | ORAL | 0 refills | Status: AC

## 2023-01-13 NOTE — Telephone Encounter (Signed)
Pt positive for C-diff. Please advise.

## 2023-01-13 NOTE — Progress Notes (Signed)
+   cdiff  Sent vancomycin 125 PO QID x 10 days

## 2023-01-13 NOTE — Telephone Encounter (Signed)
Called in vancomycin 125 4 times daily for 10 days  I have also informed patient's husband RG

## 2023-01-14 NOTE — Telephone Encounter (Signed)
Inbound call from patient's husband, states pharmacy is requiring a prior authorization for vancomycin, since it is very costly without.

## 2023-01-17 LAB — CALPROTECTIN, FECAL: Calprotectin, Fecal: 141 ug/g — ABNORMAL HIGH (ref 0–120)

## 2023-01-18 ENCOUNTER — Telehealth: Payer: Self-pay | Admitting: Pharmacy Technician

## 2023-01-18 ENCOUNTER — Other Ambulatory Visit (HOSPITAL_COMMUNITY): Payer: Self-pay

## 2023-01-18 NOTE — Telephone Encounter (Signed)
Patient Advocate Encounter  Prior Authorization for Vancomycin HCl 125MG capsules has been approved through [employer].    Key: [id number]  Effective: 01-18-2023 to 09-21-2023  

## 2023-01-18 NOTE — Telephone Encounter (Signed)
Pt husband Jomarie Longs  made aware.  Jomarie Longs verbalized understanding with all questions answered.

## 2023-01-18 NOTE — Telephone Encounter (Signed)
Patient Advocate Encounter  Received notification from [facility] that prior authorization for VANCOMYCIN 125MG  is required.   PA submitted on 4.29.24 [facility] Status is pending

## 2023-01-18 NOTE — Telephone Encounter (Signed)
Pt husband made aware.

## 2023-01-18 NOTE — Telephone Encounter (Signed)
Patient Advocate Encounter  Prior Authorization for Vancomycin HCl 125MG  capsules has been approved through [employer].    KeyWylie Hail  Effective: 01-18-2023 to 09-21-2023

## 2023-01-19 LAB — PANCREATIC ELASTASE, FECAL: Pancreatic Elastase-1, Stool: 141 mcg/g — ABNORMAL LOW

## 2023-01-21 ENCOUNTER — Ambulatory Visit (HOSPITAL_BASED_OUTPATIENT_CLINIC_OR_DEPARTMENT_OTHER): Payer: Medicare PPO | Admitting: Pulmonary Disease

## 2023-01-24 DIAGNOSIS — J9611 Chronic respiratory failure with hypoxia: Secondary | ICD-10-CM | POA: Diagnosis not present

## 2023-01-26 IMAGING — CR DG CHEST 2V
2 series · 2 of 2 positions shown · non-contrast
Comparison: Chest x-ray and chest CT 02/17/2019

CLINICAL DATA: Shortness of breath since COVID diagnosis in February 2021

EXAM:
CHEST - 2 VIEW

[w chest pa]
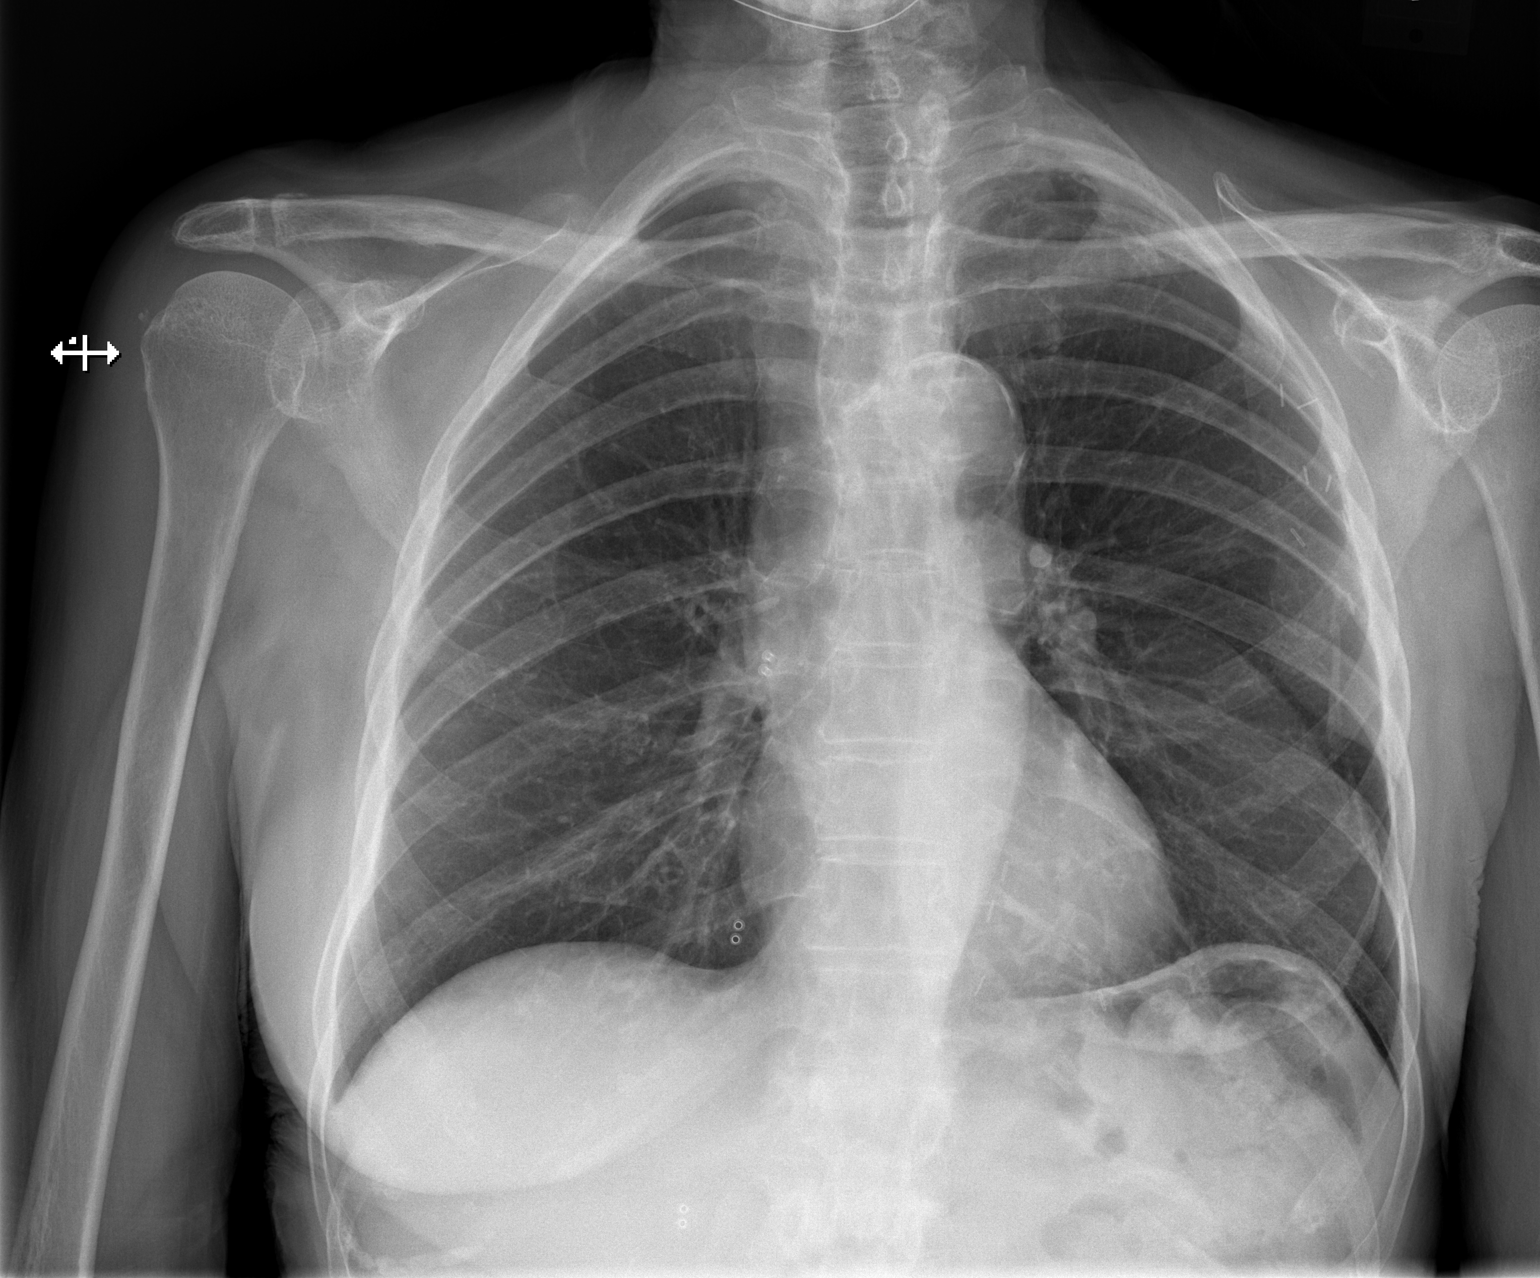

[w chest lat]
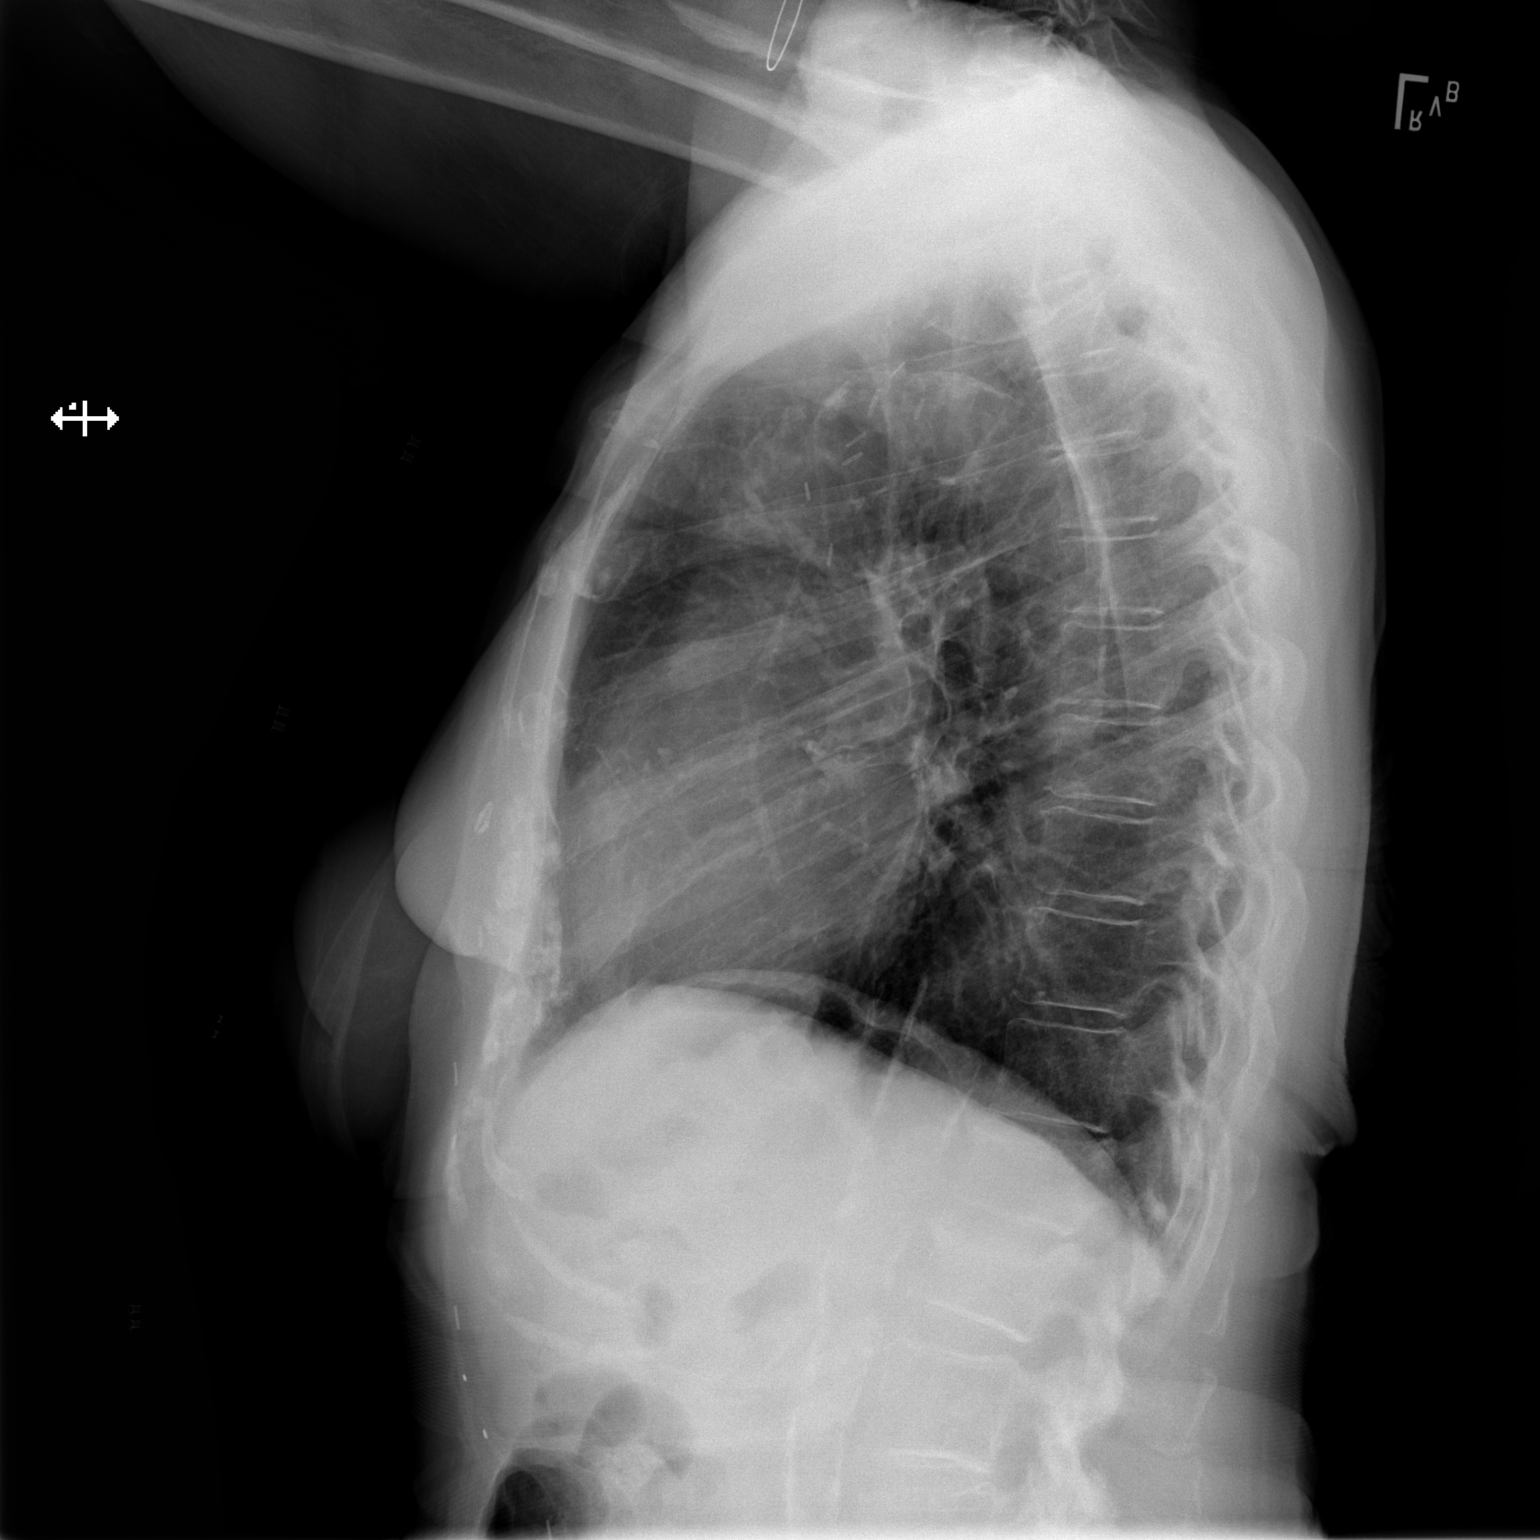

[2 of 2 positions shown; findings below may reference images not displayed]

FINDINGS: The cardiac silhouette, mediastinal and hilar contours are within
normal limits. Mild tortuosity and calcification of the thoracic
aorta noted.

No acute pulmonary findings. No pulmonary lesions. No pleural
effusions.

Surgical changes from a partial left mastectomy.

The bony thorax is intact.
IMPRESSION: No acute cardiopulmonary findings.

## 2023-01-27 ENCOUNTER — Ambulatory Visit (HOSPITAL_COMMUNITY)
Admission: RE | Admit: 2023-01-27 | Discharge: 2023-01-27 | Disposition: A | Discharge status: Home / Self Care | Payer: Medicare PPO | Source: Ambulatory Visit | Attending: Gastroenterology | Admitting: Gastroenterology

## 2023-01-27 DIAGNOSIS — D649 Anemia, unspecified: Secondary | ICD-10-CM | POA: Insufficient documentation

## 2023-01-27 DIAGNOSIS — K869 Disease of pancreas, unspecified: Secondary | ICD-10-CM | POA: Insufficient documentation

## 2023-01-27 DIAGNOSIS — R1030 Lower abdominal pain, unspecified: Secondary | ICD-10-CM | POA: Insufficient documentation

## 2023-01-27 DIAGNOSIS — R197 Diarrhea, unspecified: Secondary | ICD-10-CM | POA: Insufficient documentation

## 2023-01-27 MED ORDER — IOHEXOL 300 MG/ML  SOLN
75.0000 mL | Freq: Once | INTRAMUSCULAR | Status: AC | PRN
  Administered 2023-01-27: 75 mL via INTRAVENOUS

## 2023-01-27 MED ORDER — SODIUM CHLORIDE (PF) 0.9 % IJ SOLN
INTRAMUSCULAR | Status: AC
  Filled 2023-01-27: qty 50

## 2023-01-27 MED ORDER — IOHEXOL 9 MG/ML PO SOLN: 500.0000 mL | ORAL | Status: AC

## 2023-01-27 MED ORDER — IOHEXOL 9 MG/ML PO SOLN
ORAL | Status: AC
  Filled 2023-01-27: qty 1000

## 2023-01-28 ENCOUNTER — Encounter: Payer: Self-pay | Admitting: Gastroenterology

## 2023-01-29 ENCOUNTER — Telehealth: Payer: Self-pay | Admitting: Gastroenterology

## 2023-01-29 NOTE — Telephone Encounter (Signed)
Chart reviewed. CT results still pending.

## 2023-01-29 NOTE — Telephone Encounter (Signed)
Patient's husband called regarding recent CT results. Requesting a call back once results have been reviewed. Please advise, thank you.

## 2023-02-01 ENCOUNTER — Telehealth: Payer: Self-pay

## 2023-02-01 NOTE — Telephone Encounter (Signed)
Dr. Gupta Pt.  Please see notes below. Please advise as DOD 

## 2023-02-01 NOTE — Telephone Encounter (Signed)
Dr. Chales Abrahams Pt.  Please see notes below. Please advise as DOD

## 2023-02-01 NOTE — Telephone Encounter (Signed)
Inbound call from patient's husband, calling to follow up on results. States he received them through MyChart, patient was advised we need provider's recommendations prior to discussing results. Are anxiously awaiting results discussed.

## 2023-02-02 NOTE — Telephone Encounter (Signed)
Pt and Pt husband made aware of recent results: Please advise Dr. Chales Abrahams in regard to .  Recommendation from the Radiologist is for repeat CT or MRI Pancreas protocol with/without contrast in 1 year: Pt has previously scheduled office visit on 02/23/2023 at 8:50 AM. Pt and Husband is aware. Pt verbalized understanding with all questions answered.

## 2023-02-02 NOTE — Telephone Encounter (Signed)
Unable to reach pt. Phone rings continuously without answering machine.

## 2023-02-14 DIAGNOSIS — J449 Chronic obstructive pulmonary disease, unspecified: Secondary | ICD-10-CM | POA: Diagnosis not present

## 2023-02-22 ENCOUNTER — Telehealth: Payer: Self-pay | Admitting: Gastroenterology

## 2023-02-22 NOTE — Telephone Encounter (Signed)
Left message for patient's husband to call back.  

## 2023-02-22 NOTE — Telephone Encounter (Signed)
PT and husband called in about her symptoms. She is scheduled to come in tomorrow but she is very weak and concerned that she wont be able to come and wants to have some options. Please advise.

## 2023-02-23 ENCOUNTER — Ambulatory Visit: Payer: Medicare PPO | Admitting: Gastroenterology

## 2023-02-23 NOTE — Telephone Encounter (Signed)
Spoke with patient's husband & he stated patient has been very weak and has been resting in the bed for several days now. She is still having ongoing abdominal pain & poor appetite. Recently treated back in April for cdiff & completed medication. She has an appointment scheduled for today with Dr. Chales Abrahams for a follow up & to discuss CT results, however husband says she is too weak to make it to the appointment. Advised him that patient should go to urgent care/ED to be evaluated given her symptoms. Husband verbalized all understanding. He would like to know if Dr. Chales Abrahams can call him when he has availability to discuss CT results. They were previously discussed with RN, however he would like to hear it from the doctor.

## 2023-02-24 ENCOUNTER — Inpatient Hospital Stay (HOSPITAL_COMMUNITY)
Admission: EM | Admit: 2023-02-24 | Discharge: 2023-02-26 | DRG: 372 | Disposition: A | Discharge status: Home / Self Care | Payer: Medicare PPO | Attending: Internal Medicine | Admitting: Internal Medicine

## 2023-02-24 ENCOUNTER — Other Ambulatory Visit: Payer: Self-pay

## 2023-02-24 ENCOUNTER — Emergency Department (HOSPITAL_COMMUNITY): Payer: Medicare PPO

## 2023-02-24 DIAGNOSIS — Z8616 Personal history of COVID-19: Secondary | ICD-10-CM | POA: Diagnosis not present

## 2023-02-24 DIAGNOSIS — E785 Hyperlipidemia, unspecified: Secondary | ICD-10-CM | POA: Diagnosis present

## 2023-02-24 DIAGNOSIS — Z7983 Long term (current) use of bisphosphonates: Secondary | ICD-10-CM | POA: Diagnosis not present

## 2023-02-24 DIAGNOSIS — A0472 Enterocolitis due to Clostridium difficile, not specified as recurrent: Secondary | ICD-10-CM | POA: Diagnosis not present

## 2023-02-24 DIAGNOSIS — E46 Unspecified protein-calorie malnutrition: Secondary | ICD-10-CM | POA: Diagnosis present

## 2023-02-24 DIAGNOSIS — A0471 Enterocolitis due to Clostridium difficile, recurrent: Secondary | ICD-10-CM | POA: Diagnosis not present

## 2023-02-24 DIAGNOSIS — E8809 Other disorders of plasma-protein metabolism, not elsewhere classified: Secondary | ICD-10-CM | POA: Diagnosis not present

## 2023-02-24 DIAGNOSIS — Z7984 Long term (current) use of oral hypoglycemic drugs: Secondary | ICD-10-CM

## 2023-02-24 DIAGNOSIS — E860 Dehydration: Secondary | ICD-10-CM | POA: Diagnosis present

## 2023-02-24 DIAGNOSIS — J45909 Unspecified asthma, uncomplicated: Secondary | ICD-10-CM | POA: Diagnosis present

## 2023-02-24 DIAGNOSIS — Z853 Personal history of malignant neoplasm of breast: Secondary | ICD-10-CM | POA: Diagnosis not present

## 2023-02-24 DIAGNOSIS — I10 Essential (primary) hypertension: Secondary | ICD-10-CM | POA: Diagnosis present

## 2023-02-24 DIAGNOSIS — R197 Diarrhea, unspecified: Principal | ICD-10-CM

## 2023-02-24 DIAGNOSIS — R339 Retention of urine, unspecified: Secondary | ICD-10-CM | POA: Diagnosis present

## 2023-02-24 DIAGNOSIS — N281 Cyst of kidney, acquired: Secondary | ICD-10-CM | POA: Diagnosis not present

## 2023-02-24 DIAGNOSIS — K862 Cyst of pancreas: Secondary | ICD-10-CM | POA: Diagnosis not present

## 2023-02-24 DIAGNOSIS — Z79890 Hormone replacement therapy (postmenopausal): Secondary | ICD-10-CM | POA: Diagnosis not present

## 2023-02-24 DIAGNOSIS — R1032 Left lower quadrant pain: Secondary | ICD-10-CM | POA: Diagnosis not present

## 2023-02-24 DIAGNOSIS — F419 Anxiety disorder, unspecified: Secondary | ICD-10-CM | POA: Diagnosis present

## 2023-02-24 DIAGNOSIS — E039 Hypothyroidism, unspecified: Secondary | ICD-10-CM | POA: Diagnosis not present

## 2023-02-24 DIAGNOSIS — R195 Other fecal abnormalities: Secondary | ICD-10-CM | POA: Diagnosis present

## 2023-02-24 DIAGNOSIS — D84821 Immunodeficiency due to drugs: Secondary | ICD-10-CM | POA: Diagnosis not present

## 2023-02-24 DIAGNOSIS — Z9012 Acquired absence of left breast and nipple: Secondary | ICD-10-CM

## 2023-02-24 DIAGNOSIS — R262 Difficulty in walking, not elsewhere classified: Secondary | ICD-10-CM | POA: Diagnosis present

## 2023-02-24 DIAGNOSIS — K219 Gastro-esophageal reflux disease without esophagitis: Secondary | ICD-10-CM | POA: Diagnosis present

## 2023-02-24 DIAGNOSIS — R531 Weakness: Secondary | ICD-10-CM

## 2023-02-24 DIAGNOSIS — Z7951 Long term (current) use of inhaled steroids: Secondary | ICD-10-CM

## 2023-02-24 DIAGNOSIS — R627 Adult failure to thrive: Secondary | ICD-10-CM | POA: Diagnosis present

## 2023-02-24 DIAGNOSIS — Z923 Personal history of irradiation: Secondary | ICD-10-CM

## 2023-02-24 DIAGNOSIS — R338 Other retention of urine: Secondary | ICD-10-CM | POA: Diagnosis present

## 2023-02-24 DIAGNOSIS — Z888 Allergy status to other drugs, medicaments and biological substances status: Secondary | ICD-10-CM

## 2023-02-24 DIAGNOSIS — Z885 Allergy status to narcotic agent status: Secondary | ICD-10-CM

## 2023-02-24 DIAGNOSIS — Z7952 Long term (current) use of systemic steroids: Secondary | ICD-10-CM

## 2023-02-24 DIAGNOSIS — K6389 Other specified diseases of intestine: Secondary | ICD-10-CM | POA: Diagnosis not present

## 2023-02-24 DIAGNOSIS — M353 Polymyalgia rheumatica: Secondary | ICD-10-CM | POA: Diagnosis present

## 2023-02-24 DIAGNOSIS — Z79899 Other long term (current) drug therapy: Secondary | ICD-10-CM

## 2023-02-24 DIAGNOSIS — K8689 Other specified diseases of pancreas: Secondary | ICD-10-CM | POA: Diagnosis not present

## 2023-02-24 DIAGNOSIS — T380X5A Adverse effect of glucocorticoids and synthetic analogues, initial encounter: Secondary | ICD-10-CM | POA: Diagnosis present

## 2023-02-24 DIAGNOSIS — R000 Tachycardia, unspecified: Secondary | ICD-10-CM | POA: Diagnosis not present

## 2023-02-24 DIAGNOSIS — K529 Noninfective gastroenteritis and colitis, unspecified: Secondary | ICD-10-CM | POA: Diagnosis not present

## 2023-02-24 DIAGNOSIS — Z9221 Personal history of antineoplastic chemotherapy: Secondary | ICD-10-CM | POA: Diagnosis not present

## 2023-02-24 DIAGNOSIS — J9611 Chronic respiratory failure with hypoxia: Secondary | ICD-10-CM | POA: Diagnosis not present

## 2023-02-24 LAB — LACTIC ACID, PLASMA
Lactic Acid, Venous: 1.4 mmol/L (ref 0.5–1.9)
Lactic Acid, Venous: 1.3 mmol/L (ref 0.5–1.9)

## 2023-02-24 LAB — CBC
HCT: 39.6 % (ref 36.0–46.0)
Hemoglobin: 13.3 g/dL (ref 12.0–15.0)
MCH: 32 pg (ref 26.0–34.0)
MCHC: 33.6 g/dL (ref 30.0–36.0)
MCV: 95.4 fL (ref 80.0–100.0)
Platelets: 207 10*3/uL (ref 150–400)
RBC: 4.15 MIL/uL (ref 3.87–5.11)
RDW: 13.5 % (ref 11.5–15.5)
WBC: 21 10*3/uL — ABNORMAL HIGH (ref 4.0–10.5)
nRBC: 0 % (ref 0.0–0.2)

## 2023-02-24 LAB — T4, FREE: Free T4: 1.62 ng/dL — ABNORMAL HIGH (ref 0.61–1.12)

## 2023-02-24 LAB — BASIC METABOLIC PANEL
Anion gap: 11 (ref 5–15)
BUN: 16 mg/dL (ref 8–23)
CO2: 22 mmol/L (ref 22–32)
Calcium: 8.5 mg/dL — ABNORMAL LOW (ref 8.9–10.3)
Chloride: 102 mmol/L (ref 98–111)
Creatinine, Ser: 0.94 mg/dL (ref 0.44–1.00)
GFR, Estimated: 60 mL/min — ABNORMAL LOW (ref >60)
Glucose, Bld: 157 mg/dL — ABNORMAL HIGH (ref 70–99)
Potassium: 3.7 mmol/L (ref 3.5–5.1)
Sodium: 135 mmol/L (ref 135–145)

## 2023-02-24 LAB — HEPATIC FUNCTION PANEL
ALT: 28 U/L (ref 0–44)
AST: 21 U/L (ref 15–41)
Albumin: 2.7 g/dL — ABNORMAL LOW (ref 3.5–5.0)
Alkaline Phosphatase: 43 U/L (ref 38–126)
Bilirubin, Direct: 0.1 mg/dL (ref 0.0–0.2)
Indirect Bilirubin: 0.9 mg/dL (ref 0.3–0.9)
Total Bilirubin: 1 mg/dL (ref 0.3–1.2)
Total Protein: 5.7 g/dL — ABNORMAL LOW (ref 6.5–8.1)

## 2023-02-24 LAB — TSH: TSH: 1.131 u[IU]/mL (ref 0.350–4.500)

## 2023-02-24 LAB — GLUCOSE, CAPILLARY: Glucose-Capillary: 109 mg/dL — ABNORMAL HIGH (ref 70–99)

## 2023-02-24 LAB — C DIFFICILE QUICK SCREEN W PCR REFLEX
C Diff antigen: POSITIVE — AB
C Diff interpretation: DETECTED
C Diff toxin: POSITIVE — AB

## 2023-02-24 LAB — LIPASE, BLOOD: Lipase: 26 U/L (ref 11–51)

## 2023-02-24 LAB — C-REACTIVE PROTEIN: CRP: 13.1 mg/dL — ABNORMAL HIGH (ref <1.0)

## 2023-02-24 LAB — POC OCCULT BLOOD, ED: Fecal Occult Bld: POSITIVE — AB

## 2023-02-24 MED ORDER — ACETAMINOPHEN 650 MG RE SUPP: 650.0000 mg | Freq: Four times a day (QID) | RECTAL | Status: DC | PRN

## 2023-02-24 MED ORDER — OXYCODONE HCL 5 MG PO TABS: 5.0000 mg | ORAL_TABLET | ORAL | Status: DC | PRN

## 2023-02-24 MED ORDER — ACETAMINOPHEN 325 MG PO TABS: 650.0000 mg | ORAL_TABLET | Freq: Four times a day (QID) | ORAL | Status: DC | PRN

## 2023-02-24 MED ORDER — CARVEDILOL 6.25 MG PO TABS
6.2500 mg | ORAL_TABLET | Freq: Two times a day (BID) | ORAL | Status: DC
  Administered 2023-02-24 – 2023-02-26 (×4): 6.25 mg via ORAL
  Filled 2023-02-24 (×4): qty 1

## 2023-02-24 MED ORDER — HYDRALAZINE HCL 20 MG/ML IJ SOLN: 10.0000 mg | Freq: Four times a day (QID) | INTRAMUSCULAR | Status: DC | PRN

## 2023-02-24 MED ORDER — ALBUTEROL SULFATE (2.5 MG/3ML) 0.083% IN NEBU
2.5000 mg | INHALATION_SOLUTION | Freq: Three times a day (TID) | RESPIRATORY_TRACT | Status: DC
  Administered 2023-02-25 – 2023-02-26 (×4): 2.5 mg via RESPIRATORY_TRACT
  Filled 2023-02-24 (×5): qty 3

## 2023-02-24 MED ORDER — PRAVASTATIN SODIUM 20 MG PO TABS
20.0000 mg | ORAL_TABLET | Freq: Every day | ORAL | Status: DC
  Administered 2023-02-24 – 2023-02-26 (×3): 20 mg via ORAL
  Filled 2023-02-24 (×3): qty 1

## 2023-02-24 MED ORDER — SODIUM CHLORIDE 0.9 % IV SOLN: INTRAVENOUS | Status: DC

## 2023-02-24 MED ORDER — INSULIN ASPART 100 UNIT/ML IJ SOLN: 0.0000 [IU] | Freq: Every day | INTRAMUSCULAR | Status: DC

## 2023-02-24 MED ORDER — IOHEXOL 300 MG/ML  SOLN
100.0000 mL | Freq: Once | INTRAMUSCULAR | Status: AC | PRN
  Administered 2023-02-24: 100 mL via INTRAVENOUS

## 2023-02-24 MED ORDER — LEVOTHYROXINE SODIUM 88 MCG PO TABS
88.0000 ug | ORAL_TABLET | Freq: Every day | ORAL | Status: DC
  Administered 2023-02-25 – 2023-02-26 (×2): 88 ug via ORAL
  Filled 2023-02-24 (×2): qty 1

## 2023-02-24 MED ORDER — SODIUM CHLORIDE 0.9 % IV BOLUS
1000.0000 mL | Freq: Once | INTRAVENOUS | Status: AC
  Administered 2023-02-24: 1000 mL via INTRAVENOUS

## 2023-02-24 MED ORDER — ALBUTEROL SULFATE (2.5 MG/3ML) 0.083% IN NEBU
2.5000 mg | INHALATION_SOLUTION | Freq: Four times a day (QID) | RESPIRATORY_TRACT | Status: DC
  Administered 2023-02-24: 2.5 mg via RESPIRATORY_TRACT
  Filled 2023-02-24: qty 3

## 2023-02-24 MED ORDER — BUDESONIDE 0.5 MG/2ML IN SUSP: 0.5000 mg | Freq: Two times a day (BID) | RESPIRATORY_TRACT | Status: DC | PRN

## 2023-02-24 MED ORDER — INSULIN ASPART 100 UNIT/ML IJ SOLN
0.0000 [IU] | Freq: Three times a day (TID) | INTRAMUSCULAR | Status: DC
  Administered 2023-02-25: 2 [IU] via SUBCUTANEOUS
  Administered 2023-02-26: 3 [IU] via SUBCUTANEOUS

## 2023-02-24 MED ORDER — VANCOMYCIN HCL 125 MG PO CAPS: 125.0000 mg | ORAL_CAPSULE | Freq: Four times a day (QID) | ORAL | Status: DC

## 2023-02-24 MED ORDER — FIDAXOMICIN 200 MG PO TABS
200.0000 mg | ORAL_TABLET | Freq: Two times a day (BID) | ORAL | Status: DC
  Administered 2023-02-24 – 2023-02-25 (×2): 200 mg via ORAL
  Filled 2023-02-24 (×3): qty 1

## 2023-02-24 MED ORDER — ENOXAPARIN SODIUM 40 MG/0.4ML IJ SOSY
40.0000 mg | PREFILLED_SYRINGE | INTRAMUSCULAR | Status: DC
  Administered 2023-02-24 – 2023-02-25 (×2): 40 mg via SUBCUTANEOUS
  Filled 2023-02-24 (×2): qty 0.4

## 2023-02-24 NOTE — Telephone Encounter (Signed)
Discussed with patient's husband and patient in detail Failure to thrive Continued diarrhea Feeling very weak  CT Abdo/pelvis negative for any acute abnormalities.  Stable 2.5 cm pancreatic cyst  Plan: -Needs to come over to [facility] ED.  -She will need CBC, CMP, CRP -Stool studies including GI pathogens, C. Difficile -IV fluids -Would likely need admission for further GI eval  I have also talked with Dr. Estell Harpin in ED Also send msg to Oleta Mouse [facility] adm- pt's daughter in law)

## 2023-02-24 NOTE — ED Notes (Signed)
ED Provider at bedside. 

## 2023-02-24 NOTE — ED Notes (Signed)
Did speak with pharmacy via chat. States that they have to speak with GI doc/possibly wait on stools regarding verifying next vancomycin dose. Agreed.

## 2023-02-24 NOTE — ED Triage Notes (Signed)
Pt sent by GI for failure to thrive, lethargy.  Pt states has been lying in bed x 7 days.  "Just feels so weak"  Pt is ill appearing at time of triage.

## 2023-02-24 NOTE — ED Notes (Signed)
ED TO INPATIENT HANDOFF REPORT  Name/Age/Gender Jodi Valdez 87 y.o. female  Code Status    Code Status Orders  (From admission, onward)           Start     Ordered   02/24/23 1628  Full code  Continuous       Question:  By:  Answer:  Consent: discussion documented in EHR   02/24/23 1628           Code Status History     This patient has a current code status but no historical code status.       Home/SNF/Other Home  Chief Complaint Acute colitis [K52.9]  Level of Care/Admitting Diagnosis ED Disposition     ED Disposition  Admit   Condition  --   Comment  Hospital Area: [facility] [[id number]]  Level of Care: Med-Surg [16]  May admit patient to [facility] or [facility] if equivalent level of care is available:: Yes  Covid Evaluation: Asymptomatic - no recent exposure (last 10 days) testing not required  Diagnosis: Acute colitis [308657]  Admitting Physician: Lorin Glass [[id number]]  Attending Physician: Lorin Glass [[id number]]  Certification:: I certify this patient will need inpatient services for at least 2 midnights  Estimated Length of Stay: 2          Medical History Past Medical History:  Diagnosis Date   Allergy    Anemia    Asthma    Breast cancer (HCC)    Cancer (HCC) 11/01/2000   left breast cancer   GERD (gastroesophageal reflux disease)    Heart disease    Hyperlipidemia    Hypertension    Long COVID    Personal history of chemotherapy    2002   Personal history of radiation therapy    2002   Polymyalgia (HCC)     Allergies Allergies  Allergen Reactions   Propranolol Other (See Comments)    Hallucinations    Gabapentin Other (See Comments)    Hallucinations   Codeine Nausea And Vomiting   Nitrofuran Derivatives Nausea And Vomiting and Other (See Comments)    GI Intolerance    IV Location/Drains/Wounds Patient Lines/Drains/Airways Status     Active Line/Drains/Airways     Name  Placement date Placement time Site Days   Peripheral IV 02/24/23 20 G Anterior;Right Forearm 02/24/23  1251  Forearm  less than 1            Labs/Imaging Results for orders placed or performed during the hospital encounter of 02/24/23 (from the past 48 hour(s))  Basic metabolic panel     Status: Abnormal   Collection Time: 02/24/23 12:16 PM  Result Value Ref Range   Sodium 135 135 - 145 mmol/L   Potassium 3.7 3.5 - 5.1 mmol/L   Chloride 102 98 - 111 mmol/L   CO2 22 22 - 32 mmol/L   Glucose, Bld 157 (H) 70 - 99 mg/dL    Comment: Glucose reference range applies only to samples taken after fasting for at least 8 hours.   BUN 16 8 - 23 mg/dL   Creatinine, Ser 4.40 0.44 - 1.00 mg/dL   Calcium 8.5 (L) 8.9 - 10.3 mg/dL   GFR, Estimated 60 (L) >60 mL/min    Comment: (NOTE) Calculated using the CKD-EPI Creatinine Equation (2021)    Anion gap 11 5 - 15    Comment: Performed at [facility], [address], [place], [place] [address]  CBC  Status: Abnormal   Collection Time: 02/24/23 12:16 PM  Result Value Ref Range   WBC 21.0 (H) 4.0 - 10.5 K/uL   RBC 4.15 3.87 - 5.11 MIL/uL   Hemoglobin 13.3 12.0 - 15.0 g/dL   HCT 16.1 09.6 - 04.5 %   MCV 95.4 80.0 - 100.0 fL   MCH 32.0 26.0 - 34.0 pg   MCHC 33.6 30.0 - 36.0 g/dL   RDW 40.9 81.1 - 91.4 %   Platelets 207 150 - 400 K/uL   nRBC 0.0 0.0 - 0.2 %    Comment: Performed at [facility], [address], [place], [place] [address]  Hepatic function panel     Status: Abnormal   Collection Time: 02/24/23 12:16 PM  Result Value Ref Range   Total Protein 5.7 (L) 6.5 - 8.1 g/dL   Albumin 2.7 (L) 3.5 - 5.0 g/dL   AST 21 15 - 41 U/L   ALT 28 0 - 44 U/L   Alkaline Phosphatase 43 38 - 126 U/L   Total Bilirubin 1.0 0.3 - 1.2 mg/dL   Bilirubin, Direct 0.1 0.0 - 0.2 mg/dL   Indirect Bilirubin 0.9 0.3 - 0.9 mg/dL    Comment: Performed at [facility], [address], [place],  [place] [address]  Lipase, blood     Status: None   Collection Time: 02/24/23 12:16 PM  Result Value Ref Range   Lipase 26 11 - 51 U/L    Comment: Performed at [facility], [address], [place], [place] [address]  TSH     Status: None   Collection Time: 02/24/23 12:18 PM  Result Value Ref Range   TSH 1.131 0.350 - 4.500 uIU/mL    Comment: Performed by a 3rd Generation assay with a functional sensitivity of <=0.01 uIU/mL. Performed at [facility], [address], [place], [place] [address]   Lactic acid, plasma     Status: None   Collection Time: 02/24/23 12:28 PM  Result Value Ref Range   Lactic Acid, Venous 1.4 0.5 - 1.9 mmol/L    Comment: Performed at [facility], [address], [place], [place] [address]  POC occult blood, ED Provider will collect     Status: Abnormal   Collection Time: 02/24/23  1:57 PM  Result Value Ref Range   Fecal Occult Bld POSITIVE (A) NEGATIVE   CT ABDOMEN PELVIS W CONTRAST  Result Date: 02/24/2023 CLINICAL DATA:  Failure to thrive, lethargy, and left lower quadrant abdominal pain. Recent diagnosis of C diff colitis status post treatment. EXAM: CT ABDOMEN AND PELVIS WITH CONTRAST TECHNIQUE: Multidetector CT imaging of the abdomen and pelvis was performed using the standard protocol following bolus administration of intravenous contrast. RADIATION DOSE REDUCTION: This exam was performed according to the departmental dose-optimization program which includes automated exposure control, adjustment of the mA and/or kV according to patient size and/or use of iterative reconstruction technique. CONTRAST:  OMNIPAQUE IOHEXOL 300 MG/ML  SOLN COMPARISON:  CT abdomen and pelvis dated 01/27/2023 FINDINGS: Lower chest: No focal consolidation or pulmonary nodule in the lung bases. No pleural effusion or pneumothorax demonstrated. Partially imaged heart size is normal. Hepatobiliary: No focal hepatic lesions. No intra or  extrahepatic biliary ductal dilation. Normal gallbladder. 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 An adjacent cystic focus is also unchanged from recent studies but slowly enlarging in size from more remote studies. No main ductal dilation, mass lesion, or abnormal enhancement. Spleen: Normal in size without focal abnormality. Adrenals/Urinary Tract: No adrenal nodules. No suspicious renal mass, calculi or hydronephrosis. Pole simple left lower cyst. No specific follow-up imaging recommended. No focal bladder wall thickening. Stomach/Bowel: Normal appearance of the stomach. Mural thickening and mucosal hyperenhancement involving nearly the entire colon. No abnormal bowel dilation. Appendix is not discretely seen. Vascular/Lymphatic: Aortic atherosclerosis. No enlarged abdominal or pelvic lymph nodes. Reproductive: No adnexal masses. Other: No free fluid, fluid collection, or free air. Musculoskeletal: No acute or abnormal lytic or blastic osseous lesions. Multilevel degenerative changes of the partially imaged thoracic and lumbar spine. Unchanged compression deformities of L1 and L2. Spine. IMPRESSION: 1. Mural thickening and mucosal hyperenhancement involving nearly the entire colon, consistent with colitis. 2. 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 An adjacent cystic focus is also unchanged from recent studies but slowly enlarging in size from more remote studies. EUS/FNA can be considered given interval growth, however should be anchored to the patient's  overall health and preferences and only if the patient is a surgical candidate. This recommendation follows ACR consensus guidelines: Management of Incidental Pancreatic Cysts: A White Paper of the ACR Incidental Findings Committee. J Am Coll Radiol 2017;14:911-923. 3.  Aortic Atherosclerosis (ICD10-I70.0). Electronically Signed   By: Agustin Cree M.D.   On: 02/24/2023 15:58    Pending Labs Unresulted Labs (From admission, onward)     Start     Ordered   02/24/23 1312  C-reactive protein  Once,   URGENT        02/24/23 1311   02/24/23 1251  Gastrointestinal Panel by PCR , Stool  (Gastrointestinal Panel by PCR, Stool                                                                                                                                                     **Does Not include CLOSTRIDIUM DIFFICILE testing. **If CDIFF testing is needed, place order from the "C Difficile Testing" order set.**)  Once,   URGENT        02/24/23 1250   02/24/23 1251  C Difficile Quick Screen w PCR reflex  (C Difficile quick screen w PCR reflex panel )  Once, for 24 hours,   URGENT       References:    CDiff Information Tool   02/24/23 1250   02/24/23 1219  T4, free  Once,   URGENT  02/24/23 1218   02/24/23 1218  Lactic acid, plasma  Now then every 2 hours,   R      02/24/23 1217   02/24/23 1218  Blood culture (routine x 2)  BLOOD CULTURE X 2,   R      02/24/23 1217   02/24/23 1216  Urinalysis, Routine w reflex microscopic -Urine, Clean Catch  Once,   URGENT       Question:  Specimen Source  Answer:  Urine, Clean Catch   02/24/23 1215   Signed and Held  Hemoglobin A1c  Add-on,   R       Comments: To assess prior glycemic control    Signed and Held   Signed and Held  CBC  Tomorrow morning,   R        Signed and Held   Signed and Held  Phosphorus  Tomorrow morning,   R        Signed and Held   Signed and Held  Magnesium  Tomorrow morning,   R        Signed and Held   Signed and Held  Comprehensive  metabolic panel  Tomorrow morning,   R        Signed and Held            Vitals/Pain Today's Vitals   02/24/23 1245 02/24/23 1251 02/24/23 1315 02/24/23 1625  BP: 131/86 131/86 130/74 (!) 142/97  Pulse: (!) 114 (!) 113 (!) 102 (!) 116  Resp: (!) 21 20 13 19   Temp:  98.6 F (37 C)  98.9 F (37.2 C)  TempSrc:  Rectal  Oral  SpO2: 97% 99% 99% 99%  PainSc:        Isolation Precautions Contact precautions  Medications Medications  sodium chloride 0.9 % bolus 1,000 mL (1,000 mLs Intravenous New Bag/Given 02/24/23 1257)  iohexol (OMNIPAQUE) 300 MG/ML solution 100 mL (100 mLs Intravenous Contrast Given 02/24/23 1503)    Mobility walks with person assist

## 2023-02-24 NOTE — ED Notes (Signed)
Rectal exam performed by Rayfield Citizen, PA with this RN

## 2023-02-24 NOTE — Consult Note (Addendum)
Consultation  Referring Provider:  [facility]  Primary Care Physician:  Ailene Ravel, MD Primary Gastroenterologist:  Dr. Chales Abrahams       Reason for Consultation:   Persistent Diarrhea, failure to thrive    LOS: 0 days          HPI:   Jodi Valdez is a 87 y.o. female with past medical history significant for asthma, left breast cancer s/p radiation/chemo and mastectomy (2002), polymyalgia, presents for evaluation of diarrhea and failure to thrive.  Patient seen 4/17 by Dr. Chales Abrahams in office.  At that time patient was having diarrhea, mucus in stools, some fecal incontinence and lower cramping abdominal pain. Studies done 4/17 -CA 19-9 14 -CEA 2.0 -CRP 1.3 -Pancreatic elastase 141 -Fecal calprotectin 141 - GI pathogen panel positive for C diff. Patient was provided with Vancomycin 125 4 times daily for 10 days. -CT abdomen pelvis 01/27/2023 showed normal liver, gallbladder, and GI tract.  Suspected rectal prolapse.  Stable 2.5 cm cyst in the pancreatic head which has been seen on previous imaging and consistent with IPMN dating back to 06/2014.  Seen on PET/CT 07/2021 and measured 2.4 x 1.9 cm without FDG uptake  Patient was scheduled to follow up with Dr. Chales Abrahams in our office today, however, husband called and advised patient was too weak to make it to appt. Dr. Chales Abrahams advised for patient to come to ED for admission and further workup.  Husband presents and provides some of the history.  Patient and husband states her diarrhea has remained the same since initial visit 01/06/2023.  She completed her vancomycin on May 10 with no improvement in symptoms.  Continues to have 5 watery bowel movements per day.  States over the last week patient has continued to become more weak.  When she has a bowel movement she reports incomplete evacuation.  Has some nausea without vomiting.  Has lower abdominal cramping that gets better after bowel movement.  Had 1 episode of black stool after taking  Pepto-Bismol.  Reports poor appetite and poor oral intake.  Pertinent labs so far -WBC 21.0, elevated from 15.1 2-month ago -Adequate kidney function -TSH 1.13 -Fecal occult positive -Hgb 13.3 -Lactic acid 1.4 -Hepatic panel, CRP, Lipase pending -GI pathogen panel and C diff to be collected  PREVIOUS GI WORKUP---------------------------------------------------------- -EGD 06/05/2020 for epigastric pain and IDA: Presbyesophagus, erosive gastritis, single gastric polyp (hyperplastic), normal duodenum.  Negative for H. pylori  Colonoscopy 08/01/2014 for abdominal pain and constipation: Normal colonoscopy  Past Medical History:  Diagnosis Date   Allergy    Anemia    Asthma    Breast cancer (HCC)    Cancer (HCC) 11/01/2000   left breast cancer   GERD (gastroesophageal reflux disease)    Heart disease    Hyperlipidemia    Hypertension    Long COVID    Personal history of chemotherapy    2002   Personal history of radiation therapy    2002   Polymyalgia [facility])     Surgical History:  She  has a past surgical history that includes Breast surgery (Left, 11/01/2000); Cystocele repair (09/1999); Rectocele repair (09/1999); Abdominal hysterectomy (1975); Mastectomy (Left); and Colonoscopy (2015). Family History:  Her family history is not on file. Social History:   reports that she has never smoked. She has never used smokeless tobacco. She reports that she does not drink alcohol and does not use drugs.  Prior to Admission medications   Medication Sig Start  Date End Date Taking? Authorizing Provider  Acetylcysteine (NAC PO) Take 1 capsule by mouth daily.    [provider]  albuterol (VENTOLIN HFA) 108 (90 Base) MCG/ACT inhaler Inhale 2 puffs into the lungs every 6 (six) hours as needed for wheezing or shortness of breath. 08/20/21   Luciano Cutter, MD  alendronate (FOSAMAX) 70 MG tablet Take 70 mg by mouth once a week. 12/12/13   [provider]  amLODipine  (NORVASC) 5 MG tablet Take 5 mg by mouth 2 (two) times daily. 10/20/22   [provider]  ascorbic acid (VITAMIN C) 1000 MG tablet Take 1,000 mg by mouth daily. 06/17/20   [provider]  budesonide (PULMICORT) 0.5 MG/2ML nebulizer solution Use one vial in nebulizer twice daily as directed. 12/07/22   Kozlow, Alvira Philips, MD  carvedilol (COREG) 6.25 MG tablet Take 6.25 mg by mouth 2 (two) times daily with a meal.    [provider]  Cholecalciferol (VITAMIN D-3) 125 MCG (5000 UT) TABS Take 1 tablet by mouth 2 (two) times daily.    [provider]  EPINEPHrine 0.3 mg/0.3 mL IJ SOAJ injection Use as directed for life-threatening allergic reaction. Patient not taking: Reported on 01/06/2023 12/13/17   Jessica Priest, MD  ferrous sulfate 325 (65 FE) MG EC tablet Take 325 mg by mouth as needed.    [provider]  furosemide (LASIX) 40 MG tablet TAKE 1 TABLET BY MOUTH EVERY DAY 03/27/22   Georgeanna Lea, MD  ipratropium-albuterol (DUONEB) 0.5-2.5 (3) MG/3ML SOLN Take 3 mLs by nebulization every 6 (six) hours as needed. Patient taking differently: Take 3 mLs by nebulization every 6 (six) hours as needed (sob). 11/03/21   Luciano Cutter, MD  isosorbide mononitrate (IMDUR) 30 MG 24 hr tablet Take 1 tablet (30 mg total) by mouth daily. 11/09/22   Georgeanna Lea, MD  levocetirizine (XYZAL) 5 MG tablet Take 5 mg by mouth daily. 04/13/22   [provider]  levothyroxine (SYNTHROID) 88 MCG tablet Take 1 tablet (88 mcg total) by mouth daily. 10/30/22   Shamleffer, Konrad Dolores, MD  losartan (COZAAR) 50 MG tablet Take 50 mg by mouth daily.    [provider]  megestrol (MEGACE) 40 MG/ML suspension Take 400 mg by mouth every morning. 04/13/22   [provider]  metFORMIN (GLUCOPHAGE) 500 MG tablet Take 500 mg by mouth daily at 6 (six) AM.    [provider]  montelukast (SINGULAIR) 10 MG tablet TAKE 1 TABLET AT BEDTIME 10/26/22   Kozlow,  Alvira Philips, MD  Multiple Vitamins-Minerals (ONE DAILY MULTIVIT-MIN ADULT PO) Take 1 tablet by mouth daily.    [provider]  Multiple Vitamins-Minerals (PRESERVISION/LUTEIN PO) Take 1 tablet by mouth 2 (two) times daily. Unknown strength    [provider]  nitroGLYCERIN (NITROSTAT) 0.4 MG SL tablet Place 1 tablet (0.4 mg total) under the tongue every 5 (five) minutes as needed for chest pain. MAX 3 DOSES IN 15 MINUTES. IF STILL PAIN CALL 911 10/07/22   Georgeanna Lea, MD  OXYGEN Inhale 5 L/min into the lungs as needed.    [provider]  pantoprazole (PROTONIX) 40 MG tablet Take one tablet by mouth twice daily as directed. Patient taking differently: Take 40 mg by mouth 2 (two) times daily. Take one tablet by mouth twice daily as directed. 04/15/22   Kozlow, Alvira Philips, MD  potassium chloride SA (KLOR-CON M) 20 MEQ tablet Take 1 tablet (20 mEq  total) by mouth daily. 06/11/22   Georgeanna Lea, MD  pravastatin (PRAVACHOL) 20 MG tablet Take 20 mg by mouth daily. 04/03/21   [provider]  predniSONE (DELTASONE) 1 MG tablet Take 8 mg by mouth daily. 01/12/19   [provider]  predniSONE (DELTASONE) 5 MG tablet Take 5 mg by mouth daily. 03/16/22   [provider]  Spacer/Aero-Holding Rudean Curt As directed Patient taking differently: 1 each by Other route See admin instructions. As directed 04/08/21   Luciano Cutter, MD  traZODone (DESYREL) 100 MG tablet Take 100 mg by mouth at bedtime as needed for sleep. Take 2 tablets (200 mg) nightly prn for sleep 05/18/20   [provider]  Triamcinolone Acetonide (NASACORT AQ NA) Place 1 spray into the nose as needed.    [provider]  venlafaxine XR (EFFEXOR-XR) 75 MG 24 hr capsule Take 75 mg by mouth daily. 04/13/22   [provider]    No current facility-administered medications for this encounter.   Current Outpatient Medications  Medication Sig Dispense Refill    Acetylcysteine (NAC PO) Take 1 capsule by mouth daily.     albuterol (VENTOLIN HFA) 108 (90 Base) MCG/ACT inhaler Inhale 2 puffs into the lungs every 6 (six) hours as needed for wheezing or shortness of breath. 8 g 2   alendronate (FOSAMAX) 70 MG tablet Take 70 mg by mouth once a week.     amLODipine (NORVASC) 5 MG tablet Take 5 mg by mouth 2 (two) times daily.     ascorbic acid (VITAMIN C) 1000 MG tablet Take 1,000 mg by mouth daily.     budesonide (PULMICORT) 0.5 MG/2ML nebulizer solution Use one vial in nebulizer twice daily as directed. 120 mL 5   carvedilol (COREG) 6.25 MG tablet Take 6.25 mg by mouth 2 (two) times daily with a meal.     Cholecalciferol (VITAMIN D-3) 125 MCG (5000 UT) TABS Take 1 tablet by mouth 2 (two) times daily.     EPINEPHrine 0.3 mg/0.3 mL IJ SOAJ injection Use as directed for life-threatening allergic reaction. (Patient not taking: Reported on 01/06/2023) 2 Device 3   ferrous sulfate 325 (65 FE) MG EC tablet Take 325 mg by mouth as needed.     furosemide (LASIX) 40 MG tablet TAKE 1 TABLET BY MOUTH EVERY DAY 90 tablet 1   ipratropium-albuterol (DUONEB) 0.5-2.5 (3) MG/3ML SOLN Take 3 mLs by nebulization every 6 (six) hours as needed. (Patient taking differently: Take 3 mLs by nebulization every 6 (six) hours as needed (sob).) 360 mL 5   isosorbide mononitrate (IMDUR) 30 MG 24 hr tablet Take 1 tablet (30 mg total) by mouth daily. 90 tablet 3   levocetirizine (XYZAL) 5 MG tablet Take 5 mg by mouth daily.     levothyroxine (SYNTHROID) 88 MCG tablet Take 1 tablet (88 mcg total) by mouth daily. 90 tablet 3   losartan (COZAAR) 50 MG tablet Take 50 mg by mouth daily.     megestrol (MEGACE) 40 MG/ML suspension Take 400 mg by mouth every morning.     metFORMIN (GLUCOPHAGE) 500 MG tablet Take 500 mg by mouth daily at 6 (six) AM.     montelukast (SINGULAIR) 10 MG tablet TAKE 1 TABLET AT BEDTIME 30 tablet 0   Multiple Vitamins-Minerals (ONE DAILY MULTIVIT-MIN ADULT PO) Take 1 tablet  by mouth daily.     Multiple Vitamins-Minerals (PRESERVISION/LUTEIN PO) Take 1 tablet by mouth 2 (two) times daily. Unknown strength  nitroGLYCERIN (NITROSTAT) 0.4 MG SL tablet Place 1 tablet (0.4 mg total) under the tongue every 5 (five) minutes as needed for chest pain. MAX 3 DOSES IN 15 MINUTES. IF STILL PAIN CALL 911 25 tablet 6   OXYGEN Inhale 5 L/min into the lungs as needed.     pantoprazole (PROTONIX) 40 MG tablet Take one tablet by mouth twice daily as directed. (Patient taking differently: Take 40 mg by mouth 2 (two) times daily. Take one tablet by mouth twice daily as directed.) 180 tablet 1   potassium chloride SA (KLOR-CON M) 20 MEQ tablet Take 1 tablet (20 mEq total) by mouth daily. 90 tablet 3   pravastatin (PRAVACHOL) 20 MG tablet Take 20 mg by mouth daily.     predniSONE (DELTASONE) 1 MG tablet Take 8 mg by mouth daily.     predniSONE (DELTASONE) 5 MG tablet Take 5 mg by mouth daily.     Spacer/Aero-Holding Rudean Curt As directed (Patient taking differently: 1 each by Other route See admin instructions. As directed) 1 each 0   traZODone (DESYREL) 100 MG tablet Take 100 mg by mouth at bedtime as needed for sleep. Take 2 tablets (200 mg) nightly prn for sleep     Triamcinolone Acetonide (NASACORT AQ NA) Place 1 spray into the nose as needed.     venlafaxine XR (EFFEXOR-XR) 75 MG 24 hr capsule Take 75 mg by mouth daily.      Allergies as of 02/24/2023 - Review Complete 02/24/2023  Allergen Reaction Noted   Propranolol Other (See Comments) 05/15/2014   Gabapentin Other (See Comments) 04/15/2022   Codeine Nausea And Vomiting    Nitrofuran derivatives Nausea And Vomiting 07/24/2014    Review of Systems  Constitutional:  Positive for malaise/fatigue and weight loss. Negative for chills and fever.  HENT:  Negative for hearing loss and tinnitus.   Eyes:  Negative for blurred vision and photophobia.  Respiratory:  Negative for cough and hemoptysis.   Cardiovascular:  Negative  for chest pain and palpitations.  Gastrointestinal:  Positive for abdominal pain, diarrhea and nausea. Negative for blood in stool, constipation, heartburn, melena and vomiting.  Genitourinary:  Negative for dysuria and urgency.  Musculoskeletal:  Negative for myalgias and neck pain.  Skin:  Negative for itching and rash.  Neurological:  Positive for weakness. Negative for seizures and loss of consciousness.  Psychiatric/Behavioral:  Negative for depression and suicidal ideas.        Physical Exam:  Vital signs in last 24 hours: Temp:  [97.9 F (36.6 C)] 97.9 F (36.6 C) (06/05 1209) Pulse Rate:  [127] 127 (06/05 1209) Resp:  [17] 17 (06/05 1209) BP: (128)/(66) 128/66 (06/05 1209) SpO2:  [92 %] 92 % (06/05 1209)   Last BM recorded by nurses in past 5 days No data recorded  Physical Exam Constitutional:      Appearance: She is ill-appearing.  HENT:     Head: Normocephalic and atraumatic.     Nose: Nose normal. No congestion.     Mouth/Throat:     Mouth: Mucous membranes are moist.     Pharynx: Oropharynx is clear.  Eyes:     General: No scleral icterus.    Extraocular Movements: Extraocular movements intact.  Cardiovascular:     Rate and Rhythm: Normal rate and regular rhythm.  Pulmonary:     Effort: Pulmonary effort is normal. No respiratory distress.  Abdominal:     General: Abdomen is flat. Bowel sounds are normal. There is no distension.  Palpations: Abdomen is soft. There is no mass.     Tenderness: There is no abdominal tenderness. There is no guarding or rebound.     Hernia: No hernia is present.  Musculoskeletal:        General: No swelling. Normal range of motion.     Cervical back: Normal range of motion and neck supple.  Skin:    General: Skin is warm and dry.     Coloration: Skin is pale. Skin is not jaundiced.  Neurological:     General: No focal deficit present.     Mental Status: She is alert and oriented to person, place, and time.  Psychiatric:         Mood and Affect: Mood normal.        Behavior: Behavior normal.        Thought Content: Thought content normal.        Judgment: Judgment normal.      LAB RESULTS: No results for input(s): "WBC", "HGB", "HCT", "PLT" in the last 72 hours. BMET No results for input(s): "NA", "K", "CL", "CO2", "GLUCOSE", "BUN", "CREATININE", "CALCIUM" in the last 72 hours. LFT No results for input(s): "PROT", "ALBUMIN", "AST", "ALT", "ALKPHOS", "BILITOT", "BILIDIR", "IBILI" in the last 72 hours. PT/INR No results for input(s): "LABPROT", "INR" in the last 72 hours.  STUDIES: No results found.    Impression    Failure to thrive Diarrhea  -WBC 21.0, elevated from 15.1 67-month ago -Adequate kidney function -TSH 1.13 -Fecal occult positive -Hgb 13.3 -Lactic acid 1.4 -Hepatic panel, CRP, Lipase pending -GI pathogen panel and C diff to be collected - CT ab/pelvis ordered Previous workup with C diff positive, given Vanc 125mg  4x daily for 10 days (completed May 10th) with continued diarrhea. Pancreatic elastase was 141 at visit. Multiple studies/labs still pending. Suspect patient may still have C diff that was resistant to initial course of vancomycin. Could also be a component of pancreatic insufficiency with pancreatic elastase being low, however, less likely in the setting of leukocytosis.    Plan   - GI pathogen panel and C diff studies - Repeat CT ab/pelvis with contrast - If C diff is negative, will recommend colonoscopy during admission for further evaluation. - IVF for rehydration - Antiemetics as needed - Continue supportive care  Thank you for your kind consultation, we will continue to follow.   Bayley Leanna Sato  02/24/2023, 12:26 PM     Attending physician's note   I have taken history, reviewed the chart and examined the patient. I performed a substantive portion of this encounter, including complete performance of at least one of the key components, in conjunction with  the APP. I agree with the Advanced Practitioner's note, impression and recommendations.   Diarrhea with elevated WBC count, dehydration/failure to thrive. Recent + C diff 4/24 (treated with PO vanc). No other recent A/Bs.  Stable 2.5 cm pancreatic cyst vs IPMN (HOP)  H+ stools (likely d/t colitis). Pt is convinced and adamant that she has colon CA. Neg colon 2015  Plan: -Empiric vancomycin until stool studies are back -Contact precautions -Supportive treatment-trend CBC, lytes -CT Abdo/pelvis -If stool tests are neg, then colon as inpt. Otherwise, as outpt.  D/W pt, husband.   Edman Circle, MD [facility] [phone]   Addendum: Pharmacy wanted to hold off on empiric vanc PO and wanted [place] to try dificid (when confirmed via stool test). The stool test + C diff. Ordered PO dificid. RG

## 2023-02-24 NOTE — ED Provider Notes (Signed)
[place] EMERGENCY DEPARTMENT AT [facility] Provider Note   CSN: [id number] Arrival date & time: 02/24/23  1204     History  Chief Complaint  Patient presents with   Failure To Thrive    Jodi Valdez is a 87 y.o. female with a past medical history significant for asthma, hypertension, hyperlipidemia, history of long COVID on home oxygen as needed, history of breast cancer currently in remission per husband who presents to the ED due to failure to thrive.  Husband states patient has had decreased p.o. intake and generalized weakness for the past week.  Patient has been so weak she has been unable to get out of bed.  Patient recently treated for C. difficile on 4/24 with oral vancomycin.  She finished her antibiotics however, still has continued diarrhea.  Patient unable to tell me how many episodes of diarrhea she is having daily.  Husband states there is sometimes blood in the diarrhea which she attributes to her hemorrhoids.  Patient admits to intermittent abdominal pain.  No chest pain or shortness of breath.  No cough.  Denies fever.  Denies urinary symptoms.  History obtained from patient and past medical records. No interpreter used during encounter.       Home Medications Prior to Admission medications   Medication Sig Start Date End Date Taking? Authorizing Provider  Acetylcysteine (NAC PO) Take 1 capsule by mouth daily.    [provider]  albuterol (VENTOLIN HFA) 108 (90 Base) MCG/ACT inhaler Inhale 2 puffs into the lungs every 6 (six) hours as needed for wheezing or shortness of breath. 08/20/21   Luciano Cutter, MD  alendronate (FOSAMAX) 70 MG tablet Take 70 mg by mouth once a week. 12/12/13   [provider]  amLODipine (NORVASC) 5 MG tablet Take 5 mg by mouth 2 (two) times daily. 10/20/22   [provider]  ascorbic acid (VITAMIN C) 1000 MG tablet Take 1,000 mg by mouth daily. 06/17/20   [provider]  budesonide  (PULMICORT) 0.5 MG/2ML nebulizer solution Use one vial in nebulizer twice daily as directed. 12/07/22   Kozlow, Alvira Philips, MD  carvedilol (COREG) 6.25 MG tablet Take 6.25 mg by mouth 2 (two) times daily with a meal.    [provider]  Cholecalciferol (VITAMIN D-3) 125 MCG (5000 UT) TABS Take 1 tablet by mouth 2 (two) times daily.    [provider]  EPINEPHrine 0.3 mg/0.3 mL IJ SOAJ injection Use as directed for life-threatening allergic reaction. Patient not taking: Reported on 01/06/2023 12/13/17   Jessica Priest, MD  ferrous sulfate 325 (65 FE) MG EC tablet Take 325 mg by mouth as needed.    [provider]  furosemide (LASIX) 40 MG tablet TAKE 1 TABLET BY MOUTH EVERY DAY 03/27/22   Georgeanna Lea, MD  ipratropium-albuterol (DUONEB) 0.5-2.5 (3) MG/3ML SOLN Take 3 mLs by nebulization every 6 (six) hours as needed. Patient taking differently: Take 3 mLs by nebulization every 6 (six) hours as needed (sob). 11/03/21   Luciano Cutter, MD  isosorbide mononitrate (IMDUR) 30 MG 24 hr tablet Take 1 tablet (30 mg total) by mouth daily. 11/09/22   Georgeanna Lea, MD  levocetirizine (XYZAL) 5 MG tablet Take 5 mg by mouth daily. 04/13/22   [provider]  levothyroxine (SYNTHROID) 88 MCG tablet Take 1 tablet (88 mcg total) by mouth daily. 10/30/22   Shamleffer, Konrad Dolores, MD  losartan (COZAAR) 50 MG tablet Take 50 mg  by mouth daily.    [provider]  megestrol (MEGACE) 40 MG/ML suspension Take 400 mg by mouth every morning. 04/13/22   [provider]  metFORMIN (GLUCOPHAGE) 500 MG tablet Take 500 mg by mouth daily at 6 (six) AM.    [provider]  montelukast (SINGULAIR) 10 MG tablet TAKE 1 TABLET AT BEDTIME 10/26/22   Kozlow, Alvira Philips, MD  Multiple Vitamins-Minerals (ONE DAILY MULTIVIT-MIN ADULT PO) Take 1 tablet by mouth daily.    [provider]  Multiple Vitamins-Minerals (PRESERVISION/LUTEIN PO) Take 1 tablet by mouth 2 (two)  times daily. Unknown strength    [provider]  nitroGLYCERIN (NITROSTAT) 0.4 MG SL tablet Place 1 tablet (0.4 mg total) under the tongue every 5 (five) minutes as needed for chest pain. MAX 3 DOSES IN 15 MINUTES. IF STILL PAIN CALL 911 10/07/22   Georgeanna Lea, MD  OXYGEN Inhale 5 L/min into the lungs as needed.    [provider]  pantoprazole (PROTONIX) 40 MG tablet Take one tablet by mouth twice daily as directed. Patient taking differently: Take 40 mg by mouth 2 (two) times daily. Take one tablet by mouth twice daily as directed. 04/15/22   Kozlow, Alvira Philips, MD  potassium chloride SA (KLOR-CON M) 20 MEQ tablet Take 1 tablet (20 mEq total) by mouth daily. 06/11/22   Georgeanna Lea, MD  pravastatin (PRAVACHOL) 20 MG tablet Take 20 mg by mouth daily. 04/03/21   [provider]  predniSONE (DELTASONE) 1 MG tablet Take 8 mg by mouth daily. 01/12/19   [provider]  predniSONE (DELTASONE) 5 MG tablet Take 5 mg by mouth daily. 03/16/22   [provider]  Spacer/Aero-Holding Rudean Curt As directed Patient taking differently: 1 each by Other route See admin instructions. As directed 04/08/21   Luciano Cutter, MD  traZODone (DESYREL) 100 MG tablet Take 100 mg by mouth at bedtime as needed for sleep. Take 2 tablets (200 mg) nightly prn for sleep 05/18/20   [provider]  Triamcinolone Acetonide (NASACORT AQ NA) Place 1 spray into the nose as needed.    [provider]  venlafaxine XR (EFFEXOR-XR) 75 MG 24 hr capsule Take 75 mg by mouth daily. 04/13/22   [provider]      Allergies    Propranolol, Gabapentin, Codeine, and Nitrofuran derivatives    Review of Systems   Review of Systems  Constitutional:  Negative for chills and fever.  Respiratory:  Negative for cough and shortness of breath.   Cardiovascular:  Negative for chest pain.  Gastrointestinal:  Positive for abdominal pain and diarrhea. Negative for nausea  and vomiting.  Neurological:  Positive for weakness.    Physical Exam Updated Vital Signs BP (!) 142/97 (BP Location: Right Arm)   Pulse (!) 116   Temp 98.9 F (37.2 C) (Oral)   Resp 19   SpO2 99%  Physical Exam Vitals and nursing note reviewed.  Constitutional:      General: She is not in acute distress.    Appearance: She is not ill-appearing.  HENT:     Head: Normocephalic.     Mouth/Throat:     Comments: Dry mucus membranes Eyes:     Pupils: Pupils are equal, round, and reactive to light.  Cardiovascular:     Rate and Rhythm: Normal rate and regular rhythm.     Pulses: Normal pulses.     Heart sounds: Normal heart sounds. No murmur heard.  No friction rub. No gallop.  Pulmonary:     Effort: Pulmonary effort is normal.     Breath sounds: Normal breath sounds.  Abdominal:     General: Abdomen is flat. There is no distension.     Palpations: Abdomen is soft.     Tenderness: There is no abdominal tenderness. There is no guarding or rebound.  Genitourinary:    Comments: Rectal exam performed with chaperone in room.  Numerous external hemorrhoids without evidence of thrombosis.  Light brown stool obtained. Musculoskeletal:        General: Normal range of motion.     Cervical back: Neck supple.  Skin:    General: Skin is warm and dry.  Neurological:     General: No focal deficit present.     Mental Status: She is alert.     Comments: Moves all 4 extremities without difficulty.  Psychiatric:        Mood and Affect: Mood normal.        Behavior: Behavior normal.     ED Results / Procedures / Treatments   Labs (all labs ordered are listed, but only abnormal results are displayed) Labs Reviewed  BASIC METABOLIC PANEL - Abnormal; Notable for the following components:      Result Value   Glucose, Bld 157 (*)    Calcium 8.5 (*)    GFR, Estimated 60 (*)    All other components within normal limits  CBC - Abnormal; Notable for the following components:   WBC 21.0  (*)    All other components within normal limits  HEPATIC FUNCTION PANEL - Abnormal; Notable for the following components:   Total Protein 5.7 (*)    Albumin 2.7 (*)    All other components within normal limits  POC OCCULT BLOOD, ED - Abnormal; Notable for the following components:   Fecal Occult Bld POSITIVE (*)    All other components within normal limits  CULTURE, BLOOD (ROUTINE X 2)  CULTURE, BLOOD (ROUTINE X 2)  GASTROINTESTINAL PANEL BY PCR, STOOL (REPLACES STOOL CULTURE)  C DIFFICILE QUICK SCREEN W PCR REFLEX    LACTIC ACID, PLASMA  TSH  LIPASE, BLOOD  URINALYSIS, ROUTINE W REFLEX MICROSCOPIC  LACTIC ACID, PLASMA  T4, FREE  C-REACTIVE PROTEIN  CBG MONITORING, ED    EKG None  Radiology CT ABDOMEN PELVIS W CONTRAST  Result Date: 02/24/2023 CLINICAL DATA:  Failure to thrive, lethargy, and left lower quadrant abdominal pain. Recent diagnosis of C diff colitis status post treatment. EXAM: CT ABDOMEN AND PELVIS WITH CONTRAST TECHNIQUE: Multidetector CT imaging of the abdomen and pelvis was performed using the standard protocol following bolus administration of intravenous contrast. RADIATION DOSE REDUCTION: This exam was performed according to the departmental dose-optimization program which includes automated exposure control, adjustment of the mA and/or kV according to patient size and/or use of iterative reconstruction technique. CONTRAST:  OMNIPAQUE IOHEXOL 300 MG/ML  SOLN COMPARISON:  CT abdomen and pelvis dated 01/27/2023 FINDINGS: Lower chest: No focal consolidation or pulmonary nodule in the lung bases. No pleural effusion or pneumothorax demonstrated. Partially imaged heart size is normal. Hepatobiliary: No focal hepatic lesions. No intra or extrahepatic biliary ductal dilation. Normal gallbladder. 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 An adjacent cystic focus is also unchanged from  recent studies but slowly enlarging in size from more remote studies. No main ductal dilation, mass lesion, or abnormal enhancement. Spleen: Normal in size without focal abnormality. Adrenals/Urinary Tract: No adrenal nodules. No suspicious renal mass, calculi or hydronephrosis. Pole simple left lower cyst. No specific follow-up imaging recommended. No focal bladder wall thickening. Stomach/Bowel: Normal appearance of the stomach. Mural thickening and mucosal hyperenhancement involving nearly the entire colon. No abnormal bowel dilation. Appendix is not discretely seen. Vascular/Lymphatic: Aortic atherosclerosis. No enlarged abdominal or pelvic lymph nodes. Reproductive: No adnexal masses. Other: No free fluid, fluid collection, or free air. Musculoskeletal: No acute or abnormal lytic or blastic osseous lesions. Multilevel degenerative changes of the partially imaged thoracic and lumbar spine. Unchanged compression deformities of L1 and L2. Spine. IMPRESSION: 1. Mural thickening and mucosal hyperenhancement involving nearly the entire colon, consistent with colitis. 2. 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 An adjacent cystic focus is also unchanged from recent studies but slowly enlarging in size from more remote studies. EUS/FNA can be considered given interval growth, however should be anchored to the patient's overall health and preferences and only if the patient is a surgical candidate. This recommendation follows ACR consensus guidelines: Management of Incidental Pancreatic Cysts: A White Paper of the ACR Incidental Findings Committee. J Am  Coll Radiol 2017;[id number]. 3.  Aortic Atherosclerosis (ICD10-I70.0). Electronically Signed   By: Agustin Cree M.D.   On: 02/24/2023 15:58    Procedures Procedures    Medications Ordered in ED Medications  sodium chloride 0.9 % bolus 1,000 mL (1,000 mLs Intravenous New Bag/Given 02/24/23 1257)  iohexol (OMNIPAQUE) 300 MG/ML solution 100 mL (100 mLs Intravenous Contrast Given 02/24/23 1503)    ED Course/ Medical Decision Making/ A&P Clinical Course as of 02/24/23 1634  Wed Feb 24, 2023  1304 Will hold antibiotics at this time due to concerns about possible c. Dif and so not want to worsen possible issue. Discussed with Dr. Posey Rea who agrees with assessment and plan. [CA]  1357 TSH: 1.131 [CA]  1427 Fecal Occult Blood, POC(!): POSITIVE [CA]    Clinical Course User Index [CA] Mannie Stabile, PA-C                             Medical Decision Making Amount and/or Complexity of Data Reviewed Independent Historian: spouse External Data Reviewed: notes. Labs: ordered. Decision-making details documented in ED Course. Radiology: ordered. ECG/medicine tests: ordered and independent interpretation performed. Decision-making details documented in ED Course.  Risk Prescription drug management. Decision regarding hospitalization.   This patient presents to the ED for concern of decreased po intake, generalized weakness, this involves an extensive number of treatment options, and is a complaint that carries with it a high risk of complications and morbidity.  The differential diagnosis includes dehydration, C. difficile, sepsis, etc  87 year old female presents to the ED due to failure to thrive.  Husband at bedside states patient has had decreased p.o. intake and generalized weakness x 1 week.  Patient recently treated for C. difficile on 4/24 with oral vancomycin which she completed however, continues to have diarrhea.  Unsure how many episodes of diarrhea she is having daily.  No fever or  chills.  Intermittent abdominal pain.  Denies chest  pain, shortness of breath, and cough.  Upon arrival patient tachycardic at 127.  Rectal temperature obtained during initial evaluation which was normal.  Abdomen soft, nondistended, nontender.  Routine labs ordered to rule out electrolyte abnormalities.  Rectal exam performed with chaperone in room with numerous external hemorrhoids without evidence of thrombosis.  IV fluids started.  Feel patient is likely dehydrated due to diarrhea.  CBC significant for leukocytosis at 21.  Normal hemoglobin.  Will hold on antibiotics at this time given my concern for C. difficile is high.  Dr. Chales Abrahams with GI saw patient and will follow during her admission. CT abdomen ordered.  BMP significant for hyperglycemia at 157.  No anion gap.  No major electrolyte derangements.  Lipase normal.  CT abdomen personally reviewed and interpreted which demonstrates colitis.  Also a cystic focus in the pancreatic head.  Lactic acid normal.  EKG demonstrates sinus tachycardia.  4:23 PM Discussed with Dr. Pola Corn with [facility] who agrees to admit patient.  Has PCP Lives at home Hx recent C. Dif  Discussed with Dr. Posey Rea who agrees with assessment and plan.       Final Clinical Impression(s) / ED Diagnoses Final diagnoses:  Diarrhea, unspecified type  Failure to thrive in adult    Rx / DC Orders ED Discharge Orders     None         Jesusita Oka 02/24/23 1635    K[place], [place], MD 02/24/23 2020

## 2023-02-24 NOTE — H&P (Signed)
[facility] History and Physical  Jodi Valdez WGN:[id number] DOB: 1936/07/21 DOA: 02/24/2023 PCP: Ailene Ravel, MD  Admitted from: Home Chief Complaint: Persistent diarrhea  History of Present Illness: Jodi Valdez is a 87 y.o. female with PMH significant for HTN, HLD, left breast cancer s/p chemo/ radiation/mastectomy 2002, GERD, anemia, polymyalgia, pancreatic cyst Patient presented to the ED today with complaint of progressively worsening diarrhea and failure to thrive.  4/17, seen by GI Dr. Chales Abrahams in office for diarrhea, abdominal cramping.  C. difficile was positive and patient was given oral vancomycin for 10 days.  Despite completion of vancomycin course, patient continued to have up to 5 watery bowel movements per day, occasionally with blood.  Over the last week, patient also had poor oral intake, poor hydration and progressive generalized weakness.  5/8, CT abdomen pelvis showed normal liver, gallbladder and GI tract.  It suspected rectal prolapse.  Today 6/5, patient was scheduled to follow-up with Dr. Chales Abrahams in the office.  Her husband called and stated that patient was very weak to make to the appointment.  She was directed to the ED.  In the ED, patient was afebrile, tachycardic to 120s, blood pressure stable, breathing room air Labs with WC count 21,000, hemoglobin normal, CMP with albumin low at 2.7, otherwise unremarkable, lactic acid normal Fecal occult blood positive CT abdomen pelvis showed mural thickening and mucosal hyperenhancement involving nearly the entire colon, consistent with colitis. Patient was seen by GI Dr. Chales Abrahams in the ED. recommendations given as below. Hospitalist service consulted for inpatient admission and management  At the time of my evaluation, patient lying down in bed.  Alert, awake, frustrated, almost tearful talking about persistence of diarrhea.  Looks very depressed.  Overall dry.  Husband at bedside.  Review of  Systems:  All systems were reviewed and were negative unless otherwise mentioned in the HPI   Past medical history: Past Medical History:  Diagnosis Date   Allergy    Anemia    Asthma    Breast cancer (HCC)    Cancer (HCC) 11/01/2000   left breast cancer   GERD (gastroesophageal reflux disease)    Heart disease    Hyperlipidemia    Hypertension    Long COVID    Personal history of chemotherapy    2002   Personal history of radiation therapy    2002   Polymyalgia [facility])     Past surgical history: Past Surgical History:  Procedure Laterality Date   ABDOMINAL HYSTERECTOMY  1975   BREAST SURGERY Left 11/01/2000   masectomy   COLONOSCOPY  2015   CYSTOCELE REPAIR  09/1999   MASTECTOMY Left    2002 with tramflap   RECTOCELE REPAIR  09/1999    Social History:  reports that she has never smoked. She has never used smokeless tobacco. She reports that she does not drink alcohol and does not use drugs.  Allergies:  Allergies  Allergen Reactions   Propranolol Other (See Comments)    Hallucinations    Gabapentin Other (See Comments)    Hallucinations   Metformin And Related Diarrhea   Codeine Nausea And Vomiting   Nitrofuran Derivatives Nausea And Vomiting and Other (See Comments)    GI Intolerance   Propranolol, Gabapentin, Metformin and related, Codeine, and Nitrofuran derivatives   Family history:  Family History  Problem Relation Age of Onset   Colon cancer Neg Hx    Colon polyps Neg Hx    Diabetes Neg Hx  Kidney disease Neg Hx    Esophageal cancer Neg Hx      Home Meds: Prior to Admission medications   Medication Sig Start Date End Date Taking? Authorizing Provider  Acetylcysteine (NAC PO) Take 1 capsule by mouth daily.    [provider]  albuterol (VENTOLIN HFA) 108 (90 Base) MCG/ACT inhaler Inhale 2 puffs into the lungs every 6 (six) hours as needed for wheezing or shortness of breath. 08/20/21   Luciano Cutter, MD  alendronate (FOSAMAX) 70  MG tablet Take 70 mg by mouth once a week. 12/12/13   [provider]  amLODipine (NORVASC) 5 MG tablet Take 5 mg by mouth 2 (two) times daily. 10/20/22   [provider]  ascorbic acid (VITAMIN C) 1000 MG tablet Take 1,000 mg by mouth daily. 06/17/20   [provider]  budesonide (PULMICORT) 0.5 MG/2ML nebulizer solution Use one vial in nebulizer twice daily as directed. 12/07/22   Kozlow, Alvira Philips, MD  carvedilol (COREG) 6.25 MG tablet Take 6.25 mg by mouth 2 (two) times daily with a meal.    [provider]  Cholecalciferol (VITAMIN D-3) 125 MCG (5000 UT) TABS Take 1 tablet by mouth 2 (two) times daily.    [provider]  EPINEPHrine 0.3 mg/0.3 mL IJ SOAJ injection Use as directed for life-threatening allergic reaction. Patient not taking: Reported on 01/06/2023 12/13/17   Jessica Priest, MD  ferrous sulfate 325 (65 FE) MG EC tablet Take 325 mg by mouth as needed.    [provider]  furosemide (LASIX) 40 MG tablet TAKE 1 TABLET BY MOUTH EVERY DAY 03/27/22   Georgeanna Lea, MD  ipratropium-albuterol (DUONEB) 0.5-2.5 (3) MG/3ML SOLN Take 3 mLs by nebulization every 6 (six) hours as needed. Patient taking differently: Take 3 mLs by nebulization every 6 (six) hours as needed (sob). 11/03/21   Luciano Cutter, MD  isosorbide mononitrate (IMDUR) 30 MG 24 hr tablet Take 1 tablet (30 mg total) by mouth daily. 11/09/22   Georgeanna Lea, MD  levocetirizine (XYZAL) 5 MG tablet Take 5 mg by mouth daily. 04/13/22   [provider]  levothyroxine (SYNTHROID) 88 MCG tablet Take 1 tablet (88 mcg total) by mouth daily. 10/30/22   Shamleffer, Konrad Dolores, MD  losartan (COZAAR) 50 MG tablet Take 50 mg by mouth daily.    [provider]  megestrol (MEGACE) 40 MG/ML suspension Take 400 mg by mouth every morning. 04/13/22   [provider]  metFORMIN (GLUCOPHAGE) 500 MG tablet Take 500 mg by mouth daily at 6 (six) AM.    [provider]  montelukast (SINGULAIR) 10 MG tablet TAKE 1 TABLET AT BEDTIME 10/26/22   Kozlow, Alvira Philips, MD  Multiple Vitamins-Minerals (ONE DAILY MULTIVIT-MIN ADULT PO) Take 1 tablet by mouth daily.    [provider]  Multiple Vitamins-Minerals (PRESERVISION/LUTEIN PO) Take 1 tablet by mouth 2 (two) times daily. Unknown strength    [provider]  nitroGLYCERIN (NITROSTAT) 0.4 MG SL tablet Place 1 tablet (0.4 mg total) under the tongue every 5 (five) minutes as needed for chest pain. MAX 3 DOSES IN 15 MINUTES. IF STILL PAIN CALL 911 10/07/22   Georgeanna Lea, MD  OXYGEN Inhale 5 L/min into the lungs as needed.    [provider]  pantoprazole (PROTONIX) 40 MG tablet Take one tablet by mouth twice daily as directed. Patient taking differently: Take 40 mg by mouth 2 (two) times daily. Take one tablet  by mouth twice daily as directed. 04/15/22   Kozlow, Alvira Philips, MD  potassium chloride SA (KLOR-CON M) 20 MEQ tablet Take 1 tablet (20 mEq total) by mouth daily. 06/11/22   Georgeanna Lea, MD  pravastatin (PRAVACHOL) 20 MG tablet Take 20 mg by mouth daily. 04/03/21   [provider]  predniSONE (DELTASONE) 1 MG tablet Take 8 mg by mouth daily. 01/12/19   [provider]  predniSONE (DELTASONE) 5 MG tablet Take 5 mg by mouth daily. 03/16/22   [provider]  Spacer/Aero-Holding Rudean Curt As directed Patient taking differently: 1 each by Other route See admin instructions. As directed 04/08/21   Luciano Cutter, MD  traZODone (DESYREL) 100 MG tablet Take 100 mg by mouth at bedtime as needed for sleep. Take 2 tablets (200 mg) nightly prn for sleep 05/18/20   [provider]  Triamcinolone Acetonide (NASACORT AQ NA) Place 1 spray into the nose as needed.    [provider]  venlafaxine XR (EFFEXOR-XR) 75 MG 24 hr capsule Take 75 mg by mouth daily. 04/13/22   [provider]    Physical Exam: Vitals:   02/24/23 1315  02/24/23 1625 02/24/23 1706 02/24/23 1829  BP: 130/74 (!) 142/97 (!) 142/89 (!) 149/81  Pulse: (!) 102 (!) 116 (!) 123 (!) 120  Resp: 13 19 16 18   Temp:  98.9 F (37.2 C) 98.5 F (36.9 C) 98.4 F (36.9 C)  TempSrc:  Oral Oral Oral  SpO2: 99% 99% 98% 97%   Wt Readings from Last 3 Encounters:  01/06/23 56.7 kg  11/09/22 56.9 kg  10/22/22 55.8 kg   There is no height or weight on file to calculate BMI.  General exam: Pleasant, elderly Caucasian female. Skin: No rashes, lesions or ulcers.  Overall dry skin look HEENT: Atraumatic, normocephalic, no obvious bleeding Lungs: Clear to auscultation bilaterally CVS: Regular rate and rhythm, no murmur GI/Abd soft, mild generalized tenderness, nondistended, bowel sound present CNS: Alert, awake, oriented x 3 Psychiatry: Looks depressed Extremities: No pedal edema, no calf tenderness   ------------------------------------------------------------------------------------------------------ Assessment/Plan: Principal Problem:   Acute colitis Active Problems:   Essential hypertension   PERSONAL HX BREAST CANCER   Heme positive stool   Polymyalgia (HCC)   Hyperlipidemia  Persistent diarrhea Acute colitis likely recurrent C. difficile infection Patient has almost 2 months history of persistent diarrhea, abdominal cramping.  Was treated with 10-day course of oral vancomycin 6 weeks ago without improvement CT abdomen pelvis on admission showed pancolitis which is new compared to normal colonic mucosa in CT abdomen from 4 weeks ago. Seen by GI Stool studies sent for C. difficile, GIP Start IV hydration.  Patient reports anorexia but wants to try regular diet It seems GI had earlier planned for empiric oral vancomycin pending C. difficile result.  However the order was later discontinued. Given the persistence and volume of diarrhea, Flexi-Seal can be considered.  Patient is agreeable Recent Labs  Lab 02/24/23 1216 02/24/23 1228  WBC  21.0*  --   LATICACIDVEN  --  1.4   FOBT positive stool History of GERD Likely secondary to colitis.  Monitor for frank bleeding.  Hemoglobin stable. Keep BP on hold given C. difficile infection Recent Labs    04/04/22 1030 01/06/23 1712 02/24/23 1216  HGB 12.4 13.2 13.3  MCV 99.5 95.2 95.4   Protein calorie malnutrition Hypoalbuminemia Albumin low at 2.7 secondary to GI loss and poor oral intake.  Consult nutritionist  Essential hypertension PTA on  Coreg 6.25 mg twice daily, amlodipine 5 mg daily, Imdur 30 mg daily, losartan 50 mg daily, Lasix 20 mg daily as needed Continue Coreg.  With persistent diarrhea and risk of hypotension, I would hold other antihypertensives. Continue to monitor Most recent echo March 2024 with normal EF  Hyperlipidemia Pravastatin  Hypothyroidism Synthroid to continue  H/o left breast cancer  s/p chemo/ radiation/mastectomy 2002,   Polymyalgia  pancreatic cyst CT abdomen showed previously known pancreatic cyst slowly enlarging in size -suspicious for intraductal papillary mucinous.  Defer to GI for any need/urgency of EUS/FNA  Generalized weakness Impaired mobility PT eval ordered   Goals of care   Code Status: Full Code    DVT prophylaxis:  enoxaparin (LOVENOX) injection 40 mg Start: 02/24/23 2200   Antimicrobials: Not restarted on oral vancomycin, pending C. difficile assay Fluid: NS at 100 mill per hour Consultants: GI Family Communication: Husband at bedside  Dispo: The patient is from: Home              Anticipated d/c is to: Depends on clinical course, PT eval  Diet: Diet Order             Diet regular Fluid consistency: Thin  Diet effective now                   ------------------------------------------------------------------------------------- Severity of Illness: The appropriate patient status for this patient is INPATIENT. Inpatient status is judged to be reasonable and necessary in order to provide the  required intensity of service to ensure the patient's safety. The patient's presenting symptoms, physical exam findings, and initial radiographic and laboratory data in the context of their chronic comorbidities is felt to place them at high risk for further clinical deterioration. Furthermore, it is not anticipated that the patient will be medically stable for discharge from the hospital within 2 midnights of admission.   * I certify that at the point of admission it is my clinical judgment that the patient will require inpatient hospital care spanning beyond 2 midnights from the point of admission due to high intensity of service, high risk for further deterioration and high frequency of surveillance required.* -------------------------------------------------------------------------------------  Labs on Admission:   CBC: Recent Labs  Lab 02/24/23 1216  WBC 21.0*  HGB 13.3  HCT 39.6  MCV 95.4  PLT 207    Basic Metabolic Panel: Recent Labs  Lab 02/24/23 1216  NA 135  K 3.7  CL 102  CO2 22  GLUCOSE 157*  BUN 16  CREATININE 0.94  CALCIUM 8.5*    Liver Function Tests: Recent Labs  Lab 02/24/23 1216  AST 21  ALT 28  ALKPHOS 43  BILITOT 1.0  PROT 5.7*  ALBUMIN 2.7*   Recent Labs  Lab 02/24/23 1216  LIPASE 26   No results for input(s): "AMMONIA" in the last 168 hours.  Cardiac Enzymes: No results for input(s): "CKTOTAL", "CKMB", "CKMBINDEX", "TROPONINI" in the last 168 hours.  BNP (last 3 results) No results for input(s): "BNP" in the last 8760 hours.  ProBNP (last 3 results) Recent Labs    11/09/22 1501  PROBNP 550    CBG: No results for input(s): "GLUCAP" in the last 168 hours.  Lipase     Component Value Date/Time   LIPASE 26 02/24/2023 1216     Urinalysis    Component Value Date/Time   COLORURINE YELLOW 04/04/2022 0959   APPEARANCEUR CLEAR 04/04/2022 0959   LABSPEC 1.010 04/04/2022 0959   LABSPEC 1.005 07/08/2007 1555  PHURINE 6.0  04/04/2022 0959   GLUCOSEU NEGATIVE 04/04/2022 0959   GLUCOSEU NEGATIVE 07/16/2014 1120   HGBUR NEGATIVE 04/04/2022 0959   BILIRUBINUR NEGATIVE 04/04/2022 0959   BILIRUBINUR Negative 07/08/2007 1555   KETONESUR NEGATIVE 04/04/2022 0959   PROTEINUR NEGATIVE 04/04/2022 0959   UROBILINOGEN 0.2 09/26/2014 1130   NITRITE NEGATIVE 04/04/2022 0959   LEUKOCYTESUR MODERATE (A) 04/04/2022 0959   LEUKOCYTESUR Negative 07/08/2007 1555     Drugs of Abuse  No results found for: "LABOPIA", "COCAINSCRNUR", "LABBENZ", "AMPHETMU", "THCU", "LABBARB"    Radiological Exams on Admission: CT ABDOMEN PELVIS W CONTRAST  Result Date: 02/24/2023 CLINICAL DATA:  Failure to thrive, lethargy, and left lower quadrant abdominal pain. Recent diagnosis of C diff colitis status post treatment. EXAM: CT ABDOMEN AND PELVIS WITH CONTRAST TECHNIQUE: Multidetector CT imaging of the abdomen and pelvis was performed using the standard protocol following bolus administration of intravenous contrast. RADIATION DOSE REDUCTION: This exam was performed according to the departmental dose-optimization program which includes automated exposure control, adjustment of the mA and/or kV according to patient size and/or use of iterative reconstruction technique. CONTRAST:  OMNIPAQUE IOHEXOL 300 MG/ML  SOLN COMPARISON:  CT abdomen and pelvis dated 01/27/2023 FINDINGS: Lower chest: No focal consolidation or pulmonary nodule in the lung bases. No pleural effusion or pneumothorax demonstrated. Partially imaged heart size is normal. Hepatobiliary: No focal hepatic lesions. No intra or extrahepatic biliary ductal dilation. Normal gallbladder. 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 An adjacent cystic focus is also  unchanged from recent studies but slowly enlarging in size from more remote studies. No main ductal dilation, mass lesion, or abnormal enhancement. Spleen: Normal in size without focal abnormality. Adrenals/Urinary Tract: No adrenal nodules. No suspicious renal mass, calculi or hydronephrosis. Pole simple left lower cyst. No specific follow-up imaging recommended. No focal bladder wall thickening. Stomach/Bowel: Normal appearance of the stomach. Mural thickening and mucosal hyperenhancement involving nearly the entire colon. No abnormal bowel dilation. Appendix is not discretely seen. Vascular/Lymphatic: Aortic atherosclerosis. No enlarged abdominal or pelvic lymph nodes. Reproductive: No adnexal masses. Other: No free fluid, fluid collection, or free air. Musculoskeletal: No acute or abnormal lytic or blastic osseous lesions. Multilevel degenerative changes of the partially imaged thoracic and lumbar spine. Unchanged compression deformities of L1 and L2. Spine. IMPRESSION: 1. Mural thickening and mucosal hyperenhancement involving nearly the entire colon, consistent with colitis. 2. 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 An adjacent cystic focus is also unchanged from recent studies but slowly enlarging in size from more remote studies. EUS/FNA can be considered given interval growth, however should be anchored to the patient's overall health and preferences and only if the patient is a surgical candidate. This recommendation follows ACR consensus guidelines: Management of Incidental Pancreatic Cysts: A White Paper of the ACR Incidental Findings Committee. J Am Coll Radiol 2017;14:911-923. 3.  Aortic Atherosclerosis (ICD10-I70.0). Electronically Signed   By: Agustin Cree M.D.   On: 02/24/2023  15:58     Signed, Lorin Glass, MD [facility] 02/24/2023

## 2023-02-25 ENCOUNTER — Other Ambulatory Visit (HOSPITAL_COMMUNITY): Payer: Self-pay

## 2023-02-25 DIAGNOSIS — R338 Other retention of urine: Secondary | ICD-10-CM | POA: Diagnosis present

## 2023-02-25 DIAGNOSIS — A0472 Enterocolitis due to Clostridium difficile, not specified as recurrent: Secondary | ICD-10-CM | POA: Diagnosis not present

## 2023-02-25 DIAGNOSIS — E039 Hypothyroidism, unspecified: Secondary | ICD-10-CM | POA: Diagnosis present

## 2023-02-25 DIAGNOSIS — R531 Weakness: Secondary | ICD-10-CM

## 2023-02-25 DIAGNOSIS — A0471 Enterocolitis due to Clostridium difficile, recurrent: Secondary | ICD-10-CM

## 2023-02-25 LAB — CBC
HCT: 32.4 % — ABNORMAL LOW (ref 36.0–46.0)
Hemoglobin: 10.7 g/dL — ABNORMAL LOW (ref 12.0–15.0)
MCH: 31.9 pg (ref 26.0–34.0)
MCHC: 33 g/dL (ref 30.0–36.0)
MCV: 96.7 fL (ref 80.0–100.0)
Platelets: 184 10*3/uL (ref 150–400)
RBC: 3.35 MIL/uL — ABNORMAL LOW (ref 3.87–5.11)
RDW: 13.6 % (ref 11.5–15.5)
WBC: 16 10*3/uL — ABNORMAL HIGH (ref 4.0–10.5)
nRBC: 0 % (ref 0.0–0.2)

## 2023-02-25 LAB — GASTROINTESTINAL PANEL BY PCR, STOOL (REPLACES STOOL CULTURE)

## 2023-02-25 LAB — GLUCOSE, CAPILLARY
Glucose-Capillary: 102 mg/dL — ABNORMAL HIGH (ref 70–99)
Glucose-Capillary: 110 mg/dL — ABNORMAL HIGH (ref 70–99)
Glucose-Capillary: 180 mg/dL — ABNORMAL HIGH (ref 70–99)
Glucose-Capillary: 134 mg/dL — ABNORMAL HIGH (ref 70–99)

## 2023-02-25 LAB — COMPREHENSIVE METABOLIC PANEL
ALT: 24 U/L (ref 0–44)
AST: 21 U/L (ref 15–41)
Albumin: 2.1 g/dL — ABNORMAL LOW (ref 3.5–5.0)
Alkaline Phosphatase: 37 U/L — ABNORMAL LOW (ref 38–126)
Anion gap: 7 (ref 5–15)
BUN: 12 mg/dL (ref 8–23)
CO2: 22 mmol/L (ref 22–32)
Calcium: 7.3 mg/dL — ABNORMAL LOW (ref 8.9–10.3)
Chloride: 102 mmol/L (ref 98–111)
Creatinine, Ser: 0.79 mg/dL (ref 0.44–1.00)
GFR, Estimated: >60 mL/min (ref >60)
Glucose, Bld: 109 mg/dL — ABNORMAL HIGH (ref 70–99)
Potassium: 2.9 mmol/L — ABNORMAL LOW (ref 3.5–5.1)
Sodium: 131 mmol/L — ABNORMAL LOW (ref 135–145)
Total Bilirubin: 0.5 mg/dL (ref 0.3–1.2)
Total Protein: 4.5 g/dL — ABNORMAL LOW (ref 6.5–8.1)

## 2023-02-25 LAB — PHOSPHORUS: Phosphorus: 3.2 mg/dL (ref 2.5–4.6)

## 2023-02-25 LAB — MAGNESIUM: Magnesium: 1.6 mg/dL — ABNORMAL LOW (ref 1.7–2.4)

## 2023-02-25 MED ORDER — ALBUTEROL SULFATE (2.5 MG/3ML) 0.083% IN NEBU: 2.5000 mg | INHALATION_SOLUTION | Freq: Four times a day (QID) | RESPIRATORY_TRACT | Status: DC | PRN

## 2023-02-25 MED ORDER — PREDNISONE 5 MG PO TABS: 5.0000 mg | ORAL_TABLET | Freq: Every day | ORAL | Status: DC

## 2023-02-25 MED ORDER — MAGNESIUM SULFATE 2 GM/50ML IV SOLN
2.0000 g | Freq: Once | INTRAVENOUS | Status: AC
  Administered 2023-02-25: 2 g via INTRAVENOUS
  Filled 2023-02-25: qty 50

## 2023-02-25 MED ORDER — BOOST / RESOURCE BREEZE PO LIQD CUSTOM
1.0000 | Freq: Three times a day (TID) | ORAL | Status: DC
  Administered 2023-02-25 – 2023-02-26 (×3): 1 via ORAL

## 2023-02-25 MED ORDER — AMLODIPINE BESYLATE 5 MG PO TABS
5.0000 mg | ORAL_TABLET | Freq: Two times a day (BID) | ORAL | Status: DC
  Administered 2023-02-25 – 2023-02-26 (×2): 5 mg via ORAL
  Filled 2023-02-25 (×2): qty 1

## 2023-02-25 MED ORDER — VANCOMYCIN HCL 125 MG PO CAPS: 125.0000 mg | ORAL_CAPSULE | ORAL | Status: DC

## 2023-02-25 MED ORDER — VANCOMYCIN HCL 125 MG PO CAPS: 125.0000 mg | ORAL_CAPSULE | Freq: Every day | ORAL | Status: DC

## 2023-02-25 MED ORDER — ACETYLCYSTEINE 600 MG PO CAPS: 1.0000 | ORAL_CAPSULE | Freq: Every day | ORAL | Status: DC

## 2023-02-25 MED ORDER — VANCOMYCIN HCL 125 MG PO CAPS: 125.0000 mg | ORAL_CAPSULE | Freq: Two times a day (BID) | ORAL | Status: DC

## 2023-02-25 MED ORDER — ALBUTEROL SULFATE HFA 108 (90 BASE) MCG/ACT IN AERS: 2.0000 | INHALATION_SPRAY | Freq: Four times a day (QID) | RESPIRATORY_TRACT | Status: DC | PRN

## 2023-02-25 MED ORDER — POTASSIUM CHLORIDE CRYS ER 20 MEQ PO TBCR
40.0000 meq | EXTENDED_RELEASE_TABLET | Freq: Two times a day (BID) | ORAL | Status: AC
  Administered 2023-02-25 (×2): 40 meq via ORAL
  Filled 2023-02-25 (×2): qty 2

## 2023-02-25 MED ORDER — BOOST PLUS PO LIQD: 237.0000 mL | Freq: Three times a day (TID) | ORAL | Status: DC

## 2023-02-25 MED ORDER — ADULT MULTIVITAMIN W/MINERALS CH
1.0000 | ORAL_TABLET | Freq: Every day | ORAL | Status: DC
  Administered 2023-02-26: 1 via ORAL
  Filled 2023-02-25: qty 1

## 2023-02-25 MED ORDER — METRONIDAZOLE 500 MG/100ML IV SOLN
500.0000 mg | Freq: Three times a day (TID) | INTRAVENOUS | Status: DC
  Administered 2023-02-25: 500 mg via INTRAVENOUS
  Filled 2023-02-25: qty 100

## 2023-02-25 MED ORDER — PREDNISONE 1 MG PO TABS: 3.0000 mg | ORAL_TABLET | Freq: Every day | ORAL | Status: DC

## 2023-02-25 MED ORDER — PREDNISONE 5 MG PO TABS
8.0000 mg | ORAL_TABLET | Freq: Every day | ORAL | Status: DC
  Administered 2023-02-25 – 2023-02-26 (×2): 8 mg via ORAL
  Filled 2023-02-25 (×2): qty 3

## 2023-02-25 MED ORDER — ISOSORBIDE MONONITRATE ER 30 MG PO TB24
30.0000 mg | ORAL_TABLET | Freq: Every day | ORAL | Status: DC
  Administered 2023-02-25 – 2023-02-26 (×2): 30 mg via ORAL
  Filled 2023-02-25 (×2): qty 1

## 2023-02-25 MED ORDER — TRAZODONE HCL 50 MG PO TABS
150.0000 mg | ORAL_TABLET | Freq: Every day | ORAL | Status: DC
  Administered 2023-02-25: 150 mg via ORAL
  Filled 2023-02-25: qty 1

## 2023-02-25 MED ORDER — PANTOPRAZOLE SODIUM 40 MG PO TBEC
40.0000 mg | DELAYED_RELEASE_TABLET | Freq: Two times a day (BID) | ORAL | Status: DC
  Administered 2023-02-25: 40 mg via ORAL
  Filled 2023-02-25: qty 1

## 2023-02-25 MED ORDER — VANCOMYCIN HCL 125 MG PO CAPS
125.0000 mg | ORAL_CAPSULE | Freq: Four times a day (QID) | ORAL | Status: DC
  Administered 2023-02-25 – 2023-02-26 (×5): 125 mg via ORAL
  Filled 2023-02-25 (×6): qty 1

## 2023-02-25 MED ORDER — ONDANSETRON HCL 4 MG/2ML IJ SOLN
4.0000 mg | Freq: Four times a day (QID) | INTRAMUSCULAR | Status: DC | PRN
  Administered 2023-02-25: 4 mg via INTRAVENOUS
  Filled 2023-02-25: qty 2

## 2023-02-25 MED ORDER — BANATROL TF EN LIQD
60.0000 mL | Freq: Two times a day (BID) | ENTERAL | Status: DC
  Administered 2023-02-25 – 2023-02-26 (×3): 60 mL
  Filled 2023-02-25 (×3): qty 60

## 2023-02-25 NOTE — Consult Note (Addendum)
[facility]    Date of Admission:  02/24/2023     Reason for Consult: recurrent cdiff    Referring Provider: Ophelia Charter     Lines:  Peripheral iv's  Abx: 6/6-c fidaxomycin 6/6-c metronidazole   6/5 po vanc        Assessment: 87 yo female distant hx breast cancer, hx covid infection with self reported long covid syndrome (poor appetite/brain fog), hx cdiff 01/06/23 s/p 10 days vancomycin, admitted 02/24/23 for ongoing diarrhea/failure to thrive found to have recurrent cdiff   I don't have 4/17 testing but appears to be a gi pathogen panel pcr done with Dr Lynann Bolog[facility]  Unclear patient's diarrhea history but seems to start around 12/2022 with intermittent abd pain. Unclear if diarrhea improves with the initial cdiff treatment.   This admission Jodi Valdez had marked leukocytosis and electrolytes deficiency and hypoalbuminemia, but no aki   Jodi Valdez doesn't appear to have any ileus/toxic megacolon.    Of note, Jodi Valdez does have a pancreatic head cystic mass as well that needs further w/u. If endocrine cancer could contribute to diarrhea as well (but clearly this episode is cdiff in nature)   Plan: I would treat this as recurrent cdiff with a long PO Vancomycin tapered course (better data for 2nd recurrence and on than fidaxomycin) over 6-8 weeks as follow: A)  vanc 125 mg po qid x14 days, then B)  vanc 125 mg po bid x1 week, then C)  vanc 125 mg po daily x1 week, then D)  vanc 125 mg po qod x2 week (or longer depending on how Jodi Valdez does) Given no toxic megacolon/ileus, I don't think metronidazole would add any beneficial effect, but will defer to our GI partner if they would like to keep Jodi Valdez has significant anxiety and could benefit from some anxiolytic Will sign off, but id team is available as needed Discussed with primary team      ------------------------------------------------ Principal Problem:   Recurrent colitis due to Clostridium  difficile Active Problems:   Essential hypertension   PERSONAL HX BREAST CANCER   Heme positive stool   Polymyalgia (HCC)   Hyperlipidemia   Acute urinary retention   Hypothyroidism   Generalized weakness    HPI: Jodi Valdez is a 87 y.o. female with past medical history significant for asthma, left breast cancer s/p radiation/chemo and mastectomy (2002), polymyalgia, presents for evaluation of diarrhea and failure to thrive.   Patient was very anxious and keeps saying "I don't think I am going to make it." And Jodi Valdez is also a poor historian  It appears though Jodi Valdez had covid 2 years prior and since has had "brain fog" and poor appetite  Jodi Valdez was seen by GI 01/06/23 for initial onset diarrhea and treated with 10 days po vanc for positive cdiff on gi pathogen pcr panel  It is unclear how severe the cdiff was in terms of diarrheal frequency/leukocytosis/aki at that time as I do not have labs value  It is unclear if her diarrhea ever improves in frequency  Jodi Valdez said Jodi Valdez is intermittently having abd cramping, generalized weakness, and ongoing diarrhea so Jodi Valdez came back for reevaluation  On presentation: Afebrile Cr 0.9 Lft normal Alb 2.7 Wbc 21 Abd ct showed diffuse colitis  Jodi Valdez was started on initially po vanc, and her wbc is improving  Today Jodi Valdez was changed to fidaxomicin and metronidazole  Family History  Problem Relation Age of Onset  Colon cancer Neg Hx    Colon polyps Neg Hx    Diabetes Neg Hx    Kidney disease Neg Hx    Esophageal cancer Neg Hx     Social History   Tobacco Use   Smoking status: Never   Smokeless tobacco: Never  Vaping Use   Vaping Use: Never used  Substance Use Topics   Alcohol use: No    Alcohol/week: 0.0 standard drinks of alcohol   Drug use: No    Allergies  Allergen Reactions   Propranolol Other (See Comments)    Hallucinations    Gabapentin Other (See Comments)    Hallucinations   Metformin And Related Diarrhea   Codeine  Nausea And Vomiting   Nitrofuran Derivatives Nausea And Vomiting and Other (See Comments)    GI Intolerance    Review of Systems: ROS All Other ROS was negative, except mentioned above   Past Medical History:  Diagnosis Date   Allergy    Anemia    Asthma    Breast cancer (HCC)    Cancer (HCC) 11/01/2000   left breast cancer   GERD (gastroesophageal reflux disease)    Heart disease    Hyperlipidemia    Hypertension    Long COVID    Personal history of chemotherapy    2002   Personal history of radiation therapy    2002   Polymyalgia (HCC)        Scheduled Meds:  albuterol  2.5 mg Nebulization TID   amLODipine  5 mg Oral BID   carvedilol  6.25 mg Oral BID WC   enoxaparin (LOVENOX) injection  40 mg Subcutaneous Q24H   feeding supplement  1 Container Oral TID BM   fiber supplement (BANATROL TF)  60 mL Per Tube BID   fidaxomicin  200 mg Oral BID   insulin aspart  0-5 Units Subcutaneous QHS   insulin aspart  0-9 Units Subcutaneous TID WC   isosorbide mononitrate  30 mg Oral Daily   levothyroxine  88 mcg Oral Q0600   [START ON 02/26/2023] multivitamin with minerals  1 tablet Oral Daily   pantoprazole  40 mg Oral BID AC   potassium chloride  40 mEq Oral BID   pravastatin  20 mg Oral Daily   predniSONE  8 mg Oral Q breakfast   traZODone  150 mg Oral QHS   Continuous Infusions:  sodium chloride 100 mL/hr at 02/24/23 2107   magnesium sulfate bolus IVPB     metronidazole 500 mg (02/25/23 1148)   PRN Meds:.acetaminophen **OR** acetaminophen, albuterol, hydrALAZINE, ondansetron (ZOFRAN) IV, oxyCODONE   OBJECTIVE: Blood pressure 127/74, pulse 93, temperature 98.3 F (36.8 C), temperature source Oral, resp. rate 16, SpO2 98 %.  Physical Exam General/constitutional: anxious, conversant, cooperative HEENT: Normocephalic, PER, Conj Clear, EOMI, Oropharynx clear Neck supple CV: rrr no mrg Lungs: clear to auscultation, normal respiratory effort Abd: Soft,  Nontender Ext: no edema Skin: No Rash Neuro: nonfocal MSK: no peripheral joint swelling/tenderness/warmth; back spines nontender  Gu: rectal tube in place liquid yellow stool    Lab Results Lab Results  Component Value Date   WBC 16.0 (H) 02/25/2023   HGB 10.7 (L) 02/25/2023   HCT 32.4 (L) 02/25/2023   MCV 96.7 02/25/2023   PLT 184 02/25/2023    Lab Results  Component Value Date   CREATININE 0.79 02/25/2023   BUN 12 02/25/2023   NA 131 (L) 02/25/2023   K 2.9 (L) 02/25/2023   CL 102 02/25/2023  CO2 22 02/25/2023    Lab Results  Component Value Date   ALT 24 02/25/2023   AST 21 02/25/2023   ALKPHOS 37 (L) 02/25/2023   BILITOT 0.5 02/25/2023      Microbiology: Recent Results (from the past 240 hour(s))  Blood culture (routine x 2)     Status: None (Preliminary result)   Collection Time: 02/24/23 12:18 PM   Specimen: BLOOD  Result Value Ref Range Status   Specimen Description   Final    BLOOD BLOOD LEFT ARM Performed at [facility], [address] [place], [place] [address]    Special Requests   Final    BOTTLES DRAWN AEROBIC AND ANAEROBIC Blood Culture adequate volume Performed at [facility], [address], [place], [place] [address]    Culture   Final    NO GROWTH < 24 HOURS Performed at [facility], [address] [place], [place] [address]    Report Status PENDING  Incomplete  Blood culture (routine x 2)     Status: None (Preliminary result)   Collection Time: 02/24/23 12:40 PM   Specimen: BLOOD  Result Value Ref Range Status   Specimen Description   Final    BLOOD BLOOD RIGHT WRIST Performed at [facility], [address] [place], [place] [address]    Special Requests   Final    BOTTLES DRAWN AEROBIC AND ANAEROBIC Blood Culture adequate volume Performed at [facility], [address], [place], [place] [address]    Culture   Final    NO GROWTH < 24  HOURS Performed at [facility], [address], [place], [place] [address]    Report Status PENDING  Incomplete  Gastrointestinal Panel by PCR , Stool     Status: None   Collection Time: 02/24/23  2:38 PM   Specimen: Stool  Result Value Ref Range Status   Campylobacter species NOT DETECTED NOT DETECTED Final   Plesimonas shigelloides NOT DETECTED NOT DETECTED Final   Salmonella species NOT DETECTED NOT DETECTED Final   Yersinia enterocolitica NOT DETECTED NOT DETECTED Final   Vibrio species NOT DETECTED NOT DETECTED Final   Vibrio cholerae NOT DETECTED NOT DETECTED Final   Enteroaggregative E coli (EAEC) NOT DETECTED NOT DETECTED Final   Enteropathogenic E coli (EPEC) NOT DETECTED NOT DETECTED Final   Enterotoxigenic E coli (ETEC) NOT DETECTED NOT DETECTED Final   Shiga like toxin producing E coli (STEC) NOT DETECTED NOT DETECTED Final   Shigella/Enteroinvasive E coli (EIEC) NOT DETECTED NOT DETECTED Final   Cryptosporidium NOT DETECTED NOT DETECTED Final   Cyclospora cayetanensis NOT DETECTED NOT DETECTED Final   Entamoeba histolytica NOT DETECTED NOT DETECTED Final   Giardia lamblia NOT DETECTED NOT DETECTED Final   Adenovirus F40/41 NOT DETECTED NOT DETECTED Final   Astrovirus NOT DETECTED NOT DETECTED Final   Norovirus GI/GII NOT DETECTED NOT DETECTED Final   Rotavirus A NOT DETECTED NOT DETECTED Final   Sapovirus (I, II, IV, and V) NOT DETECTED NOT DETECTED Final    Comment: Performed at [facility], [address] [place], [place] [address]  C Difficile Quick Screen w PCR reflex     Status: Abnormal   Collection Time: 02/24/23  2:38 PM   Specimen: Stool  Result Value Ref Range Status   C Diff antigen POSITIVE (A) NEGATIVE Final   C Diff toxin POSITIVE (A) NEGATIVE Final   C Diff interpretation Toxin producing  C. difficile detected.  Final    Comment: CRITICAL RESULT CALLED TO, READ BACK BY AND VERIFIED WITH: K.MORTON, RN AT 1936 ON 06.05.24 BY  N.[email or web address] Performed at [facility], [address], [place], [place] [address]      Serology:    Imaging: If present, new imagings (plain films, ct scans, and mri) have been personally visualized and interpreted; radiology reports have been reviewed. Decision making incorporated into the Impression / Recommendations.  6/5 abd pelv ct 1. Mural thickening and mucosal hyperenhancement involving nearly the entire colon, consistent with colitis. 2. 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 An adjacent cystic focus is also unchanged from recent studies but slowly enlarging in size from more remote studies. EUS/FNA can be considered given interval growth, however should be anchored to the patient's overall health and preferences and only if the patient is a surgical candidate. This recommendation follows ACR consensus guidelines: Management of Incidental Pancreatic Cysts: A White Paper of the ACR Incidental Findings Committee. J Am Coll Radiol 2017;14:911-923. 3.  Aortic Atherosclerosis  Raymondo Band, MD [facility] [phone] pager    02/25/2023, 12:20 PM

## 2023-02-25 NOTE — Progress Notes (Signed)
Initial Nutrition Assessment  DOCUMENTATION CODES:   Severe malnutrition in context of acute illness/injury  INTERVENTION:   Monitor magnesium, potassium, and phosphorus BID for at least 3 days, MD to replete as needed, as pt is at risk for refeeding syndrome.  -Boost Breeze po TID, each supplement provides 250 kcal and 9 grams of protein  -Multivitamin with minerals daily  -Banatrol fiber supplement BID x5 days, each provides 45 kcals, 2g protein and 5g soluble fiber. After 5 days, assess if supplement needs to continue.   NUTRITION DIAGNOSIS:   Severe Malnutrition related to acute illness (recurrent c.diff) as evidenced by severe fat depletion, severe muscle depletion, energy intake < or equal to 50% for > or equal to 5 days.  GOAL:   Patient will meet greater than or equal to 90% of their needs  MONITOR:   PO intake, Supplement acceptance, Labs, Weight trends, I & O's  REASON FOR ASSESSMENT:   Consult Assessment of nutrition requirement/status  ASSESSMENT:   87 y.o. female with PMH significant for HTN, HLD, left breast cancer s/p chemo/ radiation/mastectomy 2002, GERD, anemia, polymyalgia, pancreatic cyst  Patient presented to the ED today with complaint of progressively worsening diarrhea and failure to thrive.  Patient in room, very upset about situation. Appreciative of RD visit however. States she doesn't eat because she doesn't have an appetite d/t continued diarrhea. Pt reports she doesn't think she is going to make it. She is agreeable to taking soluble fiber supplement in hopes to bulk stool, also clear liquid protein supplement as well as daily MVI.  Pt states her diarrhea r/t C.diff has continued since April-May.   Pt reports UBW ~122 lbs in April prior to symptoms.  No weight for patient this admission.   Medications: KLOR-CON, IV Mg sulfate, Zofran  Labs reviewed: CBGs: 102-110 Low Na Low K Low Mg  C.diff +  NUTRITION - FOCUSED PHYSICAL  EXAM:  Flowsheet Row Most Recent Value  Orbital Region Severe depletion  Upper Arm Region Severe depletion  Thoracic and Lumbar Region Severe depletion  Buccal Region Moderate depletion  Temple Region Severe depletion  Clavicle Bone Region Severe depletion  Clavicle and Acromion Bone Region Severe depletion  Scapular Bone Region Severe depletion  Dorsal Hand Severe depletion  Patellar Region Severe depletion  Anterior Thigh Region Severe depletion  Posterior Calf Region Severe depletion  Edema (RD Assessment) None  Hair Reviewed  Eyes Reviewed  Mouth Reviewed  Skin Reviewed  [pale]  Nails Reviewed  [broken]       Diet Order:   Diet Order             Diet regular Fluid consistency: Thin  Diet effective now                   EDUCATION NEEDS:   Education needs have been addressed  Skin:  Skin Assessment: Reviewed RN Assessment  Last BM:  6/6 -rectal tube  Height:   Ht Readings from Last 1 Encounters:  01/06/23 5\' 3"  (1.6 m)    Weight:   Wt Readings from Last 1 Encounters:  01/06/23 56.7 kg    BMI:  There is no height or weight on file to calculate BMI.  Estimated Nutritional Needs:   Kcal:  1600-1800  Protein:  75-95g  Fluid:  >1.8L/day  Tilda Franco, MS, RD, LDN Inpatient Clinical Dietitian Contact information available via Amion

## 2023-02-25 NOTE — TOC Benefit Eligibility Note (Addendum)
Patient Product/process development scientist completed.    The patient is currently admitted and upon discharge could be taking Dificid 200 mg tablets.  The current 10 day co-pay is $1,455.75.   The patient is currently admitted and upon discharge could be taking Vancomycin 125 mg capsules.  The current 42 day co-pay is $219.88    The patient is insured through [facility] Part D   This test claim was processed through [facility]- copay amounts may vary at other pharmacies due to pharmacy/plan contracts, or as the patient moves through the different stages of their insurance plan.  Roland Earl, CPHT Pharmacy Patient Advocate Specialist [facility] Patient Advocate Team Direct Number: [phone]  Fax: [phone]

## 2023-02-25 NOTE — Hospital Course (Signed)
86yo with h/o HTN, HLD, L breast cancer s/p chemo/radiation/mastectomy (2002), and PMR who presented with recurrent diarrhea after prior C diff infection.  Current admission is thought to be related to acute colitis in the setting of recurrent C. Difficile infection.  CT with pancolitis.

## 2023-02-25 NOTE — Progress Notes (Addendum)
Progress Note   Patient: Jodi Valdez YNW:[id number] DOB: Oct 15, 1935 DOA: 02/24/2023     1 DOS: the patient was seen and examined on 02/25/2023   Brief hospital course: 86yo with h/o HTN, HLD, L breast cancer s/p chemo/radiation/mastectomy (2002), and PMR who presented with recurrent diarrhea after prior C diff infection.  Current admission is thought to be related to acute colitis in the setting of recurrent C. Difficile infection.  CT with pancolitis.    Assessment and Plan:   Pancolitis associated with recurrent C. difficile infection -Patient has almost 2 months history of persistent diarrhea, abdominal cramping.   -Was treated with 10-day course of oral vancomycin 6 weeks ago with some improvement but recurrence about a week ago -CT on admission showed pancolitis which is new from prior -GI consulted -Stool studies positive for recurrent C Diff -Given the persistence and volume of diarrhea, Flexi-Seal was placed -Heme positive stool is likely secondary to colitis -She is immunocompromised on chronic daily prednisone and so is at increased risk for infection -Based on prior treatment failure, she likely needs prolonged treatment with Vanc and/or Dificid and/or fecal transplant -ID consulted  Acute urinary retention -I/O cath prn for now, may need Foley -Consider addition of tamsulosin -She has had to have her urethra stretched twice in the past, per her husband, so this may not be all that surprising  Protein calorie malnutrition/Hypoalbuminemia -Albumin low at 2.7 secondary to GI loss and poor oral intake.  -Consult nutrition   Essential hypertension -Continue carvedilol, Imdur -Hold losartan for now   Hyperlipidemia -Continue Pravastatin   Hypothyroidism -Continue Synthroid   H/o left breast cancer  -s/p chemo/ radiation/mastectomy 2002   Polymyalgia -Patient is on daily prednisone and so immunocompromised -Continue prednisone   Pancreatic insufficiency,  cyst -CT abdomen showed previously known pancreatic cyst slowly enlarging in size -suspicious for intraductal papillary mucinous.   -Defer to GI for any need/urgency of EUS/FNA   Generalized weakness with ambulatory dysfunction -PT/OT consult  COVID-19 Long Hauler -Continue Pulmicort, albuterol -No longer taking Singulair -Has nocturnal O2 at home    Subjective: She is quite miserable, reports that her dog has been sitting at her bedside at home fearing that she will die and she thought she might.  Has Flexiseal in place with persistent diarrhea.  She has been unable to void in quite some time, s/p I/O cath this AM.    Physical Exam: Vitals:   02/24/23 2230 02/25/23 0235 02/25/23 0642 02/25/23 0918  BP: 106/62 121/70 127/74   Pulse: (!) 108 98 93   Resp: 16 16 16    Temp: 98.3 F (36.8 C) 99.1 F (37.3 C) 98.3 F (36.8 C)   TempSrc: Oral Oral Oral   SpO2: 92% 93% 93% 98%   General:  Appears frail, miserable Eyes:  EOMI, normal lids, iris ENT:  grossly normal hearing, lips & tongue, mmm Neck:  no LAD, masses or thyromegaly Cardiovascular:  RRR, no m/r/g. No LE edema.  Respiratory:   CTA bilaterally with no wheezes/rales/rhonchi.  Normal respiratory effort. Abdomen:  soft, diffusely TTP, ND Skin:  no rash or induration seen on limited exam Musculoskeletal:  grossly normal tone BUE/BLE, good ROM, no bony abnormality Psychiatric:  blunted mood and affect, speech fluent and appropriate, AOx3 Neurologic:  CN 2-12 grossly intact, moves all extremities in coordinated fashion   Radiological Exams on Admission: Independently reviewed - see discussion in A/P where applicable  CT ABDOMEN PELVIS W CONTRAST  Result Date: 02/24/2023 CLINICAL DATA:  Failure to thrive, lethargy, and left lower quadrant abdominal pain. Recent diagnosis of C diff colitis status post treatment. EXAM: CT ABDOMEN AND PELVIS WITH CONTRAST TECHNIQUE: Multidetector CT imaging of the abdomen and pelvis was  performed using the standard protocol following bolus administration of intravenous contrast. RADIATION DOSE REDUCTION: This exam was performed according to the departmental dose-optimization program which includes automated exposure control, adjustment of the mA and/or kV according to patient size and/or use of iterative reconstruction technique. CONTRAST:  OMNIPAQUE IOHEXOL 300 MG/ML  SOLN COMPARISON:  CT abdomen and pelvis dated 01/27/2023 FINDINGS: Lower chest: No focal consolidation or pulmonary nodule in the lung bases. No pleural effusion or pneumothorax demonstrated. Partially imaged heart size is normal. Hepatobiliary: No focal hepatic lesions. No intra or extrahepatic biliary ductal dilation. Normal gallbladder. 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 An adjacent cystic focus is also unchanged from recent studies but slowly enlarging in size from more remote studies. No main ductal dilation, mass lesion, or abnormal enhancement. Spleen: Normal in size without focal abnormality. Adrenals/Urinary Tract: No adrenal nodules. No suspicious renal mass, calculi or hydronephrosis. Pole simple left lower cyst. No specific follow-up imaging recommended. No focal bladder wall thickening. Stomach/Bowel: Normal appearance of the stomach. Mural thickening and mucosal hyperenhancement involving nearly the entire colon. No abnormal bowel dilation. Appendix is not discretely seen. Vascular/Lymphatic: Aortic atherosclerosis. No enlarged abdominal or pelvic lymph nodes. Reproductive: No adnexal masses. Other: No free fluid, fluid collection, or free air. Musculoskeletal: No acute or abnormal lytic or blastic osseous lesions. Multilevel degenerative changes of the partially imaged  thoracic and lumbar spine. Unchanged compression deformities of L1 and L2. Spine. IMPRESSION: 1. Mural thickening and mucosal hyperenhancement involving nearly the entire colon, consistent with colitis. 2. 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 An adjacent cystic focus is also unchanged from recent studies but slowly enlarging in size from more remote studies. EUS/FNA can be considered given interval growth, however should be anchored to the patient's overall health and preferences and only if the patient is a surgical candidate. This recommendation follows ACR consensus guidelines: Management of Incidental Pancreatic Cysts: A White Paper of the ACR Incidental Findings Committee. J Am Coll Radiol 2017;14:911-923. 3.  Aortic Atherosclerosis (ICD10-I70.0). Electronically Signed   By: Agustin Cree M.D.   On: 02/24/2023 15:58    EKG: Independently reviewed.  Sinus tachycardia with rate 125; nonspecific ST changes with no evidence of acute ischemia   Labs on Admission: I have personally reviewed the available labs and imaging studies at the time of the admission.  Pertinent labs:    Na++ 131 K+ 2.9 Glucose 109 Mag++ 1.6 Albumin 2.1 Lactate 1.4, 1.3 WBC 16 Hgb 10.7; 13.3 on 6/5 C diff positive FOBT positive  Family Communication: None present; I called her husband following AM evaluation  Disposition: Status is: Inpatient Remains inpatient appropriate because: need for ongoing management  Planned Discharge Destination: Home    Time spent: 50 minutes  Author: Jonah Blue, MD 02/25/2023 11:16 AM  For on call review [email or web address].

## 2023-02-25 NOTE — Progress Notes (Addendum)
Progress Note  Primary GI: Dr. Chales Abrahams  LOS: 1 day   Chief Complaint: C. difficile colitis   Subjective   No family was present at the time of my evaluation. Patient states she is not feeling well today.  Feels that the Flexi-Seal inserted is uncomfortable.  Feels overall fatigued and has some minimal lower abdominal discomfort   Objective   Vital signs in last 24 hours: Temp:  [97.9 F (36.6 C)-99.1 F (37.3 C)] 98.3 F (36.8 C) (06/06 1308) Pulse Rate:  [93-127] 93 (06/06 0642) Resp:  [13-21] 16 (06/06 0642) BP: (106-149)/(62-97) 127/74 (06/06 0642) SpO2:  [92 %-99 %] 98 % (06/06 0918)   Last BM recorded by nurses in past 5 days No data recorded  General:   female in no acute distress, ill-appearing Heart:  Regular rate and rhythm; no murmurs Pulm: Clear anteriorly; no wheezing Abdomen: soft, nondistended, normal bowel sounds in all quadrants. Nontender without guarding. No organomegaly appreciated. Extremities:  No edema Neurologic:  Alert and  oriented x4;  No focal deficits.  Psych:  Cooperative. Normal mood and affect.  Intake/Output from previous day: 06/05 0701 - 06/06 0700 In: 1000 [IV Piggyback:1000] Out: 35 [Stool:35] Intake/Output this shift: Total I/O In: -  Out: 900 [Urine:900]  Studies/Results: CT ABDOMEN PELVIS W CONTRAST  Result Date: 02/24/2023 CLINICAL DATA:  Failure to thrive, lethargy, and left lower quadrant abdominal pain. Recent diagnosis of C diff colitis status post treatment. EXAM: CT ABDOMEN AND PELVIS WITH CONTRAST TECHNIQUE: Multidetector CT imaging of the abdomen and pelvis was performed using the standard protocol following bolus administration of intravenous contrast. RADIATION DOSE REDUCTION: This exam was performed according to the departmental dose-optimization program which includes automated exposure control, adjustment of the mA and/or kV according to patient size and/or use of iterative reconstruction technique. CONTRAST:   OMNIPAQUE IOHEXOL 300 MG/ML  SOLN COMPARISON:  CT abdomen and pelvis dated 01/27/2023 FINDINGS: Lower chest: No focal consolidation or pulmonary nodule in the lung bases. No pleural effusion or pneumothorax demonstrated. Partially imaged heart size is normal. Hepatobiliary: No focal hepatic lesions. No intra or extrahepatic biliary ductal dilation. Normal gallbladder. 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 An adjacent cystic focus is also unchanged from recent studies but slowly enlarging in size from more remote studies. No main ductal dilation, mass lesion, or abnormal enhancement. Spleen: Normal in size without focal abnormality. Adrenals/Urinary Tract: No adrenal nodules. No suspicious renal mass, calculi or hydronephrosis. Pole simple left lower cyst. No specific follow-up imaging recommended. No focal bladder wall thickening. Stomach/Bowel: Normal appearance of the stomach. Mural thickening and mucosal hyperenhancement involving nearly the entire colon. No abnormal bowel dilation. Appendix is not discretely seen. Vascular/Lymphatic: Aortic atherosclerosis. No enlarged abdominal or pelvic lymph nodes. Reproductive: No adnexal masses. Other: No free fluid, fluid collection, or free air. Musculoskeletal: No acute or abnormal lytic or blastic osseous lesions. Multilevel degenerative changes of the partially imaged thoracic and lumbar spine. Unchanged compression deformities of L1 and L2. Spine. IMPRESSION: 1. Mural thickening and mucosal hyperenhancement involving nearly the entire colon, consistent with colitis. 2. Cystic focus in the pancreatic head measures 3.5 x 2.5 cm in coronal plane, not substantially changed from the most recent studies but slowly enlarging in size from 1.8  x 1.2  cm on 07/03/2014, likely branch side branch intraductal papillary mucinous neoplasms (IPMN). An adjacent cystic focus is also unchanged from recent studies but slowly enlarging in size from more remote studies. EUS/FNA can be considered given interval growth, however should be anchored to the patient's overall health and preferences and only if the patient is a surgical candidate. This recommendation follows ACR consensus guidelines: Management of Incidental Pancreatic Cysts: A White Paper of the ACR Incidental Findings Committee. J Am Coll Radiol 2017;14:911-923. 3.  Aortic Atherosclerosis (ICD10-I70.0). Electronically Signed   By: Agustin Cree M.D.   On: 02/24/2023 15:58    Lab Results: Recent Labs    02/24/23 1216 02/25/23 0532  WBC 21.0* 16.0*  HGB 13.3 10.7*  HCT 39.6 32.4*  PLT 207 184   BMET Recent Labs    02/24/23 1216 02/25/23 0532  NA 135 131*  K 3.7 2.9*  CL 102 102  CO2 22 22  GLUCOSE 157* 109*  BUN 16 12  CREATININE 0.94 0.79  CALCIUM 8.5* 7.3*   LFT Recent Labs    02/24/23 1216 02/25/23 0532  PROT 5.7* 4.5*  ALBUMIN 2.7* 2.1*  AST 21 21  ALT 28 24  ALKPHOS 43 37*  BILITOT 1.0 0.5  BILIDIR 0.1  --   IBILI 0.9  --    PT/INR No results for input(s): "LABPROT", "INR" in the last 72 hours.   Scheduled Meds:  albuterol  2.5 mg Nebulization TID   carvedilol  6.25 mg Oral BID WC   enoxaparin (LOVENOX) injection  40 mg Subcutaneous Q24H   fiber supplement (BANATROL TF)  60 mL Per Tube BID   fidaxomicin  200 mg Oral BID   insulin aspart  0-5 Units Subcutaneous QHS   insulin aspart  0-9 Units Subcutaneous TID WC   levothyroxine  88 mcg Oral Q0600   pravastatin  20 mg Oral Daily   Continuous Infusions:  sodium chloride 100 mL/hr at 02/24/23 2107     Impression:   C. difficile colitis -Leukocytosis 16.0, improved from 21 yesterday -Hgb 10.7, suspect hemodilutional s/p IVF -Potassium 2.9, sodium 131 -C. difficile antigen and toxin positive -GI  pathogen panel negative -Fecal occult positive, likely secondary to colitis -CRP 13.1 -CT abdomen pelvis with contrast shows colitis and stable known pancreatic cyst. Stool studies came back positive for C. difficile despite finishing 10 days of vancomycin therapy as an outpatient.  CT shows colitis.  Elevated CRP likely secondary to colitis as well as fecal occult likely secondary to colitis.    Plan:   -Replenish electrolytes, continue to monitor -Continue supportive care -Continue vancomycin.  Since infection is resistant after initial 10 day course, will have to do longer duration of vancomycin with taper. - If C. difficile is resistant to vancomycin taper, may have to consider Dificid versus fecal transplant (?) if no improvement after taper is complete. -Upon discharge will set up office visit to schedule colonoscopy as an outpatient sometime in the future.  Bayley Leanna Sato  02/25/2023, 9:55 AM     Attending physician's note   I have taken history, reviewed the chart and examined the patient. I performed a substantive portion of this encounter, including complete performance of at least one of the key components, in conjunction with the APP. I agree with the Advanced Practitioner's note, impression and recommendations.   C diff colitis (severe CDI- WBC>15)  Plan: -Appreciate ID input. -Pulsed vancomycin PO -Can stop IV Flagyl -Stop PPIs for now. If reflux, then restart QD -  Monitor and correct electrolytes. -?meds for assoc anxiety -Will see her tmr -FU GI as outpt for colon once better.   Edman Circle, MD [facility] [phone]

## 2023-02-25 NOTE — Telephone Encounter (Signed)
Chart reviewed. Pt currently admitted.

## 2023-02-25 NOTE — Progress Notes (Signed)
Post void residual 410 cc.  Foley catheter inserted at 2300.  500cc clear amber urine drained after insertion

## 2023-02-26 DIAGNOSIS — A0471 Enterocolitis due to Clostridium difficile, recurrent: Secondary | ICD-10-CM | POA: Diagnosis not present

## 2023-02-26 LAB — BASIC METABOLIC PANEL
Anion gap: 8 (ref 5–15)
BUN: 7 mg/dL — ABNORMAL LOW (ref 8–23)
CO2: 20 mmol/L — ABNORMAL LOW (ref 22–32)
Calcium: 7.4 mg/dL — ABNORMAL LOW (ref 8.9–10.3)
Chloride: 109 mmol/L (ref 98–111)
Creatinine, Ser: 0.84 mg/dL (ref 0.44–1.00)
GFR, Estimated: >60 mL/min (ref >60)
Glucose, Bld: 95 mg/dL (ref 70–99)
Potassium: 4.4 mmol/L (ref 3.5–5.1)
Sodium: 137 mmol/L (ref 135–145)

## 2023-02-26 LAB — GLUCOSE, CAPILLARY
Glucose-Capillary: 98 mg/dL (ref 70–99)
Glucose-Capillary: 205 mg/dL — ABNORMAL HIGH (ref 70–99)

## 2023-02-26 LAB — CBC WITH DIFFERENTIAL/PLATELET
Abs Immature Granulocytes: 0.53 10*3/uL — ABNORMAL HIGH (ref 0.00–0.07)
Basophils Absolute: 0.1 10*3/uL (ref 0.0–0.1)
Basophils Relative: 1 %
Eosinophils Absolute: 0.2 10*3/uL (ref 0.0–0.5)
Eosinophils Relative: 1 %
HCT: 31.8 % — ABNORMAL LOW (ref 36.0–46.0)
Hemoglobin: 10.4 g/dL — ABNORMAL LOW (ref 12.0–15.0)
Immature Granulocytes: 4 %
Lymphocytes Relative: 17 %
Lymphs Abs: 2.4 10*3/uL (ref 0.7–4.0)
MCH: 32 pg (ref 26.0–34.0)
MCHC: 32.7 g/dL (ref 30.0–36.0)
MCV: 97.8 fL (ref 80.0–100.0)
Monocytes Absolute: 1.3 10*3/uL — ABNORMAL HIGH (ref 0.1–1.0)
Monocytes Relative: 9 %
Neutro Abs: 9.5 10*3/uL — ABNORMAL HIGH (ref 1.7–7.7)
Neutrophils Relative %: 68 %
Platelets: 195 10*3/uL (ref 150–400)
RBC: 3.25 MIL/uL — ABNORMAL LOW (ref 3.87–5.11)
RDW: 13.5 % (ref 11.5–15.5)
WBC: 14 10*3/uL — ABNORMAL HIGH (ref 4.0–10.5)
nRBC: 0 % (ref 0.0–0.2)

## 2023-02-26 LAB — HEMOGLOBIN A1C
Hgb A1c MFr Bld: 7.4 % — ABNORMAL HIGH (ref 4.8–5.6)
Mean Plasma Glucose: 166 mg/dL

## 2023-02-26 LAB — CULTURE, BLOOD (ROUTINE X 2)

## 2023-02-26 MED ORDER — CHLORHEXIDINE GLUCONATE CLOTH 2 % EX PADS: 6.0000 | MEDICATED_PAD | Freq: Every day | CUTANEOUS | Status: DC

## 2023-02-26 MED ORDER — VANCOMYCIN HCL 125 MG PO CAPS: 125.0000 mg | ORAL_CAPSULE | Freq: Four times a day (QID) | ORAL | 0 refills | Status: DC

## 2023-02-26 MED ORDER — VANCOMYCIN HCL 125 MG PO CAPS: 125.0000 mg | ORAL_CAPSULE | ORAL | 0 refills | Status: AC

## 2023-02-26 MED ORDER — VANCOMYCIN HCL 125 MG PO CAPS: 125.0000 mg | ORAL_CAPSULE | Freq: Every day | ORAL | 0 refills | Status: AC

## 2023-02-26 MED ORDER — VANCOMYCIN HCL 125 MG PO CAPS: 125.0000 mg | ORAL_CAPSULE | Freq: Two times a day (BID) | ORAL | 0 refills | Status: AC

## 2023-02-26 NOTE — Progress Notes (Addendum)
Progress Note  Primary GI: Dr. Chales Abrahams  LOS: 2 days   Chief Complaint: C. difficile colitis   Subjective   No family was present at the time of my evaluation. Patient states she is doing "bad" today.  Very distressed with how many bowel movements she is having.  Denies abdominal pain, nausea, vomiting.  Feels very anxious   Objective   Vital signs in last 24 hours: Temp:  [97.7 F (36.5 C)-98.2 F (36.8 C)] 97.9 F (36.6 C) (06/07 0614) Pulse Rate:  [88-96] 94 (06/07 0614) Resp:  [14-16] 16 (06/07 0614) BP: (122-137)/(63-76) 137/76 (06/07 0614) SpO2:  [93 %-99 %] 97 % (06/07 0818) Last BM Date : 02/25/23 Last BM recorded by nurses in past 5 days Stool Type: Type 7 (Liquid consistency with no solid pieces) (02/25/2023  9:00 PM)  General:   female in no acute distress, anxious appearing Heart:  Regular rate and rhythm; no murmurs Pulm: Clear anteriorly; no wheezing Abdomen: soft, nondistended, normal bowel sounds in all quadrants. Nontender without guarding. No organomegaly appreciated. Extremities:  No edema Neurologic:  Alert and  oriented x4;  No focal deficits.  Psych:  Cooperative. Normal mood and affect.  Intake/Output from previous day: 06/06 0701 - 06/07 0700 In: 480 [P.O.:480] Out: 3050 [Urine:3050] Intake/Output this shift: No intake/output data recorded.  Studies/Results: CT ABDOMEN PELVIS W CONTRAST  Result Date: 02/24/2023 CLINICAL DATA:  Failure to thrive, lethargy, and left lower quadrant abdominal pain. Recent diagnosis of C diff colitis status post treatment. EXAM: CT ABDOMEN AND PELVIS WITH CONTRAST TECHNIQUE: Multidetector CT imaging of the abdomen and pelvis was performed using the standard protocol following bolus administration of intravenous contrast. RADIATION DOSE REDUCTION: This exam was performed according to the departmental dose-optimization program which includes automated exposure control, adjustment of the mA and/or kV according to patient  size and/or use of iterative reconstruction technique. CONTRAST:  OMNIPAQUE IOHEXOL 300 MG/ML  SOLN COMPARISON:  CT abdomen and pelvis dated 01/27/2023 FINDINGS: Lower chest: No focal consolidation or pulmonary nodule in the lung bases. No pleural effusion or pneumothorax demonstrated. Partially imaged heart size is normal. Hepatobiliary: No focal hepatic lesions. No intra or extrahepatic biliary ductal dilation. Normal gallbladder. 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 An adjacent cystic focus is also unchanged from recent studies but slowly enlarging in size from more remote studies. No main ductal dilation, mass lesion, or abnormal enhancement. Spleen: Normal in size without focal abnormality. Adrenals/Urinary Tract: No adrenal nodules. No suspicious renal mass, calculi or hydronephrosis. Pole simple left lower cyst. No specific follow-up imaging recommended. No focal bladder wall thickening. Stomach/Bowel: Normal appearance of the stomach. Mural thickening and mucosal hyperenhancement involving nearly the entire colon. No abnormal bowel dilation. Appendix is not discretely seen. Vascular/Lymphatic: Aortic atherosclerosis. No enlarged abdominal or pelvic lymph nodes. Reproductive: No adnexal masses. Other: No free fluid, fluid collection, or free air. Musculoskeletal: No acute or abnormal lytic or blastic osseous lesions. Multilevel degenerative changes of the partially imaged thoracic and lumbar spine. Unchanged compression deformities of L1 and L2. Spine. IMPRESSION: 1. Mural thickening and mucosal hyperenhancement involving nearly the entire colon, consistent with colitis. 2. 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 An adjacent cystic focus is also unchanged from recent studies but slowly enlarging in size from more remote studies. EUS/FNA can be considered given interval growth, however should be anchored to the patient's overall health and preferences and only if the patient is a surgical candidate. This recommendation follows ACR consensus guidelines: Management of Incidental Pancreatic Cysts: A White Paper of the ACR Incidental Findings Committee. J Am Coll Radiol 2017;14:911-923. 3.  Aortic Atherosclerosis (ICD10-I70.0). Electronically Signed   By: Agustin Cree M.D.   On: 02/24/2023 15:58    Lab Results: Recent Labs    02/24/23 1216 02/25/23 0532 02/26/23 0546  WBC 21.0* 16.0* 14.0*  HGB 13.3 10.7* 10.4*  HCT 39.6 32.4* 31.8*  PLT 207 184 195   BMET Recent Labs    02/24/23 1216 02/25/23 0532 02/26/23 0546  NA 135 131* 137  K 3.7 2.9* 4.4  CL 102 102 109  CO2 22 22 20*  GLUCOSE 157* 109* 95  BUN 16 12 7*  CREATININE 0.94 0.79 0.84  CALCIUM 8.5* 7.3* 7.4*   LFT Recent Labs    02/24/23 1216 02/25/23 0532  PROT 5.7* 4.5*  ALBUMIN 2.7* 2.1*  AST 21 21  ALT 28 24  ALKPHOS 43 37*  BILITOT 1.0 0.5  BILIDIR 0.1  --   IBILI 0.9  --    PT/INR No results for input(s): "LABPROT", "INR" in the last 72 hours.   Scheduled Meds:  albuterol  2.5 mg Nebulization TID   amLODipine  5 mg Oral BID   carvedilol  6.25 mg Oral BID WC   Chlorhexidine Gluconate Cloth  6 each Topical Daily   enoxaparin (LOVENOX) injection  40 mg Subcutaneous Q24H   feeding supplement  1 Container Oral TID BM   fiber supplement (BANATROL TF)  60 mL Per Tube BID   insulin aspart  0-5 Units Subcutaneous QHS   insulin aspart  0-9 Units Subcutaneous TID WC   isosorbide mononitrate  30 mg Oral Daily   levothyroxine  88 mcg Oral Q0600   multivitamin  with minerals  1 tablet Oral Daily   pravastatin  20 mg Oral Daily   predniSONE  8 mg Oral Q breakfast   traZODone  150 mg Oral QHS   vancomycin  125 mg Oral QID   Followed by   Melene Muller ON 03/11/2023] vancomycin  125 mg Oral BID   Followed by   Melene Muller ON 03/19/2023] vancomycin  125 mg Oral Daily   Followed by   Melene Muller ON 03/26/2023] vancomycin  125 mg Oral QODAY   Continuous Infusions:  sodium chloride 100 mL/hr at 02/24/23 2107      Patient profile:   87 year old female presenting with C. difficile colitis after recently being treated with vancomycin 125 4 times daily for 10 days (completed May 10th) for C. difficile noted as an outpatient   Impression:   C. difficile colitis -CT abdomen pelvis with contrast shows colitis and stable known pancreatic cyst -Alcohol positive, likely secondary to colitis -GI pathogen panel negative -C. difficile antigen and toxin positive -Hgb 10.4, stable, hemodilutional in the setting of IVF -WBC 14, improved -Potassium 4.4, sodium 137, improved Majority of symptoms likely related to C. difficile colitis.  However, if diarrhea does not improve after treatment with vancomycin and clearance of C. difficile could consider addition of pancreatic enzymes with pancreatic elastase being 141  in April.    Plan:   -Continue pulsed vancomycin p.o. -Continue to monitor electrolytes -Consider cholestyramine to help with diarrhea in the setting of C. Difficile. Use 2 hours before or 2 hours after administration of Vanc -Outpatient follow-up scheduled -Appreciate ID recommendations   Bevin Mayall Leanna Sato  02/26/2023, 9:40 AM

## 2023-02-26 NOTE — Evaluation (Signed)
Occupational Therapy Evaluation Patient Details Name: Jodi Valdez MRN: [id number] DOB: [birth year] Today's Date: 02/26/2023   History of Present Illness 87yo with h/o HTN, HLD, L breast cancer s/p chemo/radiation/mastectomy (2002), and PMR who presented with recurrent diarrhea after prior C diff infection. Current admission is thought to be related to acute colitis in the setting of recurrent C. Difficile infection. CT with pancolitis.   Clinical Impression   Patient evaluated by Occupational Therapy with no further acute OT wanted by pt or husband. Pt was agitated throughout session which increased when pt was invited to brush her teeth. Pt was generally assessed and while she does need overall light assistance, this appears to be her baseline for ~2 years. Spouse to room and declined any home health therapy as did pt. See below for any follow-up Occupational Therapy or equipment needs. OT is signing off. Thank you for this referral.       Recommendations for follow up therapy are one component of a multi-disciplinary discharge planning process, led by the attending physician.  Recommendations may be updated based on patient status, additional functional criteria and insurance authorization.   Assistance Recommended at Discharge Frequent or constant Supervision/Assistance  Patient can return home with the following A little help with bathing/dressing/bathroom;A little help with walking and/or transfers;Direct supervision/assist for medications management;Direct supervision/assist for financial management;Assist for transportation;Assistance with cooking/housework    Functional Status Assessment  Patient has had a recent decline in their functional status and/or demonstrates limited ability to make significant improvements in function in a reasonable and predictable amount of time  Equipment Recommendations  None recommended by OT    Recommendations for Other Services        Precautions / Restrictions Precautions Precautions: Fall Precaution Comments: Agitation Restrictions Weight Bearing Restrictions: No      Mobility Bed Mobility Overal bed mobility: Modified Independent                  Transfers                          Balance Overall balance assessment: Mild deficits observed, not formally tested                                         ADL either performed or assessed with clinical judgement   ADL Overall ADL's : Needs assistance/impaired       Grooming Details (indicate cue type and reason): Pt screamed at OT for offering to assist with pt brushing her teeth. Pt banged fists on bed stating, "You're driving me crazy!" Pt refused grooming. Upper Body Bathing: Minimal assistance;Sitting   Lower Body Bathing: Maximal assistance;Bed level   Upper Body Dressing : Minimal assistance;Sitting   Lower Body Dressing: Minimal assistance;Sit to/from stand Lower Body Dressing Details (indicate cue type and reason): Able to don and doff her slipped at EOB by using modified figure 4. Toilet Transfer: Hydrographic surveyor Details (indicate cue type and reason): Pt stood to RW with Min guard. Pt not following instructions for repositioning. Pt becoming incerasingly agitated and was invited to sit back on EOB and OT would excuse self to allow pt time to calm down. Husband in room. Pt refused return to supine and stated that she would stay at the EOB. Nsg notified,. Toileting- Clothing Manipulation and Hygiene: Maximal assistance Toileting - Clothing Manipulation Details (  indicate cue type and reason): Rolled in bed to don diaper for mobility. Pt endorses continued uncontrollable diarrhea.     Functional mobility during ADLs: Min guard;Supervision/safety;Cueing for safety;Rolling walker (2 wheels)       Vision   Vision Assessment?: No apparent visual deficits     Perception     Praxis      Pertinent  Vitals/Pain Pain Assessment Pain Assessment: No/denies pain (Only when having a bowel movement. nursing using creams per pt which has helped)     Hand Dominance Right   Extremity/Trunk Assessment Upper Extremity Assessment Upper Extremity Assessment: Generalized weakness   Lower Extremity Assessment Lower Extremity Assessment: Generalized weakness       Communication Communication Communication: No difficulties   Cognition Arousal/Alertness: Awake/alert Behavior During Therapy: WFL for tasks assessed/performed Overall Cognitive Status: Within Functional Limits for tasks assessed                                 General Comments: Does endorse Long COVD brain fog with decreased memory.     General Comments       Exercises     Shoulder Instructions      Home Living Family/patient expects to be discharged to:: Private residence Living Arrangements: Spouse/significant other   Type of Home: House Home Access: Stairs to enter [employer] of Steps: 4-5 Entrance Stairs-Rails: Can reach both Home Layout: One level     Bathroom Shower/Tub: Chief Strategy Officer: Standard (can push from vanity)     Home Equipment: Grab bars - tub/shower;Hand held shower head;Shower Counsellor (2 wheels);Wheelchair - manual;Cane - single point;BSC/3in1   Additional CommentsEmergency planning/management officer.      Prior Functioning/Environment Prior Level of Function : Needs assist  Cognitive Assist : Mobility (cognitive)     Physical Assist : Mobility (physical);ADLs (physical) Mobility (physical): Bed mobility;Transfers;Gait ADLs (physical): IADLs Mobility Comments: Uses her RW at all times. Reports a previous RT knee injury which affects her gait. ADLs Comments: Keeps BSC right beside her bed. Husband assists with empying BSC. Assists with bed mobility and transfers. Assists with all IADLs.  Pt's time line of baseline vs needs for assistance is unclear.  (Pt reports "brain fog" from Long COVID) Pt reports assistance for 2 years due to long COVID.  A friend also drops food off.        OT Problem List: Decreased cognition;Decreased safety awareness;Decreased activity tolerance;Decreased knowledge of use of DME or AE;Impaired balance (sitting and/or standing);Pain      OT Treatment/Interventions:      OT Goals(Current goals can be found in the care plan section) Acute Rehab OT Goals OT Goal Formulation: Patient unable to participate in goal setting (Discharge therapy)  OT Frequency:      Co-evaluation              AM-PAC OT "6 Clicks" Daily Activity     Outcome Measure Help from another person eating meals?: None Help from another person taking care of personal grooming?: A Little Help from another person toileting, which includes using toliet, bedpan, or urinal?: A Lot Help from another person bathing (including washing, rinsing, drying)?: A Lot Help from another person to put on and taking off regular upper body clothing?: A Little Help from another person to put on and taking off regular lower body clothing?: A Little 6 Click Score: 17   End of Session Equipment Utilized During  Treatment: Gait belt;Rolling walker (2 wheels) Nurse Communication: Other (comment) (Pt's response to OT. Pt sitting EOB with husband present.)  Activity Tolerance: Treatment limited secondary to agitation Patient left: with family/visitor present;with call bell/phone within reach (At EOB)  OT Visit Diagnosis: Unsteadiness on feet (R26.81);Other symptoms and signs involving cognitive function;Muscle weakness (generalized) (M62.81)                Time: 1010-1030 OT Time Calculation (min): 20 min Charges:  OT General Charges $OT Visit: 1 Visit OT Evaluation $OT Eval Low Complexity: 1 Low  Saoirse Legere, OT Acute Rehab Services Office: [phone] 02/26/2023  Theodoro Clock 02/26/2023, 10:39 AM

## 2023-02-26 NOTE — Discharge Summary (Signed)
Physician Discharge Summary  Jodi Valdez:[id number] DOB: Dec 03, 1935 DOA: 02/24/2023  PCP: Ailene Ravel, MD  Admit date: 02/24/2023  Discharge date: 02/26/2023  Admitted From:Home  Disposition:  Home  Recommendations for Outpatient Follow-up:  Follow up with PCP in 1-2 weeks Continue on Vancomycin taper as prescribed below Continue other home medications as prior  Home Health: None  Equipment/Devices: None  Discharge Condition:Stable  CODE STATUS: Full  Diet recommendation: Heart Healthy  Brief/Interim Summary:  86yo with h/o HTN, HLD, L breast cancer s/p chemo/radiation/mastectomy (2002), and PMR who presented with recurrent diarrhea after prior C diff infection. Current admission is thought to be related to acute colitis in the setting of recurrent C. Difficile infection. CT with pancolitis.  She has been seen by ID as well as GI with recommendations to start oral vancomycin taper over the course of 6 weeks as prescribed.  She is tolerating diet with improvement in her abdominal symptoms as well as diarrhea and is in stable condition for discharge today.  No other acute events noted throughout the course of this admission.  Discharge Diagnoses:  Principal Problem:   Recurrent colitis due to Clostridium difficile Active Problems:   Essential hypertension   PERSONAL HX BREAST CANCER   Heme positive stool   Polymyalgia (HCC)   Hyperlipidemia   Acute urinary retention   Hypothyroidism   Generalized weakness  Principal discharge diagnosis: Pancolitis associated with recurrent C. difficile infection.  Discharge Instructions  Discharge Instructions     Diet - low sodium heart healthy   Complete by: As directed    Increase activity slowly   Complete by: As directed       Allergies as of 02/26/2023       Reactions   Propranolol Other (See Comments)   Hallucinations   Gabapentin Other (See Comments)   Hallucinations   Metformin And Related Diarrhea    Codeine Nausea And Vomiting   Nitrofuran Derivatives Nausea And Vomiting, Other (See Comments)   GI Intolerance        Medication List     TAKE these medications    albuterol 108 (90 Base) MCG/ACT inhaler Commonly known as: VENTOLIN HFA Inhale 2 puffs into the lungs every 6 (six) hours as needed for wheezing or shortness of breath.   alendronate 70 MG tablet Commonly known as: FOSAMAX Take 70 mg by mouth every Monday.   amLODipine 5 MG tablet Commonly known as: NORVASC Take 5 mg by mouth 2 (two) times daily.   ascorbic acid 1000 MG tablet Commonly known as: VITAMIN C Take 1,000 mg by mouth daily.   budesonide 0.5 MG/2ML nebulizer solution Commonly known as: PULMICORT Use one vial in nebulizer twice daily as directed. What changed:  how much to take how to take this when to take this reasons to take this additional instructions   carvedilol 6.25 MG tablet Commonly known as: COREG Take 6.25 mg by mouth 2 (two) times daily with a meal.   EPINEPHrine 0.3 mg/0.3 mL Soaj injection Commonly known as: EPI-PEN Use as directed for life-threatening allergic reaction. What changed:  how much to take how to take this when to take this reasons to take this additional instructions   furosemide 40 MG tablet Commonly known as: LASIX TAKE 1 TABLET BY MOUTH EVERY DAY What changed:  when to take this reasons to take this   isosorbide mononitrate 30 MG 24 hr tablet Commonly known as: IMDUR Take 1 tablet (30 mg total) by mouth daily.  levothyroxine 88 MCG tablet Commonly known as: SYNTHROID Take 1 tablet (88 mcg total) by mouth daily. What changed: when to take this   losartan 50 MG tablet Commonly known as: COZAAR Take 50 mg by mouth daily.   NAC PO Take 1 capsule by mouth daily.   nitroGLYCERIN 0.4 MG SL tablet Commonly known as: NITROSTAT Place 1 tablet (0.4 mg total) under the tongue every 5 (five) minutes as needed for chest pain. MAX 3 DOSES IN 15  MINUTES. IF STILL PAIN CALL 911 What changed:  when to take this reasons to take this additional instructions   OXYGEN Inhale 5 L/min into the lungs as needed (for shortness of breath).   pantoprazole 40 MG tablet Commonly known as: Protonix Take one tablet by mouth twice daily as directed. What changed:  how much to take how to take this when to take this additional instructions   potassium chloride SA 20 MEQ tablet Commonly known as: KLOR-CON M Take 1 tablet (20 mEq total) by mouth daily. What changed:  when to take this additional instructions   pravastatin 20 MG tablet Commonly known as: PRAVACHOL Take 20 mg by mouth daily.   predniSONE 1 MG tablet Commonly known as: DELTASONE Take 3 mg by mouth daily.   predniSONE 5 MG tablet Commonly known as: DELTASONE Take 5 mg by mouth daily.   ONE DAILY MULTIVIT-MIN ADULT PO Take 1 tablet by mouth daily.   PRESERVISION/LUTEIN PO Take 1 capsule by mouth 2 (two) times daily. Unknown strength   Spacer/Aero-Holding Rudean Curt As directed What changed:  how much to take how to take this when to take this   traZODone 100 MG tablet Commonly known as: DESYREL Take 150 mg by mouth at bedtime.   vancomycin 125 MG capsule Commonly known as: VANCOCIN Take 1 capsule (125 mg total) by mouth 4 (four) times daily for 14 days.   vancomycin 125 MG capsule Commonly known as: VANCOCIN Take 1 capsule (125 mg total) by mouth 2 (two) times daily for 7 days. Start taking on: March 11, 2023   vancomycin 125 MG capsule Commonly known as: VANCOCIN Take 1 capsule (125 mg total) by mouth daily for 7 days. Start taking on: March 19, 2023   vancomycin 125 MG capsule Commonly known as: VANCOCIN Take 1 capsule (125 mg total) by mouth every other day for 14 days. Start taking on: March 26, 2023   Vitamin D-3 125 MCG (5000 UT) Tabs Take 5,000 Units by mouth 2 (two) times daily.        Follow-up Information     Hamrick, Durward Fortes,  MD. Schedule an appointment as soon as possible for a visit in 1 week(s).   Specialty: Family Medicine Contact information: [address] [address] [phone]                Allergies  Allergen Reactions   Propranolol Other (See Comments)    Hallucinations    Gabapentin Other (See Comments)    Hallucinations   Metformin And Related Diarrhea   Codeine Nausea And Vomiting   Nitrofuran Derivatives Nausea And Vomiting and Other (See Comments)    GI Intolerance    Consultations: GI ID   Procedures/Studies: CT ABDOMEN PELVIS W CONTRAST  Result Date: 02/24/2023 CLINICAL DATA:  Failure to thrive, lethargy, and left lower quadrant abdominal pain. Recent diagnosis of C diff colitis status post treatment. EXAM: CT ABDOMEN AND PELVIS WITH CONTRAST TECHNIQUE: Multidetector CT imaging of the abdomen  and pelvis was performed using the standard protocol following bolus administration of intravenous contrast. RADIATION DOSE REDUCTION: This exam was performed according to the departmental dose-optimization program which includes automated exposure control, adjustment of the mA and/or kV according to patient size and/or use of iterative reconstruction technique. CONTRAST:  OMNIPAQUE IOHEXOL 300 MG/ML  SOLN COMPARISON:  CT abdomen and pelvis dated 01/27/2023 FINDINGS: Lower chest: No focal consolidation or pulmonary nodule in the lung bases. No pleural effusion or pneumothorax demonstrated. Partially imaged heart size is normal. Hepatobiliary: No focal hepatic lesions. No intra or extrahepatic biliary ductal dilation. Normal gallbladder. 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 An adjacent cystic focus is also unchanged from recent studies but  slowly enlarging in size from more remote studies. No main ductal dilation, mass lesion, or abnormal enhancement. Spleen: Normal in size without focal abnormality. Adrenals/Urinary Tract: No adrenal nodules. No suspicious renal mass, calculi or hydronephrosis. Pole simple left lower cyst. No specific follow-up imaging recommended. No focal bladder wall thickening. Stomach/Bowel: Normal appearance of the stomach. Mural thickening and mucosal hyperenhancement involving nearly the entire colon. No abnormal bowel dilation. Appendix is not discretely seen. Vascular/Lymphatic: Aortic atherosclerosis. No enlarged abdominal or pelvic lymph nodes. Reproductive: No adnexal masses. Other: No free fluid, fluid collection, or free air. Musculoskeletal: No acute or abnormal lytic or blastic osseous lesions. Multilevel degenerative changes of the partially imaged thoracic and lumbar spine. Unchanged compression deformities of L1 and L2. Spine. IMPRESSION: 1. Mural thickening and mucosal hyperenhancement involving nearly the entire colon, consistent with colitis. 2. 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 An adjacent cystic focus is also unchanged from recent studies but slowly enlarging in size from more remote studies. EUS/FNA can be considered given interval growth, however should be anchored to the patient's overall health and preferences and only if the patient is a surgical candidate. This recommendation follows ACR consensus guidelines: Management of Incidental Pancreatic Cysts: A White Paper of the ACR Incidental Findings Committee. J Am Coll Radiol 2017;14:911-923. 3.  Aortic Atherosclerosis (ICD10-I70.0). Electronically Signed   By: Agustin Cree M.D.   On: 02/24/2023 15:58   CT Abdomen Pelvis W  Contrast  Result Date: 01/30/2023 CLINICAL DATA:  Lower abdominal pain, diarrhea, remote history of breast cancer EXAM: CT ABDOMEN AND PELVIS WITH CONTRAST TECHNIQUE: Multidetector CT imaging of the abdomen and pelvis was performed using the standard protocol following bolus administration of intravenous contrast. RADIATION DOSE REDUCTION: This exam was performed according to the departmental dose-optimization program which includes automated exposure control, adjustment of the mA and/or kV according to patient size and/or use of iterative reconstruction technique. CONTRAST:  75mL OMNIPAQUE IOHEXOL 300 MG/ML  SOLN COMPARISON:  PET-CT dated 08/18/2021 FINDINGS: Lower chest: Postsurgical changes in the left breast. Hepatobiliary: Liver is within normal limits. Gallbladder is unremarkable. No intrahepatic or extrahepatic dilatation. Pancreas: Dominant 2.5 cm unilocular cyst in the pancreatic head (series 2/image 38), favoring a pseudocyst or side branch IPMN. Spleen: Within normal limits. Adrenals/Urinary Tract: Adrenal glands are within normal limits. Kidneys are within normal limits.  No hydronephrosis. Low-lying  bladder, otherwise within normal limits. Stomach/Bowel: Stomach is within normal limits. No evidence of bowel obstruction. Appendix is not discretely visualized. No colonic wall thickening or inflammatory changes. Suspected rectal prolapse (series 2/image 92). Vascular/Lymphatic: No evidence of abdominal aortic aneurysm. Atherosclerotic calcifications of the abdominal aorta and branch vessels. No suspicious abdominopelvic lymphadenopathy. Reproductive: Status post hysterectomy. Bilateral ovaries are within normal limits. Other: No abdominopelvic ascites. Musculoskeletal: Mild superior endplate compression fracture deformities at T12 and L1, chronic. Mild degenerative changes at L5-S1. No focal osseous lesions. IMPRESSION: Global pelvic floor laxity with cystocele and rectal prolapse. No findings suspicious  for metastatic disease in the abdomen/pelvis. Stable 2.5 cm cyst in the pancreatic head. Given the patient's age, follow-up CT or MRI abdomen with/without contrast in 1 year is considered optional. Additional ancillary findings as above. Electronically Signed   By: Charline Bills M.D.   On: 01/30/2023 05:14     Discharge Exam: Vitals:   02/26/23 0614 02/26/23 0818  BP: 137/76   Pulse: 94   Resp: 16   Temp: 97.9 F (36.6 C)   SpO2: 95% 97%   Vitals:   02/25/23 1836 02/25/23 2000 02/26/23 0614 02/26/23 0818  BP:  122/73 137/76   Pulse:  88 94   Resp:  16 16   Temp:  97.7 F (36.5 C) 97.9 F (36.6 C)   TempSrc:  Oral Oral   SpO2: 97% 99% 95% 97%    General: Pt is alert, awake, not in acute distress Cardiovascular: RRR, S1/S2 +, no rubs, no gallops Respiratory: CTA bilaterally, no wheezing, no rhonchi Abdominal: Soft, NT, ND, bowel sounds + Extremities: no edema, no cyanosis    The results of significant diagnostics from this hospitalization (including imaging, microbiology, ancillary and laboratory) are listed below for reference.     Microbiology: Recent Results (from the past 240 hour(s))  Blood culture (routine x 2)     Status: None (Preliminary result)   Collection Time: 02/24/23 12:18 PM   Specimen: BLOOD  Result Value Ref Range Status   Specimen Description   Final    BLOOD BLOOD LEFT ARM Performed at [facility], [address] [place], [place] [address]    Special Requests   Final    BOTTLES DRAWN AEROBIC AND ANAEROBIC Blood Culture adequate volume Performed at [facility], [address] [place], [place] [address]    Culture   Final    NO GROWTH < 24 HOURS Performed at [facility], [address], [place], [place] [address]    Report Status PENDING  Incomplete  Blood culture (routine x 2)     Status: None (Preliminary result)   Collection Time: 02/24/23 12:40 PM   Specimen: BLOOD  Result Value Ref  Range Status   Specimen Description   Final    BLOOD BLOOD RIGHT WRIST Performed at [facility], [address] [place] [address], [place], [place] [address]    Special Requests   Final    BOTTLES DRAWN AEROBIC AND ANAEROBIC Blood Culture adequate volume Performed at [facility], [address], [place], [place] [address]    Culture   Final    NO GROWTH < 24 HOURS Performed at [facility], [address] [place] [address], [place], [place] [address]    Report Status PENDING  Incomplete  Gastrointestinal Panel by PCR , Stool     Status: None   Collection Time: 02/24/23  2:38 PM   Specimen: Stool  Result Value  Ref Range Status   Campylobacter species NOT DETECTED NOT DETECTED Final   Plesimonas shigelloides NOT DETECTED NOT DETECTED Final   Salmonella species NOT DETECTED NOT DETECTED Final   Yersinia enterocolitica NOT DETECTED NOT DETECTED Final   Vibrio species NOT DETECTED NOT DETECTED Final   Vibrio cholerae NOT DETECTED NOT DETECTED Final   Enteroaggregative E coli (EAEC) NOT DETECTED NOT DETECTED Final   Enteropathogenic E coli (EPEC) NOT DETECTED NOT DETECTED Final   Enterotoxigenic E coli (ETEC) NOT DETECTED NOT DETECTED Final   Shiga like toxin producing E coli (STEC) NOT DETECTED NOT DETECTED Final   Shigella/Enteroinvasive E coli (EIEC) NOT DETECTED NOT DETECTED Final   Cryptosporidium NOT DETECTED NOT DETECTED Final   Cyclospora cayetanensis NOT DETECTED NOT DETECTED Final   Entamoeba histolytica NOT DETECTED NOT DETECTED Final   Giardia lamblia NOT DETECTED NOT DETECTED Final   Adenovirus F40/41 NOT DETECTED NOT DETECTED Final   Astrovirus NOT DETECTED NOT DETECTED Final   Norovirus GI/GII NOT DETECTED NOT DETECTED Final   Rotavirus A NOT DETECTED NOT DETECTED Final   Sapovirus (I, II, IV, and V) NOT DETECTED NOT DETECTED Final    Comment: Performed at [facility], [address] [place], [place] [address]  C Difficile Quick Screen w PCR  reflex     Status: Abnormal   Collection Time: 02/24/23  2:38 PM   Specimen: Stool  Result Value Ref Range Status   C Diff antigen POSITIVE (A) NEGATIVE Final   C Diff toxin POSITIVE (A) NEGATIVE Final   C Diff interpretation Toxin producing C. difficile detected.  Final    Comment: CRITICAL RESULT CALLED TO, READ BACK BY AND VERIFIED WITH: K.MORTON, RN AT 1936 ON 06.05.24 BY N.THOMPSON Performed at [facility], [address], [place], [place] [address]      Labs: BNP (last 3 results) No results for input(s): "BNP" in the last 8760 hours. Basic Metabolic Panel: Recent Labs  Lab 02/24/23 1216 02/25/23 0532 02/26/23 0546  NA 135 131* 137  K 3.7 2.9* 4.4  CL 102 102 109  CO2 22 22 20*  GLUCOSE 157* 109* 95  BUN 16 12 7*  CREATININE 0.94 0.79 0.84  CALCIUM 8.5* 7.3* 7.4*  MG  --  1.6*  --   PHOS  --  3.2  --    Liver Function Tests: Recent Labs  Lab 02/24/23 1216 02/25/23 0532  AST 21 21  ALT 28 24  ALKPHOS 43 37*  BILITOT 1.0 0.5  PROT 5.7* 4.5*  ALBUMIN 2.7* 2.1*   Recent Labs  Lab 02/24/23 1216  LIPASE 26   No results for input(s): "AMMONIA" in the last 168 hours. CBC: Recent Labs  Lab 02/24/23 1216 02/25/23 0532 02/26/23 0546  WBC 21.0* 16.0* 14.0*  NEUTROABS  --   --  9.5*  HGB 13.3 10.7* 10.4*  HCT 39.6 32.4* 31.8*  MCV 95.4 96.7 97.8  PLT 207 184 195   Cardiac Enzymes: No results for input(s): "CKTOTAL", "CKMB", "CKMBINDEX", "TROPONINI" in the last 168 hours. BNP: Invalid input(s): "POCBNP" CBG: Recent Labs  Lab 02/25/23 0843 02/25/23 1131 02/25/23 1627 02/25/23 2153 02/26/23 0723  GLUCAP 102* 110* 180* 134* 98   D-Dimer No results for input(s): "DDIMER" in the last 72 hours. Hgb A1c Recent Labs    02/24/23 1216  HGBA1C 7.4*   Lipid Profile No results for input(s): "CHOL", "HDL", "LDLCALC", "TRIG", "CHOLHDL", "LDLDIRECT" in the last 72 hours. Thyroid function studies Recent Labs  02/24/23 1218  TSH  1.131   Anemia work up No results for input(s): "VITAMINB12", "FOLATE", "FERRITIN", "TIBC", "IRON", "RETICCTPCT" in the last 72 hours. Urinalysis    Component Value Date/Time   COLORURINE YELLOW 04/04/2022 0959   APPEARANCEUR CLEAR 04/04/2022 0959   LABSPEC 1.010 04/04/2022 0959   LABSPEC 1.005 07/08/2007 1555   PHURINE 6.0 04/04/2022 0959   GLUCOSEU NEGATIVE 04/04/2022 0959   GLUCOSEU NEGATIVE 07/16/2014 1120   HGBUR NEGATIVE 04/04/2022 0959   BILIRUBINUR NEGATIVE 04/04/2022 0959   BILIRUBINUR Negative 07/08/2007 1555   KETONESUR NEGATIVE 04/04/2022 0959   PROTEINUR NEGATIVE 04/04/2022 0959   UROBILINOGEN 0.2 09/26/2014 1130   NITRITE NEGATIVE 04/04/2022 0959   LEUKOCYTESUR MODERATE (A) 04/04/2022 0959   LEUKOCYTESUR Negative 07/08/2007 1555   Sepsis Labs Recent Labs  Lab 02/24/23 1216 02/25/23 0532 02/26/23 0546  WBC 21.0* 16.0* 14.0*   Microbiology Recent Results (from the past 240 hour(s))  Blood culture (routine x 2)     Status: None (Preliminary result)   Collection Time: 02/24/23 12:18 PM   Specimen: BLOOD  Result Value Ref Range Status   Specimen Description   Final    BLOOD BLOOD LEFT ARM Performed at [facility], [address] [place], [place] [address]    Special Requests   Final    BOTTLES DRAWN AEROBIC AND ANAEROBIC Blood Culture adequate volume Performed at [facility], [address] [place], [place] [address]    Culture   Final    NO GROWTH < 24 HOURS Performed at [facility], [address], [place], [place] [address]    Report Status PENDING  Incomplete  Blood culture (routine x 2)     Status: None (Preliminary result)   Collection Time: 02/24/23 12:40 PM   Specimen: BLOOD  Result Value Ref Range Status   Specimen Description   Final    BLOOD BLOOD RIGHT WRIST Performed at [facility], [address], [place], [place] [address]    Special Requests   Final    BOTTLES  DRAWN AEROBIC AND ANAEROBIC Blood Culture adequate volume Performed at [facility], [address] [place], [place] [address]    Culture   Final    NO GROWTH < 24 HOURS Performed at [facility], [address], [place], [place] [address]    Report Status PENDING  Incomplete  Gastrointestinal Panel by PCR , Stool     Status: None   Collection Time: 02/24/23  2:38 PM   Specimen: Stool  Result Value Ref Range Status   Campylobacter species NOT DETECTED NOT DETECTED Final   Plesimonas shigelloides NOT DETECTED NOT DETECTED Final   Salmonella species NOT DETECTED NOT DETECTED Final   Yersinia enterocolitica NOT DETECTED NOT DETECTED Final   Vibrio species NOT DETECTED NOT DETECTED Final   Vibrio cholerae NOT DETECTED NOT DETECTED Final   Enteroaggregative E coli (EAEC) NOT DETECTED NOT DETECTED Final   Enteropathogenic E coli (EPEC) NOT DETECTED NOT DETECTED Final   Enterotoxigenic E coli (ETEC) NOT DETECTED NOT DETECTED Final   Shiga like toxin producing E coli (STEC) NOT DETECTED NOT DETECTED Final   Shigella/Enteroinvasive E coli (EIEC) NOT DETECTED NOT DETECTED Final   Cryptosporidium NOT DETECTED NOT DETECTED Final   Cyclospora cayetanensis NOT DETECTED NOT DETECTED Final   Entamoeba histolytica NOT DETECTED NOT DETECTED Final   Giardia lamblia NOT DETECTED NOT DETECTED Final   Adenovirus F40/41 NOT DETECTED NOT DETECTED  Final   Astrovirus NOT DETECTED NOT DETECTED Final   Norovirus GI/GII NOT DETECTED NOT DETECTED Final   Rotavirus A NOT DETECTED NOT DETECTED Final   Sapovirus (I, II, IV, and V) NOT DETECTED NOT DETECTED Final    Comment: Performed at [facility], [address] [place], [place] [address]  C Difficile Quick Screen w PCR reflex     Status: Abnormal   Collection Time: 02/24/23  2:38 PM   Specimen: Stool  Result Value Ref Range Status   C Diff antigen POSITIVE (A) NEGATIVE Final   C Diff toxin POSITIVE (A) NEGATIVE Final    C Diff interpretation Toxin producing C. difficile detected.  Final    Comment: CRITICAL RESULT CALLED TO, READ BACK BY AND VERIFIED WITH: K.MORTON, RN AT 1936 ON 06.05.24 BY N.THOMPSON Performed at [facility], [address] [place], [place] [address]      Time coordinating discharge: 35 minutes  SIGNED:   Erick Blinks, DO [facility] 02/26/2023, 9:10 AM  If 7PM-7AM, please contact night-coverage [email or web address]

## 2023-02-26 NOTE — Evaluation (Signed)
Physical Therapy Evaluation Patient Details Name: Jodi Valdez MRN: [id number] DOB: 06-02-1936 Today's Date: 02/26/2023  History of Present Illness  Pt is 87yo who presented on 02/24/23 with recurrent diarrhea after prior C diff infection. Current admission is thought to be related to acute colitis in the setting of recurrent C. Difficile infection. CT with pancolitis. Pt with h/o HTN, HLD, L breast cancer s/p chemo/radiation/mastectomy (2002), and PMR  Clinical Impression  Pt admitted with above diagnosis. At baseline, pt resides with spouse and is ambulatory with RW.  She has chronic R knee injury that limits - reviewed prior imaging that demonstrated OA.  Today, pt was mod I for bed mobility and ambulated 100' with RW and supervision.  She did fatigue easily and did need min cues for RW management initially.  Pt has decreased R knee flexion and weight shift with gait.  Pt with mild decline from baseline and will benefit from acute PT while hospitalized.  Pt declines need for HHPT, PT agrees.  Did educated on outpt PT or aquatic therapy in the future for her R Knee pain if desires. Pt currently with functional limitations due to the deficits listed below (see PT Problem List). Pt will benefit from acute skilled PT to increase their independence and safety with mobility to allow discharge.          Recommendations for follow up therapy are one component of a multi-disciplinary discharge planning process, led by the attending physician.  Recommendations may be updated based on patient status, additional functional criteria and insurance authorization.  Follow Up Recommendations       Assistance Recommended at Discharge PRN  Patient can return home with the following  Assistance with cooking/housework;Help with stairs or ramp for entrance    Equipment Recommendations None recommended by PT  Recommendations for Other Services       Functional Status Assessment Patient has had a recent  decline in their functional status and demonstrates the ability to make significant improvements in function in a reasonable and predictable amount of time.     Precautions / Restrictions Precautions Precautions: Fall Precaution Comments: - Restrictions Weight Bearing Restrictions: No      Mobility  Bed Mobility Overal bed mobility: Modified Independent Bed Mobility: Supine to Sit, Sit to Supine     Supine to sit: Modified independent (Device/Increase time) Sit to supine: Modified independent (Device/Increase time)        Transfers Overall transfer level: Needs assistance Equipment used: Rolling walker (2 wheels) Transfers: Sit to/from Stand Sit to Stand: Supervision                Ambulation/Gait Ambulation/Gait assistance: Supervision, Min guard Gait Distance (Feet): 100 Feet Assistive device: Rolling walker (2 wheels) Gait Pattern/deviations: Step-through pattern, Decreased stride length, Decreased weight shift to right Gait velocity: decreased     General Gait Details: Min guard progressed to supervision; fatigued easily; ambulated with decreased knee flexion on R with tendency to circumduct  Stairs            Wheelchair Mobility    Modified Rankin (Stroke Patients Only)       Balance Overall balance assessment: Needs assistance Sitting-balance support: No upper extremity supported Sitting balance-Leahy Scale: Good     Standing balance support: Bilateral upper extremity supported, No upper extremity supported Standing balance-Leahy Scale: Fair Standing balance comment: RW to ambulate but could stand without support  Pertinent Vitals/Pain Pain Assessment Pain Assessment: Faces Faces Pain Scale: Hurts a little bit Pain Location: R knee with ambulation Pain Descriptors / Indicators: Discomfort Pain Intervention(s): Limited activity within patient's tolerance, Monitored during session    Home Living  Family/patient expects to be discharged to:: Private residence Living Arrangements: Spouse/significant other Available Help at Discharge: Family;Available 24 hours/day Type of Home: House Home Access: Stairs to enter Entrance Stairs-Rails: Can reach both Entrance Stairs-Number of Steps: 4-5   Home Layout: One level Home Equipment: Grab bars - tub/shower;Hand held shower head;Shower Counsellor (2 wheels);Wheelchair - manual;Cane - single point;BSC/3in1 Additional CommentsEmergency planning/management officer.    Prior Function Prior Level of Function : Needs assist  Cognitive Assist : Mobility (cognitive)     Physical Assist : Mobility (physical);ADLs (physical) Mobility (physical): Bed mobility;Transfers;Gait ADLs (physical): IADLs Mobility Comments: Uses her RW at all times. Reports a previous RT knee injury ~ 1 year ago which affects her gait. Denies falls ADLs Comments: Keeps BSC right beside her bed. Husband assists with empying BSC. Assists with bed mobility and transfers. Assists with all IADLs.  Pt's time line of baseline vs needs for assistance is unclear. (Pt reports "brain fog" from Long COVID) Pt reports assistance for 2 years due to long COVID.  A friend also drops food off.     Hand Dominance   Dominant Hand: Right    Extremity/Trunk Assessment   Upper Extremity Assessment Upper Extremity Assessment: Defer to OT evaluation    Lower Extremity Assessment Lower Extremity Assessment: RLE deficits/detail;LLE deficits/detail RLE Deficits / Details: Pt reports R knee injury ~ 1 year ago.  States had imaging and no fractures/dislocations/tears.  ROM: WFL; MMT: ankle 5/5, knee gentle testing with 4/5 strength; hip 4/5; R knee appears larger than left and with audiable crepitus (noted prior imaging revealed OA). Pt endorses having cortisone injection that helped some in past LLE Deficits / Details: ROM WFL; MMT 5/5    Cervical / Trunk Assessment Cervical / Trunk Assessment: Normal   Communication   Communication: No difficulties  Cognition Arousal/Alertness: Awake/alert Behavior During Therapy: WFL for tasks assessed/performed Overall Cognitive Status: Within Functional Limits for tasks assessed                                 General Comments: Does endorse Long COVD brain fog with decreased memory.        General Comments      Exercises     Assessment/Plan    PT Assessment Patient needs continued PT services  PT Problem List Decreased strength;Decreased balance;Pain;Decreased mobility;Decreased activity tolerance       PT Treatment Interventions DME instruction;Balance training;Patient/family education;Gait training;Therapeutic activities;Stair training;Modalities;Functional mobility training;Therapeutic exercise    PT Goals (Current goals can be found in the Care Plan section)  Acute Rehab PT Goals Patient Stated Goal: return home PT Goal Formulation: With patient Time For Goal Achievement: 03/12/23 Potential to Achieve Goals: Good    Frequency Min 1X/week     Co-evaluation               AM-PAC PT "6 Clicks" Mobility  Outcome Measure Help needed turning from your back to your side while in a flat bed without using bedrails?: None Help needed moving from lying on your back to sitting on the side of a flat bed without using bedrails?: None Help needed moving to and from a bed to a chair (including a wheelchair)?:  A Little Help needed standing up from a chair using your arms (e.g., wheelchair or bedside chair)?: A Little Help needed to walk in hospital room?: A Little Help needed climbing 3-5 steps with a railing? : A Little 6 Click Score: 20    End of Session Equipment Utilized During Treatment: Gait belt Activity Tolerance: Patient tolerated treatment well Patient left: in bed;with call bell/phone within reach;with bed alarm set Nurse Communication: Mobility status PT Visit Diagnosis: Other abnormalities of gait and  mobility (R26.89);Pain Pain - Right/Left: Right Pain - part of body: Knee    Time: 1610-9604 PT Time Calculation (min) (ACUTE ONLY): 19 min   Charges:   PT Evaluation $PT Eval Low Complexity: 1 Low          Saksham Akkerman, PT Acute Rehab [facility] [phone]   Rayetta Humphrey 02/26/2023, 11:18 AM

## 2023-02-26 NOTE — TOC CM/SW Note (Signed)
Transition of Care [facility]) - Inpatient Brief Assessment   Patient Details  Name: Jodi Valdez MRN: [id number] Date of Birth: Jul 12, 1936  [facility] of [facility]) CM/SW Contact:    Otelia Santee, LCSW Phone Number: 02/26/2023, 9:18 AM   Clinical Narrative: Pt to return home. No TOC needs identified.    Transition of Care Asessment: Insurance and Status: Insurance coverage has been reviewed Patient has primary care physician: Yes Home environment has been reviewed: Home w/ spouse Prior level of function:: Independent/modified independent Prior/Current Home Services: No current home services Social Determinants of Health Reivew: SDOH reviewed no interventions necessary Readmission risk has been reviewed: Yes Transition of care needs: no transition of care needs at this time

## 2023-02-27 LAB — CULTURE, BLOOD (ROUTINE X 2): Culture: NO GROWTH

## 2023-02-28 LAB — CULTURE, BLOOD (ROUTINE X 2)
Culture: NO GROWTH
Special Requests: ADEQUATE
Special Requests: ADEQUATE

## 2023-03-01 LAB — CULTURE, BLOOD (ROUTINE X 2)

## 2023-03-08 ENCOUNTER — Ambulatory Visit: Payer: Medicare PPO | Admitting: Cardiology

## 2023-03-09 ENCOUNTER — Telehealth: Payer: Self-pay | Admitting: Gastroenterology

## 2023-03-09 NOTE — Telephone Encounter (Signed)
Pt husband Jomarie Longs  questioned if he should skip a day of the vancomycin between tapering dosage. Pt was notified to not skip a day and to take the medication as prescribed without skipping a dose. Jomarie Longs stated that pt is still having tiredness and questioning if this could be coming from the medication. Chart reviewed and noted that the pt had tiredness prior to starting the medication. Jomarie Longs  verbalized understanding with all questions answered.

## 2023-03-09 NOTE — Telephone Encounter (Signed)
PT spouse calling to speak about vancomycin and the side effects. She is experiencing extreme fatigue and they are concerned. Please advise.

## 2023-03-11 DIAGNOSIS — A498 Other bacterial infections of unspecified site: Secondary | ICD-10-CM | POA: Diagnosis not present

## 2023-03-17 DIAGNOSIS — J449 Chronic obstructive pulmonary disease, unspecified: Secondary | ICD-10-CM | POA: Diagnosis not present

## 2023-03-18 ENCOUNTER — Ambulatory Visit: Payer: Medicare PPO | Admitting: Gastroenterology

## 2023-03-26 DIAGNOSIS — J9611 Chronic respiratory failure with hypoxia: Secondary | ICD-10-CM | POA: Diagnosis not present

## 2023-04-06 ENCOUNTER — Telehealth: Payer: Self-pay | Admitting: Gastroenterology

## 2023-04-06 NOTE — Telephone Encounter (Signed)
PT w/ husband called to find out if she needs to be tested again for c. Diff before appointment on 9/9. Requesting call back.

## 2023-04-06 NOTE — Telephone Encounter (Signed)
Pt husband Jomarie Longs questioned if the pt needed to be retested for the Cdiff.  Jomarie Longs was notified that typically we wont retest unless her symptoms reoccur: Jomarie Longs stated that her symptoms and diarrhea are much better.  Jomarie Longs notified to call our office if symptoms reappear. Jomarie Longs verbalized understanding with all questions answered.

## 2023-04-09 ENCOUNTER — Other Ambulatory Visit: Payer: Self-pay | Admitting: Cardiology

## 2023-04-14 DIAGNOSIS — R5381 Other malaise: Secondary | ICD-10-CM | POA: Diagnosis not present

## 2023-04-14 DIAGNOSIS — N3091 Cystitis, unspecified with hematuria: Secondary | ICD-10-CM | POA: Diagnosis not present

## 2023-04-14 DIAGNOSIS — R300 Dysuria: Secondary | ICD-10-CM | POA: Diagnosis not present

## 2023-04-14 NOTE — Telephone Encounter (Signed)
Patients husband called stating that patients symptoms have reappeared and is requesting to speak to nurse. Husband stated that he does not want patient to wait to be seen on 9/9. Please advise.

## 2023-04-14 NOTE — Telephone Encounter (Signed)
Spoke with Pt husband Jomarie Longs in regard to pt symptoms. Jomarie Longs stated that the pt is concerned that she has Cdiff again and wants to get retested. Jomarie Longs stated that the pt BM are formed. 1-2 BM per day. No GI complaints, No diarrhea, Pt chief complaints is severe fatigue. Jomarie Longs was encouraged to reach out to her PCP or urgent care if that is her main complaint since it is not GI related. Jomarie Longs requested that Dr. Chales Abrahams be made aware of her current symptoms:  Please advise

## 2023-04-16 ENCOUNTER — Other Ambulatory Visit: Payer: Medicare PPO

## 2023-04-16 ENCOUNTER — Other Ambulatory Visit: Payer: Self-pay

## 2023-04-16 DIAGNOSIS — R5383 Other fatigue: Secondary | ICD-10-CM

## 2023-04-16 DIAGNOSIS — A498 Other bacterial infections of unspecified site: Secondary | ICD-10-CM

## 2023-04-16 DIAGNOSIS — J449 Chronic obstructive pulmonary disease, unspecified: Secondary | ICD-10-CM | POA: Diagnosis not present

## 2023-04-16 DIAGNOSIS — Z8619 Personal history of other infectious and parasitic diseases: Secondary | ICD-10-CM

## 2023-04-16 NOTE — Addendum Note (Signed)
Addended by: Sharyon Medicus H on: 04/16/2023 11:48 AM   Modules accepted: Orders

## 2023-04-16 NOTE — Telephone Encounter (Signed)
lets retest her stool for C. Difficile- PCR/toxin  RG

## 2023-04-16 NOTE — Telephone Encounter (Signed)
Spoke with patient & husband and advised them to come in to pick up stool kit at earliest convenience. Husband asked that I make Dr. Chales Abrahams aware that patient was seen at urgent care yesterday & was prescribed bactrim for 1 week for a kidney infection.

## 2023-04-19 ENCOUNTER — Ambulatory Visit: Payer: Medicare PPO

## 2023-04-19 ENCOUNTER — Telehealth: Payer: Self-pay | Admitting: Gastroenterology

## 2023-04-19 DIAGNOSIS — R5383 Other fatigue: Secondary | ICD-10-CM

## 2023-04-19 DIAGNOSIS — Z8619 Personal history of other infectious and parasitic diseases: Secondary | ICD-10-CM

## 2023-04-19 NOTE — Telephone Encounter (Signed)
Patient spouse is calling back in regards Cdiff , states medication did not work .Marland KitchenPlease advise

## 2023-04-19 NOTE — Telephone Encounter (Signed)
error 

## 2023-04-19 NOTE — Telephone Encounter (Signed)
Please wait for stool studies today Minimize other antibiotics RG

## 2023-04-20 ENCOUNTER — Telehealth: Payer: Self-pay | Admitting: Gastroenterology

## 2023-04-20 NOTE — Telephone Encounter (Signed)
Inbound call from patient husband requesting a call back to discuss recent stool test results. States they are worried about results. Please advise, thank you.

## 2023-04-21 NOTE — Telephone Encounter (Signed)
Pt husband Jomarie Longs  wanted to discuss the results of the resent stool study test. Jomarie Longs  stated that he finished the coarse of medication on July 18th and the stool is now soft not liquid and as bad as it was. Jomarie Longs  was notified that as soon as the test has been reviewed by Dr. Chales Abrahams then we will reach out to the pt.  Please review stool test and advise

## 2023-04-22 ENCOUNTER — Other Ambulatory Visit: Payer: Self-pay

## 2023-04-22 MED ORDER — CHOLESTYRAMINE 4 G PO PACK: 4.0000 g | PACK | Freq: Every day | ORAL | 6 refills | Status: DC

## 2023-04-22 NOTE — Telephone Encounter (Signed)
Stool for C. difficile negative This is good news  Trial of cholestyramine 4 g p.o. daily, take it 2 hours before or after rest of the medications #30,6RF Can use Imodium AD 1 tablet p.o. 3 times daily as needed Let us know how she is in 2 weeks If still with problems, further workup  RG

## 2023-04-22 NOTE — Telephone Encounter (Signed)
Spoke with patient's husband regarding recommendations. He also mentioned that Dr. Chales Abrahams discussed this with him earlier this morning as well. Prescription sent to pharmacy. They will update [place] in a couple weeks.

## 2023-04-22 NOTE — Telephone Encounter (Signed)
Refer to phone note 04/20/23.

## 2023-04-26 ENCOUNTER — Encounter (HOSPITAL_COMMUNITY): Payer: Self-pay

## 2023-04-26 ENCOUNTER — Emergency Department (HOSPITAL_COMMUNITY): Payer: Medicare PPO

## 2023-04-26 ENCOUNTER — Telehealth: Payer: Self-pay | Admitting: Gastroenterology

## 2023-04-26 ENCOUNTER — Other Ambulatory Visit: Payer: Self-pay

## 2023-04-26 ENCOUNTER — Inpatient Hospital Stay (HOSPITAL_COMMUNITY)
Admission: EM | Admit: 2023-04-26 | Discharge: 2023-05-01 | DRG: 872 | Disposition: A | Discharge status: Home / Self Care | Payer: Medicare PPO | Attending: Internal Medicine | Admitting: Internal Medicine

## 2023-04-26 DIAGNOSIS — I110 Hypertensive heart disease with heart failure: Secondary | ICD-10-CM | POA: Diagnosis present

## 2023-04-26 DIAGNOSIS — K909 Intestinal malabsorption, unspecified: Secondary | ICD-10-CM | POA: Diagnosis not present

## 2023-04-26 DIAGNOSIS — I700 Atherosclerosis of aorta: Secondary | ICD-10-CM | POA: Diagnosis not present

## 2023-04-26 DIAGNOSIS — Z923 Personal history of irradiation: Secondary | ICD-10-CM

## 2023-04-26 DIAGNOSIS — Z9181 History of falling: Secondary | ICD-10-CM

## 2023-04-26 DIAGNOSIS — E785 Hyperlipidemia, unspecified: Secondary | ICD-10-CM | POA: Diagnosis present

## 2023-04-26 DIAGNOSIS — K529 Noninfective gastroenteritis and colitis, unspecified: Principal | ICD-10-CM | POA: Diagnosis present

## 2023-04-26 DIAGNOSIS — E871 Hypo-osmolality and hyponatremia: Secondary | ICD-10-CM

## 2023-04-26 DIAGNOSIS — U071 COVID-19: Secondary | ICD-10-CM | POA: Diagnosis not present

## 2023-04-26 DIAGNOSIS — A0471 Enterocolitis due to Clostridium difficile, recurrent: Secondary | ICD-10-CM | POA: Diagnosis not present

## 2023-04-26 DIAGNOSIS — A0472 Enterocolitis due to Clostridium difficile, not specified as recurrent: Secondary | ICD-10-CM | POA: Diagnosis not present

## 2023-04-26 DIAGNOSIS — U099 Post covid-19 condition, unspecified: Secondary | ICD-10-CM | POA: Diagnosis present

## 2023-04-26 DIAGNOSIS — Z79890 Hormone replacement therapy (postmenopausal): Secondary | ICD-10-CM

## 2023-04-26 DIAGNOSIS — Z1152 Encounter for screening for COVID-19: Secondary | ICD-10-CM | POA: Diagnosis not present

## 2023-04-26 DIAGNOSIS — W06XXXA Fall from bed, initial encounter: Secondary | ICD-10-CM | POA: Diagnosis present

## 2023-04-26 DIAGNOSIS — A414 Sepsis due to anaerobes: Secondary | ICD-10-CM | POA: Diagnosis not present

## 2023-04-26 DIAGNOSIS — I169 Hypertensive crisis, unspecified: Secondary | ICD-10-CM

## 2023-04-26 DIAGNOSIS — I10 Essential (primary) hypertension: Secondary | ICD-10-CM | POA: Diagnosis present

## 2023-04-26 DIAGNOSIS — I5032 Chronic diastolic (congestive) heart failure: Secondary | ICD-10-CM | POA: Diagnosis not present

## 2023-04-26 DIAGNOSIS — M810 Age-related osteoporosis without current pathological fracture: Secondary | ICD-10-CM | POA: Diagnosis present

## 2023-04-26 DIAGNOSIS — K219 Gastro-esophageal reflux disease without esophagitis: Secondary | ICD-10-CM | POA: Diagnosis not present

## 2023-04-26 DIAGNOSIS — M353 Polymyalgia rheumatica: Secondary | ICD-10-CM | POA: Diagnosis present

## 2023-04-26 DIAGNOSIS — A09 Infectious gastroenteritis and colitis, unspecified: Secondary | ICD-10-CM

## 2023-04-26 DIAGNOSIS — I2510 Atherosclerotic heart disease of native coronary artery without angina pectoris: Secondary | ICD-10-CM | POA: Diagnosis not present

## 2023-04-26 DIAGNOSIS — K862 Cyst of pancreas: Secondary | ICD-10-CM | POA: Diagnosis not present

## 2023-04-26 DIAGNOSIS — W19XXXA Unspecified fall, initial encounter: Secondary | ICD-10-CM | POA: Diagnosis not present

## 2023-04-26 DIAGNOSIS — R000 Tachycardia, unspecified: Secondary | ICD-10-CM | POA: Diagnosis not present

## 2023-04-26 DIAGNOSIS — R0600 Dyspnea, unspecified: Secondary | ICD-10-CM | POA: Diagnosis present

## 2023-04-26 DIAGNOSIS — K5190 Ulcerative colitis, unspecified, without complications: Secondary | ICD-10-CM | POA: Diagnosis present

## 2023-04-26 DIAGNOSIS — E063 Autoimmune thyroiditis: Secondary | ICD-10-CM | POA: Diagnosis present

## 2023-04-26 DIAGNOSIS — E876 Hypokalemia: Secondary | ICD-10-CM | POA: Diagnosis present

## 2023-04-26 DIAGNOSIS — K8689 Other specified diseases of pancreas: Secondary | ICD-10-CM | POA: Diagnosis present

## 2023-04-26 DIAGNOSIS — Z90710 Acquired absence of both cervix and uterus: Secondary | ICD-10-CM

## 2023-04-26 DIAGNOSIS — R627 Adult failure to thrive: Secondary | ICD-10-CM | POA: Diagnosis present

## 2023-04-26 DIAGNOSIS — I2699 Other pulmonary embolism without acute cor pulmonale: Secondary | ICD-10-CM | POA: Diagnosis not present

## 2023-04-26 DIAGNOSIS — R54 Age-related physical debility: Secondary | ICD-10-CM | POA: Diagnosis present

## 2023-04-26 DIAGNOSIS — D849 Immunodeficiency, unspecified: Secondary | ICD-10-CM | POA: Diagnosis present

## 2023-04-26 DIAGNOSIS — R6510 Systemic inflammatory response syndrome (SIRS) of non-infectious origin without acute organ dysfunction: Secondary | ICD-10-CM

## 2023-04-26 DIAGNOSIS — F5101 Primary insomnia: Secondary | ICD-10-CM

## 2023-04-26 DIAGNOSIS — R0609 Other forms of dyspnea: Secondary | ICD-10-CM | POA: Diagnosis present

## 2023-04-26 DIAGNOSIS — E039 Hypothyroidism, unspecified: Secondary | ICD-10-CM | POA: Diagnosis present

## 2023-04-26 DIAGNOSIS — J45909 Unspecified asthma, uncomplicated: Secondary | ICD-10-CM | POA: Diagnosis present

## 2023-04-26 DIAGNOSIS — Z9012 Acquired absence of left breast and nipple: Secondary | ICD-10-CM

## 2023-04-26 DIAGNOSIS — I2782 Chronic pulmonary embolism: Secondary | ICD-10-CM | POA: Diagnosis present

## 2023-04-26 DIAGNOSIS — R0902 Hypoxemia: Secondary | ICD-10-CM | POA: Diagnosis not present

## 2023-04-26 DIAGNOSIS — I959 Hypotension, unspecified: Secondary | ICD-10-CM | POA: Diagnosis not present

## 2023-04-26 DIAGNOSIS — I2602 Saddle embolus of pulmonary artery with acute cor pulmonale: Secondary | ICD-10-CM | POA: Diagnosis not present

## 2023-04-26 DIAGNOSIS — Z885 Allergy status to narcotic agent status: Secondary | ICD-10-CM

## 2023-04-26 DIAGNOSIS — J90 Pleural effusion, not elsewhere classified: Secondary | ICD-10-CM | POA: Diagnosis not present

## 2023-04-26 DIAGNOSIS — G4700 Insomnia, unspecified: Secondary | ICD-10-CM | POA: Diagnosis present

## 2023-04-26 DIAGNOSIS — R197 Diarrhea, unspecified: Secondary | ICD-10-CM | POA: Insufficient documentation

## 2023-04-26 DIAGNOSIS — Z888 Allergy status to other drugs, medicaments and biological substances status: Secondary | ICD-10-CM

## 2023-04-26 DIAGNOSIS — E861 Hypovolemia: Secondary | ICD-10-CM | POA: Diagnosis present

## 2023-04-26 DIAGNOSIS — I771 Stricture of artery: Secondary | ICD-10-CM | POA: Diagnosis not present

## 2023-04-26 DIAGNOSIS — J9611 Chronic respiratory failure with hypoxia: Secondary | ICD-10-CM | POA: Diagnosis not present

## 2023-04-26 DIAGNOSIS — R109 Unspecified abdominal pain: Secondary | ICD-10-CM | POA: Diagnosis not present

## 2023-04-26 DIAGNOSIS — R933 Abnormal findings on diagnostic imaging of other parts of digestive tract: Secondary | ICD-10-CM | POA: Diagnosis not present

## 2023-04-26 DIAGNOSIS — Z9981 Dependence on supplemental oxygen: Secondary | ICD-10-CM

## 2023-04-26 DIAGNOSIS — Z7951 Long term (current) use of inhaled steroids: Secondary | ICD-10-CM

## 2023-04-26 DIAGNOSIS — Z79899 Other long term (current) drug therapy: Secondary | ICD-10-CM

## 2023-04-26 DIAGNOSIS — Z7952 Long term (current) use of systemic steroids: Secondary | ICD-10-CM

## 2023-04-26 DIAGNOSIS — I5030 Unspecified diastolic (congestive) heart failure: Secondary | ICD-10-CM | POA: Diagnosis not present

## 2023-04-26 DIAGNOSIS — R55 Syncope and collapse: Secondary | ICD-10-CM | POA: Diagnosis not present

## 2023-04-26 DIAGNOSIS — Z9221 Personal history of antineoplastic chemotherapy: Secondary | ICD-10-CM

## 2023-04-26 DIAGNOSIS — R61 Generalized hyperhidrosis: Secondary | ICD-10-CM | POA: Diagnosis not present

## 2023-04-26 DIAGNOSIS — Z853 Personal history of malignant neoplasm of breast: Secondary | ICD-10-CM

## 2023-04-26 DIAGNOSIS — F411 Generalized anxiety disorder: Secondary | ICD-10-CM | POA: Insufficient documentation

## 2023-04-26 DIAGNOSIS — Z7983 Long term (current) use of bisphosphonates: Secondary | ICD-10-CM

## 2023-04-26 LAB — URINALYSIS, ROUTINE W REFLEX MICROSCOPIC
Bilirubin Urine: NEGATIVE
Glucose, UA: NEGATIVE mg/dL
Hgb urine dipstick: NEGATIVE
Ketones, ur: NEGATIVE mg/dL
Leukocytes,Ua: NEGATIVE
Nitrite: NEGATIVE
Protein, ur: NEGATIVE mg/dL
Specific Gravity, Urine: 1.01 (ref 1.005–1.030)
pH: 5 (ref 5.0–8.0)

## 2023-04-26 LAB — COMPREHENSIVE METABOLIC PANEL
ALT: 28 U/L (ref 0–44)
AST: 16 U/L (ref 15–41)
Albumin: 3.1 g/dL — ABNORMAL LOW (ref 3.5–5.0)
Alkaline Phosphatase: 41 U/L (ref 38–126)
Anion gap: 9 (ref 5–15)
BUN: 18 mg/dL (ref 8–23)
CO2: 22 mmol/L (ref 22–32)
Calcium: 8.3 mg/dL — ABNORMAL LOW (ref 8.9–10.3)
Chloride: 103 mmol/L (ref 98–111)
Creatinine, Ser: 0.91 mg/dL (ref 0.44–1.00)
GFR, Estimated: >60 mL/min (ref >60)
Glucose, Bld: 159 mg/dL — ABNORMAL HIGH (ref 70–99)
Potassium: 3.2 mmol/L — ABNORMAL LOW (ref 3.5–5.1)
Sodium: 134 mmol/L — ABNORMAL LOW (ref 135–145)
Total Bilirubin: 0.7 mg/dL (ref 0.3–1.2)
Total Protein: 5.8 g/dL — ABNORMAL LOW (ref 6.5–8.1)

## 2023-04-26 LAB — MAGNESIUM: Magnesium: 1.6 mg/dL — ABNORMAL LOW (ref 1.7–2.4)

## 2023-04-26 LAB — CBC
HCT: 40.9 % (ref 36.0–46.0)
Hemoglobin: 13.6 g/dL (ref 12.0–15.0)
MCH: 31.9 pg (ref 26.0–34.0)
MCHC: 33.3 g/dL (ref 30.0–36.0)
MCV: 96 fL (ref 80.0–100.0)
Platelets: 185 10*3/uL (ref 150–400)
RBC: 4.26 MIL/uL (ref 3.87–5.11)
RDW: 13.7 % (ref 11.5–15.5)
WBC: 15.8 10*3/uL — ABNORMAL HIGH (ref 4.0–10.5)
nRBC: 0 % (ref 0.0–0.2)

## 2023-04-26 LAB — RESP PANEL BY RT-PCR (RSV, FLU A&B, COVID)  RVPGX2
Influenza A by PCR: NEGATIVE
Influenza B by PCR: NEGATIVE
Resp Syncytial Virus by PCR: NEGATIVE
SARS Coronavirus 2 by RT PCR: NEGATIVE

## 2023-04-26 LAB — TROPONIN I (HIGH SENSITIVITY)
Troponin I (High Sensitivity): 16 ng/L (ref <18)
Troponin I (High Sensitivity): 16 ng/L (ref <18)

## 2023-04-26 LAB — I-STAT CG4 LACTIC ACID, ED: Lactic Acid, Venous: 1.9 mmol/L (ref 0.5–1.9)

## 2023-04-26 LAB — CK: Total CK: 58 U/L (ref 38–234)

## 2023-04-26 LAB — D-DIMER, QUANTITATIVE: D-Dimer, Quant: 3.76 ug/mL-FEU — ABNORMAL HIGH (ref 0.00–0.50)

## 2023-04-26 MED ORDER — ISOSORBIDE MONONITRATE ER 30 MG PO TB24
30.0000 mg | ORAL_TABLET | Freq: Every day | ORAL | Status: DC
  Administered 2023-04-27 – 2023-05-01 (×5): 30 mg via ORAL
  Filled 2023-04-26 (×6): qty 1

## 2023-04-26 MED ORDER — FIDAXOMICIN 200 MG PO TABS
200.0000 mg | ORAL_TABLET | Freq: Two times a day (BID) | ORAL | Status: DC
  Filled 2023-04-26: qty 1

## 2023-04-26 MED ORDER — BUDESONIDE 0.5 MG/2ML IN SUSP: 0.5000 mg | Freq: Two times a day (BID) | RESPIRATORY_TRACT | Status: DC | PRN

## 2023-04-26 MED ORDER — ONDANSETRON HCL 4 MG PO TABS: 4.0000 mg | ORAL_TABLET | Freq: Four times a day (QID) | ORAL | Status: DC | PRN

## 2023-04-26 MED ORDER — SODIUM CHLORIDE 0.9% FLUSH: 3.0000 mL | INTRAVENOUS | Status: DC | PRN

## 2023-04-26 MED ORDER — ACETAMINOPHEN 325 MG PO TABS
650.0000 mg | ORAL_TABLET | Freq: Four times a day (QID) | ORAL | Status: DC | PRN
  Administered 2023-04-28 – 2023-04-30 (×2): 650 mg via ORAL
  Filled 2023-04-26 (×2): qty 2

## 2023-04-26 MED ORDER — LACTATED RINGERS IV SOLN: INTRAVENOUS | Status: AC

## 2023-04-26 MED ORDER — HYDRALAZINE HCL 20 MG/ML IJ SOLN: 10.0000 mg | Freq: Three times a day (TID) | INTRAMUSCULAR | Status: DC | PRN

## 2023-04-26 MED ORDER — SODIUM CHLORIDE 0.9% FLUSH
3.0000 mL | Freq: Two times a day (BID) | INTRAVENOUS | Status: DC
  Administered 2023-04-26 – 2023-04-30 (×9): 3 mL via INTRAVENOUS

## 2023-04-26 MED ORDER — PRAVASTATIN SODIUM 20 MG PO TABS
20.0000 mg | ORAL_TABLET | Freq: Every day | ORAL | Status: DC
  Administered 2023-04-27 – 2023-05-01 (×5): 20 mg via ORAL
  Filled 2023-04-26 (×5): qty 1

## 2023-04-26 MED ORDER — NITROGLYCERIN 0.4 MG SL SUBL: 0.4000 mg | SUBLINGUAL_TABLET | SUBLINGUAL | Status: DC | PRN

## 2023-04-26 MED ORDER — TRAZODONE HCL 50 MG PO TABS
150.0000 mg | ORAL_TABLET | Freq: Every day | ORAL | Status: DC
  Administered 2023-04-26 – 2023-04-30 (×5): 150 mg via ORAL
  Filled 2023-04-26 (×2): qty 1
  Filled 2023-04-26: qty 2
  Filled 2023-04-26 (×2): qty 1

## 2023-04-26 MED ORDER — HEPARIN BOLUS VIA INFUSION
1500.0000 [IU] | Freq: Once | INTRAVENOUS | Status: AC
  Administered 2023-04-26: 1500 [IU] via INTRAVENOUS
  Filled 2023-04-26: qty 1500

## 2023-04-26 MED ORDER — ALBUTEROL SULFATE (2.5 MG/3ML) 0.083% IN NEBU: 3.0000 mL | INHALATION_SOLUTION | Freq: Four times a day (QID) | RESPIRATORY_TRACT | Status: DC | PRN

## 2023-04-26 MED ORDER — AMLODIPINE BESYLATE 10 MG PO TABS
5.0000 mg | ORAL_TABLET | Freq: Two times a day (BID) | ORAL | Status: DC
  Administered 2023-04-26 – 2023-05-01 (×10): 5 mg via ORAL
  Filled 2023-04-26 (×10): qty 1

## 2023-04-26 MED ORDER — PANTOPRAZOLE SODIUM 40 MG PO TBEC
40.0000 mg | DELAYED_RELEASE_TABLET | Freq: Two times a day (BID) | ORAL | Status: DC
  Administered 2023-04-27 – 2023-05-01 (×9): 40 mg via ORAL
  Filled 2023-04-26 (×8): qty 1

## 2023-04-26 MED ORDER — ACETAMINOPHEN 650 MG RE SUPP: 650.0000 mg | Freq: Four times a day (QID) | RECTAL | Status: DC | PRN

## 2023-04-26 MED ORDER — LOSARTAN POTASSIUM 50 MG PO TABS
50.0000 mg | ORAL_TABLET | Freq: Every day | ORAL | Status: DC
  Administered 2023-04-27 – 2023-05-01 (×5): 50 mg via ORAL
  Filled 2023-04-26 (×3): qty 1
  Filled 2023-04-26: qty 2
  Filled 2023-04-26: qty 1

## 2023-04-26 MED ORDER — HEPARIN (PORCINE) 25000 UT/250ML-% IV SOLN
1000.0000 [IU]/h | INTRAVENOUS | Status: DC
  Administered 2023-04-26: 850 [IU]/h via INTRAVENOUS
  Administered 2023-04-27: 1200 [IU]/h via INTRAVENOUS
  Filled 2023-04-26 (×2): qty 250

## 2023-04-26 MED ORDER — FUROSEMIDE 40 MG PO TABS: 40.0000 mg | ORAL_TABLET | Freq: Every day | ORAL | Status: DC

## 2023-04-26 MED ORDER — ONDANSETRON HCL 4 MG/2ML IJ SOLN: 4.0000 mg | Freq: Four times a day (QID) | INTRAMUSCULAR | Status: DC | PRN

## 2023-04-26 MED ORDER — SODIUM CHLORIDE 0.9 % IV BOLUS
1000.0000 mL | Freq: Once | INTRAVENOUS | Status: AC
  Administered 2023-04-26: 1000 mL via INTRAVENOUS

## 2023-04-26 MED ORDER — CARVEDILOL 6.25 MG PO TABS
6.2500 mg | ORAL_TABLET | Freq: Two times a day (BID) | ORAL | Status: DC
  Administered 2023-04-26 – 2023-05-01 (×10): 6.25 mg via ORAL
  Filled 2023-04-26 (×2): qty 2
  Filled 2023-04-26 (×8): qty 1

## 2023-04-26 MED ORDER — POTASSIUM CHLORIDE CRYS ER 20 MEQ PO TBCR
40.0000 meq | EXTENDED_RELEASE_TABLET | Freq: Once | ORAL | Status: AC
  Administered 2023-04-26: 40 meq via ORAL
  Filled 2023-04-26: qty 2

## 2023-04-26 MED ORDER — MAGNESIUM OXIDE -MG SUPPLEMENT 400 (240 MG) MG PO TABS
400.0000 mg | ORAL_TABLET | Freq: Once | ORAL | Status: AC
  Administered 2023-04-26: 400 mg via ORAL
  Filled 2023-04-26: qty 1

## 2023-04-26 MED ORDER — LEVOTHYROXINE SODIUM 88 MCG PO TABS
88.0000 ug | ORAL_TABLET | Freq: Every day | ORAL | Status: DC
  Administered 2023-04-27 – 2023-05-01 (×5): 88 ug via ORAL
  Filled 2023-04-26 (×5): qty 1

## 2023-04-26 MED ORDER — ISOSORBIDE MONONITRATE ER 30 MG PO TB24: 30.0000 mg | ORAL_TABLET | Freq: Every day | ORAL | Status: DC

## 2023-04-26 MED ORDER — CHOLESTYRAMINE LIGHT 4 G PO PACK: 4.0000 g | PACK | Freq: Every day | ORAL | Status: DC

## 2023-04-26 MED ORDER — IOHEXOL 350 MG/ML SOLN
100.0000 mL | Freq: Once | INTRAVENOUS | Status: AC | PRN
  Administered 2023-04-26: 80 mL via INTRAVENOUS

## 2023-04-26 MED ORDER — FIDAXOMICIN 200 MG PO TABS
200.0000 mg | ORAL_TABLET | Freq: Two times a day (BID) | ORAL | Status: DC
  Administered 2023-04-26 – 2023-04-28 (×4): 200 mg via ORAL
  Filled 2023-04-26 (×4): qty 1

## 2023-04-26 MED ORDER — SODIUM CHLORIDE 0.9 % IV SOLN: 250.0000 mL | INTRAVENOUS | Status: DC | PRN

## 2023-04-26 NOTE — H&P (Incomplete)
History and Physical    Jodi Valdez:[id number] DOB: November 09, 1935 DOA: 04/26/2023  PCP: Ailene Ravel, MD   Patient coming from: Home   Chief Complaint:  Chief Complaint  Patient presents with   Diarrhea    HPI:  Jodi Valdez is a 87 y.o. female with medical history significant of breast cancer status post completion of chemo/radiation/mastectomy 2022, essential hypertension, hyperlipidemia, grade 1 on diastolic heart failure with preserved EF 60 to 65%, hypothyroidism, reactive airway disease on as needed oxygen and Pulmicort nebulizer, generalized anxiety disorder and polymyalgia presented to emergency department after 2 episodes of volume is diarrhea and generalized weakness associated with near fall.  Patient denies any  Per chart review patient has recent hospital admission on 6/5 to 6/7 for acute colitis in the setting of recurrent C. difficile infection.  CT abdomen showed pancolitis.  Patient was evaluated with ID and GI recommended oral vancomycin taper course for 6 weeks.  She remained stable and tolerate oral diet and discharged home on 6/7.   ED Course:  At presentation to ED patient is tachycardic 123, blood pressure elevated 17 5/70, respiratory rate 18 and O2 sat 96% room air.  CBC showed elevated WBC count 15.8, RBC 4.26, hemoglobin 13.6, hematocrit 40.9 and platelet 195 Pressure slightly low sodium 134, low potassium 3.2, chloride 103, bicarb 22, blood glucose 159, BUN 18, creatinine 0.91, calcium 0.83, low albumin 3.1, GFR above 60 and anion gap 9.  Low mag 1.6. Lactic acid 1.9 within normal range. Pending GI and C. difficile panel. Respiratory panel negative.  Troponin x 2 within normal range.  ED showed sinus tachycardia heart rate 112.  No ST and T wave abnormality.  Due to persistent tachycardia check D-dimer which is elevated 3.76.  Imaging CTA and CT abdomen pelvis with contrast finding following: IMPRESSION: 1. No definite evidence  of acute pulmonary embolism. Multiple thin webs within the right lower lobar pulmonary artery in keeping with the sequela of chronic pulmonary embolus. 2. Moderate multi-vessel coronary artery calcification. 3. Trace bilateral pleural effusions. 4. Interval development of a remote appearing superior endplate fracture of T6 with approximately 50% loss of height. 5. Stable cystic lesion within the uncinate process of the pancreas measuring up to 3.7 cm in greatest dimension and, as noted on prior examination, demonstrates slow interval growth since remote prior examination of 12/09/2017 where this measured 11 mm in greatest dimension. Given its slow interval progression, a benign etiology such as a side branch IPMN is considered most likely. 6. Extensive aortoiliac atherosclerotic calcification with probable hemodynamically significant stenoses of the celiac axis, proximal superior and inferior mesenteric arteries, and renal arteries bilaterally. Calcified plaque within the infrarenal abdominal aorta may result in hemodynamically significant stenosis in this location as well. Clinical correlation for signs and symptoms of chronic mesenteric ischemia or hemodynamically significant renal artery stenosis may be helpful. 7. Infectious or inflammatory proctocolitis as can be seen with ulcerative colitis or pseudomembranous colitis. No evidence of obstruction or perforation. 8.  Aortic Atherosclerosis   In the ED patient has been resuscitated with 2 L of normal saline bolus given that patient remained tachycardic 123.  Blood pressure persistently found elevated to 175/70.  Review of Systems:  ROS  Past Medical History:  Diagnosis Date   Allergy    Anemia    Asthma    Breast cancer (HCC)    Cancer (HCC) 11/01/2000   left breast cancer   GERD (gastroesophageal reflux disease)    Heart  disease    Hyperlipidemia    Hypertension    Long COVID    Personal history of chemotherapy     2002   Personal history of radiation therapy    2002   Polymyalgia [facility])     Past Surgical History:  Procedure Laterality Date   ABDOMINAL HYSTERECTOMY  1975   BREAST SURGERY Left 11/01/2000   masectomy   COLONOSCOPY  2015   CYSTOCELE REPAIR  09/1999   MASTECTOMY Left    2002 with tramflap   RECTOCELE REPAIR  09/1999     reports that she has never smoked. She has never used smokeless tobacco. She reports that she does not drink alcohol and does not use drugs.  Allergies  Allergen Reactions   Propranolol Other (See Comments)    Hallucinations    Gabapentin Other (See Comments)    Hallucinations   Metformin And Related Diarrhea   Codeine Nausea And Vomiting   Nitrofuran Derivatives Nausea And Vomiting and Other (See Comments)    GI Intolerance    Family History  Problem Relation Age of Onset   Colon cancer Neg Hx    Colon polyps Neg Hx    Diabetes Neg Hx    Kidney disease Neg Hx    Esophageal cancer Neg Hx     Prior to Admission medications   Medication Sig Start Date End Date Taking? Authorizing Provider  Acetylcysteine (NAC PO) Take 1 capsule by mouth daily.    [provider]  albuterol (VENTOLIN HFA) 108 (90 Base) MCG/ACT inhaler Inhale 2 puffs into the lungs every 6 (six) hours as needed for wheezing or shortness of breath. 08/20/21   Luciano Cutter, MD  alendronate (FOSAMAX) 70 MG tablet Take 70 mg by mouth every Monday. 12/12/13   [provider]  amLODipine (NORVASC) 5 MG tablet Take 5 mg by mouth 2 (two) times daily. 10/20/22   [provider]  ascorbic acid (VITAMIN C) 1000 MG tablet Take 1,000 mg by mouth daily. 06/17/20   [provider]  budesonide (PULMICORT) 0.5 MG/2ML nebulizer solution Use one vial in nebulizer twice daily as directed. Patient taking differently: Take 0.5 mg by nebulization 2 (two) times daily as needed (for flares). 12/07/22   Kozlow, Alvira Philips, MD  carvedilol (COREG) 6.25 MG tablet Take 6.25 mg by mouth  2 (two) times daily with a meal.    [provider]  Cholecalciferol (VITAMIN D-3) 125 MCG (5000 UT) TABS Take 5,000 Units by mouth 2 (two) times daily.    [provider]  cholestyramine (QUESTRAN) 4 g packet Take 1 packet (4 g total) by mouth daily. 04/22/23   Lynann Bologna, MD  EPINEPHrine 0.3 mg/0.3 mL IJ SOAJ injection Use as directed for life-threatening allergic reaction. Patient taking differently: Inject 0.3 mg into the muscle once as needed (as directed for a life-threatening allergic reaction). 12/13/17   Kozlow, Alvira Philips, MD  furosemide (LASIX) 40 MG tablet TAKE 1 TABLET BY MOUTH EVERY DAY 04/09/23   Georgeanna Lea, MD  isosorbide mononitrate (IMDUR) 30 MG 24 hr tablet Take 1 tablet (30 mg total) by mouth daily. 11/09/22   Georgeanna Lea, MD  levothyroxine (SYNTHROID) 88 MCG tablet Take 1 tablet (88 mcg total) by mouth daily. Patient taking differently: Take 88 mcg by mouth daily before breakfast. 10/30/22   Shamleffer, Konrad Dolores, MD  losartan (COZAAR) 50 MG tablet Take 50 mg by mouth daily.    [provider]  Multiple Vitamins-Minerals (ONE  DAILY MULTIVIT-MIN ADULT PO) Take 1 tablet by mouth daily.    [provider]  Multiple Vitamins-Minerals (PRESERVISION/LUTEIN PO) Take 1 capsule by mouth 2 (two) times daily. Unknown strength    [provider]  nitroGLYCERIN (NITROSTAT) 0.4 MG SL tablet Place 1 tablet (0.4 mg total) under the tongue every 5 (five) minutes as needed for chest pain. MAX 3 DOSES IN 15 MINUTES. IF STILL PAIN CALL 911 Patient taking differently: Place 0.4 mg under the tongue every 5 (five) minutes x 3 doses as needed for chest pain (CALL 9-1-1, IF NO RELIEF). 10/07/22   Georgeanna Lea, MD  OXYGEN Inhale 5 L/min into the lungs as needed (for shortness of breath).    [provider]  pantoprazole (PROTONIX) 40 MG tablet Take one tablet by mouth twice daily as directed. Patient taking differently: Take 40 mg  by mouth 2 (two) times daily before a meal. 04/15/22   Kozlow, Alvira Philips, MD  potassium chloride SA (KLOR-CON M) 20 MEQ tablet Take 1 tablet (20 mEq total) by mouth daily. Patient taking differently: Take 20 mEq by mouth See admin instructions. Take 20 mEq by mouth once a day only when taking Furosemide/Lasix 06/11/22   Georgeanna Lea, MD  pravastatin (PRAVACHOL) 20 MG tablet Take 20 mg by mouth daily. 04/03/21   [provider]  predniSONE (DELTASONE) 1 MG tablet Take 3 mg by mouth daily. 01/12/19   [provider]  predniSONE (DELTASONE) 5 MG tablet Take 5 mg by mouth daily. 03/16/22   [provider]  Spacer/Aero-Holding Rudean Curt As directed Patient taking differently: 1 each by Other route See admin instructions. As directed 04/08/21   Luciano Cutter, MD  traZODone (DESYREL) 100 MG tablet Take 150 mg by mouth at bedtime. 05/18/20   [provider]     Physical Exam: Vitals:   04/26/23 2200 04/26/23 2215 04/26/23 2230 04/26/23 2315  BP: (!) 172/58 (!) 184/69 (!) 175/70   Pulse: (!) 115 (!) 115 (!) 123   Resp: (!) 23 (!) 23 19   Temp:    98.2 F (36.8 C)  TempSrc:    Oral  SpO2: 95% 95% 96%   Weight:      Height:        Physical Exam   Labs on Admission: I have personally reviewed following labs and imaging studies  CBC: Recent Labs  Lab 04/26/23 1604  WBC 15.8*  HGB 13.6  HCT 40.9  MCV 96.0  PLT 185   Basic Metabolic Panel: Recent Labs  Lab 04/26/23 1604  NA 134*  K 3.2*  CL 103  CO2 22  GLUCOSE 159*  BUN 18  CREATININE 0.91  CALCIUM 8.3*  MG 1.6*   GFR: Estimated Creatinine Clearance: 39.5 mL/min (by C-G formula based on SCr of 0.91 mg/dL). Liver Function Tests: Recent Labs  Lab 04/26/23 1604  AST 16  ALT 28  ALKPHOS 41  BILITOT 0.7  PROT 5.8*  ALBUMIN 3.1*   No results for input(s): "LIPASE", "AMYLASE" in the last 168 hours. No results for input(s): "AMMONIA" in the last 168 hours. Coagulation  Profile: No results for input(s): "INR", "PROTIME" in the last 168 hours. Cardiac Enzymes: Recent Labs  Lab 04/26/23 1604 04/26/23 1835  CKTOTAL 58  --   TROPONINIHS 16 16   BNP (last 3 results) No results for input(s): "BNP" in the last 8760 hours. HbA1C: No results for input(s): "HGBA1C" in the last 72 hours. CBG: No results for  input(s): "GLUCAP" in the last 168 hours. Lipid Profile: No results for input(s): "CHOL", "HDL", "LDLCALC", "TRIG", "CHOLHDL", "LDLDIRECT" in the last 72 hours. Thyroid Function Tests: No results for input(s): "TSH", "T4TOTAL", "FREET4", "T3FREE", "THYROIDAB" in the last 72 hours. Anemia Panel: No results for input(s): "VITAMINB12", "FOLATE", "FERRITIN", "TIBC", "IRON", "RETICCTPCT" in the last 72 hours. Urine analysis:    Component Value Date/Time   COLORURINE YELLOW 04/26/2023 1734   APPEARANCEUR CLEAR 04/26/2023 1734   LABSPEC 1.010 04/26/2023 1734   LABSPEC 1.005 07/08/2007 1555   PHURINE 5.0 04/26/2023 1734   GLUCOSEU NEGATIVE 04/26/2023 1734   GLUCOSEU NEGATIVE 07/16/2014 1120   HGBUR NEGATIVE 04/26/2023 1734   BILIRUBINUR NEGATIVE 04/26/2023 1734   BILIRUBINUR Negative 07/08/2007 1555   KETONESUR NEGATIVE 04/26/2023 1734   PROTEINUR NEGATIVE 04/26/2023 1734   UROBILINOGEN 0.2 09/26/2014 1130   NITRITE NEGATIVE 04/26/2023 1734   LEUKOCYTESUR NEGATIVE 04/26/2023 1734   LEUKOCYTESUR Negative 07/08/2007 1555    Radiological Exams on Admission: I have personally reviewed images CT Angio Chest PE W and/or Wo Contrast  Result Date: 04/26/2023 CLINICAL DATA:  Pulmonary embolism (PE) suspected, high prob; Abdominal pain, acute, nonlocalized EXAM: CT ANGIOGRAPHY CHEST CT ABDOMEN AND PELVIS WITH CONTRAST TECHNIQUE: Multidetector CT imaging of the chest was performed using the standard protocol during bolus administration of intravenous contrast. Multiplanar CT image reconstructions and MIPs were obtained to evaluate the vascular anatomy.  Multidetector CT imaging of the abdomen and pelvis was performed using the standard protocol during bolus administration of intravenous contrast. RADIATION DOSE REDUCTION: This exam was performed according to the departmental dose-optimization program which includes automated exposure control, adjustment of the mA and/or kV according to patient size and/or use of iterative reconstruction technique. CONTRAST:  80mL OMNIPAQUE IOHEXOL 350 MG/ML SOLN COMPARISON:  CT chest 07/18/2021, CT abdomen pelvis 02/24/2023 FINDINGS: CTA CHEST FINDINGS Cardiovascular: There is adequate opacification of the pulmonary arterial tree. There are multiple thin web seen within the right lower lobar pulmonary artery best seen on image # 68/4 in keeping with the sequela of chronic pulmonary embolus. However, no intraluminal filling defect is identified to suggest acute pulmonary embolism. The central pulmonary arteries are of normal caliber. Moderate multi-vessel coronary artery calcification. Global cardiac size is within normal limits. No pericardial effusion. Extensive atherosclerotic calcification within the thoracic aorta. No aortic aneurysm. Mediastinum/Nodes: No enlarged mediastinal, hilar, or axillary lymph nodes. Thyroid gland, trachea, and esophagus demonstrate no significant findings. Lungs/Pleura: Lungs are clear. No pneumothorax. Trace bilateral pleural effusions. No central obstructing lesion. Musculoskeletal: Surgical changes of left mastectomy and axillary lymph node dissection are identified. Stable superior endplate fracture of T3. Interval development of a remote appearing superior endplate fracture of T6 with approximately 50% loss of height. No acute bone abnormality. No lytic or blastic bone lesion. Review of the MIP images confirms the above findings. CT ABDOMEN and PELVIS FINDINGS Hepatobiliary: No focal liver abnormality is seen. No gallstones, gallbladder wall thickening, or biliary dilatation. Pancreas: Cystic  lesion within the uncinate process of the pancreas is unchanged measuring up to 3.7 cm in greatest dimension and, as noted on prior examination, demonstrates slow interval growth since remote prior examination of 12/09/2017 where this measured 11 mm in greatest dimension. Given its slow interval progression, a benign etiology such as a side branch IPMN is considered most likely. The pancreas is otherwise unremarkable; the main pancreatic duct is not dilated. No peripancreatic inflammatory changes or fluid collections are seen. Spleen: Unremarkable Adrenals/Urinary Tract: The adrenal glands are unremarkable.  The kidneys are normal. Foley catheter balloon is seen within a decompressed bladder lumen. There is superimposed circumferential bladder wall thickening and mild hyperemia noted, however, suggesting a superimposed infectious or inflammatory cystitis. Mild perivesicular inflammatory changes noted. Stomach/Bowel: There is marked hyperemia and circumferential wall thickening involving the rectosigmoid colon and rectum with mild perirectal inflammatory stranding noted in keeping with an infectious or inflammatory proctocolitis as can be seen with ulcerative colitis or pseudomembranous colitis. There is no evidence of obstruction or perforation. No free intraperitoneal gas. Trace free fluid within the pelvis. The stomach and small bowel are unremarkable. Appendix absent. Vascular/Lymphatic: Extensive aortoiliac atherosclerotic calcification with probable hemodynamically significant stenoses of the celiac axis and proximal superior and inferior mesenteric arteries as well as the renal arteries bilaterally. Calcified plaque within the infrarenal abdominal aorta may result in hemodynamically significant stenosis in this location as well. No aortic aneurysm. No pathologic adenopathy within the abdomen and pelvis. Reproductive: Status post hysterectomy. No adnexal masses. Other: No abdominal wall hernia Musculoskeletal:  No acute bone abnormality. No lytic or blastic bone lesion. Degenerative changes are seen within the lumbar spine. Remote L1 and L2 compression deformities are identified. No acute bone abnormality. Review of the MIP images confirms the above findings. IMPRESSION: 1. No definite evidence of acute pulmonary embolism. Multiple thin webs within the right lower lobar pulmonary artery in keeping with the sequela of chronic pulmonary embolus. 2. Moderate multi-vessel coronary artery calcification. 3. Trace bilateral pleural effusions. 4. Interval development of a remote appearing superior endplate fracture of T6 with approximately 50% loss of height. 5. Stable cystic lesion within the uncinate process of the pancreas measuring up to 3.7 cm in greatest dimension and, as noted on prior examination, demonstrates slow interval growth since remote prior examination of 12/09/2017 where this measured 11 mm in greatest dimension. Given its slow interval progression, a benign etiology such as a side branch IPMN is considered most likely. 6. Extensive aortoiliac atherosclerotic calcification with probable hemodynamically significant stenoses of the celiac axis, proximal superior and inferior mesenteric arteries, and renal arteries bilaterally. Calcified plaque within the infrarenal abdominal aorta may result in hemodynamically significant stenosis in this location as well. Clinical correlation for signs and symptoms of chronic mesenteric ischemia or hemodynamically significant renal artery stenosis may be helpful. 7. Infectious or inflammatory proctocolitis as can be seen with ulcerative colitis or pseudomembranous colitis. No evidence of obstruction or perforation. 8.  Aortic Atherosclerosis (ICD10-I70.0). Electronically Signed   By: Helyn Numbers M.D.   On: 04/26/2023 22:04   CT ABDOMEN PELVIS W CONTRAST  Result Date: 04/26/2023 CLINICAL DATA:  Pulmonary embolism (PE) suspected, high prob; Abdominal pain, acute, nonlocalized  EXAM: CT ANGIOGRAPHY CHEST CT ABDOMEN AND PELVIS WITH CONTRAST TECHNIQUE: Multidetector CT imaging of the chest was performed using the standard protocol during bolus administration of intravenous contrast. Multiplanar CT image reconstructions and MIPs were obtained to evaluate the vascular anatomy. Multidetector CT imaging of the abdomen and pelvis was performed using the standard protocol during bolus administration of intravenous contrast. RADIATION DOSE REDUCTION: This exam was performed according to the departmental dose-optimization program which includes automated exposure control, adjustment of the mA and/or kV according to patient size and/or use of iterative reconstruction technique. CONTRAST:  80mL OMNIPAQUE IOHEXOL 350 MG/ML SOLN COMPARISON:  CT chest 07/18/2021, CT abdomen pelvis 02/24/2023 FINDINGS: CTA CHEST FINDINGS Cardiovascular: There is adequate opacification of the pulmonary arterial tree. There are multiple thin web seen within the right lower lobar pulmonary artery best seen  on image # 68/4 in keeping with the sequela of chronic pulmonary embolus. However, no intraluminal filling defect is identified to suggest acute pulmonary embolism. The central pulmonary arteries are of normal caliber. Moderate multi-vessel coronary artery calcification. Global cardiac size is within normal limits. No pericardial effusion. Extensive atherosclerotic calcification within the thoracic aorta. No aortic aneurysm. Mediastinum/Nodes: No enlarged mediastinal, hilar, or axillary lymph nodes. Thyroid gland, trachea, and esophagus demonstrate no significant findings. Lungs/Pleura: Lungs are clear. No pneumothorax. Trace bilateral pleural effusions. No central obstructing lesion. Musculoskeletal: Surgical changes of left mastectomy and axillary lymph node dissection are identified. Stable superior endplate fracture of T3. Interval development of a remote appearing superior endplate fracture of T6 with approximately  50% loss of height. No acute bone abnormality. No lytic or blastic bone lesion. Review of the MIP images confirms the above findings. CT ABDOMEN and PELVIS FINDINGS Hepatobiliary: No focal liver abnormality is seen. No gallstones, gallbladder wall thickening, or biliary dilatation. Pancreas: Cystic lesion within the uncinate process of the pancreas is unchanged measuring up to 3.7 cm in greatest dimension and, as noted on prior examination, demonstrates slow interval growth since remote prior examination of 12/09/2017 where this measured 11 mm in greatest dimension. Given its slow interval progression, a benign etiology such as a side branch IPMN is considered most likely. The pancreas is otherwise unremarkable; the main pancreatic duct is not dilated. No peripancreatic inflammatory changes or fluid collections are seen. Spleen: Unremarkable Adrenals/Urinary Tract: The adrenal glands are unremarkable. The kidneys are normal. Foley catheter balloon is seen within a decompressed bladder lumen. There is superimposed circumferential bladder wall thickening and mild hyperemia noted, however, suggesting a superimposed infectious or inflammatory cystitis. Mild perivesicular inflammatory changes noted. Stomach/Bowel: There is marked hyperemia and circumferential wall thickening involving the rectosigmoid colon and rectum with mild perirectal inflammatory stranding noted in keeping with an infectious or inflammatory proctocolitis as can be seen with ulcerative colitis or pseudomembranous colitis. There is no evidence of obstruction or perforation. No free intraperitoneal gas. Trace free fluid within the pelvis. The stomach and small bowel are unremarkable. Appendix absent. Vascular/Lymphatic: Extensive aortoiliac atherosclerotic calcification with probable hemodynamically significant stenoses of the celiac axis and proximal superior and inferior mesenteric arteries as well as the renal arteries bilaterally. Calcified plaque  within the infrarenal abdominal aorta may result in hemodynamically significant stenosis in this location as well. No aortic aneurysm. No pathologic adenopathy within the abdomen and pelvis. Reproductive: Status post hysterectomy. No adnexal masses. Other: No abdominal wall hernia Musculoskeletal: No acute bone abnormality. No lytic or blastic bone lesion. Degenerative changes are seen within the lumbar spine. Remote L1 and L2 compression deformities are identified. No acute bone abnormality. Review of the MIP images confirms the above findings. IMPRESSION: 1. No definite evidence of acute pulmonary embolism. Multiple thin webs within the right lower lobar pulmonary artery in keeping with the sequela of chronic pulmonary embolus. 2. Moderate multi-vessel coronary artery calcification. 3. Trace bilateral pleural effusions. 4. Interval development of a remote appearing superior endplate fracture of T6 with approximately 50% loss of height. 5. Stable cystic lesion within the uncinate process of the pancreas measuring up to 3.7 cm in greatest dimension and, as noted on prior examination, demonstrates slow interval growth since remote prior examination of 12/09/2017 where this measured 11 mm in greatest dimension. Given its slow interval progression, a benign etiology such as a side branch IPMN is considered most likely. 6. Extensive aortoiliac atherosclerotic calcification with probable hemodynamically significant stenoses  of the celiac axis, proximal superior and inferior mesenteric arteries, and renal arteries bilaterally. Calcified plaque within the infrarenal abdominal aorta may result in hemodynamically significant stenosis in this location as well. Clinical correlation for signs and symptoms of chronic mesenteric ischemia or hemodynamically significant renal artery stenosis may be helpful. 7. Infectious or inflammatory proctocolitis as can be seen with ulcerative colitis or pseudomembranous colitis. No evidence of  obstruction or perforation. 8.  Aortic Atherosclerosis (ICD10-I70.0). Electronically Signed   By: Helyn Numbers M.D.   On: 04/26/2023 22:04   DG Chest Portable 1 View  Result Date: 04/26/2023 CLINICAL DATA:  Syncope, fall EXAM: PORTABLE CHEST 1 VIEW COMPARISON:  10/22/2022 FINDINGS: Cardiac size is within normal limits. Thoracic aorta is tortuous. Calcifications are seen in thoracic aorta. There are no signs of pulmonary edema or focal pulmonary consolidation. There is no pleural effusion or pneumothorax. Surgical clips are seen in left chest wall. IMPRESSION: No active cardiopulmonary disease. Electronically Signed   By: Ernie Avena M.D.   On: 04/26/2023 17:44    EKG: My personal interpretation of EKG shows: ***    Assessment/Plan: Principal Problem:   Idiopathic proctocolitis (HCC) Active Problems:   SIRS (systemic inflammatory response syndrome) (HCC)   Pancolitis (HCC)   Hyperlipidemia   Essential hypertension   Reactive airway disease   Concern for C. difficile colitis   Hypothyroidism   Hypertensive crisis   Hyponatremia   Hypokalemia   Hypomagnesemia   GAD (generalized anxiety disorder)    Assessment and Plan: No notes have been filed under this hospital service. Service: Hospitalist      DVT prophylaxis:  {Blank single:19197::"Lovenox","SQ Heparin","IV heparin gtts","Xarelto","Eliquis","Coumadin","SCDs","***"} Code Status:  {Blank single:19197::"Full Code","DNR with Intubation","DNR/DNI(Do NOT Intubate)","Comfort Care","***"} Diet:  Family Communication:  ***  Disposition Plan:  ***  Consults:  ***  Admission status:   {Blank single:19197::"Observation","Inpatient"}, {Blank single:19197::"Med-Surg","Telemetry bed","Step Down Unit"}  Severity of Illness: {Observation/Inpatient:21159}    Tereasa Coop, MD [facility]  How to contact the [facility] Attending or Consulting provider 7A - 7P or covering provider during after hours 7P -7A, for this  patient.  Check the care team in [facility] and look for a) attending/consulting [facility] provider listed and b) the [facility] team listed Log into [email or web address] and use [place]'s universal password to access. If you do not have the password, please contact the hospital operator. Locate the [facility] provider you are looking for under [facility] and page to a number that you can be directly reached. If you still have difficulty reaching the provider, please page the [facility] (Director on Call) for the Hospitalists listed on amion for assistance.  04/26/2023, 11:19 PM

## 2023-04-26 NOTE — Progress Notes (Signed)
ANTICOAGULATION CONSULT NOTE - Initial Consult  Pharmacy Consult for heparin Indication: pulmonary embolus  Allergies  Allergen Reactions   Propranolol Other (See Comments)    Hallucinations    Gabapentin Other (See Comments)    Hallucinations   Metformin And Related Diarrhea   Codeine Nausea And Vomiting   Nitrofuran Derivatives Nausea And Vomiting and Other (See Comments)    GI Intolerance    Patient Measurements: Height: 5\' 4"  (162.6 cm) Weight: 54.4 kg (120 lb) IBW/kg (Calculated) : 54.7 Heparin Dosing Weight: 54.4kg  Vital Signs: Temp: 97.8 F (36.6 C) (08/05 1929) Temp Source: Oral (08/05 1929) BP: 175/70 (08/05 2230) Pulse Rate: 123 (08/05 2230)  Labs: Recent Labs    04/26/23 1604 04/26/23 1835  HGB 13.6  --   HCT 40.9  --   PLT 185  --   CREATININE 0.91  --   CKTOTAL 58  --   TROPONINIHS 16 16    Estimated Creatinine Clearance: 39.5 mL/min (by C-G formula based on SCr of 0.91 mg/dL).   Medical History: Past Medical History:  Diagnosis Date   Allergy    Anemia    Asthma    Breast cancer (HCC)    Cancer (HCC) 11/01/2000   left breast cancer   GERD (gastroesophageal reflux disease)    Heart disease    Hyperlipidemia    Hypertension    Long COVID    Personal history of chemotherapy    2002   Personal history of radiation therapy    2002   Polymyalgia [facility])      Assessment:  87 y.o. female with past medical history significant for HTN, GERD, polymyalgia, history of radiation, chemo 2/2 breast cancer, HLD, who presents with concern for generalized weakness throughout the body. CTA chest shows evidence of chronic pulmonary embolism, pharmacy to dose heparin.  No prior AC noted.  HGb 13.6, plts 185, DDimer 3.76   Goal of Therapy:  Heparin level 0.3-0.7 units/ml Monitor platelets by anticoagulation protocol: Yes   Plan:  Using [place] Heparin bolus 1500 units x 1 Start heparin drip at 850 units/hr Heparin level in 8 hours Daily  CBC  Arley Phenix RPh 04/26/2023, 11:04 PM

## 2023-04-26 NOTE — ED Notes (Signed)
Urethral catheter bag emptied at 1,322ml.

## 2023-04-26 NOTE — Telephone Encounter (Signed)
noted 

## 2023-04-26 NOTE — ED Provider Notes (Signed)
[place] EMERGENCY DEPARTMENT AT [facility] Provider Note   CSN: [id number] Arrival date & time: 04/26/23  1518     History  Chief Complaint  Patient presents with   Diarrhea    Jodi Valdez is a 87 y.o. female with past medical history significant for HTN, GERD, polymyalgia, history of radiation, chemo 2/2 breast cancer, HLD, who presents with concern for generalized weakness throughout the body. Patient reports she collapse to the ground yesterday feeling extremely weak, could not make it off the floor all night. Two episodes of "violent diarrhea", once overnight and once in the morning. Recently finished vanc taper and tested negative for Cdiff 6 days ago. Feels like it could be recurrent Cdiff. Patient endorses feeling extremely weak compared to baseline, but denies acute pain.   Diarrhea      Home Medications Prior to Admission medications   Medication Sig Start Date End Date Taking? Authorizing Provider  Acetylcysteine (NAC PO) Take 1 capsule by mouth daily.    [provider]  albuterol (VENTOLIN HFA) 108 (90 Base) MCG/ACT inhaler Inhale 2 puffs into the lungs every 6 (six) hours as needed for wheezing or shortness of breath. 08/20/21   Luciano Cutter, MD  alendronate (FOSAMAX) 70 MG tablet Take 70 mg by mouth every Monday. 12/12/13   [provider]  amLODipine (NORVASC) 5 MG tablet Take 5 mg by mouth 2 (two) times daily. 10/20/22   [provider]  ascorbic acid (VITAMIN C) 1000 MG tablet Take 1,000 mg by mouth daily. 06/17/20   [provider]  budesonide (PULMICORT) 0.5 MG/2ML nebulizer solution Use one vial in nebulizer twice daily as directed. Patient taking differently: Take 0.5 mg by nebulization 2 (two) times daily as needed (for flares). 12/07/22   Kozlow, Alvira Philips, MD  carvedilol (COREG) 6.25 MG tablet Take 6.25 mg by mouth 2 (two) times daily with a meal.    [provider]  Cholecalciferol (VITAMIN  D-3) 125 MCG (5000 UT) TABS Take 5,000 Units by mouth 2 (two) times daily.    [provider]  cholestyramine (QUESTRAN) 4 g packet Take 1 packet (4 g total) by mouth daily. 04/22/23   Lynann Bologna, MD  EPINEPHrine 0.3 mg/0.3 mL IJ SOAJ injection Use as directed for life-threatening allergic reaction. Patient taking differently: Inject 0.3 mg into the muscle once as needed (as directed for a life-threatening allergic reaction). 12/13/17   Kozlow, Alvira Philips, MD  furosemide (LASIX) 40 MG tablet TAKE 1 TABLET BY MOUTH EVERY DAY 04/09/23   Georgeanna Lea, MD  isosorbide mononitrate (IMDUR) 30 MG 24 hr tablet Take 1 tablet (30 mg total) by mouth daily. 11/09/22   Georgeanna Lea, MD  levothyroxine (SYNTHROID) 88 MCG tablet Take 1 tablet (88 mcg total) by mouth daily. Patient taking differently: Take 88 mcg by mouth daily before breakfast. 10/30/22   Shamleffer, Konrad Dolores, MD  losartan (COZAAR) 50 MG tablet Take 50 mg by mouth daily.    [provider]  Multiple Vitamins-Minerals (ONE DAILY MULTIVIT-MIN ADULT PO) Take 1 tablet by mouth daily.    [provider]  Multiple Vitamins-Minerals (PRESERVISION/LUTEIN PO) Take 1 capsule by mouth 2 (two) times daily. Unknown strength    [provider]  nitroGLYCERIN (NITROSTAT) 0.4 MG SL tablet Place 1 tablet (0.4 mg total) under the tongue every 5 (five) minutes as needed for chest pain. MAX 3 DOSES IN 15 MINUTES. IF STILL PAIN CALL 911 Patient taking differently: Place  0.4 mg under the tongue every 5 (five) minutes x 3 doses as needed for chest pain (CALL 9-1-1, IF NO RELIEF). 10/07/22   Georgeanna Lea, MD  OXYGEN Inhale 5 L/min into the lungs as needed (for shortness of breath).    [provider]  pantoprazole (PROTONIX) 40 MG tablet Take one tablet by mouth twice daily as directed. Patient taking differently: Take 40 mg by mouth 2 (two) times daily before a meal. 04/15/22   Kozlow, Alvira Philips, MD  potassium  chloride SA (KLOR-CON M) 20 MEQ tablet Take 1 tablet (20 mEq total) by mouth daily. Patient taking differently: Take 20 mEq by mouth See admin instructions. Take 20 mEq by mouth once a day only when taking Furosemide/Lasix 06/11/22   Georgeanna Lea, MD  pravastatin (PRAVACHOL) 20 MG tablet Take 20 mg by mouth daily. 04/03/21   [provider]  predniSONE (DELTASONE) 1 MG tablet Take 3 mg by mouth daily. 01/12/19   [provider]  predniSONE (DELTASONE) 5 MG tablet Take 5 mg by mouth daily. 03/16/22   [provider]  Spacer/Aero-Holding Rudean Curt As directed Patient taking differently: 1 each by Other route See admin instructions. As directed 04/08/21   Luciano Cutter, MD  traZODone (DESYREL) 100 MG tablet Take 150 mg by mouth at bedtime. 05/18/20   [provider]      Allergies    Propranolol, Gabapentin, Metformin and related, Codeine, and Nitrofuran derivatives    Review of Systems   Review of Systems  Gastrointestinal:  Positive for diarrhea.  All other systems reviewed and are negative.   Physical Exam Updated Vital Signs BP (!) 175/70   Pulse (!) 123   Temp 97.8 F (36.6 C) (Oral)   Resp 19   Ht 5\' 4"  (1.626 m)   Wt 54.4 kg   SpO2 96%   BMI 20.60 kg/m  Physical Exam Vitals and nursing note reviewed.  Constitutional:      General: She is not in acute distress.    Appearance: Normal appearance. She is ill-appearing.  HENT:     Head: Normocephalic and atraumatic.  Eyes:     General:        Right eye: No discharge.        Left eye: No discharge.  Cardiovascular:     Rate and Rhythm: Regular rhythm. Tachycardia present.     Heart sounds: No murmur heard.    No friction rub. No gallop.     Comments: Persistent tachycardia throughout her evaluation with normal rhythm Pulmonary:     Effort: Pulmonary effort is normal.     Breath sounds: Normal breath sounds.  Abdominal:     General: Bowel sounds are normal.     Palpations:  Abdomen is soft.     Comments: Some mild tenderness to palpation of the abdomen and fullness over the bladder, no rebound, rigidity, guarding, no overt abdominal distention noted.  Skin:    General: Skin is warm and dry.     Capillary Refill: Capillary refill takes less than 2 seconds.  Neurological:     Mental Status: She is alert and oriented to person, place, and time.     Comments: Cranial nerves II through XII grossly intact.  Intact finger-nose, intact heel-to-shin.  Romberg negative, gait normal.  Alert and oriented x3.  Moves all 4 limbs spontaneously, normal coordination.  No pronator drift.  Intact strength 4 out of 5 bilateral upper and lower extremities.  Globally weak  with poor effort throughout but no focal unilateral deficit on strength exam   Psychiatric:        Mood and Affect: Mood normal.        Behavior: Behavior normal.     ED Results / Procedures / Treatments   Labs (all labs ordered are listed, but only abnormal results are displayed) Labs Reviewed  CBC - Abnormal; Notable for the following components:      Result Value   WBC 15.8 (*)    All other components within normal limits  COMPREHENSIVE METABOLIC PANEL - Abnormal; Notable for the following components:   Sodium 134 (*)    Potassium 3.2 (*)    Glucose, Bld 159 (*)    Calcium 8.3 (*)    Total Protein 5.8 (*)    Albumin 3.1 (*)    All other components within normal limits  MAGNESIUM - Abnormal; Notable for the following components:   Magnesium 1.6 (*)    All other components within normal limits  D-DIMER, QUANTITATIVE - Abnormal; Notable for the following components:   D-Dimer, Quant 3.76 (*)    All other components within normal limits  RESP PANEL BY RT-PCR (RSV, FLU A&B, COVID)  [id number]  C DIFFICILE QUICK SCREEN W PCR REFLEX    GASTROINTESTINAL PANEL BY PCR, STOOL (REPLACES STOOL CULTURE)  OVA + PARASITE EXAM  CK  URINALYSIS, ROUTINE W REFLEX MICROSCOPIC  CBC  I-STAT CG4 LACTIC ACID, ED   TROPONIN I (HIGH SENSITIVITY)  TROPONIN I (HIGH SENSITIVITY)    EKG EKG Interpretation Date/Time:  Monday April 26 2023 16:01:16 EDT Ventricular Rate:  112 PR Interval:  172 QRS Duration:  80 QT Interval:  305 QTC Calculation: 417 R Axis:   21  Text Interpretation: Sinus tachycardia LAE, consider biatrial enlargement Abnormal R-wave progression, early transition Nonspecific repol abnormality, diffuse leads Confirmed by Estanislado Pandy [phone]) on 04/26/2023 4:27:25 PM  Radiology CT Angio Chest PE W and/or Wo Contrast  Result Date: 04/26/2023 CLINICAL DATA:  Pulmonary embolism (PE) suspected, high prob; Abdominal pain, acute, nonlocalized EXAM: CT ANGIOGRAPHY CHEST CT ABDOMEN AND PELVIS WITH CONTRAST TECHNIQUE: Multidetector CT imaging of the chest was performed using the standard protocol during bolus administration of intravenous contrast. Multiplanar CT image reconstructions and MIPs were obtained to evaluate the vascular anatomy. Multidetector CT imaging of the abdomen and pelvis was performed using the standard protocol during bolus administration of intravenous contrast. RADIATION DOSE REDUCTION: This exam was performed according to the departmental dose-optimization program which includes automated exposure control, adjustment of the mA and/or kV according to patient size and/or use of iterative reconstruction technique. CONTRAST:  80mL OMNIPAQUE IOHEXOL 350 MG/ML SOLN COMPARISON:  CT chest 07/18/2021, CT abdomen pelvis 02/24/2023 FINDINGS: CTA CHEST FINDINGS Cardiovascular: There is adequate opacification of the pulmonary arterial tree. There are multiple thin web seen within the right lower lobar pulmonary artery best seen on image # 68/4 in keeping with the sequela of chronic pulmonary embolus. However, no intraluminal filling defect is identified to suggest acute pulmonary embolism. The central pulmonary arteries are of normal caliber. Moderate multi-vessel coronary artery calcification.  Global cardiac size is within normal limits. No pericardial effusion. Extensive atherosclerotic calcification within the thoracic aorta. No aortic aneurysm. Mediastinum/Nodes: No enlarged mediastinal, hilar, or axillary lymph nodes. Thyroid gland, trachea, and esophagus demonstrate no significant findings. Lungs/Pleura: Lungs are clear. No pneumothorax. Trace bilateral pleural effusions. No central obstructing lesion. Musculoskeletal: Surgical changes of left mastectomy and axillary lymph node dissection are identified. Stable  superior endplate fracture of T3. Interval development of a remote appearing superior endplate fracture of T6 with approximately 50% loss of height. No acute bone abnormality. No lytic or blastic bone lesion. Review of the MIP images confirms the above findings. CT ABDOMEN and PELVIS FINDINGS Hepatobiliary: No focal liver abnormality is seen. No gallstones, gallbladder wall thickening, or biliary dilatation. Pancreas: Cystic lesion within the uncinate process of the pancreas is unchanged measuring up to 3.7 cm in greatest dimension and, as noted on prior examination, demonstrates slow interval growth since remote prior examination of 12/09/2017 where this measured 11 mm in greatest dimension. Given its slow interval progression, a benign etiology such as a side branch IPMN is considered most likely. The pancreas is otherwise unremarkable; the main pancreatic duct is not dilated. No peripancreatic inflammatory changes or fluid collections are seen. Spleen: Unremarkable Adrenals/Urinary Tract: The adrenal glands are unremarkable. The kidneys are normal. Foley catheter balloon is seen within a decompressed bladder lumen. There is superimposed circumferential bladder wall thickening and mild hyperemia noted, however, suggesting a superimposed infectious or inflammatory cystitis. Mild perivesicular inflammatory changes noted. Stomach/Bowel: There is marked hyperemia and circumferential wall  thickening involving the rectosigmoid colon and rectum with mild perirectal inflammatory stranding noted in keeping with an infectious or inflammatory proctocolitis as can be seen with ulcerative colitis or pseudomembranous colitis. There is no evidence of obstruction or perforation. No free intraperitoneal gas. Trace free fluid within the pelvis. The stomach and small bowel are unremarkable. Appendix absent. Vascular/Lymphatic: Extensive aortoiliac atherosclerotic calcification with probable hemodynamically significant stenoses of the celiac axis and proximal superior and inferior mesenteric arteries as well as the renal arteries bilaterally. Calcified plaque within the infrarenal abdominal aorta may result in hemodynamically significant stenosis in this location as well. No aortic aneurysm. No pathologic adenopathy within the abdomen and pelvis. Reproductive: Status post hysterectomy. No adnexal masses. Other: No abdominal wall hernia Musculoskeletal: No acute bone abnormality. No lytic or blastic bone lesion. Degenerative changes are seen within the lumbar spine. Remote L1 and L2 compression deformities are identified. No acute bone abnormality. Review of the MIP images confirms the above findings. IMPRESSION: 1. No definite evidence of acute pulmonary embolism. Multiple thin webs within the right lower lobar pulmonary artery in keeping with the sequela of chronic pulmonary embolus. 2. Moderate multi-vessel coronary artery calcification. 3. Trace bilateral pleural effusions. 4. Interval development of a remote appearing superior endplate fracture of T6 with approximately 50% loss of height. 5. Stable cystic lesion within the uncinate process of the pancreas measuring up to 3.7 cm in greatest dimension and, as noted on prior examination, demonstrates slow interval growth since remote prior examination of 12/09/2017 where this measured 11 mm in greatest dimension. Given its slow interval progression, a benign  etiology such as a side branch IPMN is considered most likely. 6. Extensive aortoiliac atherosclerotic calcification with probable hemodynamically significant stenoses of the celiac axis, proximal superior and inferior mesenteric arteries, and renal arteries bilaterally. Calcified plaque within the infrarenal abdominal aorta may result in hemodynamically significant stenosis in this location as well. Clinical correlation for signs and symptoms of chronic mesenteric ischemia or hemodynamically significant renal artery stenosis may be helpful. 7. Infectious or inflammatory proctocolitis as can be seen with ulcerative colitis or pseudomembranous colitis. No evidence of obstruction or perforation. 8.  Aortic Atherosclerosis (ICD10-I70.0). Electronically Signed   By: Helyn Numbers M.D.   On: 04/26/2023 22:04   CT ABDOMEN PELVIS W CONTRAST  Result Date: 04/26/2023 CLINICAL DATA:  Pulmonary embolism (PE) suspected, high prob; Abdominal pain, acute, nonlocalized EXAM: CT ANGIOGRAPHY CHEST CT ABDOMEN AND PELVIS WITH CONTRAST TECHNIQUE: Multidetector CT imaging of the chest was performed using the standard protocol during bolus administration of intravenous contrast. Multiplanar CT image reconstructions and MIPs were obtained to evaluate the vascular anatomy. Multidetector CT imaging of the abdomen and pelvis was performed using the standard protocol during bolus administration of intravenous contrast. RADIATION DOSE REDUCTION: This exam was performed according to the departmental dose-optimization program which includes automated exposure control, adjustment of the mA and/or kV according to patient size and/or use of iterative reconstruction technique. CONTRAST:  80mL OMNIPAQUE IOHEXOL 350 MG/ML SOLN COMPARISON:  CT chest 07/18/2021, CT abdomen pelvis 02/24/2023 FINDINGS: CTA CHEST FINDINGS Cardiovascular: There is adequate opacification of the pulmonary arterial tree. There are multiple thin web seen within the right  lower lobar pulmonary artery best seen on image # 68/4 in keeping with the sequela of chronic pulmonary embolus. However, no intraluminal filling defect is identified to suggest acute pulmonary embolism. The central pulmonary arteries are of normal caliber. Moderate multi-vessel coronary artery calcification. Global cardiac size is within normal limits. No pericardial effusion. Extensive atherosclerotic calcification within the thoracic aorta. No aortic aneurysm. Mediastinum/Nodes: No enlarged mediastinal, hilar, or axillary lymph nodes. Thyroid gland, trachea, and esophagus demonstrate no significant findings. Lungs/Pleura: Lungs are clear. No pneumothorax. Trace bilateral pleural effusions. No central obstructing lesion. Musculoskeletal: Surgical changes of left mastectomy and axillary lymph node dissection are identified. Stable superior endplate fracture of T3. Interval development of a remote appearing superior endplate fracture of T6 with approximately 50% loss of height. No acute bone abnormality. No lytic or blastic bone lesion. Review of the MIP images confirms the above findings. CT ABDOMEN and PELVIS FINDINGS Hepatobiliary: No focal liver abnormality is seen. No gallstones, gallbladder wall thickening, or biliary dilatation. Pancreas: Cystic lesion within the uncinate process of the pancreas is unchanged measuring up to 3.7 cm in greatest dimension and, as noted on prior examination, demonstrates slow interval growth since remote prior examination of 12/09/2017 where this measured 11 mm in greatest dimension. Given its slow interval progression, a benign etiology such as a side branch IPMN is considered most likely. The pancreas is otherwise unremarkable; the main pancreatic duct is not dilated. No peripancreatic inflammatory changes or fluid collections are seen. Spleen: Unremarkable Adrenals/Urinary Tract: The adrenal glands are unremarkable. The kidneys are normal. Foley catheter balloon is seen within  a decompressed bladder lumen. There is superimposed circumferential bladder wall thickening and mild hyperemia noted, however, suggesting a superimposed infectious or inflammatory cystitis. Mild perivesicular inflammatory changes noted. Stomach/Bowel: There is marked hyperemia and circumferential wall thickening involving the rectosigmoid colon and rectum with mild perirectal inflammatory stranding noted in keeping with an infectious or inflammatory proctocolitis as can be seen with ulcerative colitis or pseudomembranous colitis. There is no evidence of obstruction or perforation. No free intraperitoneal gas. Trace free fluid within the pelvis. The stomach and small bowel are unremarkable. Appendix absent. Vascular/Lymphatic: Extensive aortoiliac atherosclerotic calcification with probable hemodynamically significant stenoses of the celiac axis and proximal superior and inferior mesenteric arteries as well as the renal arteries bilaterally. Calcified plaque within the infrarenal abdominal aorta may result in hemodynamically significant stenosis in this location as well. No aortic aneurysm. No pathologic adenopathy within the abdomen and pelvis. Reproductive: Status post hysterectomy. No adnexal masses. Other: No abdominal wall hernia Musculoskeletal: No acute bone abnormality. No lytic or blastic bone lesion. Degenerative changes are seen within  the lumbar spine. Remote L1 and L2 compression deformities are identified. No acute bone abnormality. Review of the MIP images confirms the above findings. IMPRESSION: 1. No definite evidence of acute pulmonary embolism. Multiple thin webs within the right lower lobar pulmonary artery in keeping with the sequela of chronic pulmonary embolus. 2. Moderate multi-vessel coronary artery calcification. 3. Trace bilateral pleural effusions. 4. Interval development of a remote appearing superior endplate fracture of T6 with approximately 50% loss of height. 5. Stable cystic lesion  within the uncinate process of the pancreas measuring up to 3.7 cm in greatest dimension and, as noted on prior examination, demonstrates slow interval growth since remote prior examination of 12/09/2017 where this measured 11 mm in greatest dimension. Given its slow interval progression, a benign etiology such as a side branch IPMN is considered most likely. 6. Extensive aortoiliac atherosclerotic calcification with probable hemodynamically significant stenoses of the celiac axis, proximal superior and inferior mesenteric arteries, and renal arteries bilaterally. Calcified plaque within the infrarenal abdominal aorta may result in hemodynamically significant stenosis in this location as well. Clinical correlation for signs and symptoms of chronic mesenteric ischemia or hemodynamically significant renal artery stenosis may be helpful. 7. Infectious or inflammatory proctocolitis as can be seen with ulcerative colitis or pseudomembranous colitis. No evidence of obstruction or perforation. 8.  Aortic Atherosclerosis (ICD10-I70.0). Electronically Signed   By: Helyn Numbers M.D.   On: 04/26/2023 22:04   DG Chest Portable 1 View  Result Date: 04/26/2023 CLINICAL DATA:  Syncope, fall EXAM: PORTABLE CHEST 1 VIEW COMPARISON:  10/22/2022 FINDINGS: Cardiac size is within normal limits. Thoracic aorta is tortuous. Calcifications are seen in thoracic aorta. There are no signs of pulmonary edema or focal pulmonary consolidation. There is no pleural effusion or pneumothorax. Surgical clips are seen in left chest wall. IMPRESSION: No active cardiopulmonary disease. Electronically Signed   By: Ernie Avena M.D.   On: 04/26/2023 17:44    Procedures Procedures    Medications Ordered in ED Medications  fidaxomicin (DIFICID) tablet 200 mg (has no administration in time range)  amLODipine (NORVASC) tablet 5 mg (has no administration in time range)  carvedilol (COREG) tablet 6.25 mg (has no administration in time  range)  hydrALAZINE (APRESOLINE) injection 10 mg (has no administration in time range)  isosorbide mononitrate (IMDUR) 24 hr tablet 30 mg (has no administration in time range)  losartan (COZAAR) tablet 50 mg (has no administration in time range)  heparin bolus via infusion 1,500 Units (has no administration in time range)  heparin ADULT infusion 100 units/mL (25000 units/225mL) (has no administration in time range)  sodium chloride 0.9 % bolus 1,000 mL (0 mLs Intravenous Stopped 04/26/23 1841)  potassium chloride SA (KLOR-CON M) CR tablet 40 mEq (40 mEq Oral Given 04/26/23 1842)  magnesium oxide (MAG-OX) tablet 400 mg (400 mg Oral Given 04/26/23 1842)  sodium chloride 0.9 % bolus 1,000 mL (1,000 mLs Intravenous New Bag/Given 04/26/23 1841)  iohexol (OMNIPAQUE) 350 MG/ML injection 100 mL (80 mLs Intravenous Contrast Given 04/26/23 2120)    ED Course/ Medical Decision Making/ A&P Clinical Course as of 04/26/23 2310  Mon Apr 26, 2023  1543 "Never felt like this ever" feeling generally weak. Fell vs collapsed secondary to muscle weakness. Neuro exam normal throughout.  [CP]    Clinical Course User Index [CP] Olene Floss, PA-C  Medical Decision Making  This patient is a 87 y.o. female who presents to the ED for concern of generalized weakness, diarrhea, this involves an extensive number of treatment options, and is a complaint that carries with it a high risk of complications and morbidity. The emergent differential diagnosis prior to evaluation includes, but is not limited to,  CVA, spinal cord injury, ACS, arrhythmia, syncope, orthostatic hypotension, sepsis, hypoglycemia, hypoxia, electrolyte disturbance, endocrine disorder, anemia, environmental exposure, polypharmacy, concerned for recurrent cdiff vs abdominal abscess, other infectious colitis, vs other . This is not an exhaustive differential.   Past Medical History / Co-morbidities / Social History: HTN,  GERD, polymyalgia, history of radiation, chemo 2/2 breast cancer, HLD  Additional history: Chart reviewed. Pertinent results include: reviewed labwork, imaging from previous hospital admission including recent follow up with negative Cdiff PCR a few days ago  Physical Exam: Physical exam performed. The pertinent findings include: Cranial nerves II through XII grossly intact.  Intact finger-nose, intact heel-to-shin.  Romberg negative, gait normal.  Alert and oriented x3.  Moves all 4 limbs spontaneously, normal coordination.  No pronator drift.  Intact strength 4 out of 5 bilateral upper and lower extremities.  Globally weak with poor effort throughout but no focal unilateral deficit on strength exam  Some mild tenderness to palpation of the abdomen and fullness over the bladder, no rebound, rigidity, guarding, no overt abdominal distention noted.   Persistent tachycardia throughout her evaluation with normal rhythm  Lab Tests: I ordered, and personally interpreted labs.  The pertinent results include:  elevated D dimer, 3.76, we will pursue CT PE study to rule out PE. Magnesium is mildly decreased at 1.6. RVP negative for covid, flu, rsv. Normal CK. CMP with mild hyponatremia sodium 134. Potassium 3.2, we will orally replete. Protein calorie malnutrition noted total protein 5.8, albumin 3.1. Not significantly changed from baseline. Patient does have a moderate leukocytosis, WBC 15.8, suspicious for new developing infection vs recurrent C diff vs other.    Imaging Studies: I ordered imaging studies including plain film cxr, CT abd pelvis w contrast, CT PE study. I independently visualized and interpreted imaging which showed multiple notable findings:  1. No definite evidence of acute pulmonary embolism. Multiple thin  webs within the right lower lobar pulmonary artery in keeping with  the sequela of chronic pulmonary embolus.  2. Moderate multi-vessel coronary artery calcification.  3. Trace  bilateral pleural effusions.  4. Interval development of a remote appearing superior endplate  fracture of T6 with approximately 50% loss of height.  5. Stable cystic lesion within the uncinate process of the pancreas  measuring up to 3.7 cm in greatest dimension and, as noted on prior  examination, demonstrates slow interval growth since remote prior  examination of 12/09/2017 where this measured 11 mm in greatest  dimension. Given its slow interval progression, a benign etiology  such as a side branch IPMN is considered most likely.  6. Extensive aortoiliac atherosclerotic calcification with probable  hemodynamically significant stenoses of the celiac axis, proximal  superior and inferior mesenteric arteries, and renal arteries  bilaterally. Calcified plaque within the infrarenal abdominal aorta  may result in hemodynamically significant stenosis in this location  as well. Clinical correlation for signs and symptoms of chronic  mesenteric ischemia or hemodynamically significant renal artery  stenosis may be helpful.  7. Infectious or inflammatory proctocolitis as can be seen with  ulcerative colitis or pseudomembranous colitis. No evidence of  obstruction or perforation.  . I agree with  the radiologist interpretation.   Cardiac Monitoring:  The patient was maintained on a cardiac monitor.  My attending physician Dr. Maple Hudson viewed and interpreted the cardiac monitored which showed an underlying rhythm of: sinus tachycardia. I agree with this interpretation.   Medications: I ordered medication including multiple fluid bolus for persistent tachycardia, oral potassium magnesium for hypokalemia, hypomagnesemia, had a discussion with the hospitalist, and we will begin heparin for possible chronic PE with new persistent tachycardia, as well as discussed beginning think for possible recurrent C. difficile versus new colitis  Consultations Obtained: I requested consultation with the hospitalist,  spoke with Dr. Janalyn Shy,  and discussed lab and imaging findings as well as pertinent plan - they recommend: Hospital admission for generalized weakness, persistent tachycardia, colitis   Disposition: After consideration of the diagnostic results and the patients response to treatment, I feel that patient would benefit from admission for above.   I discussed this case with my attending physician Dr. Maple Hudson who cosigned this note including patient's presenting symptoms, physical exam, and planned diagnostics and interventions. Attending physician stated agreement with plan or made changes to plan which were implemented.    Final Clinical Impression(s) / ED Diagnoses Final diagnoses:  Colitis    Rx / DC Orders ED Discharge Orders     None         Olene Floss, PA-C 04/26/23 2310    Coral Spikes, DO 04/26/23 2314

## 2023-04-26 NOTE — Telephone Encounter (Signed)
Pt husband Jomarie Longs  stated that the treatment is not working. Jomarie Longs  stated that she is having increase diarrhea, starting yesterday and throughout the night and the day. Jomarie Longs stated that the pt is getting very weak and now just laying  on a cot on  the floor and not even able to go to the bathroom.  Larey Seat off the toilet last night,and  has not ate anything today and not even drank a 8 oz of water.  Jomarie Longs was notified with her current symptoms then she should go to the ED for immediate assessment/evaluation and treatment. Jomarie Longs stated that he does not know how he will get her there. Jomarie Longs was notified to call 911 to help transport her.  Jomarie Longs was notified that I would make Dr. Chales Abrahams aware. Please advise if other recommendations:

## 2023-04-26 NOTE — ED Triage Notes (Signed)
Pt BIB [facility] from home, stating Pt fell during the night and slept in the floor bc she could not get back up. Husband stated once she got up she had diarrhea and recent C diff infection in June

## 2023-04-26 NOTE — Telephone Encounter (Signed)
Inbound cal from patient informing us she is at the ED.

## 2023-04-26 NOTE — H&P (Incomplete)
History and Physical    Jodi Valdez XBM:[id number] DOB: [birth year] DOA: 04/26/2023  PCP: Ailene Ravel, MD   Patient coming from: Home   Chief Complaint:  Chief Complaint  Patient presents with  . Diarrhea    HPI:  Jodi Valdez is a 87 y.o. female with medical history significant of breast cancer status post completion of chemo/radiation/mastectomy 2022, essential hypertension, hyperlipidemia, grade 1 on diastolic heart failure with preserved EF 60 to 65%, hypothyroidism, reactive airway disease on as needed oxygen and Pulmicort nebulizer, generalized anxiety disorder and polymyalgia presented to emergency department after development of 2 episodes of high-volume loose stool and generalized weakness associated with near fall.  Patient is also endorsing rectal burning sensation and pain.  Denies any abdominal pain, abdominal cramping, nausea and vomiting. Patient denies any chest pain, chest pressure, palpitation, shortness of breath, headache, recent change of appetite and change of weight.  Per chart review patient has recent hospital admission on 6/5 to 6/7 for acute colitis in the setting of recurrent C. difficile infection.  CT abdomen showed pancolitis.  Patient was evaluated with ID and GI recommended oral vancomycin taper course for 6 weeks.  She remained stable and tolerate oral diet and discharged home on 6/7.  Patient completed 6 weeks course of vancomycin taper. Per chart review GI Dr. Chales Abrahams had a phone interview with patient on 04/20/2023.  Due to persistent diarrhea recommended trial of cholestyramine 4 g p.o. twice daily, Imodium as needed.   ED Course:  At presentation to ED patient is tachycardic 123, blood pressure elevated 17 5/70, respiratory rate 18 and O2 sat 96% room air.  CBC showed elevated WBC count 15.8, RBC 4.26, hemoglobin 13.6, hematocrit 40.9 and platelet 195 Pressure slightly low sodium 134, low potassium 3.2, chloride 103, bicarb 22,  blood glucose 159, BUN 18, creatinine 0.91, calcium 0.83, low albumin 3.1, GFR above 60 and anion gap 9.  Low mag 1.6. Lactic acid 1.9 within normal range. Pending GI and C. difficile panel. Respiratory panel negative.  Troponin x 2 within normal range.  ED showed sinus tachycardia heart rate 112.  No ST and T wave abnormality.  Due to persistent tachycardia check D-dimer which is elevated 3.76.  Imaging CTA and CT abdomen pelvis with contrast finding following: IMPRESSION: 1. No definite evidence of acute pulmonary embolism. Multiple thin webs within the right lower lobar pulmonary artery in keeping with the sequela of chronic pulmonary embolus. 2. Moderate multi-vessel coronary artery calcification. 3. Trace bilateral pleural effusions. 4. Interval development of a remote appearing superior endplate fracture of T6 with approximately 50% loss of height. 5. Stable cystic lesion within the uncinate process of the pancreas measuring up to 3.7 cm in greatest dimension and, as noted on prior examination, demonstrates slow interval growth since remote prior examination of 12/09/2017 where this measured 11 mm in greatest dimension. Given its slow interval progression, a benign etiology such as a side branch IPMN is considered most likely. 6. Extensive aortoiliac atherosclerotic calcification with probable hemodynamically significant stenoses of the celiac axis, proximal superior and inferior mesenteric arteries, and renal arteries bilaterally. Calcified plaque within the infrarenal abdominal aorta may result in hemodynamically significant stenosis in this location as well. Clinical correlation for signs and symptoms of chronic mesenteric ischemia or hemodynamically significant renal artery stenosis may be helpful. 7. Infectious or inflammatory proctocolitis as can be seen with ulcerative colitis or pseudomembranous colitis. No evidence of obstruction or perforation. 8.  Aortic  Atherosclerosis  In the ED patient has been resuscitated with 2 L of normal saline bolus given that patient remained tachycardic 123.  Blood pressure persistently found elevated to 175/70.  Review of Systems:  Review of Systems  Constitutional:  Positive for malaise/fatigue. Negative for chills, diaphoresis, fever and weight loss.  Respiratory:  Negative for cough, sputum production and shortness of breath.   Cardiovascular:  Negative for chest pain and palpitations.  Gastrointestinal:  Positive for diarrhea. Negative for abdominal pain, blood in stool, constipation, heartburn, melena, nausea and vomiting.       Rectal pain  Genitourinary:  Negative for dysuria, frequency and urgency.  Musculoskeletal:  Negative for myalgias.  Skin:  Negative for rash.  Neurological:  Negative for dizziness and headaches.  Psychiatric/Behavioral:  The patient is not nervous/anxious.     Past Medical History:  Diagnosis Date  . Allergy   . Anemia   . Asthma   . Breast cancer (HCC)   . Cancer (HCC) 11/01/2000   left breast cancer  . GERD (gastroesophageal reflux disease)   . Heart disease   . Hyperlipidemia   . Hypertension   . Long COVID   . Personal history of chemotherapy    2002  . Personal history of radiation therapy    2002  . Polymyalgia (HCC)     Past Surgical History:  Procedure Laterality Date  . ABDOMINAL HYSTERECTOMY  1975  . BREAST SURGERY Left 11/01/2000   masectomy  . COLONOSCOPY  2015  . CYSTOCELE REPAIR  09/1999  . MASTECTOMY Left    2002 with tramflap  . RECTOCELE REPAIR  09/1999     reports that she has never smoked. She has never used smokeless tobacco. She reports that she does not drink alcohol and does not use drugs.  Allergies  Allergen Reactions  . Propranolol Other (See Comments)    Hallucinations   . Gabapentin Other (See Comments)    Hallucinations  . Metformin And Related Diarrhea  . Codeine Nausea And Vomiting  . Nitrofuran Derivatives Nausea  And Vomiting and Other (See Comments)    GI Intolerance    Family History  Problem Relation Age of Onset  . Colon cancer Neg Hx   . Colon polyps Neg Hx   . Diabetes Neg Hx   . Kidney disease Neg Hx   . Esophageal cancer Neg Hx     Prior to Admission medications   Medication Sig Start Date End Date Taking? Authorizing Provider  Acetylcysteine (NAC PO) Take 1 capsule by mouth daily.    [provider]  albuterol (VENTOLIN HFA) 108 (90 Base) MCG/ACT inhaler Inhale 2 puffs into the lungs every 6 (six) hours as needed for wheezing or shortness of breath. 08/20/21   Luciano Cutter, MD  alendronate (FOSAMAX) 70 MG tablet Take 70 mg by mouth every Monday. 12/12/13   [provider]  amLODipine (NORVASC) 5 MG tablet Take 5 mg by mouth 2 (two) times daily. 10/20/22   [provider]  ascorbic acid (VITAMIN C) 1000 MG tablet Take 1,000 mg by mouth daily. 06/17/20   [provider]  budesonide (PULMICORT) 0.5 MG/2ML nebulizer solution Use one vial in nebulizer twice daily as directed. Patient taking differently: Take 0.5 mg by nebulization 2 (two) times daily as needed (for flares). 12/07/22   Kozlow, Alvira Philips, MD  carvedilol (COREG) 6.25 MG tablet Take 6.25 mg by mouth 2 (two) times daily with a meal.    [provider]  Cholecalciferol (VITAMIN  D-3) 125 MCG (5000 UT) TABS Take 5,000 Units by mouth 2 (two) times daily.    [provider]  cholestyramine (QUESTRAN) 4 g packet Take 1 packet (4 g total) by mouth daily. 04/22/23   Lynann Bologna, MD  EPINEPHrine 0.3 mg/0.3 mL IJ SOAJ injection Use as directed for life-threatening allergic reaction. Patient taking differently: Inject 0.3 mg into the muscle once as needed (as directed for a life-threatening allergic reaction). 12/13/17   Kozlow, Alvira Philips, MD  furosemide (LASIX) 40 MG tablet TAKE 1 TABLET BY MOUTH EVERY DAY 04/09/23   Georgeanna Lea, MD  isosorbide mononitrate (IMDUR) 30 MG 24 hr tablet Take  1 tablet (30 mg total) by mouth daily. 11/09/22   Georgeanna Lea, MD  levothyroxine (SYNTHROID) 88 MCG tablet Take 1 tablet (88 mcg total) by mouth daily. Patient taking differently: Take 88 mcg by mouth daily before breakfast. 10/30/22   Shamleffer, Konrad Dolores, MD  losartan (COZAAR) 50 MG tablet Take 50 mg by mouth daily.    [provider]  Multiple Vitamins-Minerals (ONE DAILY MULTIVIT-MIN ADULT PO) Take 1 tablet by mouth daily.    [provider]  Multiple Vitamins-Minerals (PRESERVISION/LUTEIN PO) Take 1 capsule by mouth 2 (two) times daily. Unknown strength    [provider]  nitroGLYCERIN (NITROSTAT) 0.4 MG SL tablet Place 1 tablet (0.4 mg total) under the tongue every 5 (five) minutes as needed for chest pain. MAX 3 DOSES IN 15 MINUTES. IF STILL PAIN CALL 911 Patient taking differently: Place 0.4 mg under the tongue every 5 (five) minutes x 3 doses as needed for chest pain (CALL 9-1-1, IF NO RELIEF). 10/07/22   Georgeanna Lea, MD  OXYGEN Inhale 5 L/min into the lungs as needed (for shortness of breath).    [provider]  pantoprazole (PROTONIX) 40 MG tablet Take one tablet by mouth twice daily as directed. Patient taking differently: Take 40 mg by mouth 2 (two) times daily before a meal. 04/15/22   Kozlow, Alvira Philips, MD  potassium chloride SA (KLOR-CON M) 20 MEQ tablet Take 1 tablet (20 mEq total) by mouth daily. Patient taking differently: Take 20 mEq by mouth See admin instructions. Take 20 mEq by mouth once a day only when taking Furosemide/Lasix 06/11/22   Georgeanna Lea, MD  pravastatin (PRAVACHOL) 20 MG tablet Take 20 mg by mouth daily. 04/03/21   [provider]  predniSONE (DELTASONE) 1 MG tablet Take 3 mg by mouth daily. 01/12/19   [provider]  predniSONE (DELTASONE) 5 MG tablet Take 5 mg by mouth daily. 03/16/22   [provider]  Spacer/Aero-Holding Rudean Curt As directed Patient taking differently: 1  each by Other route See admin instructions. As directed 04/08/21   Luciano Cutter, MD  traZODone (DESYREL) 100 MG tablet Take 150 mg by mouth at bedtime. 05/18/20   [provider]     Physical Exam: Vitals:   04/26/23 2200 04/26/23 2215 04/26/23 2230 04/26/23 2315  BP: (!) 172/58 (!) 184/69 (!) 175/70   Pulse: (!) 115 (!) 115 (!) 123   Resp: (!) 23 (!) 23 19   Temp:    98.2 F (36.8 C)  TempSrc:    Oral  SpO2: 95% 95% 96%   Weight:      Height:        Physical Exam Constitutional:      General: She is not in acute distress.    Appearance: She is ill-appearing.  HENT:  Nose: Nose normal.     Mouth/Throat:     Mouth: Mucous membranes are dry.  Eyes:     Pupils: Pupils are equal, round, and reactive to light.  Cardiovascular:     Rate and Rhythm: Tachycardia present.     Pulses: Normal pulses.     Heart sounds: Normal heart sounds.  Pulmonary:     Effort: Pulmonary effort is normal.     Breath sounds: Normal breath sounds.  Abdominal:     General: Bowel sounds are normal. There is no distension.     Tenderness: There is no abdominal tenderness. There is no guarding or rebound.  Musculoskeletal:     Cervical back: Neck supple.     Right lower leg: No edema.     Left lower leg: No edema.  Skin:    General: Skin is dry.     Capillary Refill: Capillary refill takes less than 2 seconds.  Neurological:     Mental Status: She is oriented to person, place, and time.  Psychiatric:        Mood and Affect: Mood normal.        Thought Content: Thought content normal.      Labs on Admission: I have personally reviewed following labs and imaging studies  CBC: Recent Labs  Lab 04/26/23 1604  WBC 15.8*  HGB 13.6  HCT 40.9  MCV 96.0  PLT 185   Basic Metabolic Panel: Recent Labs  Lab 04/26/23 1604  NA 134*  K 3.2*  CL 103  CO2 22  GLUCOSE 159*  BUN 18  CREATININE 0.91  CALCIUM 8.3*  MG 1.6*   GFR: Estimated Creatinine Clearance: 39.5 mL/min  (by C-G formula based on SCr of 0.91 mg/dL). Liver Function Tests: Recent Labs  Lab 04/26/23 1604  AST 16  ALT 28  ALKPHOS 41  BILITOT 0.7  PROT 5.8*  ALBUMIN 3.1*   No results for input(s): "LIPASE", "AMYLASE" in the last 168 hours. No results for input(s): "AMMONIA" in the last 168 hours. Coagulation Profile: No results for input(s): "INR", "PROTIME" in the last 168 hours. Cardiac Enzymes: Recent Labs  Lab 04/26/23 1604 04/26/23 1835  CKTOTAL 58  --   TROPONINIHS 16 16   BNP (last 3 results) No results for input(s): "BNP" in the last 8760 hours. HbA1C: No results for input(s): "HGBA1C" in the last 72 hours. CBG: No results for input(s): "GLUCAP" in the last 168 hours. Lipid Profile: No results for input(s): "CHOL", "HDL", "LDLCALC", "TRIG", "CHOLHDL", "LDLDIRECT" in the last 72 hours. Thyroid Function Tests: No results for input(s): "TSH", "T4TOTAL", "FREET4", "T3FREE", "THYROIDAB" in the last 72 hours. Anemia Panel: No results for input(s): "VITAMINB12", "FOLATE", "FERRITIN", "TIBC", "IRON", "RETICCTPCT" in the last 72 hours. Urine analysis:    Component Value Date/Time   COLORURINE YELLOW 04/26/2023 1734   APPEARANCEUR CLEAR 04/26/2023 1734   LABSPEC 1.010 04/26/2023 1734   LABSPEC 1.005 07/08/2007 1555   PHURINE 5.0 04/26/2023 1734   GLUCOSEU NEGATIVE 04/26/2023 1734   GLUCOSEU NEGATIVE 07/16/2014 1120   HGBUR NEGATIVE 04/26/2023 1734   BILIRUBINUR NEGATIVE 04/26/2023 1734   BILIRUBINUR Negative 07/08/2007 1555   KETONESUR NEGATIVE 04/26/2023 1734   PROTEINUR NEGATIVE 04/26/2023 1734   UROBILINOGEN 0.2 09/26/2014 1130   NITRITE NEGATIVE 04/26/2023 1734   LEUKOCYTESUR NEGATIVE 04/26/2023 1734   LEUKOCYTESUR Negative 07/08/2007 1555    Radiological Exams on Admission: I have personally reviewed images CT Angio Chest PE W and/or Wo Contrast  Result Date: 04/26/2023 CLINICAL DATA:  Pulmonary embolism (PE) suspected, high prob; Abdominal pain, acute,  nonlocalized EXAM: CT ANGIOGRAPHY CHEST CT ABDOMEN AND PELVIS WITH CONTRAST TECHNIQUE: Multidetector CT imaging of the chest was performed using the standard protocol during bolus administration of intravenous contrast. Multiplanar CT image reconstructions and MIPs were obtained to evaluate the vascular anatomy. Multidetector CT imaging of the abdomen and pelvis was performed using the standard protocol during bolus administration of intravenous contrast. RADIATION DOSE REDUCTION: This exam was performed according to the departmental dose-optimization program which includes automated exposure control, adjustment of the mA and/or kV according to patient size and/or use of iterative reconstruction technique. CONTRAST:  80mL OMNIPAQUE IOHEXOL 350 MG/ML SOLN COMPARISON:  CT chest 07/18/2021, CT abdomen pelvis 02/24/2023 FINDINGS: CTA CHEST FINDINGS Cardiovascular: There is adequate opacification of the pulmonary arterial tree. There are multiple thin web seen within the right lower lobar pulmonary artery best seen on image # 68/4 in keeping with the sequela of chronic pulmonary embolus. However, no intraluminal filling defect is identified to suggest acute pulmonary embolism. The central pulmonary arteries are of normal caliber. Moderate multi-vessel coronary artery calcification. Global cardiac size is within normal limits. No pericardial effusion. Extensive atherosclerotic calcification within the thoracic aorta. No aortic aneurysm. Mediastinum/Nodes: No enlarged mediastinal, hilar, or axillary lymph nodes. Thyroid gland, trachea, and esophagus demonstrate no significant findings. Lungs/Pleura: Lungs are clear. No pneumothorax. Trace bilateral pleural effusions. No central obstructing lesion. Musculoskeletal: Surgical changes of left mastectomy and axillary lymph node dissection are identified. Stable superior endplate fracture of T3. Interval development of a remote appearing superior endplate fracture of T6 with  approximately 50% loss of height. No acute bone abnormality. No lytic or blastic bone lesion. Review of the MIP images confirms the above findings. CT ABDOMEN and PELVIS FINDINGS Hepatobiliary: No focal liver abnormality is seen. No gallstones, gallbladder wall thickening, or biliary dilatation. Pancreas: Cystic lesion within the uncinate process of the pancreas is unchanged measuring up to 3.7 cm in greatest dimension and, as noted on prior examination, demonstrates slow interval growth since remote prior examination of 12/09/2017 where this measured 11 mm in greatest dimension. Given its slow interval progression, a benign etiology such as a side branch IPMN is considered most likely. The pancreas is otherwise unremarkable; the main pancreatic duct is not dilated. No peripancreatic inflammatory changes or fluid collections are seen. Spleen: Unremarkable Adrenals/Urinary Tract: The adrenal glands are unremarkable. The kidneys are normal. Foley catheter balloon is seen within a decompressed bladder lumen. There is superimposed circumferential bladder wall thickening and mild hyperemia noted, however, suggesting a superimposed infectious or inflammatory cystitis. Mild perivesicular inflammatory changes noted. Stomach/Bowel: There is marked hyperemia and circumferential wall thickening involving the rectosigmoid colon and rectum with mild perirectal inflammatory stranding noted in keeping with an infectious or inflammatory proctocolitis as can be seen with ulcerative colitis or pseudomembranous colitis. There is no evidence of obstruction or perforation. No free intraperitoneal gas. Trace free fluid within the pelvis. The stomach and small bowel are unremarkable. Appendix absent. Vascular/Lymphatic: Extensive aortoiliac atherosclerotic calcification with probable hemodynamically significant stenoses of the celiac axis and proximal superior and inferior mesenteric arteries as well as the renal arteries bilaterally.  Calcified plaque within the infrarenal abdominal aorta may result in hemodynamically significant stenosis in this location as well. No aortic aneurysm. No pathologic adenopathy within the abdomen and pelvis. Reproductive: Status post hysterectomy. No adnexal masses. Other: No abdominal wall hernia Musculoskeletal: No acute bone abnormality. No lytic or blastic bone lesion. Degenerative changes are seen  within the lumbar spine. Remote L1 and L2 compression deformities are identified. No acute bone abnormality. Review of the MIP images confirms the above findings. IMPRESSION: 1. No definite evidence of acute pulmonary embolism. Multiple thin webs within the right lower lobar pulmonary artery in keeping with the sequela of chronic pulmonary embolus. 2. Moderate multi-vessel coronary artery calcification. 3. Trace bilateral pleural effusions. 4. Interval development of a remote appearing superior endplate fracture of T6 with approximately 50% loss of height. 5. Stable cystic lesion within the uncinate process of the pancreas measuring up to 3.7 cm in greatest dimension and, as noted on prior examination, demonstrates slow interval growth since remote prior examination of 12/09/2017 where this measured 11 mm in greatest dimension. Given its slow interval progression, a benign etiology such as a side branch IPMN is considered most likely. 6. Extensive aortoiliac atherosclerotic calcification with probable hemodynamically significant stenoses of the celiac axis, proximal superior and inferior mesenteric arteries, and renal arteries bilaterally. Calcified plaque within the infrarenal abdominal aorta may result in hemodynamically significant stenosis in this location as well. Clinical correlation for signs and symptoms of chronic mesenteric ischemia or hemodynamically significant renal artery stenosis may be helpful. 7. Infectious or inflammatory proctocolitis as can be seen with ulcerative colitis or pseudomembranous  colitis. No evidence of obstruction or perforation. 8.  Aortic Atherosclerosis (ICD10-I70.0). Electronically Signed   By: Helyn Numbers M.D.   On: 04/26/2023 22:04   CT ABDOMEN PELVIS W CONTRAST  Result Date: 04/26/2023 CLINICAL DATA:  Pulmonary embolism (PE) suspected, high prob; Abdominal pain, acute, nonlocalized EXAM: CT ANGIOGRAPHY CHEST CT ABDOMEN AND PELVIS WITH CONTRAST TECHNIQUE: Multidetector CT imaging of the chest was performed using the standard protocol during bolus administration of intravenous contrast. Multiplanar CT image reconstructions and MIPs were obtained to evaluate the vascular anatomy. Multidetector CT imaging of the abdomen and pelvis was performed using the standard protocol during bolus administration of intravenous contrast. RADIATION DOSE REDUCTION: This exam was performed according to the departmental dose-optimization program which includes automated exposure control, adjustment of the mA and/or kV according to patient size and/or use of iterative reconstruction technique. CONTRAST:  80mL OMNIPAQUE IOHEXOL 350 MG/ML SOLN COMPARISON:  CT chest 07/18/2021, CT abdomen pelvis 02/24/2023 FINDINGS: CTA CHEST FINDINGS Cardiovascular: There is adequate opacification of the pulmonary arterial tree. There are multiple thin web seen within the right lower lobar pulmonary artery best seen on image # 68/4 in keeping with the sequela of chronic pulmonary embolus. However, no intraluminal filling defect is identified to suggest acute pulmonary embolism. The central pulmonary arteries are of normal caliber. Moderate multi-vessel coronary artery calcification. Global cardiac size is within normal limits. No pericardial effusion. Extensive atherosclerotic calcification within the thoracic aorta. No aortic aneurysm. Mediastinum/Nodes: No enlarged mediastinal, hilar, or axillary lymph nodes. Thyroid gland, trachea, and esophagus demonstrate no significant findings. Lungs/Pleura: Lungs are clear. No  pneumothorax. Trace bilateral pleural effusions. No central obstructing lesion. Musculoskeletal: Surgical changes of left mastectomy and axillary lymph node dissection are identified. Stable superior endplate fracture of T3. Interval development of a remote appearing superior endplate fracture of T6 with approximately 50% loss of height. No acute bone abnormality. No lytic or blastic bone lesion. Review of the MIP images confirms the above findings. CT ABDOMEN and PELVIS FINDINGS Hepatobiliary: No focal liver abnormality is seen. No gallstones, gallbladder wall thickening, or biliary dilatation. Pancreas: Cystic lesion within the uncinate process of the pancreas is unchanged measuring up to 3.7 cm in greatest dimension and, as noted  on prior examination, demonstrates slow interval growth since remote prior examination of 12/09/2017 where this measured 11 mm in greatest dimension. Given its slow interval progression, a benign etiology such as a side branch IPMN is considered most likely. The pancreas is otherwise unremarkable; the main pancreatic duct is not dilated. No peripancreatic inflammatory changes or fluid collections are seen. Spleen: Unremarkable Adrenals/Urinary Tract: The adrenal glands are unremarkable. The kidneys are normal. Foley catheter balloon is seen within a decompressed bladder lumen. There is superimposed circumferential bladder wall thickening and mild hyperemia noted, however, suggesting a superimposed infectious or inflammatory cystitis. Mild perivesicular inflammatory changes noted. Stomach/Bowel: There is marked hyperemia and circumferential wall thickening involving the rectosigmoid colon and rectum with mild perirectal inflammatory stranding noted in keeping with an infectious or inflammatory proctocolitis as can be seen with ulcerative colitis or pseudomembranous colitis. There is no evidence of obstruction or perforation. No free intraperitoneal gas. Trace free fluid within the pelvis.  The stomach and small bowel are unremarkable. Appendix absent. Vascular/Lymphatic: Extensive aortoiliac atherosclerotic calcification with probable hemodynamically significant stenoses of the celiac axis and proximal superior and inferior mesenteric arteries as well as the renal arteries bilaterally. Calcified plaque within the infrarenal abdominal aorta may result in hemodynamically significant stenosis in this location as well. No aortic aneurysm. No pathologic adenopathy within the abdomen and pelvis. Reproductive: Status post hysterectomy. No adnexal masses. Other: No abdominal wall hernia Musculoskeletal: No acute bone abnormality. No lytic or blastic bone lesion. Degenerative changes are seen within the lumbar spine. Remote L1 and L2 compression deformities are identified. No acute bone abnormality. Review of the MIP images confirms the above findings. IMPRESSION: 1. No definite evidence of acute pulmonary embolism. Multiple thin webs within the right lower lobar pulmonary artery in keeping with the sequela of chronic pulmonary embolus. 2. Moderate multi-vessel coronary artery calcification. 3. Trace bilateral pleural effusions. 4. Interval development of a remote appearing superior endplate fracture of T6 with approximately 50% loss of height. 5. Stable cystic lesion within the uncinate process of the pancreas measuring up to 3.7 cm in greatest dimension and, as noted on prior examination, demonstrates slow interval growth since remote prior examination of 12/09/2017 where this measured 11 mm in greatest dimension. Given its slow interval progression, a benign etiology such as a side branch IPMN is considered most likely. 6. Extensive aortoiliac atherosclerotic calcification with probable hemodynamically significant stenoses of the celiac axis, proximal superior and inferior mesenteric arteries, and renal arteries bilaterally. Calcified plaque within the infrarenal abdominal aorta may result in  hemodynamically significant stenosis in this location as well. Clinical correlation for signs and symptoms of chronic mesenteric ischemia or hemodynamically significant renal artery stenosis may be helpful. 7. Infectious or inflammatory proctocolitis as can be seen with ulcerative colitis or pseudomembranous colitis. No evidence of obstruction or perforation. 8.  Aortic Atherosclerosis (ICD10-I70.0). Electronically Signed   By: Helyn Numbers M.D.   On: 04/26/2023 22:04   DG Chest Portable 1 View  Result Date: 04/26/2023 CLINICAL DATA:  Syncope, fall EXAM: PORTABLE CHEST 1 VIEW COMPARISON:  10/22/2022 FINDINGS: Cardiac size is within normal limits. Thoracic aorta is tortuous. Calcifications are seen in thoracic aorta. There are no signs of pulmonary edema or focal pulmonary consolidation. There is no pleural effusion or pneumothorax. Surgical clips are seen in left chest wall. IMPRESSION: No active cardiopulmonary disease. Electronically Signed   By: Ernie Avena M.D.   On: 04/26/2023 17:44    EKG: My personal interpretation of EKG shows: Sinus  tachycardia heart rate 112.  No ST and T wave abnormality     Assessment/Plan: Principal Problem:   Proctocolitis Active Problems:   SIRS (systemic inflammatory response syndrome) (HCC)   Hyperlipidemia   Essential hypertension   Reactive airway disease   Concern for C. difficile colitis   Hypothyroidism   Hypertensive crisis   Hyponatremia   Hypokalemia   Hypomagnesemia   GAD (generalized anxiety disorder)   Diarrhea   Fall  Assessment and Plan: Idiopathic proctocolitis  Recent C. difficile colitis Diarrhea SIRS -Patient coming with complaining of generalized weakness and 2 episode of high-volume diarrhea with associated rectal pain. -Initial presentation patient found tachycardic to 123, leukocytosis 15.8 and CT abdomen finding of infectious or inflammatory proctocolitis which can be seen in ulcerative colitis or pseudomembranous  colitis.  No evidence of obstruction or perforation. -Per chart review patient has recent 1st recurrence of C. difficile associated colitis which has been treated with 6 weeks course of vancomycin tapper and discharged home on 02/26/2023.  During that hospital admission patient being evaluated with [facility] Dr. Chales Abrahams.  Recommended if recurrence or develop registrant treat with Dificid versus fecal microbiota transplant. -Based on patient history and imaging finding starting treatment with empirically with Dificid 200 mg twice daily for 10 days course. -Checking GI panel, C. difficile PCR and antigen panel and fecal stool and parasite. - Checking fecal calprotectin, ESR, and C-reactive protein. -Continue maintenance fluid LR 75 cc/h for 1 day. - Starting clear liquid diet and will advance as patient tolerates. -Consulted [facility] Dr. Chales Abrahams for evaluation in the a.m. -Admitted patient to progressive unit with cardiac monitoring.  New incidental finding of pulmonary embolism Sinus tachycardia -Patient found persistent tachycardic in the ED heart rate up to 123.  Elevated D-dimer 3.75. - CTA chest showed no evidence of acute pulmonary embolism.  Multiple thin waves within the right lower lobe pulmonary artery keeping with the sequela of chronic  chronic pulmonary embolism, unknown chronicity. -Stable H&H.  Starting treating with IV heparin drip.-Consult transition care team for Eliquis or rivaroxaban insurance coverage. -Obtaining echocardiogram.   Hypertensive crisis Essential hypertension -In the ED patient blood pressure remains persistently elevated up to 180/69. - Resumed home amlodipine 5 mg daily, Coreg 6.25 mg twice daily.  Blood pressure has been improved to 157/70.  Gradually decreasing blood pressure goal to decrease 25% in the first hour and gradually decrease over the course of next 24 hours. - Continue losartan and Imdur in the a.m. -Starting hydralazine 10 mg every 8 hour as  needed for systolic blood pressure more than 180.  Hyponatremia Hypokalemia Hypomagnesemia -Several electrolyte derangement on CMP. -Low sodium 324, and low potassium 3.2. -Low mag 1.6 - In the ED patient received 2 L normal saline bolus.,  KCl 8M EQ oral once and Mag-Ox 400 mg oral once. - Giving mag sulfate 2 g IV once. Checking morning sodium, potassium and mag level.  Mechanical fall -Patient sustained mechanical fall.  Denies any syncope and hitting of her head. - CK 58 WNL - Consulted PT and OT for evaluation  Hypothyroidism  Reactive airway disease  Essential hypertension Grade 1 diastolic heart failure with preserved EF  Generalized anxiety disorder       DVT prophylaxis:  {Blank single:19197::"Lovenox","SQ Heparin","IV heparin gtts","Xarelto","Eliquis","Coumadin","SCDs","***"} Code Status:  {Blank single:19197::"Full Code","DNR with Intubation","DNR/DNI(Do NOT Intubate)","Comfort Care","***"} Diet:  Family Communication:  ***  Disposition Plan:  ***  Consults:  ***  Admission status:   {Blank single:19197::"Observation","Inpatient"}, {Blank single:19197::"Med-Surg","Telemetry bed","Step  Down Unit"}  Severity of Illness: {Observation/Inpatient:21159}    Tereasa Coop, MD [facility]  How to contact the [facility] Attending or Consulting provider 7A - 7P or covering provider during after hours 7P -7A, for this patient.  Check the care team in [facility] and look for a) attending/consulting [facility] provider listed and b) the [facility] team listed Log into [email or web address] and use [place]'s universal password to access. If you do not have the password, please contact the hospital operator. Locate the [facility] provider you are looking for under [facility] and page to a number that you can be directly reached. If you still have difficulty reaching the provider, please page the [facility] (Director on Call) for the Hospitalists listed on amion for assistance.  04/26/2023, 11:51 PM

## 2023-04-26 NOTE — Telephone Encounter (Signed)
C. Diff treatment is not working for PT and is requesting a call back to discuss other option for relief. Please advise.

## 2023-04-27 ENCOUNTER — Ambulatory Visit: Payer: Medicare PPO | Admitting: Internal Medicine

## 2023-04-27 ENCOUNTER — Inpatient Hospital Stay (HOSPITAL_COMMUNITY): Payer: Medicare PPO

## 2023-04-27 ENCOUNTER — Encounter (HOSPITAL_COMMUNITY): Payer: Self-pay | Admitting: Internal Medicine

## 2023-04-27 ENCOUNTER — Other Ambulatory Visit (HOSPITAL_COMMUNITY): Payer: Self-pay

## 2023-04-27 ENCOUNTER — Telehealth (HOSPITAL_COMMUNITY): Payer: Self-pay | Admitting: Pharmacy Technician

## 2023-04-27 ENCOUNTER — Other Ambulatory Visit: Payer: Self-pay | Admitting: Cardiology

## 2023-04-27 DIAGNOSIS — M810 Age-related osteoporosis without current pathological fracture: Secondary | ICD-10-CM

## 2023-04-27 DIAGNOSIS — A0471 Enterocolitis due to Clostridium difficile, recurrent: Secondary | ICD-10-CM | POA: Diagnosis not present

## 2023-04-27 DIAGNOSIS — I2699 Other pulmonary embolism without acute cor pulmonale: Secondary | ICD-10-CM | POA: Diagnosis not present

## 2023-04-27 DIAGNOSIS — E063 Autoimmune thyroiditis: Secondary | ICD-10-CM

## 2023-04-27 DIAGNOSIS — I2602 Saddle embolus of pulmonary artery with acute cor pulmonale: Secondary | ICD-10-CM | POA: Diagnosis not present

## 2023-04-27 DIAGNOSIS — R933 Abnormal findings on diagnostic imaging of other parts of digestive tract: Secondary | ICD-10-CM | POA: Diagnosis not present

## 2023-04-27 DIAGNOSIS — U071 COVID-19: Secondary | ICD-10-CM

## 2023-04-27 DIAGNOSIS — K529 Noninfective gastroenteritis and colitis, unspecified: Secondary | ICD-10-CM | POA: Diagnosis not present

## 2023-04-27 DIAGNOSIS — K8689 Other specified diseases of pancreas: Secondary | ICD-10-CM

## 2023-04-27 DIAGNOSIS — K219 Gastro-esophageal reflux disease without esophagitis: Secondary | ICD-10-CM

## 2023-04-27 DIAGNOSIS — K862 Cyst of pancreas: Secondary | ICD-10-CM | POA: Insufficient documentation

## 2023-04-27 DIAGNOSIS — G4700 Insomnia, unspecified: Secondary | ICD-10-CM | POA: Insufficient documentation

## 2023-04-27 LAB — CBC WITH DIFFERENTIAL/PLATELET
Abs Immature Granulocytes: 0.06 10*3/uL (ref 0.00–0.07)
Basophils Absolute: 0.1 10*3/uL (ref 0.0–0.1)
Basophils Relative: 1 %
Eosinophils Absolute: 0 10*3/uL (ref 0.0–0.5)
Eosinophils Relative: 0 %
HCT: 35.5 % — ABNORMAL LOW (ref 36.0–46.0)
Hemoglobin: 11.8 g/dL — ABNORMAL LOW (ref 12.0–15.0)
Immature Granulocytes: 1 %
Lymphocytes Relative: 16 %
Lymphs Abs: 1.9 10*3/uL (ref 0.7–4.0)
MCH: 32.1 pg (ref 26.0–34.0)
MCHC: 33.2 g/dL (ref 30.0–36.0)
MCV: 96.5 fL (ref 80.0–100.0)
Monocytes Absolute: 1 10*3/uL (ref 0.1–1.0)
Monocytes Relative: 8 %
Neutro Abs: 9.3 10*3/uL — ABNORMAL HIGH (ref 1.7–7.7)
Neutrophils Relative %: 74 %
Platelets: 162 10*3/uL (ref 150–400)
RBC: 3.68 MIL/uL — ABNORMAL LOW (ref 3.87–5.11)
RDW: 14 % (ref 11.5–15.5)
WBC: 12.3 10*3/uL — ABNORMAL HIGH (ref 4.0–10.5)
nRBC: 0 % (ref 0.0–0.2)

## 2023-04-27 LAB — CBG MONITORING, ED
Glucose-Capillary: 132 mg/dL — ABNORMAL HIGH (ref 70–99)
Glucose-Capillary: 129 mg/dL — ABNORMAL HIGH (ref 70–99)
Glucose-Capillary: 127 mg/dL — ABNORMAL HIGH (ref 70–99)

## 2023-04-27 LAB — HEPARIN LEVEL (UNFRACTIONATED)
Heparin Unfractionated: <0.1 IU/mL — ABNORMAL LOW (ref 0.30–0.70)
Heparin Unfractionated: <0.1 IU/mL — ABNORMAL LOW (ref 0.30–0.70)

## 2023-04-27 LAB — MAGNESIUM: Magnesium: 2.3 mg/dL (ref 1.7–2.4)

## 2023-04-27 LAB — T4, FREE: Free T4: 1.35 ng/dL — ABNORMAL HIGH (ref 0.61–1.12)

## 2023-04-27 MED ORDER — IPRATROPIUM-ALBUTEROL 0.5-2.5 (3) MG/3ML IN SOLN: 3.0000 mL | Freq: Four times a day (QID) | RESPIRATORY_TRACT | Status: DC | PRN

## 2023-04-27 MED ORDER — CHOLESTYRAMINE LIGHT 4 G PO PACK
4.0000 g | PACK | Freq: Two times a day (BID) | ORAL | Status: DC
  Administered 2023-04-27 – 2023-04-30 (×6): 4 g via ORAL
  Filled 2023-04-27 (×7): qty 1

## 2023-04-27 MED ORDER — HEPARIN BOLUS VIA INFUSION
1500.0000 [IU] | Freq: Once | INTRAVENOUS | Status: AC
  Administered 2023-04-27: 1500 [IU] via INTRAVENOUS
  Filled 2023-04-27: qty 1500

## 2023-04-27 MED ORDER — VITAMIN D 25 MCG (1000 UNIT) PO TABS
5000.0000 [IU] | ORAL_TABLET | Freq: Two times a day (BID) | ORAL | Status: DC
  Administered 2023-04-27 – 2023-05-01 (×9): 5000 [IU] via ORAL
  Filled 2023-04-27 (×9): qty 5

## 2023-04-27 MED ORDER — PREDNISONE 5 MG PO TABS
8.0000 mg | ORAL_TABLET | Freq: Every day | ORAL | Status: DC
  Administered 2023-04-27 – 2023-05-01 (×5): 8 mg via ORAL
  Filled 2023-04-27 (×5): qty 3

## 2023-04-27 MED ORDER — ALENDRONATE SODIUM 70 MG PO TABS: 70.0000 mg | ORAL_TABLET | ORAL | Status: DC

## 2023-04-27 MED ORDER — MAGNESIUM SULFATE 2 GM/50ML IV SOLN
2.0000 g | Freq: Once | INTRAVENOUS | Status: AC
  Administered 2023-04-27: 2 g via INTRAVENOUS
  Filled 2023-04-27: qty 50

## 2023-04-27 MED ORDER — CHLORHEXIDINE GLUCONATE CLOTH 2 % EX PADS
6.0000 | MEDICATED_PAD | Freq: Every day | CUTANEOUS | Status: DC
  Administered 2023-04-27 – 2023-04-30 (×4): 6 via TOPICAL

## 2023-04-27 NOTE — Progress Notes (Signed)
OT Cancellation Note  Patient Details Name: Jodi Valdez MRN: [id number] DOB: Jul 11, 1936   Cancelled Treatment:    Reason Eval/Treat Not Completed: Medical issues which prohibited therapy Patient started on Heparin at 11 pm on 8/5 for PE. Therapy guidelines state that therapy is not indicated within the first 24 hours of heparin initiation. OT to continue to follow.  Rosalio Loud, MS Acute Rehabilitation Department Office# [phone]  04/27/2023, 6:42 AM

## 2023-04-27 NOTE — TOC Benefit Eligibility Note (Signed)
Patient Product/process development scientist completed.    The patient is insured through [employer]. Patient has Medicare and is not eligible for a copay card, but may be able to apply for patient assistance, if available.    Ran test claim for Dificid 200 mg and the current 10 day co-pay is $1,402.40.   This test claim was processed through [facility]- copay amounts may vary at other pharmacies due to pharmacy/plan contracts, or as the patient moves through the different stages of their insurance plan.     Roland Earl, CPHT Pharmacy Patient Advocate Specialist [facility] Patient Advocate Team Direct Number: [phone]  Fax: [phone]

## 2023-04-27 NOTE — Telephone Encounter (Signed)
Patient Advocate Encounter  Completed and sent Clostridium Difficile Associated Diarrhea Copay Assistance application for Dificid for this patient who is uninsured.    Patient is approved 04/27/2023 through 05/27/2023.  BIN      [id number] PCN    AS GRP    [id number] ID        [id number]     Roland Earl, CPhT Pharmacy Patient Advocate Specialist [facility] Patient Advocate Team Direct Number: [phone] Fax: [phone]

## 2023-04-27 NOTE — Progress Notes (Signed)
PROGRESS NOTE    Jodi Valdez  ZOX:[id number] DOB: 03/20/1936 DOA: 04/26/2023 PCP: Ailene Ravel, MD   Brief Narrative: Jodi Valdez is a 87 y.o. female with a history of breast cancer status post completion of chemotherapy/ radiation/ mastectomy, hypertension, hyperlipidemia, diastolic heart failure, Hashimoto's thyroiditis, COVID long-hauler syndrome, insomnia, polymyalgia rheumatica.  Patient presented secondary to diarrhea, weakness, near fall without syncope.  Patient found to have evidence of proctocolitis with evidence of C. difficile infection and started on fidaxomicin.  During evaluation, patient found to have evidence of an incidental chronic pulmonary embolism and was started on heparin IV.   Assessment/Plan:  Infectious proctocolitis Recurrent C. difficile colitis Patient presenting with significant diarrhea.  Concern for recurrent C. difficile as patient has recent history.  CT abdomen/pelvis was significant for evidence of proctocolitis concerning for an inflammatory versus infectious process.  [facility] consulted for evaluation and management.  Sepsis Present on admission.  Secondary to C. difficile infection.  Blood cultures obtained on admission.  Chronic pulmonary embolism No evidence of acute pulm embolism on CTA chest.  Incidental right lower lobe pulmonary artery findings consistent with chronic pulmonary embolism. Patient started on heparin drip -Continue heparin drip for now; once closer to discharge could consider transitioning to a DOAC -Follow-up Transthoracic Echocardiogram (ordered on admission)  Severe asymptomatic hypertension Blood pressure improved from overnight. -Continue Coreg, Imdur, losartan, amlodipine  Diastolic heart failure Chronic.  Stable.  Hyponatremia Mild. -Continue IV fluids  Hypokalemia Potassium down to 3.2 on admission.  Resolved with potassium supplementation.  Hypomagnesemia Magnesium  1.6 on admission.  Resolved with magnesium supplementation.  History of fall Noted.  PT and OT consulted for evaluation while inpatient.  Hashimoto's thyroiditis -Continue levothyroxine 88 mcg daily  COVID-19 long-hauler with shortness of breath -Continue duoneb PRN  Osteoporosis Patient is on Fosamax and vitamin D as an outpatient.  GERD -Continue Protonix  Hyperlipidemia -Continue pravastatin  Insomnia -Continue Trazodone  History of left breast cancer Noted. History of chemotherapy, radiation and mastectomy  Polymyalgia rheumatica -Continue prednisone 2 point  Pancreatic insufficiency -Continue cholestyramine    DVT prophylaxis: I will not be here after today we switched Wednesdays just to touch base with Code Status:   Code Status: Full Code Family Communication: Husband at bedside Disposition Plan: Discharge pending improvement of diarrhea and PT/OT eval   Consultants:  [facility]  Procedures:  Transthoracic Echocardiogram  Antimicrobials: Fidaxomicin     Subjective: Patient with continued diarrhea. No other issues/concerns at this time.  Objective: BP 130/63   Pulse 97   Temp 98.3 F (36.8 C) (Oral)   Resp 19   Ht 5\' 4"  (1.626 m)   Wt 54.4 kg   SpO2 97%   BMI 20.60 kg/m   Examination:  General exam: Appears calm and comfortable Respiratory system: Clear to auscultation. Respiratory effort normal. Cardiovascular system: S1 & S2 heard, RRR. Gastrointestinal system: Abdomen is nondistended, soft and nontender. Normal bowel sounds heard. Central nervous system: Alert and oriented. No focal neurological deficits. Musculoskeletal: No edema. No calf tenderness Skin: No cyanosis. No rashes Psychiatry: Judgement and insight appear normal. Mood & affect appropriate.    Data Reviewed: I have personally reviewed following labs and imaging studies   Last CBC Lab Results  Component Value Date   WBC 12.3 (H) 04/27/2023   HGB 11.8  (L) 04/27/2023   HCT 35.5 (L) 04/27/2023   MCV 96.5 04/27/2023   MCH 32.1 04/27/2023   RDW 14.0 04/27/2023  PLT 162 04/27/2023     Last metabolic panel Lab Results  Component Value Date   GLUCOSE 127 (H) 04/27/2023   NA 133 (L) 04/27/2023   K 3.6 04/27/2023   CL 106 04/27/2023   CO2 17 (L) 04/27/2023   BUN 11 04/27/2023   CREATININE 0.68 04/27/2023   GFRNONAA >60 04/27/2023   CALCIUM 7.3 (L) 04/27/2023   PHOS 3.2 02/25/2023   PROT 4.8 (L) 04/27/2023   ALBUMIN 2.5 (L) 04/27/2023   LABGLOB 1.9 09/30/2021   AGRATIO 1.9 09/30/2021   BILITOT 0.6 04/27/2023   ALKPHOS 39 04/27/2023   AST 19 04/27/2023   ALT 21 04/27/2023   ANIONGAP 10 04/27/2023     Creatinine Clearance: Estimated Creatinine Clearance: 45 mL/min (by C-G formula based on SCr of 0.68 mg/dL).  Recent Results (from the past 240 hour(s))  Clostridium Difficile by PCR([facility] only )     Status: None   Collection Time: 04/19/23 11:11 AM   Specimen: STOOL   Stool  Result Value Ref Range Status   Toxigenic C. Difficile by PCR Negative Negative Final  Resp panel by RT-PCR (RSV, Flu A&B, Covid) Anterior Nasal Swab     Status: None   Collection Time: 04/26/23  4:04 PM   Specimen: Anterior Nasal Swab  Result Value Ref Range Status   SARS Coronavirus 2 by RT PCR NEGATIVE NEGATIVE Final    Comment: (NOTE) SARS-CoV-2 target nucleic acids are NOT DETECTED.  The SARS-CoV-2 RNA is generally detectable in upper respiratory specimens during the acute phase of infection. The lowest concentration of SARS-CoV-2 viral copies this assay can detect is 138 copies/mL. A negative result does not preclude SARS-Cov-2 infection and should not be used as the sole basis for treatment or other patient management decisions. A negative result may occur with  improper specimen collection/handling, submission of specimen other than nasopharyngeal swab, presence of viral mutation(s) within the areas targeted by this assay, and inadequate  number of viral copies(<138 copies/mL). A negative result must be combined with clinical observations, patient history, and epidemiological information. The expected result is Negative.  Fact Sheet for Patients:  BloggerCourse.com  Fact Sheet for Healthcare Providers:  SeriousBroker.it  This test is no t yet approved or cleared by the [place] FDA and  has been authorized for detection and/or diagnosis of SARS-CoV-2 by FDA under an Emergency Use Authorization (EUA). This EUA will remain  in effect (meaning this test can be used) for the duration of the COVID-19 declaration under Section 564(b)(1) of the Act, 21 U.S.C.section 360bbb-3(b)(1), unless the authorization is terminated  or revoked sooner.       Influenza A by PCR NEGATIVE NEGATIVE Final   Influenza B by PCR NEGATIVE NEGATIVE Final    Comment: (NOTE) The Xpert Xpress SARS-CoV-2/FLU/RSV plus assay is intended as an aid in the diagnosis of influenza from Nasopharyngeal swab specimens and should not be used as a sole basis for treatment. Nasal washings and aspirates are unacceptable for Xpert Xpress SARS-CoV-2/FLU/RSV testing.  Fact Sheet for Patients: BloggerCourse.com  Fact Sheet for Healthcare Providers: SeriousBroker.it  This test is not yet approved or cleared by the [place] FDA and has been authorized for detection and/or diagnosis of SARS-CoV-2 by FDA under an Emergency Use Authorization (EUA). This EUA will remain in effect (meaning this test can be used) for the duration of the COVID-19 declaration under Section 564(b)(1) of the Act, 21 U.S.C. section 360bbb-3(b)(1), unless the authorization is terminated or revoked.  Resp Syncytial Virus by PCR NEGATIVE NEGATIVE Final    Comment: (NOTE) Fact Sheet for Patients: BloggerCourse.com  Fact Sheet for Healthcare  Providers: SeriousBroker.it  This test is not yet approved or cleared by the [place] FDA and has been authorized for detection and/or diagnosis of SARS-CoV-2 by FDA under an Emergency Use Authorization (EUA). This EUA will remain in effect (meaning this test can be used) for the duration of the COVID-19 declaration under Section 564(b)(1) of the Act, 21 U.S.C. section 360bbb-3(b)(1), unless the authorization is terminated or revoked.  Performed at [facility], [address], [place], [place] [address]   C Difficile Quick Screen w PCR reflex     Status: Abnormal   Collection Time: 04/27/23  1:04 AM   Specimen: STOOL  Result Value Ref Range Status   C Diff antigen POSITIVE (A) NEGATIVE Final   C Diff toxin POSITIVE (A) NEGATIVE Final   C Diff interpretation Toxin producing C. difficile detected.  Final    Comment: CRITICAL RESULT CALLED TO, READ BACK BY AND VERIFIED WITH: Jerilee Hoh RN @ [phone] ON 04/27/2023 BY ABDULHALIM,M Performed at [facility], [address], [place], [place] [address]       Radiology Studies: CT Angio Chest PE W and/or Wo Contrast  Result Date: 04/26/2023 CLINICAL DATA:  Pulmonary embolism (PE) suspected, high prob; Abdominal pain, acute, nonlocalized EXAM: CT ANGIOGRAPHY CHEST CT ABDOMEN AND PELVIS WITH CONTRAST TECHNIQUE: Multidetector CT imaging of the chest was performed using the standard protocol during bolus administration of intravenous contrast. Multiplanar CT image reconstructions and MIPs were obtained to evaluate the vascular anatomy. Multidetector CT imaging of the abdomen and pelvis was performed using the standard protocol during bolus administration of intravenous contrast. RADIATION DOSE REDUCTION: This exam was performed according to the departmental dose-optimization program which includes automated exposure control, adjustment of the mA and/or kV according to patient size and/or  use of iterative reconstruction technique. CONTRAST:  80mL OMNIPAQUE IOHEXOL 350 MG/ML SOLN COMPARISON:  CT chest 07/18/2021, CT abdomen pelvis 02/24/2023 FINDINGS: CTA CHEST FINDINGS Cardiovascular: There is adequate opacification of the pulmonary arterial tree. There are multiple thin web seen within the right lower lobar pulmonary artery best seen on image # 68/4 in keeping with the sequela of chronic pulmonary embolus. However, no intraluminal filling defect is identified to suggest acute pulmonary embolism. The central pulmonary arteries are of normal caliber. Moderate multi-vessel coronary artery calcification. Global cardiac size is within normal limits. No pericardial effusion. Extensive atherosclerotic calcification within the thoracic aorta. No aortic aneurysm. Mediastinum/Nodes: No enlarged mediastinal, hilar, or axillary lymph nodes. Thyroid gland, trachea, and esophagus demonstrate no significant findings. Lungs/Pleura: Lungs are clear. No pneumothorax. Trace bilateral pleural effusions. No central obstructing lesion. Musculoskeletal: Surgical changes of left mastectomy and axillary lymph node dissection are identified. Stable superior endplate fracture of T3. Interval development of a remote appearing superior endplate fracture of T6 with approximately 50% loss of height. No acute bone abnormality. No lytic or blastic bone lesion. Review of the MIP images confirms the above findings. CT ABDOMEN and PELVIS FINDINGS Hepatobiliary: No focal liver abnormality is seen. No gallstones, gallbladder wall thickening, or biliary dilatation. Pancreas: Cystic lesion within the uncinate process of the pancreas is unchanged measuring up to 3.7 cm in greatest dimension and, as noted on prior examination, demonstrates slow interval growth since remote prior examination of 12/09/2017 where this measured 11 mm in greatest dimension. Given its slow interval progression, a benign etiology  such as a side branch IPMN is  considered most likely. The pancreas is otherwise unremarkable; the main pancreatic duct is not dilated. No peripancreatic inflammatory changes or fluid collections are seen. Spleen: Unremarkable Adrenals/Urinary Tract: The adrenal glands are unremarkable. The kidneys are normal. Foley catheter balloon is seen within a decompressed bladder lumen. There is superimposed circumferential bladder wall thickening and mild hyperemia noted, however, suggesting a superimposed infectious or inflammatory cystitis. Mild perivesicular inflammatory changes noted. Stomach/Bowel: There is marked hyperemia and circumferential wall thickening involving the rectosigmoid colon and rectum with mild perirectal inflammatory stranding noted in keeping with an infectious or inflammatory proctocolitis as can be seen with ulcerative colitis or pseudomembranous colitis. There is no evidence of obstruction or perforation. No free intraperitoneal gas. Trace free fluid within the pelvis. The stomach and small bowel are unremarkable. Appendix absent. Vascular/Lymphatic: Extensive aortoiliac atherosclerotic calcification with probable hemodynamically significant stenoses of the celiac axis and proximal superior and inferior mesenteric arteries as well as the renal arteries bilaterally. Calcified plaque within the infrarenal abdominal aorta may result in hemodynamically significant stenosis in this location as well. No aortic aneurysm. No pathologic adenopathy within the abdomen and pelvis. Reproductive: Status post hysterectomy. No adnexal masses. Other: No abdominal wall hernia Musculoskeletal: No acute bone abnormality. No lytic or blastic bone lesion. Degenerative changes are seen within the lumbar spine. Remote L1 and L2 compression deformities are identified. No acute bone abnormality. Review of the MIP images confirms the above findings. IMPRESSION: 1. No definite evidence of acute pulmonary embolism. Multiple thin webs within the right lower  lobar pulmonary artery in keeping with the sequela of chronic pulmonary embolus. 2. Moderate multi-vessel coronary artery calcification. 3. Trace bilateral pleural effusions. 4. Interval development of a remote appearing superior endplate fracture of T6 with approximately 50% loss of height. 5. Stable cystic lesion within the uncinate process of the pancreas measuring up to 3.7 cm in greatest dimension and, as noted on prior examination, demonstrates slow interval growth since remote prior examination of 12/09/2017 where this measured 11 mm in greatest dimension. Given its slow interval progression, a benign etiology such as a side branch IPMN is considered most likely. 6. Extensive aortoiliac atherosclerotic calcification with probable hemodynamically significant stenoses of the celiac axis, proximal superior and inferior mesenteric arteries, and renal arteries bilaterally. Calcified plaque within the infrarenal abdominal aorta may result in hemodynamically significant stenosis in this location as well. Clinical correlation for signs and symptoms of chronic mesenteric ischemia or hemodynamically significant renal artery stenosis may be helpful. 7. Infectious or inflammatory proctocolitis as can be seen with ulcerative colitis or pseudomembranous colitis. No evidence of obstruction or perforation. 8.  Aortic Atherosclerosis (ICD10-I70.0). Electronically Signed   By: Helyn Numbers M.D.   On: 04/26/2023 22:04   CT ABDOMEN PELVIS W CONTRAST  Result Date: 04/26/2023 CLINICAL DATA:  Pulmonary embolism (PE) suspected, high prob; Abdominal pain, acute, nonlocalized EXAM: CT ANGIOGRAPHY CHEST CT ABDOMEN AND PELVIS WITH CONTRAST TECHNIQUE: Multidetector CT imaging of the chest was performed using the standard protocol during bolus administration of intravenous contrast. Multiplanar CT image reconstructions and MIPs were obtained to evaluate the vascular anatomy. Multidetector CT imaging of the abdomen and pelvis was  performed using the standard protocol during bolus administration of intravenous contrast. RADIATION DOSE REDUCTION: This exam was performed according to the departmental dose-optimization program which includes automated exposure control, adjustment of the mA and/or kV according to patient size and/or use of iterative reconstruction technique. CONTRAST:  80mL OMNIPAQUE IOHEXOL  350 MG/ML SOLN COMPARISON:  CT chest 07/18/2021, CT abdomen pelvis 02/24/2023 FINDINGS: CTA CHEST FINDINGS Cardiovascular: There is adequate opacification of the pulmonary arterial tree. There are multiple thin web seen within the right lower lobar pulmonary artery best seen on image # 68/4 in keeping with the sequela of chronic pulmonary embolus. However, no intraluminal filling defect is identified to suggest acute pulmonary embolism. The central pulmonary arteries are of normal caliber. Moderate multi-vessel coronary artery calcification. Global cardiac size is within normal limits. No pericardial effusion. Extensive atherosclerotic calcification within the thoracic aorta. No aortic aneurysm. Mediastinum/Nodes: No enlarged mediastinal, hilar, or axillary lymph nodes. Thyroid gland, trachea, and esophagus demonstrate no significant findings. Lungs/Pleura: Lungs are clear. No pneumothorax. Trace bilateral pleural effusions. No central obstructing lesion. Musculoskeletal: Surgical changes of left mastectomy and axillary lymph node dissection are identified. Stable superior endplate fracture of T3. Interval development of a remote appearing superior endplate fracture of T6 with approximately 50% loss of height. No acute bone abnormality. No lytic or blastic bone lesion. Review of the MIP images confirms the above findings. CT ABDOMEN and PELVIS FINDINGS Hepatobiliary: No focal liver abnormality is seen. No gallstones, gallbladder wall thickening, or biliary dilatation. Pancreas: Cystic lesion within the uncinate process of the pancreas is  unchanged measuring up to 3.7 cm in greatest dimension and, as noted on prior examination, demonstrates slow interval growth since remote prior examination of 12/09/2017 where this measured 11 mm in greatest dimension. Given its slow interval progression, a benign etiology such as a side branch IPMN is considered most likely. The pancreas is otherwise unremarkable; the main pancreatic duct is not dilated. No peripancreatic inflammatory changes or fluid collections are seen. Spleen: Unremarkable Adrenals/Urinary Tract: The adrenal glands are unremarkable. The kidneys are normal. Foley catheter balloon is seen within a decompressed bladder lumen. There is superimposed circumferential bladder wall thickening and mild hyperemia noted, however, suggesting a superimposed infectious or inflammatory cystitis. Mild perivesicular inflammatory changes noted. Stomach/Bowel: There is marked hyperemia and circumferential wall thickening involving the rectosigmoid colon and rectum with mild perirectal inflammatory stranding noted in keeping with an infectious or inflammatory proctocolitis as can be seen with ulcerative colitis or pseudomembranous colitis. There is no evidence of obstruction or perforation. No free intraperitoneal gas. Trace free fluid within the pelvis. The stomach and small bowel are unremarkable. Appendix absent. Vascular/Lymphatic: Extensive aortoiliac atherosclerotic calcification with probable hemodynamically significant stenoses of the celiac axis and proximal superior and inferior mesenteric arteries as well as the renal arteries bilaterally. Calcified plaque within the infrarenal abdominal aorta may result in hemodynamically significant stenosis in this location as well. No aortic aneurysm. No pathologic adenopathy within the abdomen and pelvis. Reproductive: Status post hysterectomy. No adnexal masses. Other: No abdominal wall hernia Musculoskeletal: No acute bone abnormality. No lytic or blastic bone  lesion. Degenerative changes are seen within the lumbar spine. Remote L1 and L2 compression deformities are identified. No acute bone abnormality. Review of the MIP images confirms the above findings. IMPRESSION: 1. No definite evidence of acute pulmonary embolism. Multiple thin webs within the right lower lobar pulmonary artery in keeping with the sequela of chronic pulmonary embolus. 2. Moderate multi-vessel coronary artery calcification. 3. Trace bilateral pleural effusions. 4. Interval development of a remote appearing superior endplate fracture of T6 with approximately 50% loss of height. 5. Stable cystic lesion within the uncinate process of the pancreas measuring up to 3.7 cm in greatest dimension and, as noted on prior examination, demonstrates slow interval growth since  remote prior examination of 12/09/2017 where this measured 11 mm in greatest dimension. Given its slow interval progression, a benign etiology such as a side branch IPMN is considered most likely. 6. Extensive aortoiliac atherosclerotic calcification with probable hemodynamically significant stenoses of the celiac axis, proximal superior and inferior mesenteric arteries, and renal arteries bilaterally. Calcified plaque within the infrarenal abdominal aorta may result in hemodynamically significant stenosis in this location as well. Clinical correlation for signs and symptoms of chronic mesenteric ischemia or hemodynamically significant renal artery stenosis may be helpful. 7. Infectious or inflammatory proctocolitis as can be seen with ulcerative colitis or pseudomembranous colitis. No evidence of obstruction or perforation. 8.  Aortic Atherosclerosis (ICD10-I70.0). Electronically Signed   By: Helyn Numbers M.D.   On: 04/26/2023 22:04   DG Chest Portable 1 View  Result Date: 04/26/2023 CLINICAL DATA:  Syncope, fall EXAM: PORTABLE CHEST 1 VIEW COMPARISON:  10/22/2022 FINDINGS: Cardiac size is within normal limits. Thoracic aorta is  tortuous. Calcifications are seen in thoracic aorta. There are no signs of pulmonary edema or focal pulmonary consolidation. There is no pleural effusion or pneumothorax. Surgical clips are seen in left chest wall. IMPRESSION: No active cardiopulmonary disease. Electronically Signed   By: Ernie Avena M.D.   On: 04/26/2023 17:44      LOS: 1 day    Jacquelin Hawking, MD [facility] 04/27/2023, 10:36 AM   If 7PM-7AM, please contact night-coverage [email or web address]

## 2023-04-27 NOTE — Progress Notes (Signed)
ANTICOAGULATION CONSULT NOTE - Follow Up Consult  Pharmacy Consult for Heparin Indication: pulmonary embolus  Allergies  Allergen Reactions   Propranolol Other (See Comments)    Hallucinations    Gabapentin Other (See Comments)    Hallucinations   Metformin And Related Diarrhea   Codeine Nausea And Vomiting   Nitrofuran Derivatives Nausea And Vomiting and Other (See Comments)    GI Intolerance    Patient Measurements: Height: 5\' 4"  (162.6 cm) Weight: 54.4 kg (120 lb) IBW/kg (Calculated) : 54.7 Heparin Dosing Weight: 54.4 kg  Vital Signs: Temp: 98.3 F (36.8 C) (08/06 0837) Temp Source: Oral (08/06 0837) BP: 130/63 (08/06 0837) Pulse Rate: 97 (08/06 0837)  Labs: Recent Labs    04/26/23 1604 04/26/23 1835 04/27/23 0521 04/27/23 0630 04/27/23 0910  HGB 13.6  --   --  11.8*  --   HCT 40.9  --   --  35.5*  --   PLT 185  --   --  162  --   HEPARINUNFRC  --   --   --   --  <0.10*  CREATININE 0.91  --  0.68  --   --   CKTOTAL 58  --   --   --   --   TROPONINIHS 16 16  --   --   --     Estimated Creatinine Clearance: 45 mL/min (by C-G formula based on SCr of 0.68 mg/dL).   Assessment: AC/Heme: New finding of chronic PE on CT. Ddimer 3.75, Hep level <0.1. Hgb 13.6>>11.8, Plts WNL. No problems with infusion noted by ED Paramedic.  Goal of Therapy:  Heparin level 0.3-0.7 units/ml Monitor platelets by anticoagulation protocol: Yes   Plan:  - Bolus heparin 1500 units, increase infusion to 1000 units/hr - Recheck Heparin level in 8 hrs. - Daily HL and CBC   S. Merilynn Finland, PharmD, BCPS Clinical Staff Pharmacist [email or web address]  Merilynn Finland,  [place] 04/27/2023,10:46 AM

## 2023-04-27 NOTE — Progress Notes (Signed)
PT Cancellation Note  Patient Details Name: Jodi Valdez MRN: [id number] DOB: Nov 14, 1935   Cancelled Treatment:    Reason Eval/Treat Not Completed: Medical issues which prohibited therapy. Medical issues which prohibited therapy  Patient started on Heparin at 11 pm on 8/5 for PE. Therapy guidelines state that therapy is not indicated within the first 24 hours of heparin initiation.    Faye Ramsay, PT Acute Rehabilitation  Office: [phone]

## 2023-04-27 NOTE — ED Notes (Signed)
ED TO INPATIENT HANDOFF REPORT  ED Nurse Name and Phone #:  Mellody Dance  [phone]  S Name/Age/Gender Jodi Valdez 87 y.o. female Room/Bed: WA20/WA20  Code Status   Code Status: Full Code  Home/SNF/Other Home Patient oriented to: self, place, time, and situation Is this baseline? Yes   Triage Complete: Triage complete  Chief Complaint Idiopathic proctocolitis (HCC) [K51.30]  Triage Note Pt BIB [facility] from home, stating Pt fell during the night and slept in the floor bc she could not get back up. Husband stated once she got up she had diarrhea and recent C diff infection in June    Allergies Allergies  Allergen Reactions   Propranolol Other (See Comments)    Hallucinations    Gabapentin Other (See Comments)    Hallucinations   Metformin And Related Diarrhea   Codeine Nausea And Vomiting   Nitrofuran Derivatives Nausea And Vomiting and Other (See Comments)    GI Intolerance    Level of Care/Admitting Diagnosis ED Disposition     ED Disposition  Admit   Condition  --   Comment  Hospital Area: [facility] [[id number]]  Level of Care: Progressive [102]  Admit to Progressive based on following criteria: Other see comments  Admit to Progressive based on following criteria: CARDIOVASCULAR & THORACIC of moderate stability with acute coronary syndrome symptoms/low risk myocardial infarction/hypertensive urgency/arrhythmias/heart failure potentially compromising stability and stable post cardiovascular intervention patients.  Comments: Tachycardic in the setting of pancolitis andin  hypertensive crisis  May admit patient to [facility] or [facility] if equivalent level of care is available:: No  Covid Evaluation: Asymptomatic - no recent exposure (last 10 days) testing not required  Diagnosis: Idiopathic proctocolitis [facility]) [[id number]]  Admitting Physician: Tereasa Coop [[id number]]  Attending Physician: Tereasa Coop [[id number]]  Certification:: I  certify this patient will need inpatient services for at least 2 midnights  Estimated Length of Stay: 7          B Medical/Surgery History Past Medical History:  Diagnosis Date   Allergy    Anemia    Asthma    Breast cancer (HCC)    Cancer (HCC) 11/01/2000   left breast cancer   GERD (gastroesophageal reflux disease)    Heart disease    Hyperlipidemia    Hypertension    Long COVID    Personal history of chemotherapy    2002   Personal history of radiation therapy    2002   Polymyalgia [facility])    Past Surgical History:  Procedure Laterality Date   ABDOMINAL HYSTERECTOMY  1975   BREAST SURGERY Left 11/01/2000   masectomy   COLONOSCOPY  2015   CYSTOCELE REPAIR  09/1999   MASTECTOMY Left    2002 with tramflap   RECTOCELE REPAIR  09/1999     A IV Location/Drains/Wounds Patient Lines/Drains/Airways Status     Active Line/Drains/Airways     Name Placement date Placement time Site Days   Peripheral IV 04/26/23 20 G 1" Left Antecubital 04/26/23  1604  Antecubital  1   Peripheral IV 04/27/23 20 G 1" Anterior;Proximal;Right Forearm 04/27/23  0008  Forearm  less than 1   Urethral Catheter [place], NT Straight-tip 14 Fr. 04/26/23  1721  Straight-tip  1            Intake/Output Last 24 hours  Intake/Output Summary (Last 24 hours) at 04/27/2023 1020 Last data filed at 04/27/2023 0155 Gross per 24 hour  Intake 2050 ml  Output --  Net 2050 ml    Labs/Imaging Results for orders placed or performed during the hospital encounter of 04/26/23 (from the past 48 hour(s))  CBC     Status: Abnormal   Collection Time: 04/26/23  4:04 PM  Result Value Ref Range   WBC 15.8 (H) 4.0 - 10.5 K/uL   RBC 4.26 3.87 - 5.11 MIL/uL   Hemoglobin 13.6 12.0 - 15.0 g/dL   HCT 62.9 52.8 - 41.3 %   MCV 96.0 80.0 - 100.0 fL   MCH 31.9 26.0 - 34.0 pg   MCHC 33.3 30.0 - 36.0 g/dL   RDW 24.4 01.0 - 27.2 %   Platelets 185 150 - 400 K/uL   nRBC 0.0 0.0 - 0.2 %    Comment: Performed at [facility], [address], [place], [place] [address]  Comprehensive metabolic panel     Status: Abnormal   Collection Time: 04/26/23  4:04 PM  Result Value Ref Range   Sodium 134 (L) 135 - 145 mmol/L   Potassium 3.2 (L) 3.5 - 5.1 mmol/L   Chloride 103 98 - 111 mmol/L   CO2 22 22 - 32 mmol/L   Glucose, Bld 159 (H) 70 - 99 mg/dL    Comment: Glucose reference range applies only to samples taken after fasting for at least 8 hours.   BUN 18 8 - 23 mg/dL   Creatinine, Ser 4.03 0.44 - 1.00 mg/dL   Calcium 8.3 (L) 8.9 - 10.3 mg/dL   Total Protein 5.8 (L) 6.5 - 8.1 g/dL   Albumin 3.1 (L) 3.5 - 5.0 g/dL   AST 16 15 - 41 U/L   ALT 28 0 - 44 U/L   Alkaline Phosphatase 41 38 - 126 U/L   Total Bilirubin 0.7 0.3 - 1.2 mg/dL   GFR, Estimated >47 >42 mL/min    Comment: (NOTE) Calculated using the CKD-EPI Creatinine Equation (2021)    Anion gap 9 5 - 15    Comment: Performed at [facility], [address] [place], [place] [address]  CK     Status: None   Collection Time: 04/26/23  4:04 PM  Result Value Ref Range   Total CK 58 38 - 234 U/L    Comment: Performed at [facility], [address], [place], [place] [address]  Resp panel by RT-PCR (RSV, Flu A&B, Covid) Anterior Nasal Swab     Status: None   Collection Time: 04/26/23  4:04 PM   Specimen: Anterior Nasal Swab  Result Value Ref Range   SARS Coronavirus 2 by RT PCR NEGATIVE NEGATIVE    Comment: (NOTE) SARS-CoV-2 target nucleic acids are NOT DETECTED.  The SARS-CoV-2 RNA is generally detectable in upper respiratory specimens during the acute phase of infection. The lowest concentration of SARS-CoV-2 viral copies this assay can detect is 138 copies/mL. A negative result does not preclude SARS-Cov-2 infection and should not be used as the sole basis for treatment or other patient management decisions. A negative result may occur with  improper specimen collection/handling, submission  of specimen other than nasopharyngeal swab, presence of viral mutation(s) within the areas targeted by this assay, and inadequate number of viral copies(<138 copies/mL). A negative result must be combined with clinical observations, patient history, and epidemiological information. The expected result is Negative.  Fact Sheet for Patients:  BloggerCourse.com  Fact Sheet for Healthcare Providers:  SeriousBroker.it  This test is no t yet approved or cleared by the [place] FDA  and  has been authorized for detection and/or diagnosis of SARS-CoV-2 by FDA under an Emergency Use Authorization (EUA). This EUA will remain  in effect (meaning this test can be used) for the duration of the COVID-19 declaration under Section 564(b)(1) of the Act, 21 U.S.C.section 360bbb-3(b)(1), unless the authorization is terminated  or revoked sooner.       Influenza A by PCR NEGATIVE NEGATIVE   Influenza B by PCR NEGATIVE NEGATIVE    Comment: (NOTE) The Xpert Xpress SARS-CoV-2/FLU/RSV plus assay is intended as an aid in the diagnosis of influenza from Nasopharyngeal swab specimens and should not be used as a sole basis for treatment. Nasal washings and aspirates are unacceptable for Xpert Xpress SARS-CoV-2/FLU/RSV testing.  Fact Sheet for Patients: BloggerCourse.com  Fact Sheet for Healthcare Providers: SeriousBroker.it  This test is not yet approved or cleared by the [place] FDA and has been authorized for detection and/or diagnosis of SARS-CoV-2 by FDA under an Emergency Use Authorization (EUA). This EUA will remain in effect (meaning this test can be used) for the duration of the COVID-19 declaration under Section 564(b)(1) of the Act, 21 U.S.C. section 360bbb-3(b)(1), unless the authorization is terminated or revoked.     Resp Syncytial Virus by PCR NEGATIVE NEGATIVE    Comment:  (NOTE) Fact Sheet for Patients: BloggerCourse.com  Fact Sheet for Healthcare Providers: SeriousBroker.it  This test is not yet approved or cleared by the [place] FDA and has been authorized for detection and/or diagnosis of SARS-CoV-2 by FDA under an Emergency Use Authorization (EUA). This EUA will remain in effect (meaning this test can be used) for the duration of the COVID-19 declaration under Section 564(b)(1) of the Act, 21 U.S.C. section 360bbb-3(b)(1), unless the authorization is terminated or revoked.  Performed at [facility], [address] [place], [place] [address]   Troponin I (High Sensitivity)     Status: None   Collection Time: 04/26/23  4:04 PM  Result Value Ref Range   Troponin I (High Sensitivity) 16 <18 ng/L    Comment: (NOTE) Elevated high sensitivity troponin I (hsTnI) values and significant  changes across serial measurements may suggest ACS but many other  chronic and acute conditions are known to elevate hsTnI results.  Refer to the "Links" section for chest pain algorithms and additional  guidance. Performed at [facility], [address] [place], [place] [address]   Magnesium     Status: Abnormal   Collection Time: 04/26/23  4:04 PM  Result Value Ref Range   Magnesium 1.6 (L) 1.7 - 2.4 mg/dL    Comment: Performed at [facility], [address] [place], [place] [address]  C-reactive protein     Status: Abnormal   Collection Time: 04/26/23  4:04 PM  Result Value Ref Range   CRP 17.0 (H) <1.0 mg/dL    Comment: Performed at [facility], [address], [place], [place] [address]  Sedimentation rate     Status: None   Collection Time: 04/26/23  4:04 PM  Result Value Ref Range   Sed Rate 7 0 - 22 mm/hr    Comment: Performed at [facility], [address] [place], [place] [address]  TSH     Status: None    Collection Time: 04/26/23  4:04 PM  Result Value Ref Range   TSH 0.523 0.350 - 4.500 uIU/mL    Comment: Performed by a 3rd Generation assay with a functional sensitivity of <=  0.01 uIU/mL. Performed at [facility], [address] [place], [place] [address]   T4, free     Status: Abnormal   Collection Time: 04/26/23  4:04 PM  Result Value Ref Range   Free T4 1.35 (H) 0.61 - 1.12 ng/dL    Comment: (NOTE) Biotin ingestion may interfere with free T4 tests. If the results are inconsistent with the TSH level, previous test results, or the clinical presentation, then consider biotin interference. If needed, order repeat testing after stopping biotin. Performed at [facility], [address] [place], [place] [address]   I-Stat CG4 Lactic Acid     Status: None   Collection Time: 04/26/23  4:11 PM  Result Value Ref Range   Lactic Acid, Venous 1.9 0.5 - 1.9 mmol/L  Urinalysis, Routine w reflex microscopic -Urine, Clean Catch     Status: None   Collection Time: 04/26/23  5:34 PM  Result Value Ref Range   Color, Urine YELLOW YELLOW   APPearance CLEAR CLEAR   Specific Gravity, Urine 1.010 1.005 - 1.030   pH 5.0 5.0 - 8.0   Glucose, UA NEGATIVE NEGATIVE mg/dL   Hgb urine dipstick NEGATIVE NEGATIVE   Bilirubin Urine NEGATIVE NEGATIVE   Ketones, ur NEGATIVE NEGATIVE mg/dL   Protein, ur NEGATIVE NEGATIVE mg/dL   Nitrite NEGATIVE NEGATIVE   Leukocytes,Ua NEGATIVE NEGATIVE    Comment: Performed at [facility], [address], [place], [place] [address]  Troponin I (High Sensitivity)     Status: None   Collection Time: 04/26/23  6:35 PM  Result Value Ref Range   Troponin I (High Sensitivity) 16 <18 ng/L    Comment: (NOTE) Elevated high sensitivity troponin I (hsTnI) values and significant  changes across serial measurements may suggest ACS but many other  chronic and acute conditions are known to elevate hsTnI results.  Refer to the "Links"  section for chest pain algorithms and additional  guidance. Performed at [facility], [address], [place], [place] [address]   D-dimer, quantitative     Status: Abnormal   Collection Time: 04/26/23  8:45 PM  Result Value Ref Range   D-Dimer, Quant 3.76 (H) 0.00 - 0.50 ug/mL-FEU    Comment: (NOTE) At the manufacturer cut-off value of 0.5 g/mL FEU, this assay has a negative predictive value of 95-100%.This assay is intended for use in conjunction with a clinical pretest probability (PTP) assessment model to exclude pulmonary embolism (PE) and deep venous thrombosis (DVT) in outpatients suspected of PE or DVT. Results should be correlated with clinical presentation. Performed at [facility], [address] [place], [place] [address]   C Difficile Quick Screen w PCR reflex     Status: Abnormal   Collection Time: 04/27/23  1:04 AM   Specimen: STOOL  Result Value Ref Range   C Diff antigen POSITIVE (A) NEGATIVE   C Diff toxin POSITIVE (A) NEGATIVE   C Diff interpretation Toxin producing C. difficile detected.     Comment: CRITICAL RESULT CALLED TO, READ BACK BY AND VERIFIED WITH: Jerilee Hoh RN @ [phone] ON 04/27/2023 BY ABDULHALIM,M Performed at [facility], [address], [place], [place] [address]   CBG monitoring, ED     Status: Abnormal   Collection Time: 04/27/23  2:09 AM  Result Value Ref Range   Glucose-Capillary 132 (H) 70 - 99 mg/dL    Comment: Glucose reference range applies only to samples taken after fasting  for at least 8 hours.   Comment 1 Notify RN    Comment 2 Document in Chart   Comprehensive metabolic panel     Status: Abnormal   Collection Time: 04/27/23  5:21 AM  Result Value Ref Range   Sodium 133 (L) 135 - 145 mmol/L   Potassium 3.6 3.5 - 5.1 mmol/L   Chloride 106 98 - 111 mmol/L   CO2 17 (L) 22 - 32 mmol/L   Glucose, Bld 127 (H) 70 - 99 mg/dL    Comment: Glucose reference range applies only to  samples taken after fasting for at least 8 hours.   BUN 11 8 - 23 mg/dL   Creatinine, Ser 3.47 0.44 - 1.00 mg/dL   Calcium 7.3 (L) 8.9 - 10.3 mg/dL   Total Protein 4.8 (L) 6.5 - 8.1 g/dL   Albumin 2.5 (L) 3.5 - 5.0 g/dL   AST 19 15 - 41 U/L   ALT 21 0 - 44 U/L   Alkaline Phosphatase 39 38 - 126 U/L   Total Bilirubin 0.6 0.3 - 1.2 mg/dL   GFR, Estimated >42 >59 mL/min    Comment: (NOTE) Calculated using the CKD-EPI Creatinine Equation (2021)    Anion gap 10 5 - 15    Comment: Performed at [facility], [address] [place], [place] [address]  Magnesium     Status: None   Collection Time: 04/27/23  5:21 AM  Result Value Ref Range   Magnesium 2.3 1.7 - 2.4 mg/dL    Comment: Performed at [facility], [address], [place], [place] [address]  CBC with Differential/Platelet     Status: Abnormal   Collection Time: 04/27/23  6:30 AM  Result Value Ref Range   WBC 12.3 (H) 4.0 - 10.5 K/uL   RBC 3.68 (L) 3.87 - 5.11 MIL/uL   Hemoglobin 11.8 (L) 12.0 - 15.0 g/dL   HCT 29.5 (L) 18.8 - 41.6 %   MCV 96.5 80.0 - 100.0 fL   MCH 32.1 26.0 - 34.0 pg   MCHC 33.2 30.0 - 36.0 g/dL   RDW 60.6 30.1 - 60.1 %   Platelets 162 150 - 400 K/uL   nRBC 0.0 0.0 - 0.2 %   Neutrophils Relative % 74 %   Neutro Abs 9.3 (H) 1.7 - 7.7 K/uL   Lymphocytes Relative 16 %   Lymphs Abs 1.9 0.7 - 4.0 K/uL   Monocytes Relative 8 %   Monocytes Absolute 1.0 0.1 - 1.0 K/uL   Eosinophils Relative 0 %   Eosinophils Absolute 0.0 0.0 - 0.5 K/uL   Basophils Relative 1 %   Basophils Absolute 0.1 0.0 - 0.1 K/uL   Immature Granulocytes 1 %   Abs Immature Granulocytes 0.06 0.00 - 0.07 K/uL    Comment: Performed at [facility], [address], [place], [place] [address]  CBG monitoring, ED     Status: Abnormal   Collection Time: 04/27/23  7:59 AM  Result Value Ref Range   Glucose-Capillary 129 (H) 70 - 99 mg/dL    Comment: Glucose reference range applies only to  samples taken after fasting for at least 8 hours.  CBG monitoring, ED     Status: Abnormal   Collection Time: 04/27/23  9:05 AM  Result Value Ref Range   Glucose-Capillary 127 (H) 70 - 99 mg/dL    Comment: Glucose reference range applies only to samples taken after fasting for at least 8 hours.  Heparin level (  unfractionated)     Status: Abnormal   Collection Time: 04/27/23  9:10 AM  Result Value Ref Range   Heparin Unfractionated <0.10 (L) 0.30 - 0.70 IU/mL    Comment: (NOTE) The clinical reportable range upper limit is being lowered to >1.10 to align with the FDA approved guidance for the current laboratory assay.  If heparin results are below expected values, and patient dosage has  been confirmed, suggest follow up testing of antithrombin III levels. Performed at [facility], [address], [place], [place] [address]    CT Angio Chest PE W and/or Wo Contrast  Result Date: 04/26/2023 CLINICAL DATA:  Pulmonary embolism (PE) suspected, high prob; Abdominal pain, acute, nonlocalized EXAM: CT ANGIOGRAPHY CHEST CT ABDOMEN AND PELVIS WITH CONTRAST TECHNIQUE: Multidetector CT imaging of the chest was performed using the standard protocol during bolus administration of intravenous contrast. Multiplanar CT image reconstructions and MIPs were obtained to evaluate the vascular anatomy. Multidetector CT imaging of the abdomen and pelvis was performed using the standard protocol during bolus administration of intravenous contrast. RADIATION DOSE REDUCTION: This exam was performed according to the departmental dose-optimization program which includes automated exposure control, adjustment of the mA and/or kV according to patient size and/or use of iterative reconstruction technique. CONTRAST:  80mL OMNIPAQUE IOHEXOL 350 MG/ML SOLN COMPARISON:  CT chest 07/18/2021, CT abdomen pelvis 02/24/2023 FINDINGS: CTA CHEST FINDINGS Cardiovascular: There is adequate opacification of the pulmonary  arterial tree. There are multiple thin web seen within the right lower lobar pulmonary artery best seen on image # 68/4 in keeping with the sequela of chronic pulmonary embolus. However, no intraluminal filling defect is identified to suggest acute pulmonary embolism. The central pulmonary arteries are of normal caliber. Moderate multi-vessel coronary artery calcification. Global cardiac size is within normal limits. No pericardial effusion. Extensive atherosclerotic calcification within the thoracic aorta. No aortic aneurysm. Mediastinum/Nodes: No enlarged mediastinal, hilar, or axillary lymph nodes. Thyroid gland, trachea, and esophagus demonstrate no significant findings. Lungs/Pleura: Lungs are clear. No pneumothorax. Trace bilateral pleural effusions. No central obstructing lesion. Musculoskeletal: Surgical changes of left mastectomy and axillary lymph node dissection are identified. Stable superior endplate fracture of T3. Interval development of a remote appearing superior endplate fracture of T6 with approximately 50% loss of height. No acute bone abnormality. No lytic or blastic bone lesion. Review of the MIP images confirms the above findings. CT ABDOMEN and PELVIS FINDINGS Hepatobiliary: No focal liver abnormality is seen. No gallstones, gallbladder wall thickening, or biliary dilatation. Pancreas: Cystic lesion within the uncinate process of the pancreas is unchanged measuring up to 3.7 cm in greatest dimension and, as noted on prior examination, demonstrates slow interval growth since remote prior examination of 12/09/2017 where this measured 11 mm in greatest dimension. Given its slow interval progression, a benign etiology such as a side branch IPMN is considered most likely. The pancreas is otherwise unremarkable; the main pancreatic duct is not dilated. No peripancreatic inflammatory changes or fluid collections are seen. Spleen: Unremarkable Adrenals/Urinary Tract: The adrenal glands are  unremarkable. The kidneys are normal. Foley catheter balloon is seen within a decompressed bladder lumen. There is superimposed circumferential bladder wall thickening and mild hyperemia noted, however, suggesting a superimposed infectious or inflammatory cystitis. Mild perivesicular inflammatory changes noted. Stomach/Bowel: There is marked hyperemia and circumferential wall thickening involving the rectosigmoid colon and rectum with mild perirectal inflammatory stranding noted in keeping with an infectious or inflammatory proctocolitis as can be seen with ulcerative colitis or pseudomembranous colitis.  There is no evidence of obstruction or perforation. No free intraperitoneal gas. Trace free fluid within the pelvis. The stomach and small bowel are unremarkable. Appendix absent. Vascular/Lymphatic: Extensive aortoiliac atherosclerotic calcification with probable hemodynamically significant stenoses of the celiac axis and proximal superior and inferior mesenteric arteries as well as the renal arteries bilaterally. Calcified plaque within the infrarenal abdominal aorta may result in hemodynamically significant stenosis in this location as well. No aortic aneurysm. No pathologic adenopathy within the abdomen and pelvis. Reproductive: Status post hysterectomy. No adnexal masses. Other: No abdominal wall hernia Musculoskeletal: No acute bone abnormality. No lytic or blastic bone lesion. Degenerative changes are seen within the lumbar spine. Remote L1 and L2 compression deformities are identified. No acute bone abnormality. Review of the MIP images confirms the above findings. IMPRESSION: 1. No definite evidence of acute pulmonary embolism. Multiple thin webs within the right lower lobar pulmonary artery in keeping with the sequela of chronic pulmonary embolus. 2. Moderate multi-vessel coronary artery calcification. 3. Trace bilateral pleural effusions. 4. Interval development of a remote appearing superior endplate  fracture of T6 with approximately 50% loss of height. 5. Stable cystic lesion within the uncinate process of the pancreas measuring up to 3.7 cm in greatest dimension and, as noted on prior examination, demonstrates slow interval growth since remote prior examination of 12/09/2017 where this measured 11 mm in greatest dimension. Given its slow interval progression, a benign etiology such as a side branch IPMN is considered most likely. 6. Extensive aortoiliac atherosclerotic calcification with probable hemodynamically significant stenoses of the celiac axis, proximal superior and inferior mesenteric arteries, and renal arteries bilaterally. Calcified plaque within the infrarenal abdominal aorta may result in hemodynamically significant stenosis in this location as well. Clinical correlation for signs and symptoms of chronic mesenteric ischemia or hemodynamically significant renal artery stenosis may be helpful. 7. Infectious or inflammatory proctocolitis as can be seen with ulcerative colitis or pseudomembranous colitis. No evidence of obstruction or perforation. 8.  Aortic Atherosclerosis (ICD10-I70.0). Electronically Signed   By: Helyn Numbers M.D.   On: 04/26/2023 22:04   CT ABDOMEN PELVIS W CONTRAST  Result Date: 04/26/2023 CLINICAL DATA:  Pulmonary embolism (PE) suspected, high prob; Abdominal pain, acute, nonlocalized EXAM: CT ANGIOGRAPHY CHEST CT ABDOMEN AND PELVIS WITH CONTRAST TECHNIQUE: Multidetector CT imaging of the chest was performed using the standard protocol during bolus administration of intravenous contrast. Multiplanar CT image reconstructions and MIPs were obtained to evaluate the vascular anatomy. Multidetector CT imaging of the abdomen and pelvis was performed using the standard protocol during bolus administration of intravenous contrast. RADIATION DOSE REDUCTION: This exam was performed according to the departmental dose-optimization program which includes automated exposure control,  adjustment of the mA and/or kV according to patient size and/or use of iterative reconstruction technique. CONTRAST:  80mL OMNIPAQUE IOHEXOL 350 MG/ML SOLN COMPARISON:  CT chest 07/18/2021, CT abdomen pelvis 02/24/2023 FINDINGS: CTA CHEST FINDINGS Cardiovascular: There is adequate opacification of the pulmonary arterial tree. There are multiple thin web seen within the right lower lobar pulmonary artery best seen on image # 68/4 in keeping with the sequela of chronic pulmonary embolus. However, no intraluminal filling defect is identified to suggest acute pulmonary embolism. The central pulmonary arteries are of normal caliber. Moderate multi-vessel coronary artery calcification. Global cardiac size is within normal limits. No pericardial effusion. Extensive atherosclerotic calcification within the thoracic aorta. No aortic aneurysm. Mediastinum/Nodes: No enlarged mediastinal, hilar, or axillary lymph nodes. Thyroid gland, trachea, and esophagus demonstrate no significant findings. Lungs/Pleura:  Lungs are clear. No pneumothorax. Trace bilateral pleural effusions. No central obstructing lesion. Musculoskeletal: Surgical changes of left mastectomy and axillary lymph node dissection are identified. Stable superior endplate fracture of T3. Interval development of a remote appearing superior endplate fracture of T6 with approximately 50% loss of height. No acute bone abnormality. No lytic or blastic bone lesion. Review of the MIP images confirms the above findings. CT ABDOMEN and PELVIS FINDINGS Hepatobiliary: No focal liver abnormality is seen. No gallstones, gallbladder wall thickening, or biliary dilatation. Pancreas: Cystic lesion within the uncinate process of the pancreas is unchanged measuring up to 3.7 cm in greatest dimension and, as noted on prior examination, demonstrates slow interval growth since remote prior examination of 12/09/2017 where this measured 11 mm in greatest dimension. Given its slow interval  progression, a benign etiology such as a side branch IPMN is considered most likely. The pancreas is otherwise unremarkable; the main pancreatic duct is not dilated. No peripancreatic inflammatory changes or fluid collections are seen. Spleen: Unremarkable Adrenals/Urinary Tract: The adrenal glands are unremarkable. The kidneys are normal. Foley catheter balloon is seen within a decompressed bladder lumen. There is superimposed circumferential bladder wall thickening and mild hyperemia noted, however, suggesting a superimposed infectious or inflammatory cystitis. Mild perivesicular inflammatory changes noted. Stomach/Bowel: There is marked hyperemia and circumferential wall thickening involving the rectosigmoid colon and rectum with mild perirectal inflammatory stranding noted in keeping with an infectious or inflammatory proctocolitis as can be seen with ulcerative colitis or pseudomembranous colitis. There is no evidence of obstruction or perforation. No free intraperitoneal gas. Trace free fluid within the pelvis. The stomach and small bowel are unremarkable. Appendix absent. Vascular/Lymphatic: Extensive aortoiliac atherosclerotic calcification with probable hemodynamically significant stenoses of the celiac axis and proximal superior and inferior mesenteric arteries as well as the renal arteries bilaterally. Calcified plaque within the infrarenal abdominal aorta may result in hemodynamically significant stenosis in this location as well. No aortic aneurysm. No pathologic adenopathy within the abdomen and pelvis. Reproductive: Status post hysterectomy. No adnexal masses. Other: No abdominal wall hernia Musculoskeletal: No acute bone abnormality. No lytic or blastic bone lesion. Degenerative changes are seen within the lumbar spine. Remote L1 and L2 compression deformities are identified. No acute bone abnormality. Review of the MIP images confirms the above findings. IMPRESSION: 1. No definite evidence of acute  pulmonary embolism. Multiple thin webs within the right lower lobar pulmonary artery in keeping with the sequela of chronic pulmonary embolus. 2. Moderate multi-vessel coronary artery calcification. 3. Trace bilateral pleural effusions. 4. Interval development of a remote appearing superior endplate fracture of T6 with approximately 50% loss of height. 5. Stable cystic lesion within the uncinate process of the pancreas measuring up to 3.7 cm in greatest dimension and, as noted on prior examination, demonstrates slow interval growth since remote prior examination of 12/09/2017 where this measured 11 mm in greatest dimension. Given its slow interval progression, a benign etiology such as a side branch IPMN is considered most likely. 6. Extensive aortoiliac atherosclerotic calcification with probable hemodynamically significant stenoses of the celiac axis, proximal superior and inferior mesenteric arteries, and renal arteries bilaterally. Calcified plaque within the infrarenal abdominal aorta may result in hemodynamically significant stenosis in this location as well. Clinical correlation for signs and symptoms of chronic mesenteric ischemia or hemodynamically significant renal artery stenosis may be helpful. 7. Infectious or inflammatory proctocolitis as can be seen with ulcerative colitis or pseudomembranous colitis. No evidence of obstruction or perforation. 8.  Aortic Atherosclerosis (ICD10-I70.0).  Electronically Signed   By: Helyn Numbers M.D.   On: 04/26/2023 22:04   DG Chest Portable 1 View  Result Date: 04/26/2023 CLINICAL DATA:  Syncope, fall EXAM: PORTABLE CHEST 1 VIEW COMPARISON:  10/22/2022 FINDINGS: Cardiac size is within normal limits. Thoracic aorta is tortuous. Calcifications are seen in thoracic aorta. There are no signs of pulmonary edema or focal pulmonary consolidation. There is no pleural effusion or pneumothorax. Surgical clips are seen in left chest wall. IMPRESSION: No active cardiopulmonary  disease. Electronically Signed   By: Ernie Avena M.D.   On: 04/26/2023 17:44    Pending Labs Unresulted Labs (From admission, onward)     Start     Ordered   04/27/23 0500  CBC with Differential/Platelet  Daily,   R      04/26/23 2329   04/27/23 0500  Comprehensive metabolic panel  Daily,   R      04/26/23 2329   04/26/23 2328  Calprotectin, Fecal  Once,   R        04/26/23 2327   04/26/23 2259  Gastrointestinal Panel by PCR , Stool  (Gastrointestinal Panel by PCR, Stool                                                                                                                                                     **Does Not include CLOSTRIDIUM DIFFICILE testing. **If CDIFF testing is needed, place order from the "C Difficile Testing" order set.**)  Once,   URGENT       Question:  Release to patient  Answer:  Immediate   04/26/23 2258   04/26/23 2259  OVA + PARASITE EXAM  Once,   URGENT        04/26/23 2258            Vitals/Pain Today's Vitals   04/27/23 0512 04/27/23 0527 04/27/23 0631 04/27/23 0837  BP: (!) 133/58   130/63  Pulse: 97   97  Resp: (!) 22   19  Temp:   97.7 F (36.5 C) 98.3 F (36.8 C)  TempSrc:   Oral Oral  SpO2: 96%   97%  Weight:      Height:      PainSc: Asleep 0-No pain      Isolation Precautions Contact and Enteric precautions (UV disinfection)  Medications Medications  amLODipine (NORVASC) tablet 5 mg (5 mg Oral Given 04/27/23 0950)  carvedilol (COREG) tablet 6.25 mg (6.25 mg Oral Given 04/27/23 0848)  losartan (COZAAR) tablet 50 mg (50 mg Oral Given 04/27/23 0950)  heparin ADULT infusion 100 units/mL (25000 units/221mL) (850 Units/hr Intravenous New Bag/Given 04/26/23 2356)  nitroGLYCERIN (NITROSTAT) SL tablet 0.4 mg (has no administration in time range)  pravastatin (PRAVACHOL) tablet 20 mg (20 mg Oral Given 04/27/23 0951)  traZODone (DESYREL) tablet 150 mg (150 mg Oral  Given 04/26/23 2358)  levothyroxine (SYNTHROID) tablet 88 mcg (88 mcg  Oral Given 04/27/23 0522)  pantoprazole (PROTONIX) EC tablet 40 mg (40 mg Oral Given 04/27/23 0849)  sodium chloride flush (NS) 0.9 % injection 3 mL (3 mLs Intravenous Given 04/27/23 0955)  sodium chloride flush (NS) 0.9 % injection 3 mL (has no administration in time range)  0.9 %  sodium chloride infusion (has no administration in time range)  acetaminophen (TYLENOL) tablet 650 mg (has no administration in time range)    Or  acetaminophen (TYLENOL) suppository 650 mg (has no administration in time range)  ondansetron (ZOFRAN) tablet 4 mg (has no administration in time range)    Or  ondansetron (ZOFRAN) injection 4 mg (has no administration in time range)  fidaxomicin (DIFICID) tablet 200 mg (200 mg Oral Given 04/27/23 0951)  isosorbide mononitrate (IMDUR) 24 hr tablet 30 mg (30 mg Oral Given 04/27/23 0951)  hydrALAZINE (APRESOLINE) injection 10 mg (has no administration in time range)  lactated ringers infusion ( Intravenous New Bag/Given 04/27/23 0018)  cholestyramine light (PREVALITE) packet 4 g (has no administration in time range)  ipratropium-albuterol (DUONEB) 0.5-2.5 (3) MG/3ML nebulizer solution 3 mL (has no administration in time range)  cholecalciferol (VITAMIN D3) 25 MCG (1000 UNIT) tablet 5,000 Units (5,000 Units Oral Given 04/27/23 0950)  predniSONE (DELTASONE) tablet 8 mg (8 mg Oral Given 04/27/23 0848)  sodium chloride 0.9 % bolus 1,000 mL (0 mLs Intravenous Stopped 04/26/23 1841)  potassium chloride SA (KLOR-CON M) CR tablet 40 mEq (40 mEq Oral Given 04/26/23 1842)  magnesium oxide (MAG-OX) tablet 400 mg (400 mg Oral Given 04/26/23 1842)  sodium chloride 0.9 % bolus 1,000 mL (0 mLs Intravenous Stopped 04/27/23 0155)  iohexol (OMNIPAQUE) 350 MG/ML injection 100 mL (80 mLs Intravenous Contrast Given 04/26/23 2120)  heparin bolus via infusion 1,500 Units (1,500 Units Intravenous Bolus from Bag 04/26/23 2357)  magnesium sulfate IVPB 2 g 50 mL (0 g Intravenous Stopped 04/27/23 0155)    Mobility walks  with device     Focused Assessments     R Recommendations: See Admitting Provider Note  Report given to:   Additional Notes:

## 2023-04-27 NOTE — Progress Notes (Addendum)
Pharmacy: Re- heparin  Patient is an 87 y.o F with hx breast cancer and recurrent Cdiff who presented to the ED on 04/26/23 with c/o diarrhea. Chest CT on 04/26/23 showed no acute PE but had findings that were consistent with "the sequela of chronic pulmonary embolus" and proctolociltis.  She's currently on heparin drip for PE.   - heparin level collected at 7p is undetectable with rate infusing at 1000 units/hr - pt's RN reported that she loss IV access and heparin drip was off for ~15 min ([id number])   Goal of Therapy:  Heparin level 0.3-0.7 units/ml Monitor platelets by anticoagulation protocol: Yes  Plan: - heparin drip 1500 units x1 bolus, then increase drip to 1200 units/hr - check 8 hr heparin level - monitor for s/sx bleeding  Dorna Leitz, PharmD, BCPS 04/27/2023 7:54 PM

## 2023-04-27 NOTE — Consult Note (Signed)
Consultation  Referring Provider:   Dr. Caleb Popp Primary Care Physician:  Ailene Ravel, MD Primary Gastroenterologist: Dr. Chales Abrahams    Reason for Consultation: C. difficile colitis            HPI:   Jodi Valdez is a 87 y.o. female with a past medical history of breast cancer status post completion of chemo/radiation/mastectomy in 2022, hypertension, hyperlipidemia, grade 1 on diastolic heart failure with preserved EF 60-65%, Hashimoto's thyroiditis, COVID long-haul her since 2022-currently as needed DuoNeb and oxygen, polymyalgia rheumatica and multiple others, who presented to the ED with diarrhea.    6/5-6/7 hospitalization for acute colitis in the setting of recurrent C. difficile infection, CT abdomen showed pancolitis.  Patient evaluated by ID and GI recommended oral Vancomycin taper course for 6 weeks.  She completed 6 weeks taper of Vancomycin.    04/20/2023 patient contacted Dr. Chales Abrahams and was recommended she trial Cholestyramine 4 g p.o. twice daily and Imodium as needed for persistent diarrhea.    At presentation patient described 2 episodes of high-volume loose stool and generalized weakness associated with a near fall as well as rectal burning sensation and pain.    At time of my interview with the patient she is somewhat distressed feeling like things are never going to get better.  Apparently from her point of view she was doing well until about 2 days ago when she started back with diarrhea that was so copious and watery that it left her exhausted.  Tells me that she actually fell off the bed and told her husband to get her mattress to lay on the floor because she just could not move anymore.  Describes being dizzy and falling off the toilet.  She denies any abdominal pain but does have odorous watery diarrhea.  She did ask me to call her husband.  He reports that she has long-haul COVID and has some brain fog related to this making it hard for her to remember details but  essentially she has been declining over the past 2 days to the point where he brought her to the hospital.    Denies fever, chills or weight loss.  ED course: Tachycardic 123, BP 175/70, CBC with a WBC of 15.8, slightly low sodium at 134, low potassium at 3.2, low magnesium at 1.6; CTA showed multiple thin webs within the right lower lobar pulmonary artery in keeping with sequelae of chronic pulmonary embolus, moderate multivessel coronary artery calcification and extensive RDO iliac atherosclerotic calcification infectious or inflammatory proctocolitis as can be seen with ulcerative colitis or pseudomembranous colitis  GI history: 04/22/2023 stools for C. difficile are negative and patient given a trial of Cholestyramine and Imodium 01/06/2023 office visit with Dr. Chales Abrahams at that time diarrhea with lower abdominal pain, negative: 07/2014, negative CT 11/2017 as well as slowly enlarging IPMN in the end connate process without any worrisome factors-Labs drawn including CBC, CMP, CRP, CA 19-9 and CEA as well as stool studies and CTAP-patient found to have C. difficile 06/05/2020 EGD with Dr. Chales Abrahams with presbyesophagus and erosive gastritis and a single gastric polyp 08/01/2014 colonoscopy with Dr. Marina Goodell was normal  Past Medical History:  Diagnosis Date   Allergy    Anemia    Asthma    Breast cancer (HCC)    Cancer (HCC) 11/01/2000   left breast cancer   GERD (gastroesophageal reflux disease)    Heart disease    Hyperlipidemia    Hypertension    Long  COVID    Personal history of chemotherapy    2002   Personal history of radiation therapy    2002   Polymyalgia [facility])     Past Surgical History:  Procedure Laterality Date   ABDOMINAL HYSTERECTOMY  1975   BREAST SURGERY Left 11/01/2000   masectomy   COLONOSCOPY  2015   CYSTOCELE REPAIR  09/1999   MASTECTOMY Left    2002 with tramflap   RECTOCELE REPAIR  09/1999    Family History  Problem Relation Age of Onset   Colon cancer Neg Hx     Colon polyps Neg Hx    Diabetes Neg Hx    Kidney disease Neg Hx    Esophageal cancer Neg Hx     Social History   Tobacco Use   Smoking status: Never   Smokeless tobacco: Never  Vaping Use   Vaping status: Never Used  Substance Use Topics   Alcohol use: No    Alcohol/week: 0.0 standard drinks of alcohol   Drug use: No    Prior to Admission medications   Medication Sig Start Date End Date Taking? Authorizing Provider  Acetylcysteine (NAC PO) Take 1 capsule by mouth daily.    [provider]  albuterol (VENTOLIN HFA) 108 (90 Base) MCG/ACT inhaler Inhale 2 puffs into the lungs every 6 (six) hours as needed for wheezing or shortness of breath. 08/20/21   Luciano Cutter, MD  alendronate (FOSAMAX) 70 MG tablet Take 70 mg by mouth every Monday. 12/12/13   [provider]  amLODipine (NORVASC) 5 MG tablet Take 5 mg by mouth 2 (two) times daily. 10/20/22   [provider]  ascorbic acid (VITAMIN C) 1000 MG tablet Take 1,000 mg by mouth daily. 06/17/20   [provider]  budesonide (PULMICORT) 0.5 MG/2ML nebulizer solution Use one vial in nebulizer twice daily as directed. Patient taking differently: Take 0.5 mg by nebulization 2 (two) times daily as needed (for flares). 12/07/22   Kozlow, Alvira Philips, MD  carvedilol (COREG) 6.25 MG tablet Take 6.25 mg by mouth 2 (two) times daily with a meal.    [provider]  Cholecalciferol (VITAMIN D-3) 125 MCG (5000 UT) TABS Take 5,000 Units by mouth 2 (two) times daily.    [provider]  cholestyramine (QUESTRAN) 4 g packet Take 1 packet (4 g total) by mouth daily. 04/22/23   Lynann Bologna, MD  EPINEPHrine 0.3 mg/0.3 mL IJ SOAJ injection Use as directed for life-threatening allergic reaction. Patient taking differently: Inject 0.3 mg into the muscle once as needed (as directed for a life-threatening allergic reaction). 12/13/17   Kozlow, Alvira Philips, MD  furosemide (LASIX) 40 MG tablet TAKE 1 TABLET BY MOUTH  EVERY DAY 04/09/23   Georgeanna Lea, MD  isosorbide mononitrate (IMDUR) 30 MG 24 hr tablet Take 1 tablet (30 mg total) by mouth daily. 11/09/22   Georgeanna Lea, MD  levothyroxine (SYNTHROID) 88 MCG tablet Take 1 tablet (88 mcg total) by mouth daily. Patient taking differently: Take 88 mcg by mouth daily before breakfast. 10/30/22   Shamleffer, Konrad Dolores, MD  losartan (COZAAR) 50 MG tablet Take 50 mg by mouth daily.    [provider]  Multiple Vitamins-Minerals (ONE DAILY MULTIVIT-MIN ADULT PO) Take 1 tablet by mouth daily.    [provider]  Multiple Vitamins-Minerals (PRESERVISION/LUTEIN PO) Take 1 capsule by mouth 2 (two) times daily. Unknown strength    [provider]  nitroGLYCERIN (NITROSTAT) 0.4 MG SL tablet  PLACE 1 TABLET UNDER THE TONGUE EVERY 5 MINUTES AS NEEDED FOR CHEST PAIN. MAX 3 DOSES IN 15 MINUTES. IF STILL PAIN CALL 911. 04/27/23   Georgeanna Lea, MD  OXYGEN Inhale 5 L/min into the lungs as needed (for shortness of breath).    [provider]  pantoprazole (PROTONIX) 40 MG tablet Take one tablet by mouth twice daily as directed. Patient taking differently: Take 40 mg by mouth 2 (two) times daily before a meal. 04/15/22   Kozlow, Alvira Philips, MD  potassium chloride SA (KLOR-CON M) 20 MEQ tablet Take 1 tablet (20 mEq total) by mouth daily. Patient taking differently: Take 20 mEq by mouth See admin instructions. Take 20 mEq by mouth once a day only when taking Furosemide/Lasix 06/11/22   Georgeanna Lea, MD  pravastatin (PRAVACHOL) 20 MG tablet Take 20 mg by mouth daily. 04/03/21   [provider]  predniSONE (DELTASONE) 1 MG tablet Take 3 mg by mouth daily. 01/12/19   [provider]  predniSONE (DELTASONE) 5 MG tablet Take 5 mg by mouth daily. 03/16/22   [provider]  Spacer/Aero-Holding Rudean Curt As directed Patient taking differently: 1 each by Other route See admin instructions. As directed 04/08/21    Luciano Cutter, MD  traZODone (DESYREL) 100 MG tablet Take 150 mg by mouth at bedtime. 05/18/20   [provider]    Current Facility-Administered Medications  Medication Dose Route Frequency Provider Last Rate Last Admin   0.9 %  sodium chloride infusion  250 mL Intravenous PRN Janalyn Shy, Subrina, MD       acetaminophen (TYLENOL) tablet 650 mg  650 mg Oral Q6H PRN Janalyn Shy, Subrina, MD       Or   acetaminophen (TYLENOL) suppository 650 mg  650 mg Rectal Q6H PRN Janalyn Shy, Subrina, MD       amLODipine (NORVASC) tablet 5 mg  5 mg Oral BID Sundil, Subrina, MD   5 mg at 04/26/23 2358   carvedilol (COREG) tablet 6.25 mg  6.25 mg Oral BID WC Sundil, Subrina, MD   6.25 mg at 04/26/23 2358   cholecalciferol (VITAMIN D3) 25 MCG (1000 UNIT) tablet 5,000 Units  5,000 Units Oral BID Sundil, Subrina, MD       cholestyramine light (PREVALITE) packet 4 g  4 g Oral BID AC Sundil, Subrina, MD       fidaxomicin (DIFICID) tablet 200 mg  200 mg Oral BID Sundil, Subrina, MD   200 mg at 04/26/23 2358   heparin ADULT infusion 100 units/mL (25000 units/280mL)  850 Units/hr Intravenous Continuous Sundil, Subrina, MD 8.5 mL/hr at 04/26/23 2356 850 Units/hr at 04/26/23 2356   hydrALAZINE (APRESOLINE) injection 10 mg  10 mg Intravenous Q8H PRN Sundil, Subrina, MD       ipratropium-albuterol (DUONEB) 0.5-2.5 (3) MG/3ML nebulizer solution 3 mL  3 mL Nebulization Q6H PRN Janalyn Shy, Subrina, MD       isosorbide mononitrate (IMDUR) 24 hr tablet 30 mg  30 mg Oral Daily Sundil, Subrina, MD       lactated ringers infusion   Intravenous Continuous Sundil, Subrina, MD 75 mL/hr at 04/27/23 0018 New Bag at 04/27/23 0018   levothyroxine (SYNTHROID) tablet 88 mcg  88 mcg Oral Q0600 Tereasa Coop, MD   88 mcg at 04/27/23 0522   losartan (COZAAR) tablet 50 mg  50 mg Oral Daily Sundil, Subrina, MD       nitroGLYCERIN (NITROSTAT) SL tablet 0.4 mg  0.4 mg Sublingual Q5 min PRN Sundil,  Subrina, MD       ondansetron [facility]) tablet 4 mg   4 mg Oral Q6H PRN Janalyn Shy, Subrina, MD       Or   ondansetron [facility]) injection 4 mg  4 mg Intravenous Q6H PRN Janalyn Shy, Subrina, MD       pantoprazole (PROTONIX) EC tablet 40 mg  40 mg Oral BID AC Sundil, Subrina, MD       pravastatin (PRAVACHOL) tablet 20 mg  20 mg Oral Daily Sundil, Subrina, MD       predniSONE (DELTASONE) tablet 8 mg  8 mg Oral Q breakfast Sundil, Subrina, MD       sodium chloride flush (NS) 0.9 % injection 3 mL  3 mL Intravenous Q12H Sundil, Subrina, MD   3 mL at 04/26/23 2358   sodium chloride flush (NS) 0.9 % injection 3 mL  3 mL Intravenous PRN Janalyn Shy, Subrina, MD       traZODone (DESYREL) tablet 150 mg  150 mg Oral QHS Sundil, Subrina, MD   150 mg at 04/26/23 2358   Current Outpatient Medications  Medication Sig Dispense Refill   Acetylcysteine (NAC PO) Take 1 capsule by mouth daily.     albuterol (VENTOLIN HFA) 108 (90 Base) MCG/ACT inhaler Inhale 2 puffs into the lungs every 6 (six) hours as needed for wheezing or shortness of breath. 8 g 2   alendronate (FOSAMAX) 70 MG tablet Take 70 mg by mouth every Monday.     amLODipine (NORVASC) 5 MG tablet Take 5 mg by mouth 2 (two) times daily.     ascorbic acid (VITAMIN C) 1000 MG tablet Take 1,000 mg by mouth daily.     budesonide (PULMICORT) 0.5 MG/2ML nebulizer solution Use one vial in nebulizer twice daily as directed. (Patient taking differently: Take 0.5 mg by nebulization 2 (two) times daily as needed (for flares).) 120 mL 5   carvedilol (COREG) 6.25 MG tablet Take 6.25 mg by mouth 2 (two) times daily with a meal.     Cholecalciferol (VITAMIN D-3) 125 MCG (5000 UT) TABS Take 5,000 Units by mouth 2 (two) times daily.     cholestyramine (QUESTRAN) 4 g packet Take 1 packet (4 g total) by mouth daily. 30 packet 6   EPINEPHrine 0.3 mg/0.3 mL IJ SOAJ injection Use as directed for life-threatening allergic reaction. (Patient taking differently: Inject 0.3 mg into the muscle once as needed (as directed for a life-threatening  allergic reaction).) 2 Device 3   furosemide (LASIX) 40 MG tablet TAKE 1 TABLET BY MOUTH EVERY DAY 90 tablet 1   isosorbide mononitrate (IMDUR) 30 MG 24 hr tablet Take 1 tablet (30 mg total) by mouth daily. 90 tablet 3   levothyroxine (SYNTHROID) 88 MCG tablet Take 1 tablet (88 mcg total) by mouth daily. (Patient taking differently: Take 88 mcg by mouth daily before breakfast.) 90 tablet 3   losartan (COZAAR) 50 MG tablet Take 50 mg by mouth daily.     Multiple Vitamins-Minerals (ONE DAILY MULTIVIT-MIN ADULT PO) Take 1 tablet by mouth daily.     Multiple Vitamins-Minerals (PRESERVISION/LUTEIN PO) Take 1 capsule by mouth 2 (two) times daily. Unknown strength     nitroGLYCERIN (NITROSTAT) 0.4 MG SL tablet PLACE 1 TABLET UNDER THE TONGUE EVERY 5 MINUTES AS NEEDED FOR CHEST PAIN. MAX 3 DOSES IN 15 MINUTES. IF STILL PAIN CALL 911. 25 tablet 3   OXYGEN Inhale 5 L/min into the lungs as needed (for shortness of breath).     pantoprazole (PROTONIX) 40  MG tablet Take one tablet by mouth twice daily as directed. (Patient taking differently: Take 40 mg by mouth 2 (two) times daily before a meal.) 180 tablet 1   potassium chloride SA (KLOR-CON M) 20 MEQ tablet Take 1 tablet (20 mEq total) by mouth daily. (Patient taking differently: Take 20 mEq by mouth See admin instructions. Take 20 mEq by mouth once a day only when taking Furosemide/Lasix) 90 tablet 3   pravastatin (PRAVACHOL) 20 MG tablet Take 20 mg by mouth daily.     predniSONE (DELTASONE) 1 MG tablet Take 3 mg by mouth daily.     predniSONE (DELTASONE) 5 MG tablet Take 5 mg by mouth daily.     Spacer/Aero-Holding Rudean Curt As directed (Patient taking differently: 1 each by Other route See admin instructions. As directed) 1 each 0   traZODone (DESYREL) 100 MG tablet Take 150 mg by mouth at bedtime.      Allergies as of 04/26/2023 - Review Complete 04/26/2023  Allergen Reaction Noted   Propranolol Other (See Comments) 05/15/2014   Gabapentin Other  (See Comments) 04/15/2022   Metformin and related Diarrhea 02/24/2023   Codeine Nausea And Vomiting    Nitrofuran derivatives Nausea And Vomiting and Other (See Comments) 07/24/2014     Review of Systems:    Constitutional: No weight loss, fever or chills +fatigue Skin: No rash  Cardiovascular: No chest pain Respiratory: No SOB  Gastrointestinal: See HPI and otherwise negative Genitourinary: No dysuria  Neurological: +dizziness and syncope Musculoskeletal: No new muscle or joint pain Hematologic: No bleeding or bruising Psychiatric: No history of depression or anxiety    Physical Exam:  Vital signs in last 24 hours: Temp:  [97.7 F (36.5 C)-98.3 F (36.8 C)] 98.3 F (36.8 C) (08/06 0837) Pulse Rate:  [97-131] 97 (08/06 0837) Resp:  [16-29] 19 (08/06 0837) BP: (130-194)/(52-79) 130/63 (08/06 0837) SpO2:  [92 %-98 %] 97 % (08/06 0837) Weight:  [54.4 kg] 54.4 kg (08/05 1531)   General:   Pleasant elderly chronically ill-appearing Caucasian female appears to be in NAD, Well developed, Well nourished, alert and cooperative Head:  Normocephalic and atraumatic. Eyes:   PEERL, EOMI. No icterus. Conjunctiva pink. Ears:  Normal auditory acuity. Neck:  Supple Throat: Oral cavity and pharynx without inflammation, swelling or lesion.  Lungs: Respirations even and unlabored. Lungs clear to auscultation bilaterally.   No wheezes, crackles, or rhonchi.  Heart: Normal S1, S2. No MRG. Regular rate and rhythm. No peripheral edema, cyanosis or pallor.  Abdomen:  Soft, nondistended, nontender. No rebound or guarding.  Increased bowel sounds all 4 quadrants. No appreciable masses or hepatomegaly. Rectal:  Not performed.  Msk:  Symmetrical without gross deformities. Peripheral pulses intact.  Extremities:  Without edema, no deformity or joint abnormality.  Neurologic:  Alert and  oriented x4;  grossly normal neurologically.  Skin: No significant lesions or rashes. + Dry and flaking  lips Psychiatric: Demonstrates good judgement and reason without abnormal affect or behaviors.   LAB RESULTS: Recent Labs    04/26/23 1604 04/27/23 0630  WBC 15.8* 12.3*  HGB 13.6 11.8*  HCT 40.9 35.5*  PLT 185 162   BMET Recent Labs    04/26/23 1604 04/27/23 0521  NA 134* 133*  K 3.2* 3.6  CL 103 106  CO2 22 17*  GLUCOSE 159* 127*  BUN 18 11  CREATININE 0.91 0.68  CALCIUM 8.3* 7.3*   LFT Recent Labs    04/27/23 0521  PROT 4.8*  ALBUMIN 2.5*  AST 19  ALT 21  ALKPHOS 39  BILITOT 0.6   STUDIES: CT Angio Chest PE W and/or Wo Contrast  Result Date: 04/26/2023 CLINICAL DATA:  Pulmonary embolism (PE) suspected, high prob; Abdominal pain, acute, nonlocalized EXAM: CT ANGIOGRAPHY CHEST CT ABDOMEN AND PELVIS WITH CONTRAST TECHNIQUE: Multidetector CT imaging of the chest was performed using the standard protocol during bolus administration of intravenous contrast. Multiplanar CT image reconstructions and MIPs were obtained to evaluate the vascular anatomy. Multidetector CT imaging of the abdomen and pelvis was performed using the standard protocol during bolus administration of intravenous contrast. RADIATION DOSE REDUCTION: This exam was performed according to the departmental dose-optimization program which includes automated exposure control, adjustment of the mA and/or kV according to patient size and/or use of iterative reconstruction technique. CONTRAST:  80mL OMNIPAQUE IOHEXOL 350 MG/ML SOLN COMPARISON:  CT chest 07/18/2021, CT abdomen pelvis 02/24/2023 FINDINGS: CTA CHEST FINDINGS Cardiovascular: There is adequate opacification of the pulmonary arterial tree. There are multiple thin web seen within the right lower lobar pulmonary artery best seen on image # 68/4 in keeping with the sequela of chronic pulmonary embolus. However, no intraluminal filling defect is identified to suggest acute pulmonary embolism. The central pulmonary arteries are of normal caliber. Moderate  multi-vessel coronary artery calcification. Global cardiac size is within normal limits. No pericardial effusion. Extensive atherosclerotic calcification within the thoracic aorta. No aortic aneurysm. Mediastinum/Nodes: No enlarged mediastinal, hilar, or axillary lymph nodes. Thyroid gland, trachea, and esophagus demonstrate no significant findings. Lungs/Pleura: Lungs are clear. No pneumothorax. Trace bilateral pleural effusions. No central obstructing lesion. Musculoskeletal: Surgical changes of left mastectomy and axillary lymph node dissection are identified. Stable superior endplate fracture of T3. Interval development of a remote appearing superior endplate fracture of T6 with approximately 50% loss of height. No acute bone abnormality. No lytic or blastic bone lesion. Review of the MIP images confirms the above findings. CT ABDOMEN and PELVIS FINDINGS Hepatobiliary: No focal liver abnormality is seen. No gallstones, gallbladder wall thickening, or biliary dilatation. Pancreas: Cystic lesion within the uncinate process of the pancreas is unchanged measuring up to 3.7 cm in greatest dimension and, as noted on prior examination, demonstrates slow interval growth since remote prior examination of 12/09/2017 where this measured 11 mm in greatest dimension. Given its slow interval progression, a benign etiology such as a side branch IPMN is considered most likely. The pancreas is otherwise unremarkable; the main pancreatic duct is not dilated. No peripancreatic inflammatory changes or fluid collections are seen. Spleen: Unremarkable Adrenals/Urinary Tract: The adrenal glands are unremarkable. The kidneys are normal. Foley catheter balloon is seen within a decompressed bladder lumen. There is superimposed circumferential bladder wall thickening and mild hyperemia noted, however, suggesting a superimposed infectious or inflammatory cystitis. Mild perivesicular inflammatory changes noted. Stomach/Bowel: There is  marked hyperemia and circumferential wall thickening involving the rectosigmoid colon and rectum with mild perirectal inflammatory stranding noted in keeping with an infectious or inflammatory proctocolitis as can be seen with ulcerative colitis or pseudomembranous colitis. There is no evidence of obstruction or perforation. No free intraperitoneal gas. Trace free fluid within the pelvis. The stomach and small bowel are unremarkable. Appendix absent. Vascular/Lymphatic: Extensive aortoiliac atherosclerotic calcification with probable hemodynamically significant stenoses of the celiac axis and proximal superior and inferior mesenteric arteries as well as the renal arteries bilaterally. Calcified plaque within the infrarenal abdominal aorta may result in hemodynamically significant stenosis in this location as well. No aortic aneurysm. No pathologic adenopathy within the abdomen and  pelvis. Reproductive: Status post hysterectomy. No adnexal masses. Other: No abdominal wall hernia Musculoskeletal: No acute bone abnormality. No lytic or blastic bone lesion. Degenerative changes are seen within the lumbar spine. Remote L1 and L2 compression deformities are identified. No acute bone abnormality. Review of the MIP images confirms the above findings. IMPRESSION: 1. No definite evidence of acute pulmonary embolism. Multiple thin webs within the right lower lobar pulmonary artery in keeping with the sequela of chronic pulmonary embolus. 2. Moderate multi-vessel coronary artery calcification. 3. Trace bilateral pleural effusions. 4. Interval development of a remote appearing superior endplate fracture of T6 with approximately 50% loss of height. 5. Stable cystic lesion within the uncinate process of the pancreas measuring up to 3.7 cm in greatest dimension and, as noted on prior examination, demonstrates slow interval growth since remote prior examination of 12/09/2017 where this measured 11 mm in greatest dimension. Given its  slow interval progression, a benign etiology such as a side branch IPMN is considered most likely. 6. Extensive aortoiliac atherosclerotic calcification with probable hemodynamically significant stenoses of the celiac axis, proximal superior and inferior mesenteric arteries, and renal arteries bilaterally. Calcified plaque within the infrarenal abdominal aorta may result in hemodynamically significant stenosis in this location as well. Clinical correlation for signs and symptoms of chronic mesenteric ischemia or hemodynamically significant renal artery stenosis may be helpful. 7. Infectious or inflammatory proctocolitis as can be seen with ulcerative colitis or pseudomembranous colitis. No evidence of obstruction or perforation. 8.  Aortic Atherosclerosis (ICD10-I70.0). Electronically Signed   By: Helyn Numbers M.D.   On: 04/26/2023 22:04   CT ABDOMEN PELVIS W CONTRAST  Result Date: 04/26/2023 CLINICAL DATA:  Pulmonary embolism (PE) suspected, high prob; Abdominal pain, acute, nonlocalized EXAM: CT ANGIOGRAPHY CHEST CT ABDOMEN AND PELVIS WITH CONTRAST TECHNIQUE: Multidetector CT imaging of the chest was performed using the standard protocol during bolus administration of intravenous contrast. Multiplanar CT image reconstructions and MIPs were obtained to evaluate the vascular anatomy. Multidetector CT imaging of the abdomen and pelvis was performed using the standard protocol during bolus administration of intravenous contrast. RADIATION DOSE REDUCTION: This exam was performed according to the departmental dose-optimization program which includes automated exposure control, adjustment of the mA and/or kV according to patient size and/or use of iterative reconstruction technique. CONTRAST:  80mL OMNIPAQUE IOHEXOL 350 MG/ML SOLN COMPARISON:  CT chest 07/18/2021, CT abdomen pelvis 02/24/2023 FINDINGS: CTA CHEST FINDINGS Cardiovascular: There is adequate opacification of the pulmonary arterial tree. There are  multiple thin web seen within the right lower lobar pulmonary artery best seen on image # 68/4 in keeping with the sequela of chronic pulmonary embolus. However, no intraluminal filling defect is identified to suggest acute pulmonary embolism. The central pulmonary arteries are of normal caliber. Moderate multi-vessel coronary artery calcification. Global cardiac size is within normal limits. No pericardial effusion. Extensive atherosclerotic calcification within the thoracic aorta. No aortic aneurysm. Mediastinum/Nodes: No enlarged mediastinal, hilar, or axillary lymph nodes. Thyroid gland, trachea, and esophagus demonstrate no significant findings. Lungs/Pleura: Lungs are clear. No pneumothorax. Trace bilateral pleural effusions. No central obstructing lesion. Musculoskeletal: Surgical changes of left mastectomy and axillary lymph node dissection are identified. Stable superior endplate fracture of T3. Interval development of a remote appearing superior endplate fracture of T6 with approximately 50% loss of height. No acute bone abnormality. No lytic or blastic bone lesion. Review of the MIP images confirms the above findings. CT ABDOMEN and PELVIS FINDINGS Hepatobiliary: No focal liver abnormality is seen. No gallstones, gallbladder  wall thickening, or biliary dilatation. Pancreas: Cystic lesion within the uncinate process of the pancreas is unchanged measuring up to 3.7 cm in greatest dimension and, as noted on prior examination, demonstrates slow interval growth since remote prior examination of 12/09/2017 where this measured 11 mm in greatest dimension. Given its slow interval progression, a benign etiology such as a side branch IPMN is considered most likely. The pancreas is otherwise unremarkable; the main pancreatic duct is not dilated. No peripancreatic inflammatory changes or fluid collections are seen. Spleen: Unremarkable Adrenals/Urinary Tract: The adrenal glands are unremarkable. The kidneys are  normal. Foley catheter balloon is seen within a decompressed bladder lumen. There is superimposed circumferential bladder wall thickening and mild hyperemia noted, however, suggesting a superimposed infectious or inflammatory cystitis. Mild perivesicular inflammatory changes noted. Stomach/Bowel: There is marked hyperemia and circumferential wall thickening involving the rectosigmoid colon and rectum with mild perirectal inflammatory stranding noted in keeping with an infectious or inflammatory proctocolitis as can be seen with ulcerative colitis or pseudomembranous colitis. There is no evidence of obstruction or perforation. No free intraperitoneal gas. Trace free fluid within the pelvis. The stomach and small bowel are unremarkable. Appendix absent. Vascular/Lymphatic: Extensive aortoiliac atherosclerotic calcification with probable hemodynamically significant stenoses of the celiac axis and proximal superior and inferior mesenteric arteries as well as the renal arteries bilaterally. Calcified plaque within the infrarenal abdominal aorta may result in hemodynamically significant stenosis in this location as well. No aortic aneurysm. No pathologic adenopathy within the abdomen and pelvis. Reproductive: Status post hysterectomy. No adnexal masses. Other: No abdominal wall hernia Musculoskeletal: No acute bone abnormality. No lytic or blastic bone lesion. Degenerative changes are seen within the lumbar spine. Remote L1 and L2 compression deformities are identified. No acute bone abnormality. Review of the MIP images confirms the above findings. IMPRESSION: 1. No definite evidence of acute pulmonary embolism. Multiple thin webs within the right lower lobar pulmonary artery in keeping with the sequela of chronic pulmonary embolus. 2. Moderate multi-vessel coronary artery calcification. 3. Trace bilateral pleural effusions. 4. Interval development of a remote appearing superior endplate fracture of T6 with approximately  50% loss of height. 5. Stable cystic lesion within the uncinate process of the pancreas measuring up to 3.7 cm in greatest dimension and, as noted on prior examination, demonstrates slow interval growth since remote prior examination of 12/09/2017 where this measured 11 mm in greatest dimension. Given its slow interval progression, a benign etiology such as a side branch IPMN is considered most likely. 6. Extensive aortoiliac atherosclerotic calcification with probable hemodynamically significant stenoses of the celiac axis, proximal superior and inferior mesenteric arteries, and renal arteries bilaterally. Calcified plaque within the infrarenal abdominal aorta may result in hemodynamically significant stenosis in this location as well. Clinical correlation for signs and symptoms of chronic mesenteric ischemia or hemodynamically significant renal artery stenosis may be helpful. 7. Infectious or inflammatory proctocolitis as can be seen with ulcerative colitis or pseudomembranous colitis. No evidence of obstruction or perforation. 8.  Aortic Atherosclerosis (ICD10-I70.0). Electronically Signed   By: Helyn Numbers M.D.   On: 04/26/2023 22:04   DG Chest Portable 1 View  Result Date: 04/26/2023 CLINICAL DATA:  Syncope, fall EXAM: PORTABLE CHEST 1 VIEW COMPARISON:  10/22/2022 FINDINGS: Cardiac size is within normal limits. Thoracic aorta is tortuous. Calcifications are seen in thoracic aorta. There are no signs of pulmonary edema or focal pulmonary consolidation. There is no pleural effusion or pneumothorax. Surgical clips are seen in left chest wall. IMPRESSION: No  active cardiopulmonary disease. Electronically Signed   By: Ernie Avena M.D.   On: 04/26/2023 17:44      Impression / Plan:   Impression: 1.  C. difficile proctocolitis: History of recent C. difficile colitis on Vancomycin taper started around 02/24/2023 for 6 weeks, continued with diarrhea, rechecked for C. difficile 04/19/2023 which was  negative-started on Cholestyramine 4 g twice daily and Imodium, continued with diarrhea,C. difficile toxin panel positive for toxin and antigen; likely ongoing C. difficile 2.  New incidental finding of pulmonary embolism: Patient started on IV heparin drip 3.  Grade 1 diastolic heart failure with preserved EF 4.  Hypertensive crisis: initially in the ER, vitals have improved now after treatment 5.  COVID-19 long-hauler with shortness of breath: Also on oxygen as needed 6.  Osteoporosis 7.  GERD: Currently on Protonix 40 mg twice daily 8.  History of left breast cancer: status post chemo/radiation/mastectomy in 2022 9.  Polymyalgia rheumatica: Currently on Prednisone 8 mg daily 10.  Pancreatic insufficiency and pancreatic cyst: Patient continues on Cholestyramine 4 g twice daily before meals, CT abdomen shows known pancreatic cyst slowly enlarging in size suspicious for intraductal papillary mucinous  Plan: 1.  Agree with Dificid 200 mg twice daily for 10 days 2.  If patient is not improved with above measures then may need to consider fecal transplant 3.  Did call the patient's husband per her request and asked him to bring her dentures.  Also discussed treatment plan for now.  He verbalized understanding and I answered his questions. 4.  Continue other supportive measures including fluids  Thank you for your kind consultation, we will continue to follow.  Violet Baldy   04/27/2023, 8:45 AM

## 2023-04-28 ENCOUNTER — Other Ambulatory Visit (HOSPITAL_COMMUNITY): Payer: Self-pay

## 2023-04-28 DIAGNOSIS — K529 Noninfective gastroenteritis and colitis, unspecified: Secondary | ICD-10-CM | POA: Diagnosis not present

## 2023-04-28 DIAGNOSIS — R6510 Systemic inflammatory response syndrome (SIRS) of non-infectious origin without acute organ dysfunction: Secondary | ICD-10-CM | POA: Diagnosis not present

## 2023-04-28 DIAGNOSIS — A09 Infectious gastroenteritis and colitis, unspecified: Secondary | ICD-10-CM

## 2023-04-28 DIAGNOSIS — R933 Abnormal findings on diagnostic imaging of other parts of digestive tract: Secondary | ICD-10-CM

## 2023-04-28 DIAGNOSIS — I2699 Other pulmonary embolism without acute cor pulmonale: Secondary | ICD-10-CM | POA: Diagnosis not present

## 2023-04-28 DIAGNOSIS — E876 Hypokalemia: Secondary | ICD-10-CM | POA: Diagnosis not present

## 2023-04-28 DIAGNOSIS — A0471 Enterocolitis due to Clostridium difficile, recurrent: Secondary | ICD-10-CM | POA: Diagnosis not present

## 2023-04-28 LAB — HEPARIN LEVEL (UNFRACTIONATED)
Heparin Unfractionated: 0.84 IU/mL — ABNORMAL HIGH (ref 0.30–0.70)
Heparin Unfractionated: <0.1 IU/mL — ABNORMAL LOW (ref 0.30–0.70)

## 2023-04-28 MED ORDER — VANCOMYCIN HCL 125 MG PO CAPS: 125.0000 mg | ORAL_CAPSULE | ORAL | Status: DC

## 2023-04-28 MED ORDER — VANCOMYCIN HCL 125 MG PO CAPS: 125.0000 mg | ORAL_CAPSULE | Freq: Every day | ORAL | Status: DC

## 2023-04-28 MED ORDER — VANCOMYCIN HCL 125 MG PO CAPS: 125.0000 mg | ORAL_CAPSULE | Freq: Two times a day (BID) | ORAL | Status: DC

## 2023-04-28 MED ORDER — POTASSIUM CHLORIDE CRYS ER 20 MEQ PO TBCR
40.0000 meq | EXTENDED_RELEASE_TABLET | Freq: Two times a day (BID) | ORAL | Status: AC
  Administered 2023-04-28 (×2): 40 meq via ORAL
  Filled 2023-04-28 (×2): qty 2

## 2023-04-28 MED ORDER — VANCOMYCIN HCL 125 MG PO CAPS
125.0000 mg | ORAL_CAPSULE | Freq: Four times a day (QID) | ORAL | Status: DC
  Administered 2023-04-28: 125 mg via ORAL
  Filled 2023-04-28 (×6): qty 1

## 2023-04-28 NOTE — TOC Benefit Eligibility Note (Signed)
Patient Product/process development scientist completed.    The patient is insured through [employer]. Patient has Medicare and is not eligible for a copay card, but may be able to apply for patient assistance, if available.    Ran test claim for Eliquis Starter pack and the current 30 day co-pay is $47.00.  Ran test claim for Xarelto Starter pack and the current 30 day co-pay is $47.00.  This test claim was processed through [facility]- copay amounts may vary at other pharmacies due to pharmacy/plan contracts, or as the patient moves through the different stages of their insurance plan.     Roland Earl, CPHT Pharmacy Patient Advocate Specialist [facility] Patient Advocate Team Direct Number: [phone]  Fax: [phone]

## 2023-04-28 NOTE — Progress Notes (Signed)
[facility] PROGRESS NOTE    Progress Note  Jodi Valdez  UJW:[id number] DOB: March 18, 1936 DOA: 04/26/2023 PCP: Ailene Ravel, MD     Brief Narrative:   Jodi Valdez is an 87 y.o. female past medical history breast cancer status post chemotherapy radiation and meniscectomy, chronic diastolic heart failure, Hashimoto disease long COVID syndrome, polymyalgia rheumatica presented secondary to diarrhea weakness and near syncope.  Found to have C. difficile colitis   Assessment/Plan:   Infectious proctocolitis due to recurrent C. difficile colitis: CT scan of the abdomen pelvis showed inflammatory process of the colon [facility] was consulted. Blood cultures negative on admission. Started on Dificid bowel movement have not slowed down. Will consult infectious disease might need of vancomycin taper which is better for creatinine C. difficile colitis  Sepsis: Likely due to C. difficile colitis, blood cultures on admission were negative now resolved.  Chronic pulmonary embolism: No evidence of PE on CTA, CTA Right lower lobe pulmonary artery consistent with chronic pulmonary embolism. Can change to a DOAC.  Essential hypertension Continue Coreg Imdur losartan amlodipine blood pressure is well-controlled.  Chronic diastolic heart failure: Currently stable continue losartan and Coreg.  Hypovolemic hyponatremia: Resolved with IV fluids.  Hypokalemia: Likely due to diarrhea repleted now resolved.  Try to give potassium greater than 4.  Hypomagnesemia: Repleted now improved.  History of fall: PT OT has been consulted awaiting evaluation.  Hashimoto thyroiditis: Continue Synthroid.  Osteoporosis: Continue current home medications.  GERD: Continue Protonix.  Hyperlipidemia: Continue statins.  Insomnia: Continue trazodone.  History of left breast cancer on Noted.  Polymyalgia rheumatica: Continue steroids.  Pancreatic  insufficiency: Continue cholestyramine.    DVT prophylaxis: lovenox Family Communication:none Status is: Inpatient Remains inpatient appropriate because: Recurrent C. difficile colitis    Code Status:     Code Status Orders  (From admission, onward)           Start     Ordered   04/26/23 2317  Full code  Continuous       Question:  By:  Answer:  Consent: discussion documented in EHR   04/26/23 2319           Code Status History     Date Active Date Inactive Code Status Order ID Comments User Context   02/24/2023 1628 02/26/2023 1947 Full Code [id number]  Lorin Glass, MD ED         IV Access:   Peripheral IV   Procedures and diagnostic studies:   ECHOCARDIOGRAM LIMITED  Result Date: 04/27/2023    ECHOCARDIOGRAM LIMITED REPORT   Patient Name:   Jodi Valdez Date of Exam: 04/27/2023 Medical Rec #:  [id number]             Height:       64.0 in Accession #:    [id number]            Weight:       120.0 lb Date of Birth:  December 09, 1935            BSA:          1.575 m Patient Age:    86 years              BP:           105/92 mmHg Patient Gender: F                     HR:  84 bpm. Exam Location:  Inpatient Procedure: Limited Echo, Cardiac Doppler and Color Doppler Indications:    I26.02 Pulmonary embolus  History:        Patient has prior history of Echocardiogram examinations, most                 recent 11/23/2022. Risk Factors:Hypertension.  Sonographer:    Darlys Gales Referring Phys: [id number] SUBRINA SUNDIL IMPRESSIONS  1. Left ventricular ejection fraction, by estimation, is 60 to 65%. The left ventricle has normal function. The left ventricle has no regional wall motion abnormalities. Left ventricular diastolic parameters are consistent with Grade I diastolic dysfunction (impaired relaxation).  2. Right ventricular systolic function is normal. The right ventricular size is normal.  3. The mitral valve is normal in structure. No evidence of mitral valve  regurgitation. Mild mitral stenosis. The mean mitral valve gradient is 5.7 mmHg. Moderate mitral annular calcification.  4. The aortic valve is tricuspid. There is mild calcification of the aortic valve. There is mild thickening of the aortic valve. Aortic valve regurgitation is not visualized. No aortic stenosis is present.  5. The inferior vena cava is dilated in size with >50% respiratory variability, suggesting right atrial pressure of 8 mmHg. FINDINGS  Left Ventricle: Left ventricular ejection fraction, by estimation, is 60 to 65%. The left ventricle has normal function. The left ventricle has no regional wall motion abnormalities. The left ventricular internal cavity size was normal in size. There is  no left ventricular hypertrophy. Left ventricular diastolic parameters are consistent with Grade I diastolic dysfunction (impaired relaxation). Right Ventricle: The right ventricular size is normal. No increase in right ventricular wall thickness. Right ventricular systolic function is normal. Left Atrium: Left atrial size was normal in size. Right Atrium: Right atrial size was normal in size. Pericardium: There is no evidence of pericardial effusion. Mitral Valve: The mitral valve is normal in structure. There is moderate thickening of the mitral valve leaflet(s). There is moderate calcification of the mitral valve leaflet(s). Moderate mitral annular calcification. Mild mitral valve stenosis. MV peak  gradient, 12.2 mmHg. The mean mitral valve gradient is 5.7 mmHg with average heart rate of 85 bpm. Tricuspid Valve: The tricuspid valve is normal in structure. Tricuspid valve regurgitation is not demonstrated. No evidence of tricuspid stenosis. Aortic Valve: The aortic valve is tricuspid. There is mild calcification of the aortic valve. There is mild thickening of the aortic valve. Aortic valve regurgitation is not visualized. No aortic stenosis is present. Pulmonic Valve: The pulmonic valve was normal in  structure. Pulmonic valve regurgitation is not visualized. No evidence of pulmonic stenosis. Aorta: The aortic root is normal in size and structure. Venous: The inferior vena cava is dilated in size with greater than 50% respiratory variability, suggesting right atrial pressure of 8 mmHg. IAS/Shunts: No atrial level shunt detected by color flow Doppler. LEFT VENTRICLE PLAX 2D LVIDd:         3.90 cm LVIDs:         2.80 cm LV PW:         0.90 cm LV IVS:        1.00 cm LVOT diam:     1.90 cm LVOT Area:     2.84 cm  IVC IVC diam: 2.00 cm LEFT ATRIUM             Index        RIGHT ATRIUM           Index LA Vol (A2C):  39.4 ml 25.02 ml/m  RA Area:     11.10 cm LA Vol (A4C):   35.0 ml 22.23 ml/m  RA Volume:   24.40 ml  15.50 ml/m LA Biplane Vol: 37.3 ml 23.69 ml/m  MITRAL VALVE MV Peak grad: 12.2 mmHg  SHUNTS MV Mean grad: 5.7 mmHg   Systemic Diam: 1.90 cm MV Vmax:      1.75 m/s MV Vmean:     114.7 cm/s Chilton Si MD Electronically signed by Chilton Si MD Signature Date/Time: 04/27/2023/11:47:38 AM    Final    CT Angio Chest PE W and/or Wo Contrast  Result Date: 04/26/2023 CLINICAL DATA:  Pulmonary embolism (PE) suspected, high prob; Abdominal pain, acute, nonlocalized EXAM: CT ANGIOGRAPHY CHEST CT ABDOMEN AND PELVIS WITH CONTRAST TECHNIQUE: Multidetector CT imaging of the chest was performed using the standard protocol during bolus administration of intravenous contrast. Multiplanar CT image reconstructions and MIPs were obtained to evaluate the vascular anatomy. Multidetector CT imaging of the abdomen and pelvis was performed using the standard protocol during bolus administration of intravenous contrast. RADIATION DOSE REDUCTION: This exam was performed according to the departmental dose-optimization program which includes automated exposure control, adjustment of the mA and/or kV according to patient size and/or use of iterative reconstruction technique. CONTRAST:  80mL OMNIPAQUE IOHEXOL 350 MG/ML  SOLN COMPARISON:  CT chest 07/18/2021, CT abdomen pelvis 02/24/2023 FINDINGS: CTA CHEST FINDINGS Cardiovascular: There is adequate opacification of the pulmonary arterial tree. There are multiple thin web seen within the right lower lobar pulmonary artery best seen on image # 68/4 in keeping with the sequela of chronic pulmonary embolus. However, no intraluminal filling defect is identified to suggest acute pulmonary embolism. The central pulmonary arteries are of normal caliber. Moderate multi-vessel coronary artery calcification. Global cardiac size is within normal limits. No pericardial effusion. Extensive atherosclerotic calcification within the thoracic aorta. No aortic aneurysm. Mediastinum/Nodes: No enlarged mediastinal, hilar, or axillary lymph nodes. Thyroid gland, trachea, and esophagus demonstrate no significant findings. Lungs/Pleura: Lungs are clear. No pneumothorax. Trace bilateral pleural effusions. No central obstructing lesion. Musculoskeletal: Surgical changes of left mastectomy and axillary lymph node dissection are identified. Stable superior endplate fracture of T3. Interval development of a remote appearing superior endplate fracture of T6 with approximately 50% loss of height. No acute bone abnormality. No lytic or blastic bone lesion. Review of the MIP images confirms the above findings. CT ABDOMEN and PELVIS FINDINGS Hepatobiliary: No focal liver abnormality is seen. No gallstones, gallbladder wall thickening, or biliary dilatation. Pancreas: Cystic lesion within the uncinate process of the pancreas is unchanged measuring up to 3.7 cm in greatest dimension and, as noted on prior examination, demonstrates slow interval growth since remote prior examination of 12/09/2017 where this measured 11 mm in greatest dimension. Given its slow interval progression, a benign etiology such as a side branch IPMN is considered most likely. The pancreas is otherwise unremarkable; the main pancreatic duct is  not dilated. No peripancreatic inflammatory changes or fluid collections are seen. Spleen: Unremarkable Adrenals/Urinary Tract: The adrenal glands are unremarkable. The kidneys are normal. Foley catheter balloon is seen within a decompressed bladder lumen. There is superimposed circumferential bladder wall thickening and mild hyperemia noted, however, suggesting a superimposed infectious or inflammatory cystitis. Mild perivesicular inflammatory changes noted. Stomach/Bowel: There is marked hyperemia and circumferential wall thickening involving the rectosigmoid colon and rectum with mild perirectal inflammatory stranding noted in keeping with an infectious or inflammatory proctocolitis as can be seen with ulcerative colitis or pseudomembranous colitis.  There is no evidence of obstruction or perforation. No free intraperitoneal gas. Trace free fluid within the pelvis. The stomach and small bowel are unremarkable. Appendix absent. Vascular/Lymphatic: Extensive aortoiliac atherosclerotic calcification with probable hemodynamically significant stenoses of the celiac axis and proximal superior and inferior mesenteric arteries as well as the renal arteries bilaterally. Calcified plaque within the infrarenal abdominal aorta may result in hemodynamically significant stenosis in this location as well. No aortic aneurysm. No pathologic adenopathy within the abdomen and pelvis. Reproductive: Status post hysterectomy. No adnexal masses. Other: No abdominal wall hernia Musculoskeletal: No acute bone abnormality. No lytic or blastic bone lesion. Degenerative changes are seen within the lumbar spine. Remote L1 and L2 compression deformities are identified. No acute bone abnormality. Review of the MIP images confirms the above findings. IMPRESSION: 1. No definite evidence of acute pulmonary embolism. Multiple thin webs within the right lower lobar pulmonary artery in keeping with the sequela of chronic pulmonary embolus. 2.  Moderate multi-vessel coronary artery calcification. 3. Trace bilateral pleural effusions. 4. Interval development of a remote appearing superior endplate fracture of T6 with approximately 50% loss of height. 5. Stable cystic lesion within the uncinate process of the pancreas measuring up to 3.7 cm in greatest dimension and, as noted on prior examination, demonstrates slow interval growth since remote prior examination of 12/09/2017 where this measured 11 mm in greatest dimension. Given its slow interval progression, a benign etiology such as a side branch IPMN is considered most likely. 6. Extensive aortoiliac atherosclerotic calcification with probable hemodynamically significant stenoses of the celiac axis, proximal superior and inferior mesenteric arteries, and renal arteries bilaterally. Calcified plaque within the infrarenal abdominal aorta may result in hemodynamically significant stenosis in this location as well. Clinical correlation for signs and symptoms of chronic mesenteric ischemia or hemodynamically significant renal artery stenosis may be helpful. 7. Infectious or inflammatory proctocolitis as can be seen with ulcerative colitis or pseudomembranous colitis. No evidence of obstruction or perforation. 8.  Aortic Atherosclerosis (ICD10-I70.0). Electronically Signed   By: Helyn Numbers M.D.   On: 04/26/2023 22:04   CT ABDOMEN PELVIS W CONTRAST  Result Date: 04/26/2023 CLINICAL DATA:  Pulmonary embolism (PE) suspected, high prob; Abdominal pain, acute, nonlocalized EXAM: CT ANGIOGRAPHY CHEST CT ABDOMEN AND PELVIS WITH CONTRAST TECHNIQUE: Multidetector CT imaging of the chest was performed using the standard protocol during bolus administration of intravenous contrast. Multiplanar CT image reconstructions and MIPs were obtained to evaluate the vascular anatomy. Multidetector CT imaging of the abdomen and pelvis was performed using the standard protocol during bolus administration of intravenous  contrast. RADIATION DOSE REDUCTION: This exam was performed according to the departmental dose-optimization program which includes automated exposure control, adjustment of the mA and/or kV according to patient size and/or use of iterative reconstruction technique. CONTRAST:  80mL OMNIPAQUE IOHEXOL 350 MG/ML SOLN COMPARISON:  CT chest 07/18/2021, CT abdomen pelvis 02/24/2023 FINDINGS: CTA CHEST FINDINGS Cardiovascular: There is adequate opacification of the pulmonary arterial tree. There are multiple thin web seen within the right lower lobar pulmonary artery best seen on image # 68/4 in keeping with the sequela of chronic pulmonary embolus. However, no intraluminal filling defect is identified to suggest acute pulmonary embolism. The central pulmonary arteries are of normal caliber. Moderate multi-vessel coronary artery calcification. Global cardiac size is within normal limits. No pericardial effusion. Extensive atherosclerotic calcification within the thoracic aorta. No aortic aneurysm. Mediastinum/Nodes: No enlarged mediastinal, hilar, or axillary lymph nodes. Thyroid gland, trachea, and esophagus demonstrate no significant findings. Lungs/Pleura:  Lungs are clear. No pneumothorax. Trace bilateral pleural effusions. No central obstructing lesion. Musculoskeletal: Surgical changes of left mastectomy and axillary lymph node dissection are identified. Stable superior endplate fracture of T3. Interval development of a remote appearing superior endplate fracture of T6 with approximately 50% loss of height. No acute bone abnormality. No lytic or blastic bone lesion. Review of the MIP images confirms the above findings. CT ABDOMEN and PELVIS FINDINGS Hepatobiliary: No focal liver abnormality is seen. No gallstones, gallbladder wall thickening, or biliary dilatation. Pancreas: Cystic lesion within the uncinate process of the pancreas is unchanged measuring up to 3.7 cm in greatest dimension and, as noted on prior  examination, demonstrates slow interval growth since remote prior examination of 12/09/2017 where this measured 11 mm in greatest dimension. Given its slow interval progression, a benign etiology such as a side branch IPMN is considered most likely. The pancreas is otherwise unremarkable; the main pancreatic duct is not dilated. No peripancreatic inflammatory changes or fluid collections are seen. Spleen: Unremarkable Adrenals/Urinary Tract: The adrenal glands are unremarkable. The kidneys are normal. Foley catheter balloon is seen within a decompressed bladder lumen. There is superimposed circumferential bladder wall thickening and mild hyperemia noted, however, suggesting a superimposed infectious or inflammatory cystitis. Mild perivesicular inflammatory changes noted. Stomach/Bowel: There is marked hyperemia and circumferential wall thickening involving the rectosigmoid colon and rectum with mild perirectal inflammatory stranding noted in keeping with an infectious or inflammatory proctocolitis as can be seen with ulcerative colitis or pseudomembranous colitis. There is no evidence of obstruction or perforation. No free intraperitoneal gas. Trace free fluid within the pelvis. The stomach and small bowel are unremarkable. Appendix absent. Vascular/Lymphatic: Extensive aortoiliac atherosclerotic calcification with probable hemodynamically significant stenoses of the celiac axis and proximal superior and inferior mesenteric arteries as well as the renal arteries bilaterally. Calcified plaque within the infrarenal abdominal aorta may result in hemodynamically significant stenosis in this location as well. No aortic aneurysm. No pathologic adenopathy within the abdomen and pelvis. Reproductive: Status post hysterectomy. No adnexal masses. Other: No abdominal wall hernia Musculoskeletal: No acute bone abnormality. No lytic or blastic bone lesion. Degenerative changes are seen within the lumbar spine. Remote L1 and L2  compression deformities are identified. No acute bone abnormality. Review of the MIP images confirms the above findings. IMPRESSION: 1. No definite evidence of acute pulmonary embolism. Multiple thin webs within the right lower lobar pulmonary artery in keeping with the sequela of chronic pulmonary embolus. 2. Moderate multi-vessel coronary artery calcification. 3. Trace bilateral pleural effusions. 4. Interval development of a remote appearing superior endplate fracture of T6 with approximately 50% loss of height. 5. Stable cystic lesion within the uncinate process of the pancreas measuring up to 3.7 cm in greatest dimension and, as noted on prior examination, demonstrates slow interval growth since remote prior examination of 12/09/2017 where this measured 11 mm in greatest dimension. Given its slow interval progression, a benign etiology such as a side branch IPMN is considered most likely. 6. Extensive aortoiliac atherosclerotic calcification with probable hemodynamically significant stenoses of the celiac axis, proximal superior and inferior mesenteric arteries, and renal arteries bilaterally. Calcified plaque within the infrarenal abdominal aorta may result in hemodynamically significant stenosis in this location as well. Clinical correlation for signs and symptoms of chronic mesenteric ischemia or hemodynamically significant renal artery stenosis may be helpful. 7. Infectious or inflammatory proctocolitis as can be seen with ulcerative colitis or pseudomembranous colitis. No evidence of obstruction or perforation. 8.  Aortic Atherosclerosis (ICD10-I70.0).  Electronically Signed   By: Helyn Numbers M.D.   On: 04/26/2023 22:04   DG Chest Portable 1 View  Result Date: 04/26/2023 CLINICAL DATA:  Syncope, fall EXAM: PORTABLE CHEST 1 VIEW COMPARISON:  10/22/2022 FINDINGS: Cardiac size is within normal limits. Thoracic aorta is tortuous. Calcifications are seen in thoracic aorta. There are no signs of pulmonary  edema or focal pulmonary consolidation. There is no pleural effusion or pneumothorax. Surgical clips are seen in left chest wall. IMPRESSION: No active cardiopulmonary disease. Electronically Signed   By: Ernie Avena M.D.   On: 04/26/2023 17:44     Medical Consultants:   None.   Subjective:    Jodi Valdez continues to have watery bowel movements.  Objective:    Vitals:   04/27/23 2021 04/27/23 2057 04/28/23 0511 04/28/23 0524  BP: (!) 133/59 (!) 140/62 (!) 146/52   Pulse: 85 87 82   Resp: 20 16 18    Temp: 98.1 F (36.7 C) 98.3 F (36.8 C) (!) 97.5 F (36.4 C)   TempSrc: Oral Oral Oral   SpO2: 96% 93% 94%   Weight:    58.4 kg  Height:       SpO2: 94 %   Intake/Output Summary (Last 24 hours) at 04/28/2023 0929 Last data filed at 04/28/2023 0600 Gross per 24 hour  Intake 1724.69 ml  Output 1150 ml  Net 574.69 ml   Filed Weights   04/26/23 1531 04/28/23 0524  Weight: 54.4 kg 58.4 kg    Exam: General exam: In no acute distress. Respiratory system: Good air movement and clear to auscultation. Cardiovascular system: S1 & S2 heard, RRR. No JVD. Gastrointestinal system: Abdomen is nondistended, soft and nontender.  Extremities: No pedal edema. Skin: No rashes, lesions or ulcers Psychiatry: Judgement and insight appear normal. Mood & affect appropriate.    Data Reviewed:    Labs: Basic Metabolic Panel: Recent Labs  Lab 04/26/23 1604 04/27/23 0521 04/28/23 0415  NA 134* 133* 136  K 3.2* 3.6 3.5  CL 103 106 107  CO2 22 17* 20*  GLUCOSE 159* 127* 113*  BUN 18 11 11   CREATININE 0.91 0.68 0.78  CALCIUM 8.3* 7.3* 7.3*  MG 1.6* 2.3  --    GFR Estimated Creatinine Clearance: 45.2 mL/min (by C-G formula based on SCr of 0.78 mg/dL). Liver Function Tests: Recent Labs  Lab 04/26/23 1604 04/27/23 0521 04/28/23 0415  AST 16 19 12*  ALT 28 21 17   ALKPHOS 41 39 34*  BILITOT 0.7 0.6 0.8  PROT 5.8* 4.8* 4.4*  ALBUMIN 3.1* 2.5* 2.2*   No  results for input(s): "LIPASE", "AMYLASE" in the last 168 hours. No results for input(s): "AMMONIA" in the last 168 hours. Coagulation profile No results for input(s): "INR", "PROTIME" in the last 168 hours. COVID-19 Labs  Recent Labs    04/26/23 1604 04/26/23 2045  DDIMER  --  3.76*  CRP 17.0*  --     Lab Results  Component Value Date   SARSCOV2NAA NEGATIVE 04/26/2023    CBC: Recent Labs  Lab 04/26/23 1604 04/27/23 0630 04/28/23 0415  WBC 15.8* 12.3* 7.9  NEUTROABS  --  9.3* 5.3  HGB 13.6 11.8* 10.3*  HCT 40.9 35.5* 31.2*  MCV 96.0 96.5 97.5  PLT 185 162 141*   Cardiac Enzymes: Recent Labs  Lab 04/26/23 1604  CKTOTAL 58   BNP (last 3 results) Recent Labs    11/09/22 1501  PROBNP 550   CBG: Recent Labs  Lab 04/27/23  0209 04/27/23 0759 04/27/23 0905  GLUCAP 132* 129* 127*   D-Dimer: Recent Labs    04/26/23 2045  DDIMER 3.76*   Hgb A1c: No results for input(s): "HGBA1C" in the last 72 hours. Lipid Profile: No results for input(s): "CHOL", "HDL", "LDLCALC", "TRIG", "CHOLHDL", "LDLDIRECT" in the last 72 hours. Thyroid function studies: Recent Labs    04/26/23 1604  TSH 0.523   Anemia work up: No results for input(s): "VITAMINB12", "FOLATE", "FERRITIN", "TIBC", "IRON", "RETICCTPCT" in the last 72 hours. Sepsis Labs: Recent Labs  Lab 04/26/23 1604 04/26/23 1611 04/27/23 0630 04/28/23 0415  WBC 15.8*  --  12.3* 7.9  LATICACIDVEN  --  1.9  --   --    Microbiology Recent Results (from the past 240 hour(s))  Clostridium Difficile by PCR([facility] only )     Status: None   Collection Time: 04/19/23 11:11 AM   Specimen: STOOL   Stool  Result Value Ref Range Status   Toxigenic C. Difficile by PCR Negative Negative Final  Resp panel by RT-PCR (RSV, Flu A&B, Covid) Anterior Nasal Swab     Status: None   Collection Time: 04/26/23  4:04 PM   Specimen: Anterior Nasal Swab  Result Value Ref Range Status   SARS Coronavirus 2 by RT PCR NEGATIVE  NEGATIVE Final    Comment: (NOTE) SARS-CoV-2 target nucleic acids are NOT DETECTED.  The SARS-CoV-2 RNA is generally detectable in upper respiratory specimens during the acute phase of infection. The lowest concentration of SARS-CoV-2 viral copies this assay can detect is 138 copies/mL. A negative result does not preclude SARS-Cov-2 infection and should not be used as the sole basis for treatment or other patient management decisions. A negative result may occur with  improper specimen collection/handling, submission of specimen other than nasopharyngeal swab, presence of viral mutation(s) within the areas targeted by this assay, and inadequate number of viral copies(<138 copies/mL). A negative result must be combined with clinical observations, patient history, and epidemiological information. The expected result is Negative.  Fact Sheet for Patients:  BloggerCourse.com  Fact Sheet for Healthcare Providers:  SeriousBroker.it  This test is no t yet approved or cleared by the [place] FDA and  has been authorized for detection and/or diagnosis of SARS-CoV-2 by FDA under an Emergency Use Authorization (EUA). This EUA will remain  in effect (meaning this test can be used) for the duration of the COVID-19 declaration under Section 564(b)(1) of the Act, 21 U.S.C.section 360bbb-3(b)(1), unless the authorization is terminated  or revoked sooner.       Influenza A by PCR NEGATIVE NEGATIVE Final   Influenza B by PCR NEGATIVE NEGATIVE Final    Comment: (NOTE) The Xpert Xpress SARS-CoV-2/FLU/RSV plus assay is intended as an aid in the diagnosis of influenza from Nasopharyngeal swab specimens and should not be used as a sole basis for treatment. Nasal washings and aspirates are unacceptable for Xpert Xpress SARS-CoV-2/FLU/RSV testing.  Fact Sheet for Patients: BloggerCourse.com  Fact Sheet for Healthcare  Providers: SeriousBroker.it  This test is not yet approved or cleared by the [place] FDA and has been authorized for detection and/or diagnosis of SARS-CoV-2 by FDA under an Emergency Use Authorization (EUA). This EUA will remain in effect (meaning this test can be used) for the duration of the COVID-19 declaration under Section 564(b)(1) of the Act, 21 U.S.C. section 360bbb-3(b)(1), unless the authorization is terminated or revoked.     Resp Syncytial Virus by PCR NEGATIVE NEGATIVE Final    Comment: (NOTE)  Fact Sheet for Patients: BloggerCourse.com  Fact Sheet for Healthcare Providers: SeriousBroker.it  This test is not yet approved or cleared by the [place] FDA and has been authorized for detection and/or diagnosis of SARS-CoV-2 by FDA under an Emergency Use Authorization (EUA). This EUA will remain in effect (meaning this test can be used) for the duration of the COVID-19 declaration under Section 564(b)(1) of the Act, 21 U.S.C. section 360bbb-3(b)(1), unless the authorization is terminated or revoked.  Performed at [facility], [address], [place], [place] [address]   C Difficile Quick Screen w PCR reflex     Status: Abnormal   Collection Time: 04/27/23  1:04 AM   Specimen: STOOL  Result Value Ref Range Status   C Diff antigen POSITIVE (A) NEGATIVE Final   C Diff toxin POSITIVE (A) NEGATIVE Final   C Diff interpretation Toxin producing C. difficile detected.  Final    Comment: CRITICAL RESULT CALLED TO, READ BACK BY AND VERIFIED WITH: Jerilee Hoh RN @ [phone] ON 04/27/2023 BY ABDULHALIM,M Performed at [facility], [address] [place], [place] [address]   Gastrointestinal Panel by PCR , Stool     Status: None   Collection Time: 04/27/23  1:04 AM   Specimen: STOOL  Result Value Ref Range Status   Campylobacter species NOT DETECTED NOT DETECTED  Final   Plesimonas shigelloides NOT DETECTED NOT DETECTED Final   Salmonella species NOT DETECTED NOT DETECTED Final   Yersinia enterocolitica NOT DETECTED NOT DETECTED Final   Vibrio species NOT DETECTED NOT DETECTED Final   Vibrio cholerae NOT DETECTED NOT DETECTED Final   Enteroaggregative E coli (EAEC) NOT DETECTED NOT DETECTED Final   Enteropathogenic E coli (EPEC) NOT DETECTED NOT DETECTED Final   Enterotoxigenic E coli (ETEC) NOT DETECTED NOT DETECTED Final   Shiga like toxin producing E coli (STEC) NOT DETECTED NOT DETECTED Final   Shigella/Enteroinvasive E coli (EIEC) NOT DETECTED NOT DETECTED Final   Cryptosporidium NOT DETECTED NOT DETECTED Final   Cyclospora cayetanensis NOT DETECTED NOT DETECTED Final   Entamoeba histolytica NOT DETECTED NOT DETECTED Final   Giardia lamblia NOT DETECTED NOT DETECTED Final   Adenovirus F40/41 NOT DETECTED NOT DETECTED Final   Astrovirus NOT DETECTED NOT DETECTED Final   Norovirus GI/GII NOT DETECTED NOT DETECTED Final   Rotavirus A NOT DETECTED NOT DETECTED Final   Sapovirus (I, II, IV, and V) NOT DETECTED NOT DETECTED Final    Comment: Performed at [facility], [address] [place], [place] [address]     Medications:    amLODipine  5 mg Oral BID   carvedilol  6.25 mg Oral BID WC   Chlorhexidine Gluconate Cloth  6 each Topical Daily   cholecalciferol  5,000 Units Oral BID   cholestyramine light  4 g Oral BID AC   fidaxomicin  200 mg Oral BID   isosorbide mononitrate  30 mg Oral Daily   levothyroxine  88 mcg Oral Q0600   losartan  50 mg Oral Daily   pantoprazole  40 mg Oral BID AC   pravastatin  20 mg Oral Daily   predniSONE  8 mg Oral Q breakfast   sodium chloride flush  3 mL Intravenous Q12H   traZODone  150 mg Oral QHS   Continuous Infusions:  sodium chloride     heparin 1,100 Units/hr (04/28/23 0457)      LOS: 2 days   Marinda Elk  [facility]  04/28/2023, 9:29  AM

## 2023-04-28 NOTE — Plan of Care (Signed)
°  Problem: Nutrition: °Goal: Adequate nutrition will be maintained °Outcome: Progressing °  °Problem: Coping: °Goal: Level of anxiety will decrease °Outcome: Progressing °  °Problem: Safety: °Goal: Ability to remain free from injury will improve °Outcome: Progressing °  °

## 2023-04-28 NOTE — Progress Notes (Signed)
Progress Note   Subjective  Hospital day #2 Chief Complaint: C. difficile colitis  Today, patient tells me she is feels a little bit better but she still had 2 liquid bowel movements overnight though she slept through them and one again this morning.  Denies any abdominal pain.  No other acute events overnight.   Objective   Vital signs in last 24 hours: Temp:  [97.5 F (36.4 C)-98.3 F (36.8 C)] 97.5 F (36.4 C) (08/07 0511) Pulse Rate:  [82-93] 82 (08/07 0511) Resp:  [16-24] 18 (08/07 0511) BP: (115-146)/(52-62) 146/52 (08/07 0511) SpO2:  [93 %-98 %] 94 % (08/07 0511) Weight:  [58.4 kg] 58.4 kg (08/07 0524) Last BM Date : 04/27/23 General:   Elderly, frail appearing white female in NAD Heart:  Regular rate and rhythm; no murmurs Lungs: Respirations even and unlabored, lungs CTA bilaterally Abdomen:  Soft, nontender and nondistended. Normal bowel sounds. Psych:  Cooperative. Normal mood and affect.  Intake/Output from previous day: 08/06 0701 - 08/07 0700 In: 1724.7 [I.V.:1724.7] Out: 1150 [Urine:1150]   Lab Results: Recent Labs    04/26/23 1604 04/27/23 0630 04/28/23 0415  WBC 15.8* 12.3* 7.9  HGB 13.6 11.8* 10.3*  HCT 40.9 35.5* 31.2*  PLT 185 162 141*   BMET Recent Labs    04/26/23 1604 04/27/23 0521 04/28/23 0415  NA 134* 133* 136  K 3.2* 3.6 3.5  CL 103 106 107  CO2 22 17* 20*  GLUCOSE 159* 127* 113*  BUN 18 11 11   CREATININE 0.91 0.68 0.78  CALCIUM 8.3* 7.3* 7.3*   LFT Recent Labs    04/28/23 0415  PROT 4.4*  ALBUMIN 2.2*  AST 12*  ALT 17  ALKPHOS 34*  BILITOT 0.8   Studies/Results: ECHOCARDIOGRAM LIMITED  Result Date: 04/27/2023    ECHOCARDIOGRAM LIMITED REPORT   Patient Name:   Jodi Valdez Date of Exam: 04/27/2023 Medical Rec #:  [id number]             Height:       64.0 in Accession #:    [id number]            Weight:       120.0 lb Date of Birth:  Sep 16, 1936            BSA:          1.575 m Patient Age:    86 years               BP:           105/92 mmHg Patient Gender: F                     HR:           84 bpm. Exam Location:  Inpatient Procedure: Limited Echo, Cardiac Doppler and Color Doppler Indications:    I26.02 Pulmonary embolus  History:        Patient has prior history of Echocardiogram examinations, most                 recent 11/23/2022. Risk Factors:Hypertension.  Sonographer:    Darlys Gales Referring Phys: [id number] SUBRINA SUNDIL IMPRESSIONS  1. Left ventricular ejection fraction, by estimation, is 60 to 65%. The left ventricle has normal function. The left ventricle has no regional wall motion abnormalities. Left ventricular diastolic parameters are consistent with Grade I diastolic dysfunction (impaired relaxation).  2. Right ventricular systolic function is normal. The right ventricular size is normal.  3. The mitral valve is normal in structure. No evidence of mitral valve regurgitation. Mild mitral stenosis. The mean mitral valve gradient is 5.7 mmHg. Moderate mitral annular calcification.  4. The aortic valve is tricuspid. There is mild calcification of the aortic valve. There is mild thickening of the aortic valve. Aortic valve regurgitation is not visualized. No aortic stenosis is present.  5. The inferior vena cava is dilated in size with >50% respiratory variability, suggesting right atrial pressure of 8 mmHg. FINDINGS  Left Ventricle: Left ventricular ejection fraction, by estimation, is 60 to 65%. The left ventricle has normal function. The left ventricle has no regional wall motion abnormalities. The left ventricular internal cavity size was normal in size. There is  no left ventricular hypertrophy. Left ventricular diastolic parameters are consistent with Grade I diastolic dysfunction (impaired relaxation). Right Ventricle: The right ventricular size is normal. No increase in right ventricular wall thickness. Right ventricular systolic function is normal. Left Atrium: Left atrial size was normal in size.  Right Atrium: Right atrial size was normal in size. Pericardium: There is no evidence of pericardial effusion. Mitral Valve: The mitral valve is normal in structure. There is moderate thickening of the mitral valve leaflet(s). There is moderate calcification of the mitral valve leaflet(s). Moderate mitral annular calcification. Mild mitral valve stenosis. MV peak  gradient, 12.2 mmHg. The mean mitral valve gradient is 5.7 mmHg with average heart rate of 85 bpm. Tricuspid Valve: The tricuspid valve is normal in structure. Tricuspid valve regurgitation is not demonstrated. No evidence of tricuspid stenosis. Aortic Valve: The aortic valve is tricuspid. There is mild calcification of the aortic valve. There is mild thickening of the aortic valve. Aortic valve regurgitation is not visualized. No aortic stenosis is present. Pulmonic Valve: The pulmonic valve was normal in structure. Pulmonic valve regurgitation is not visualized. No evidence of pulmonic stenosis. Aorta: The aortic root is normal in size and structure. Venous: The inferior vena cava is dilated in size with greater than 50% respiratory variability, suggesting right atrial pressure of 8 mmHg. IAS/Shunts: No atrial level shunt detected by color flow Doppler. LEFT VENTRICLE PLAX 2D LVIDd:         3.90 cm LVIDs:         2.80 cm LV PW:         0.90 cm LV IVS:        1.00 cm LVOT diam:     1.90 cm LVOT Area:     2.84 cm  IVC IVC diam: 2.00 cm LEFT ATRIUM             Index        RIGHT ATRIUM           Index LA Vol (A2C):   39.4 ml 25.02 ml/m  RA Area:     11.10 cm LA Vol (A4C):   35.0 ml 22.23 ml/m  RA Volume:   24.40 ml  15.50 ml/m LA Biplane Vol: 37.3 ml 23.69 ml/m  MITRAL VALVE MV Peak grad: 12.2 mmHg  SHUNTS MV Mean grad: 5.7 mmHg   Systemic Diam: 1.90 cm MV Vmax:      1.75 m/s MV Vmean:     114.7 cm/s Chilton Si MD Electronically signed by Chilton Si MD Signature Date/Time: 04/27/2023/11:47:38 AM    Final    CT Angio Chest PE W and/or Wo  Contrast  Result Date: 04/26/2023 CLINICAL DATA:  Pulmonary embolism (PE) suspected, high prob; Abdominal pain, acute, nonlocalized EXAM: CT ANGIOGRAPHY  CHEST CT ABDOMEN AND PELVIS WITH CONTRAST TECHNIQUE: Multidetector CT imaging of the chest was performed using the standard protocol during bolus administration of intravenous contrast. Multiplanar CT image reconstructions and MIPs were obtained to evaluate the vascular anatomy. Multidetector CT imaging of the abdomen and pelvis was performed using the standard protocol during bolus administration of intravenous contrast. RADIATION DOSE REDUCTION: This exam was performed according to the departmental dose-optimization program which includes automated exposure control, adjustment of the mA and/or kV according to patient size and/or use of iterative reconstruction technique. CONTRAST:  80mL OMNIPAQUE IOHEXOL 350 MG/ML SOLN COMPARISON:  CT chest 07/18/2021, CT abdomen pelvis 02/24/2023 FINDINGS: CTA CHEST FINDINGS Cardiovascular: There is adequate opacification of the pulmonary arterial tree. There are multiple thin web seen within the right lower lobar pulmonary artery best seen on image # 68/4 in keeping with the sequela of chronic pulmonary embolus. However, no intraluminal filling defect is identified to suggest acute pulmonary embolism. The central pulmonary arteries are of normal caliber. Moderate multi-vessel coronary artery calcification. Global cardiac size is within normal limits. No pericardial effusion. Extensive atherosclerotic calcification within the thoracic aorta. No aortic aneurysm. Mediastinum/Nodes: No enlarged mediastinal, hilar, or axillary lymph nodes. Thyroid gland, trachea, and esophagus demonstrate no significant findings. Lungs/Pleura: Lungs are clear. No pneumothorax. Trace bilateral pleural effusions. No central obstructing lesion. Musculoskeletal: Surgical changes of left mastectomy and axillary lymph node dissection are identified.  Stable superior endplate fracture of T3. Interval development of a remote appearing superior endplate fracture of T6 with approximately 50% loss of height. No acute bone abnormality. No lytic or blastic bone lesion. Review of the MIP images confirms the above findings. CT ABDOMEN and PELVIS FINDINGS Hepatobiliary: No focal liver abnormality is seen. No gallstones, gallbladder wall thickening, or biliary dilatation. Pancreas: Cystic lesion within the uncinate process of the pancreas is unchanged measuring up to 3.7 cm in greatest dimension and, as noted on prior examination, demonstrates slow interval growth since remote prior examination of 12/09/2017 where this measured 11 mm in greatest dimension. Given its slow interval progression, a benign etiology such as a side branch IPMN is considered most likely. The pancreas is otherwise unremarkable; the main pancreatic duct is not dilated. No peripancreatic inflammatory changes or fluid collections are seen. Spleen: Unremarkable Adrenals/Urinary Tract: The adrenal glands are unremarkable. The kidneys are normal. Foley catheter balloon is seen within a decompressed bladder lumen. There is superimposed circumferential bladder wall thickening and mild hyperemia noted, however, suggesting a superimposed infectious or inflammatory cystitis. Mild perivesicular inflammatory changes noted. Stomach/Bowel: There is marked hyperemia and circumferential wall thickening involving the rectosigmoid colon and rectum with mild perirectal inflammatory stranding noted in keeping with an infectious or inflammatory proctocolitis as can be seen with ulcerative colitis or pseudomembranous colitis. There is no evidence of obstruction or perforation. No free intraperitoneal gas. Trace free fluid within the pelvis. The stomach and small bowel are unremarkable. Appendix absent. Vascular/Lymphatic: Extensive aortoiliac atherosclerotic calcification with probable hemodynamically significant  stenoses of the celiac axis and proximal superior and inferior mesenteric arteries as well as the renal arteries bilaterally. Calcified plaque within the infrarenal abdominal aorta may result in hemodynamically significant stenosis in this location as well. No aortic aneurysm. No pathologic adenopathy within the abdomen and pelvis. Reproductive: Status post hysterectomy. No adnexal masses. Other: No abdominal wall hernia Musculoskeletal: No acute bone abnormality. No lytic or blastic bone lesion. Degenerative changes are seen within the lumbar spine. Remote L1 and L2 compression deformities are identified. No acute  bone abnormality. Review of the MIP images confirms the above findings. IMPRESSION: 1. No definite evidence of acute pulmonary embolism. Multiple thin webs within the right lower lobar pulmonary artery in keeping with the sequela of chronic pulmonary embolus. 2. Moderate multi-vessel coronary artery calcification. 3. Trace bilateral pleural effusions. 4. Interval development of a remote appearing superior endplate fracture of T6 with approximately 50% loss of height. 5. Stable cystic lesion within the uncinate process of the pancreas measuring up to 3.7 cm in greatest dimension and, as noted on prior examination, demonstrates slow interval growth since remote prior examination of 12/09/2017 where this measured 11 mm in greatest dimension. Given its slow interval progression, a benign etiology such as a side branch IPMN is considered most likely. 6. Extensive aortoiliac atherosclerotic calcification with probable hemodynamically significant stenoses of the celiac axis, proximal superior and inferior mesenteric arteries, and renal arteries bilaterally. Calcified plaque within the infrarenal abdominal aorta may result in hemodynamically significant stenosis in this location as well. Clinical correlation for signs and symptoms of chronic mesenteric ischemia or hemodynamically significant renal artery stenosis  may be helpful. 7. Infectious or inflammatory proctocolitis as can be seen with ulcerative colitis or pseudomembranous colitis. No evidence of obstruction or perforation. 8.  Aortic Atherosclerosis (ICD10-I70.0). Electronically Signed   By: Helyn Numbers M.D.   On: 04/26/2023 22:04   CT ABDOMEN PELVIS W CONTRAST  Result Date: 04/26/2023 CLINICAL DATA:  Pulmonary embolism (PE) suspected, high prob; Abdominal pain, acute, nonlocalized EXAM: CT ANGIOGRAPHY CHEST CT ABDOMEN AND PELVIS WITH CONTRAST TECHNIQUE: Multidetector CT imaging of the chest was performed using the standard protocol during bolus administration of intravenous contrast. Multiplanar CT image reconstructions and MIPs were obtained to evaluate the vascular anatomy. Multidetector CT imaging of the abdomen and pelvis was performed using the standard protocol during bolus administration of intravenous contrast. RADIATION DOSE REDUCTION: This exam was performed according to the departmental dose-optimization program which includes automated exposure control, adjustment of the mA and/or kV according to patient size and/or use of iterative reconstruction technique. CONTRAST:  80mL OMNIPAQUE IOHEXOL 350 MG/ML SOLN COMPARISON:  CT chest 07/18/2021, CT abdomen pelvis 02/24/2023 FINDINGS: CTA CHEST FINDINGS Cardiovascular: There is adequate opacification of the pulmonary arterial tree. There are multiple thin web seen within the right lower lobar pulmonary artery best seen on image # 68/4 in keeping with the sequela of chronic pulmonary embolus. However, no intraluminal filling defect is identified to suggest acute pulmonary embolism. The central pulmonary arteries are of normal caliber. Moderate multi-vessel coronary artery calcification. Global cardiac size is within normal limits. No pericardial effusion. Extensive atherosclerotic calcification within the thoracic aorta. No aortic aneurysm. Mediastinum/Nodes: No enlarged mediastinal, hilar, or axillary  lymph nodes. Thyroid gland, trachea, and esophagus demonstrate no significant findings. Lungs/Pleura: Lungs are clear. No pneumothorax. Trace bilateral pleural effusions. No central obstructing lesion. Musculoskeletal: Surgical changes of left mastectomy and axillary lymph node dissection are identified. Stable superior endplate fracture of T3. Interval development of a remote appearing superior endplate fracture of T6 with approximately 50% loss of height. No acute bone abnormality. No lytic or blastic bone lesion. Review of the MIP images confirms the above findings. CT ABDOMEN and PELVIS FINDINGS Hepatobiliary: No focal liver abnormality is seen. No gallstones, gallbladder wall thickening, or biliary dilatation. Pancreas: Cystic lesion within the uncinate process of the pancreas is unchanged measuring up to 3.7 cm in greatest dimension and, as noted on prior examination, demonstrates slow interval growth since remote prior examination of 12/09/2017 where  this measured 11 mm in greatest dimension. Given its slow interval progression, a benign etiology such as a side branch IPMN is considered most likely. The pancreas is otherwise unremarkable; the main pancreatic duct is not dilated. No peripancreatic inflammatory changes or fluid collections are seen. Spleen: Unremarkable Adrenals/Urinary Tract: The adrenal glands are unremarkable. The kidneys are normal. Foley catheter balloon is seen within a decompressed bladder lumen. There is superimposed circumferential bladder wall thickening and mild hyperemia noted, however, suggesting a superimposed infectious or inflammatory cystitis. Mild perivesicular inflammatory changes noted. Stomach/Bowel: There is marked hyperemia and circumferential wall thickening involving the rectosigmoid colon and rectum with mild perirectal inflammatory stranding noted in keeping with an infectious or inflammatory proctocolitis as can be seen with ulcerative colitis or pseudomembranous  colitis. There is no evidence of obstruction or perforation. No free intraperitoneal gas. Trace free fluid within the pelvis. The stomach and small bowel are unremarkable. Appendix absent. Vascular/Lymphatic: Extensive aortoiliac atherosclerotic calcification with probable hemodynamically significant stenoses of the celiac axis and proximal superior and inferior mesenteric arteries as well as the renal arteries bilaterally. Calcified plaque within the infrarenal abdominal aorta may result in hemodynamically significant stenosis in this location as well. No aortic aneurysm. No pathologic adenopathy within the abdomen and pelvis. Reproductive: Status post hysterectomy. No adnexal masses. Other: No abdominal wall hernia Musculoskeletal: No acute bone abnormality. No lytic or blastic bone lesion. Degenerative changes are seen within the lumbar spine. Remote L1 and L2 compression deformities are identified. No acute bone abnormality. Review of the MIP images confirms the above findings. IMPRESSION: 1. No definite evidence of acute pulmonary embolism. Multiple thin webs within the right lower lobar pulmonary artery in keeping with the sequela of chronic pulmonary embolus. 2. Moderate multi-vessel coronary artery calcification. 3. Trace bilateral pleural effusions. 4. Interval development of a remote appearing superior endplate fracture of T6 with approximately 50% loss of height. 5. Stable cystic lesion within the uncinate process of the pancreas measuring up to 3.7 cm in greatest dimension and, as noted on prior examination, demonstrates slow interval growth since remote prior examination of 12/09/2017 where this measured 11 mm in greatest dimension. Given its slow interval progression, a benign etiology such as a side branch IPMN is considered most likely. 6. Extensive aortoiliac atherosclerotic calcification with probable hemodynamically significant stenoses of the celiac axis, proximal superior and inferior mesenteric  arteries, and renal arteries bilaterally. Calcified plaque within the infrarenal abdominal aorta may result in hemodynamically significant stenosis in this location as well. Clinical correlation for signs and symptoms of chronic mesenteric ischemia or hemodynamically significant renal artery stenosis may be helpful. 7. Infectious or inflammatory proctocolitis as can be seen with ulcerative colitis or pseudomembranous colitis. No evidence of obstruction or perforation. 8.  Aortic Atherosclerosis (ICD10-I70.0). Electronically Signed   By: Helyn Numbers M.D.   On: 04/26/2023 22:04   DG Chest Portable 1 View  Result Date: 04/26/2023 CLINICAL DATA:  Syncope, fall EXAM: PORTABLE CHEST 1 VIEW COMPARISON:  10/22/2022 FINDINGS: Cardiac size is within normal limits. Thoracic aorta is tortuous. Calcifications are seen in thoracic aorta. There are no signs of pulmonary edema or focal pulmonary consolidation. There is no pleural effusion or pneumothorax. Surgical clips are seen in left chest wall. IMPRESSION: No active cardiopulmonary disease. Electronically Signed   By: Ernie Avena M.D.   On: 04/26/2023 17:44       Assessment / Plan:   Assessment: 1.  C. difficile proctocolitis: History of recent C. difficile colitis on Vanco  taper started around 02/24/2023 for 6 weeks, continue with diarrhea, recheck C. difficile antigen 7/29 which is negative, started on Cholestyramine 4 g twice daily and Imodium, continued diarrhea, C. difficile PCR positive here again, no reported slowing of diarrhea overnight 2.  New incidental finding of pulmonary embolism: Patient started on IV heparin drip this hospitalization 3.  Grade 1 diastolic heart failure preserved EF 4.  COVID-19 long-hauler with shortness of breath: Also on oxygen as needed at home 5.  GERD: Continued on Protonix 40 mg twice daily 6.  History of breast cancer: Status post chemo/radiation/mastectomy of the left breast in 2022 7.  Polymyalgia rheumatica:  Currently on Prednisone 8 mg daily 8.  Pancreatic insufficiency and pancreatic cyst: Continues on Cholestyramine 4 g twice daily, CT abdomen shows known pancreatic cyst slowly enlarging in size suspicious for IPMN  Plan: 1.  Continued Dificid 200 mg twice daily for 10 days 2.  Patient has fecal tube in place, will need to monitor output to ensure this medication is helping her 3.  Continue supportive measures  Thanks you for your kind consultation, we will continue to follow.   LOS: 2 days   Unk Lightning  04/28/2023, 11:02 AM

## 2023-04-28 NOTE — Consult Note (Signed)
[facility]    Date of Admission:  04/26/2023   Total days of inpatient antibiotics 2        Reason for Consult: C diff colitis    Principal Problem:   Proctocolitis Active Problems:   Essential hypertension   COVID-19 long hauler manifesting chronic dyspnea   Polymyalgia (HCC)   Hyperlipidemia   Concern for C. difficile colitis   SIRS (systemic inflammatory response syndrome) (HCC)   Hypertensive crisis   Hyponatremia   Hypokalemia   Hypomagnesemia   Diarrhea   Fall   Pancreatic insufficiency   Pancreatic cyst   Insomnia   Hashimoto's thyroiditis   Assessment: 87 year old female with history of polymyalgia rheumatica, Hashimoto thyroiditis, recurrent C. difficile who relapsed and completed a taper of vancomycin x 6 weeks admitted with recurrent C. difficile colitis:  #Recurrent C. difficile - Testing history as below: - 01/06/2023 C. difficile testing positive treated with vancomycin x 10 days - Admitted 6/5-2 6/7 for C. difficile colitis, toxin and antigen positive initially did not improve Dificid.  Treated with vancomycin for 6-week treatment. - C. difficile testing including antigen/toxin /PCR negative on 7/29 started for.  Started on cholestyramine by GI. - Developed large-volume loose stools, C. difficile toxin on 04/27/2023 positive for antigen and toxin.  Started on Dificid did not improve.CT abdomen pelvis showed infectious or inflammatory proctocolitis is seen with ulcerative colitis/pseudomembranous colitis, no evidence of obstruction or perforation -I spoke to the patient today and she states that following her vancomycin taper, her stools were formed in a "pile".  Then day prior to admission she had large-volume watery stool.  Today, on 8/7 patient states she is starting to feel better and stools are less frequent.. Recommendations:  -D/C Dificid - Start vancomycin 125mg  PO qid x14 days. If pt improves and is discharged would  like to do  Bezlotoxumab  infusion followed by fecal transplant outpatient. We can avoid taper in this case if patient medications are admission and is started as above. - Agree that she does not approve transfer requiring facility of fecal transplant. - Ultimately may require colonoscopy. Microbiology:   Antibiotics: Dificid 8/5-present   HPI: Jodi Valdez is a 87 y.o. female breast cancer status post chemoradiation and mastectomy 2022, hypertension, hyperlipidemia, diastolic heart failure, Hashimoto's thyroiditis, long-haul COVID, polymyalgia rheumatica admitted with C. difficile colitis.  Patient has a history of recurrent C. difficile.  To have C. difficile status post 10 days of vancomycin/17/2024, recent admission 6/5 - 6/7 for C. difficile, at that point antigen and toxin were positive.  She had been on Dificid although her symptoms did not improve.  Given vancomycin with taper x 6 weeks.  She was seen by GI and noted that on 7/29 C. difficile testing was negative.  Scented to the ED with development of 2 episodes of large-volume stools, generalized weakness with near fall.  No AKI noted.  CT abdomen pelvis showed infectious or inflammatory proctocolitis is seen with ulcerative colitis/pseudomembranous colitis, no evidence of obstruction or perforation.  Started on Dificid, symptoms did not improve.  ID engaged.  Review of Systems: Review of Systems  All other systems reviewed and are negative.   Past Medical History:  Diagnosis Date   Allergy    Anemia    Asthma    Breast cancer (HCC)    Cancer (HCC) 11/01/2000   left breast cancer   GERD (gastroesophageal reflux disease)    Heart disease  Hyperlipidemia    Hypertension    Long COVID    Personal history of chemotherapy    2002   Personal history of radiation therapy    2002   Polymyalgia (HCC)     Social History   Tobacco Use   Smoking status: Never   Smokeless tobacco: Never  Vaping Use   Vaping status:  Never Used  Substance Use Topics   Alcohol use: No    Alcohol/week: 0.0 standard drinks of alcohol   Drug use: No    Family History  Problem Relation Age of Onset   Colon cancer Neg Hx    Colon polyps Neg Hx    Diabetes Neg Hx    Kidney disease Neg Hx    Esophageal cancer Neg Hx    Scheduled Meds:  amLODipine  5 mg Oral BID   carvedilol  6.25 mg Oral BID WC   Chlorhexidine Gluconate Cloth  6 each Topical Daily   cholecalciferol  5,000 Units Oral BID   cholestyramine light  4 g Oral BID AC   fidaxomicin  200 mg Oral BID   isosorbide mononitrate  30 mg Oral Daily   levothyroxine  88 mcg Oral Q0600   losartan  50 mg Oral Daily   pantoprazole  40 mg Oral BID AC   potassium chloride  40 mEq Oral BID   pravastatin  20 mg Oral Daily   predniSONE  8 mg Oral Q breakfast   sodium chloride flush  3 mL Intravenous Q12H   traZODone  150 mg Oral QHS   Continuous Infusions:  sodium chloride     heparin 1,100 Units/hr (04/28/23 0457)   PRN Meds:.sodium chloride, acetaminophen **OR** acetaminophen, hydrALAZINE, ipratropium-albuterol, nitroGLYCERIN, ondansetron **OR** ondansetron (ZOFRAN) IV, sodium chloride flush Allergies  Allergen Reactions   Propranolol Other (See Comments)    Hallucinations    Gabapentin Other (See Comments)    Hallucinations   Metformin And Related Diarrhea   Codeine Nausea And Vomiting   Nitrofuran Derivatives Nausea And Vomiting and Other (See Comments)    GI Intolerance    OBJECTIVE: Blood pressure (!) 146/52, pulse 82, temperature (!) 97.5 F (36.4 C), temperature source Oral, resp. rate 18, height 5\' 4"  (1.626 m), weight 58.4 kg, SpO2 94%.  Physical Exam Constitutional:      Appearance: Normal appearance.  HENT:     Head: Normocephalic and atraumatic.     Right Ear: Tympanic membrane normal.     Left Ear: Tympanic membrane normal.     Nose: Nose normal.     Mouth/Throat:     Mouth: Mucous membranes are moist.  Eyes:     Extraocular Movements:  Extraocular movements intact.     Conjunctiva/sclera: Conjunctivae normal.     Pupils: Pupils are equal, round, and reactive to light.  Cardiovascular:     Rate and Rhythm: Normal rate and regular rhythm.     Heart sounds: No murmur heard.    No friction rub. No gallop.  Pulmonary:     Effort: Pulmonary effort is normal.     Breath sounds: Normal breath sounds.  Abdominal:     General: Abdomen is flat.     Palpations: Abdomen is soft.  Musculoskeletal:        General: Normal range of motion.  Skin:    General: Skin is warm and dry.  Neurological:     General: No focal deficit present.     Mental Status: She is alert and oriented to person, place,  and time.  Psychiatric:        Mood and Affect: Mood normal.     Lab Results Lab Results  Component Value Date   WBC 7.9 04/28/2023   HGB 10.3 (L) 04/28/2023   HCT 31.2 (L) 04/28/2023   MCV 97.5 04/28/2023   PLT 141 (L) 04/28/2023    Lab Results  Component Value Date   CREATININE 0.78 04/28/2023   BUN 11 04/28/2023   NA 136 04/28/2023   K 3.5 04/28/2023   CL 107 04/28/2023   CO2 20 (L) 04/28/2023    Lab Results  Component Value Date   ALT 17 04/28/2023   AST 12 (L) 04/28/2023   ALKPHOS 34 (L) 04/28/2023   BILITOT 0.8 04/28/2023       Danelle Earthly, MD [facility] 04/28/2023, 11:02 AM   I have personally spent 84 minutes involved in face-to-face and non-face-to-face activities for this patient on the day of the visit. Professional time spent includes the following activities: Preparing to see the patient (review of tests), Obtaining and/or reviewing separately obtained history (admission/discharge record), Performing a medically appropriate examination and/or evaluation , Ordering medications/tests/procedures, referring and communicating with other health care professionals, Documenting clinical information in the EMR, Independently interpreting results (not  separately reported), Communicating results to the patient/family/caregiver, Counseling and educating the patient/family/caregiver and Care coordination (not separately reported).

## 2023-04-28 NOTE — TOC Initial Note (Addendum)
Transition of Care [facility]) - Initial/Assessment Note    Patient Details  Name: Jodi Valdez MRN: [id number] Date of Birth: 06-15-1936  [facility]) CM/SW Contact:    Larrie Kass, LCSW Phone Number: 04/28/2023, 10:27 AM  Clinical Narrative:                 CSW received consult regarding medication assistance and  Coverage evaluate for Eliquis and rivaroxaban coverage. Pt does not qualify for [facility] program due to being insured.  TOC reach out to pharmacy Eliquis starter 30 day $47 , Xarelto starter 30 day $47. Pt has rec for PT eval. TOC to follow for rec.    Barriers to Discharge: Continued Medical Work up   Patient Goals and CMS Choice            Expected Discharge Plan and Services                                              Prior Living Arrangements/Services                       Activities of Daily Living Home Assistive Devices/Equipment: None ADL Screening (condition at time of admission) Patient's cognitive ability adequate to safely complete daily activities?: Yes Is the patient deaf or have difficulty hearing?: No Does the patient have difficulty seeing, even when wearing glasses/contacts?: No Does the patient have difficulty concentrating, remembering, or making decisions?: No Patient able to express need for assistance with ADLs?: No Does the patient have difficulty dressing or bathing?: No Independently performs ADLs?: Yes (appropriate for developmental age) Does the patient have difficulty walking or climbing stairs?: No Weakness of Legs: None Weakness of Arms/Hands: None  Permission Sought/Granted                  Emotional Assessment              Admission diagnosis:  Colitis [K52.9] Idiopathic proctocolitis (HCC) [K51.30] Patient Active Problem List   Diagnosis Date Noted   Pancreatic insufficiency 04/27/2023   Pancreatic cyst 04/27/2023   Insomnia 04/27/2023   Hashimoto's thyroiditis  04/27/2023   SIRS (systemic inflammatory response syndrome) (HCC) 04/26/2023   Hypertensive crisis 04/26/2023   Hyponatremia 04/26/2023   Hypokalemia 04/26/2023   Hypomagnesemia 04/26/2023   Proctocolitis 04/26/2023   Diarrhea 04/26/2023   Fall 04/26/2023   Acute urinary retention 02/25/2023   Generalized weakness 02/25/2023   Concern for C. difficile colitis 02/24/2023   Cardiomyopathy (HCC) ejection fraction 40 to 45% in October 2022 based on echo 08/08/2021   Polymyalgia (HCC) 08/06/2021   Personal history of radiation therapy 08/06/2021   Personal history of chemotherapy 08/06/2021   Hyperlipidemia 08/06/2021   Breast cancer (HCC) 08/06/2021   Allergy 08/06/2021   Internal hemorrhoids with complication 04/14/2021   Hematochezia 06/17/2020   Heme positive stool 06/04/2020   Anemia 06/04/2020   Gastroesophageal reflux disease 06/04/2020   Abdominal pain, epigastric 06/04/2020   NSAID long-term use 06/04/2020   Swelling of both lower extremities 05/23/2020   COVID-19 long hauler manifesting chronic dyspnea 02/21/2018   Abdominal pain, lower 10/07/2014   Abnormal findings on radiological examination of gastrointestinal tract 07/17/2014   ABDOMINAL PAIN -GENERALIZED 12/31/2009   HYPERTHYROIDISM 12/09/2009   Essential hypertension 12/09/2009   Constipation 12/09/2009   ABDOMINAL PAIN RIGHT LOWER QUADRANT 12/09/2009  ABDOMINAL PAIN, LEFT LOWER QUADRANT 12/09/2009   PERSONAL HX BREAST CANCER 12/09/2009   Cancer (HCC) 11/01/2000   PCP:  Ailene Ravel, MD Pharmacy:   [facility] [phone] [place] [facility], [place] - [address] AT [facility] [address] [place] [address] Phone: [phone] Fax: [phone]  [facility] - [place], [place] - [address] [place] [address] Phone: [phone] Fax: [phone]  [facility] - [facility] [address] [place] [address] Phone:  [phone] Fax: [phone]     Social Determinants of Health (SDOH) Social History: SDOH Screenings   Food Insecurity: No Food Insecurity (04/27/2023)  Housing: Patient Declined (04/27/2023)  Transportation Needs: No Transportation Needs (04/27/2023)  Utilities: Not At Risk (04/27/2023)  Alcohol Screen: Low Risk  (08/04/2021)  Depression (PHQ2-9): Low Risk  (08/04/2021)  Financial Resource Strain: Low Risk  (08/04/2021)  Physical Activity: Inactive (08/04/2021)  Social Connections: Moderately Integrated (08/04/2021)  Stress: Stress Concern Present (08/04/2021)  Tobacco Use: Low Risk  (04/27/2023)   SDOH Interventions:     Readmission Risk Interventions    02/26/2023    9:17 AM  Readmission Risk Prevention Plan  Transportation Screening Complete  PCP or Specialist Appt within 3-5 Days Complete  HRI or Home Care Consult Complete  Social Work Consult for Recovery Care Planning/Counseling Complete  Palliative Care Screening Not Applicable  Medication Review Oceanographer) Complete

## 2023-04-28 NOTE — Evaluation (Signed)
Occupational Therapy Evaluation Patient Details Name: Jodi Valdez MRN: [id number] DOB: January 09, 1936 Today's Date: 04/28/2023   History of Present Illness Mrs. Shortt is a 87 yr old female admitted to the hospital 04-26-23 with loss stools, generalized weakness, and a near fall. PMH: polymyalgia rheumatica, breast CA s/p chemo/radiation, HTN, HLD, diastolic heart failure, COVID long hauler, recent hospitalization due to c.diff   Clinical Impression   The pt is currently presenting slightly below her baseline level of functioning for self-care management, given the below listed deficits (see OT problem list). She requires supervision to min guard assist for tasks, including lower body dressing, sit to stand, ambulation with RW, and bed mobility. She reports feelings of acute generalized weakness. She will benefit from further OT services in the acute care setting to maximize her independence with ADLs and to facilitate her safe return home at discharge. OT anticipates she will not require post-acute therapy services.        If plan is discharge home, recommend the following: Assist for transportation;Help with stairs or ramp for entrance;Assistance with cooking/housework;Direct supervision/assist for medications management    Functional Status Assessment  Patient has had a recent decline in their functional status and demonstrates the ability to make significant improvements in function in a reasonable and predictable amount of time.  Equipment Recommendations  None recommended by OT    Recommendations for Other Services       Precautions / Restrictions Precautions Precautions: Fall Precaution Comments: enteric precautions Restrictions Weight Bearing Restrictions: No      Mobility Bed Mobility Overal bed mobility: Needs Assistance Bed Mobility: Supine to Sit     Supine to sit: Supervision, HOB elevated, Used rails          Transfers Overall transfer level: Needs  assistance Equipment used: Rolling walker (2 wheels) Transfers: Sit to/from Stand Sit to Stand: Contact guard assist                  Balance     Sitting balance-Leahy Scale: Good         Standing balance comment: SBA to min guard with RW                           ADL either performed or assessed with clinical judgement   ADL Overall ADL's : Needs assistance/impaired Eating/Feeding: Independent;Sitting Eating/Feeding Details (indicate cue type and reason): based on clinical judgement Grooming: Set up;Contact guard assist Grooming Details (indicate cue type and reason): Set-up seated or min guard standing         Upper Body Dressing : Set up;Sitting Upper Body Dressing Details (indicate cue type and reason): simulated seated EOB Lower Body Dressing: Supervision/safety Lower Body Dressing Details (indicate cue type and reason): She doffed then donned her socks seated EOB, demonstrating the figure 4 technique.                     Vision   Additional Comments: She correctly read the time depicted on the wall clock. Her spouse reported she has a history of macular degeneration.            Pertinent Vitals/Pain Pain Assessment Pain Assessment: No/denies pain     Extremity/Trunk Assessment Upper Extremity Assessment Upper Extremity Assessment: Overall WFL for tasks assessed   Lower Extremity Assessment Lower Extremity Assessment:  (slight generalized weakness)       Communication Communication Communication: No apparent difficulties   Cognition Arousal: Alert  Behavior During Therapy: [facility] for tasks assessed/performed        General Comments: Oriented to person, place, and situation. Disoriented to month and year. Able to follow 1 step commands without difficulty. She reports intermittent difficulty with her memory and cognition, due to lingering effects from having COVID 2 years ago                Home Living Family/patient  expects to be discharged to:: Private residence Living Arrangements: Spouse/significant other Available Help at Discharge: Family;Available 24 hours/day Type of Home: House Home Access: Stairs to enter [employer] of Steps: 3   Home Layout: One level     Bathroom Shower/Tub: Tub/shower unit         Home Equipment: Pharmacist, hospital (2 wheels);Wheelchair - manual;Cane - single point;BSC/3in1          Prior Functioning/Environment               Mobility Comments: Uses her RW for ambulation. Reports a previous RT knee injury ~ 1 year ago which affects her gait. Denies falls. ADLs Comments: The pt reports managing ADLs without the need for assistance; she takes spongebaths for bathing. She has a friend who assists with bringing meals and her spouse performs most of the cleaning.        OT Problem List: Decreased strength;Impaired balance (sitting and/or standing);Impaired vision/perception;Decreased cognition      OT Treatment/Interventions: Self-care/ADL training;Therapeutic exercise;Energy conservation;DME and/or AE instruction;Therapeutic activities;Balance training;Patient/family education    OT Goals(Current goals can be found in the care plan section) Acute Rehab OT Goals Patient Stated Goal: for her c. diff infection to clear up OT Goal Formulation: With patient Time For Goal Achievement: 05/12/23 Potential to Achieve Goals: Good ADL Goals Pt Will Perform Grooming: with modified independence;standing Pt Will Perform Lower Body Dressing: with modified independence;sit to/from stand Pt Will Transfer to Toilet: with modified independence;ambulating Pt Will Perform Toileting - Clothing Manipulation and hygiene: with modified independence;sit to/from stand  OT Frequency: Min 1X/week       AM-PAC OT "6 Clicks" Daily Activity     Outcome Measure Help from another person eating meals?: None Help from another person taking care of personal  grooming?: A Little Help from another person toileting, which includes using toliet, bedpan, or urinal?: A Little Help from another person bathing (including washing, rinsing, drying)?: A Little Help from another person to put on and taking off regular upper body clothing?: None Help from another person to put on and taking off regular lower body clothing?: A Little 6 Click Score: 20   End of Session Equipment Utilized During Treatment: Rolling walker (2 wheels) Nurse Communication: Mobility status  Activity Tolerance: Patient tolerated treatment well Patient left: in bed;with call bell/phone within reach;with family/visitor present;with bed alarm set  OT Visit Diagnosis: Unsteadiness on feet (R26.81);Muscle weakness (generalized) (M62.81)                Time: 1610-9604 OT Time Calculation (min): 30 min Charges:  OT General Charges $OT Visit: 1 Visit OT Evaluation $OT Eval Low Complexity: 1 Low OT Treatments $Therapeutic Activity: 8-22 mins    Reuben Likes, OTR/L 04/28/2023, 1:21 PM

## 2023-04-28 NOTE — Progress Notes (Signed)
PT Cancellation Note  Patient Details Name: Jodi Valdez MRN: [id number] DOB: Nov 15, 1935   Cancelled Treatment:    Reason Eval/Treat Not Completed: Fatigue/lethargy limiting ability to participate;Pain limiting ability to participate, has a head ache , ambulated x 1  earlier and feels too fatigued.   Rada Hay 04/28/2023, 2:14 PM

## 2023-04-28 NOTE — Progress Notes (Signed)
ANTICOAGULATION CONSULT NOTE - Follow Up Consult  Pharmacy Consult for Heparin Indication: pulmonary embolus  Allergies  Allergen Reactions   Propranolol Other (See Comments)    Hallucinations    Gabapentin Other (See Comments)    Hallucinations   Metformin And Related Diarrhea   Codeine Nausea And Vomiting   Nitrofuran Derivatives Nausea And Vomiting and Other (See Comments)    GI Intolerance    Patient Measurements: Height: 5\' 4"  (162.6 cm) Weight: 54.4 kg (120 lb) IBW/kg (Calculated) : 54.7 Heparin Dosing Weight: 54.4 kg  Vital Signs: Temp: 98.3 F (36.8 C) (08/06 2057) Temp Source: Oral (08/06 2057) BP: 140/62 (08/06 2057) Pulse Rate: 87 (08/06 2057)  Labs: Recent Labs    04/26/23 1604 04/26/23 1835 04/27/23 0521 04/27/23 0630 04/27/23 0910 04/27/23 1853 04/28/23 0415  HGB 13.6  --   --  11.8*  --   --  10.3*  HCT 40.9  --   --  35.5*  --   --  31.2*  PLT 185  --   --  162  --   --  141*  HEPARINUNFRC  --   --   --   --  <0.10* <0.10* 0.77*  CREATININE 0.91  --  0.68  --   --   --   --   CKTOTAL 58  --   --   --   --   --   --   TROPONINIHS 16 16  --   --   --   --   --     Estimated Creatinine Clearance: 45 mL/min (by C-G formula based on SCr of 0.68 mg/dL).   Assessment: AC/Heme: New finding of chronic PE on CT. Ddimer 3.75  04/28/2023 HL 0.77 supra-therapeutic on 1200 units/hr Hgb 10.3, plts 141 No bleeding noted per RN   Goal of Therapy:  Heparin level 0.3-0.7 units/ml Monitor platelets by anticoagulation protocol: Yes   Plan:  - decrease heparin drip to 1100 units/hr - Recheck Heparin level in 8 hrs. - Daily HL and CBC  Arley Phenix RPh 04/28/2023, 4:51 AM

## 2023-04-29 ENCOUNTER — Telehealth (HOSPITAL_COMMUNITY): Payer: Self-pay | Admitting: Pharmacy Technician

## 2023-04-29 ENCOUNTER — Other Ambulatory Visit (HOSPITAL_COMMUNITY): Payer: Self-pay

## 2023-04-29 DIAGNOSIS — R933 Abnormal findings on diagnostic imaging of other parts of digestive tract: Secondary | ICD-10-CM | POA: Diagnosis not present

## 2023-04-29 DIAGNOSIS — A0471 Enterocolitis due to Clostridium difficile, recurrent: Secondary | ICD-10-CM

## 2023-04-29 DIAGNOSIS — R627 Adult failure to thrive: Secondary | ICD-10-CM

## 2023-04-29 DIAGNOSIS — A09 Infectious gastroenteritis and colitis, unspecified: Secondary | ICD-10-CM

## 2023-04-29 DIAGNOSIS — I2699 Other pulmonary embolism without acute cor pulmonale: Secondary | ICD-10-CM | POA: Diagnosis not present

## 2023-04-29 DIAGNOSIS — I5030 Unspecified diastolic (congestive) heart failure: Secondary | ICD-10-CM

## 2023-04-29 LAB — GLUCOSE, CAPILLARY: Glucose-Capillary: 106 mg/dL — ABNORMAL HIGH (ref 70–99)

## 2023-04-29 MED ORDER — VANCOMYCIN HCL 125 MG PO CAPS
125.0000 mg | ORAL_CAPSULE | Freq: Four times a day (QID) | ORAL | Status: DC
  Administered 2023-04-29 – 2023-05-01 (×9): 125 mg via ORAL
  Filled 2023-04-29 (×10): qty 1

## 2023-04-29 MED ORDER — BANATROL TF EN LIQD
60.0000 mL | Freq: Two times a day (BID) | ENTERAL | Status: DC
  Administered 2023-04-29 – 2023-05-01 (×4): 60 mL via ORAL
  Filled 2023-04-29 (×4): qty 60

## 2023-04-29 MED ORDER — BANATROL TF EN LIQD
60.0000 mL | Freq: Two times a day (BID) | ENTERAL | Status: DC
  Administered 2023-04-29: 60 mL
  Filled 2023-04-29 (×2): qty 60

## 2023-04-29 MED ORDER — ENOXAPARIN SODIUM 60 MG/0.6ML IJ SOSY
1.0000 mg/kg | PREFILLED_SYRINGE | Freq: Once | INTRAMUSCULAR | Status: AC
  Administered 2023-04-29: 57.5 mg via SUBCUTANEOUS
  Filled 2023-04-29: qty 0.6

## 2023-04-29 NOTE — Plan of Care (Signed)
Reinforce education. Monitor labs and vital signs. Monitor skin for breakdown. Manage diarrhea; maintain rectal pouch. Peri/foley care each shift and prn. Monitor intake and output.

## 2023-04-29 NOTE — Telephone Encounter (Signed)
Pharmacy Patient Advocate Encounter   Received notification that prior authorization for Zinplava 1000MG /40ML solution is required/requested.   Insurance verification completed.   The patient is insured through [employer] .   Per test claim: PA required; PA submitted to [facility] via CoverMyMeds Key/confirmation #/EOC [id number] Status is pending

## 2023-04-29 NOTE — Progress Notes (Signed)
PT Cancellation Note  Patient Details Name: Jodi Valdez MRN: [id number] DOB: March 15, 1936   Cancelled Treatment:    Reason Eval/Treat Not Completed: Fatigue/lethargy limiting ability to participate Per notes today, "has rectal tube still in place, she is very afraid to have this taken out due to fecal incontinence."  RN reports pt wishes to rest today, not mobilize.  Will check back as schedule permits.   Janan Halter [place] 04/29/2023, 10:49 AM Paulino Door, DPT Physical Therapist Acute Rehabilitation Services Office: [phone]

## 2023-04-29 NOTE — Progress Notes (Addendum)
Progress Note  Primary GI: Dr. Chales Abrahams DOA: 04/26/2023         Hospital Day: 4   Subjective  Chief Complaint: Cdiff colitis  No family was present at the time of my evaluation.  Patient states she lives in [place], husband Gabriel Rung is staying home today due to the weather. She states she still had 3 episodes of diarrhea last night, has rectal tube still in place, she is very afraid to have this taken out due to fecal incontinence.  She has had 1 more loose stools this morning. She does deny nausea, vomiting, fever, chills, abdominal pain. Currently on full liquid diet and she is interested in trying some food. Patient's main complaint is she said she feels very weak and extremely tired   Objective   Vital signs in last 24 hours: Temp:  [97.8 F (36.6 C)-98.6 F (37 C)] 98 F (36.7 C) (08/08 0450) Pulse Rate:  [80-85] 85 (08/08 0450) Resp:  [16-20] 17 (08/08 0450) BP: (111-140)/(55-64) 140/64 (08/08 0450) SpO2:  [96 %-98 %] 96 % (08/08 0450) Weight:  [59.7 kg] 59.7 kg (08/08 0447) Last BM Date : 04/28/23 Last BM recorded by nurses in past 5 days Stool Type: Type 7 (Liquid consistency with no solid pieces) (04/28/2023 10:00 PM)  General: Elderly, chronically ill appearing female. Heart:  Regular rate and rhythm; no murmurs Pulm: Clear anteriorly; no wheezing Abdomen:  Soft, Non-distended AB, Active bowel sounds. No tenderness . , Extremities:  without  edema. Neurologic:  Alert and  oriented x4;  No focal deficits.  Psych:  Cooperative. Normal mood and affect.  Intake/Output from previous day: 08/07 0701 - 08/08 0700 In: 91.3 [I.V.:91.3] Out: 1500 [Urine:1400; Stool:100] Intake/Output this shift: No intake/output data recorded.  Studies/Results: ECHOCARDIOGRAM LIMITED  Result Date: 04/27/2023    ECHOCARDIOGRAM LIMITED REPORT   Patient Name:   Jodi Valdez Date of Exam: 04/27/2023 Medical Rec #:  [id number]             Height:       64.0 in Accession #:    [id number]             Weight:       120.0 lb Date of Birth:  [birth year]            BSA:          1.575 m Patient Age:    86 years              BP:           105/92 mmHg Patient Gender: F                     HR:           84 bpm. Exam Location:  Inpatient Procedure: Limited Echo, Cardiac Doppler and Color Doppler Indications:    I26.02 Pulmonary embolus  History:        Patient has prior history of Echocardiogram examinations, most                 recent 11/23/2022. Risk Factors:Hypertension.  Sonographer:    Darlys Gales Referring Phys: [id number] SUBRINA SUNDIL IMPRESSIONS  1. Left ventricular ejection fraction, by estimation, is 60 to 65%. The left ventricle has normal function. The left ventricle has no regional wall motion abnormalities. Left ventricular diastolic parameters are consistent with Grade I diastolic dysfunction (impaired relaxation).  2. Right ventricular systolic function is normal. The right ventricular size is  normal.  3. The mitral valve is normal in structure. No evidence of mitral valve regurgitation. Mild mitral stenosis. The mean mitral valve gradient is 5.7 mmHg. Moderate mitral annular calcification.  4. The aortic valve is tricuspid. There is mild calcification of the aortic valve. There is mild thickening of the aortic valve. Aortic valve regurgitation is not visualized. No aortic stenosis is present.  5. The inferior vena cava is dilated in size with >50% respiratory variability, suggesting right atrial pressure of 8 mmHg. FINDINGS  Left Ventricle: Left ventricular ejection fraction, by estimation, is 60 to 65%. The left ventricle has normal function. The left ventricle has no regional wall motion abnormalities. The left ventricular internal cavity size was normal in size. There is  no left ventricular hypertrophy. Left ventricular diastolic parameters are consistent with Grade I diastolic dysfunction (impaired relaxation). Right Ventricle: The right ventricular size is normal. No increase in right  ventricular wall thickness. Right ventricular systolic function is normal. Left Atrium: Left atrial size was normal in size. Right Atrium: Right atrial size was normal in size. Pericardium: There is no evidence of pericardial effusion. Mitral Valve: The mitral valve is normal in structure. There is moderate thickening of the mitral valve leaflet(s). There is moderate calcification of the mitral valve leaflet(s). Moderate mitral annular calcification. Mild mitral valve stenosis. MV peak  gradient, 12.2 mmHg. The mean mitral valve gradient is 5.7 mmHg with average heart rate of 85 bpm. Tricuspid Valve: The tricuspid valve is normal in structure. Tricuspid valve regurgitation is not demonstrated. No evidence of tricuspid stenosis. Aortic Valve: The aortic valve is tricuspid. There is mild calcification of the aortic valve. There is mild thickening of the aortic valve. Aortic valve regurgitation is not visualized. No aortic stenosis is present. Pulmonic Valve: The pulmonic valve was normal in structure. Pulmonic valve regurgitation is not visualized. No evidence of pulmonic stenosis. Aorta: The aortic root is normal in size and structure. Venous: The inferior vena cava is dilated in size with greater than 50% respiratory variability, suggesting right atrial pressure of 8 mmHg. IAS/Shunts: No atrial level shunt detected by color flow Doppler. LEFT VENTRICLE PLAX 2D LVIDd:         3.90 cm LVIDs:         2.80 cm LV PW:         0.90 cm LV IVS:        1.00 cm LVOT diam:     1.90 cm LVOT Area:     2.84 cm  IVC IVC diam: 2.00 cm LEFT ATRIUM             Index        RIGHT ATRIUM           Index LA Vol (A2C):   39.4 ml 25.02 ml/m  RA Area:     11.10 cm LA Vol (A4C):   35.0 ml 22.23 ml/m  RA Volume:   24.40 ml  15.50 ml/m LA Biplane Vol: 37.3 ml 23.69 ml/m  MITRAL VALVE MV Peak grad: 12.2 mmHg  SHUNTS MV Mean grad: 5.7 mmHg   Systemic Diam: 1.90 cm MV Vmax:      1.75 m/s MV Vmean:     114.7 cm/s Chilton Si MD  Electronically signed by Chilton Si MD Signature Date/Time: 04/27/2023/11:47:38 AM    Final     Lab Results: Recent Labs    04/27/23 0630 04/28/23 0415 04/29/23 0441  WBC 12.3* 7.9 6.4  HGB 11.8* 10.3* 10.1*  HCT  35.5* 31.2* 30.5*  PLT 162 141* 170   BMET Recent Labs    04/27/23 0521 04/28/23 0415 04/29/23 0441  NA 133* 136 137  K 3.6 3.5 4.9  CL 106 107 110  CO2 17* 20* 22  GLUCOSE 127* 113* 87  BUN 11 11 9   CREATININE 0.68 0.78 0.71  CALCIUM 7.3* 7.3* 7.9*   LFT Recent Labs    04/29/23 0441  PROT 4.7*  ALBUMIN 2.4*  AST 11*  ALT 17  ALKPHOS 32*  BILITOT 0.7   PT/INR No results for input(s): "LABPROT", "INR" in the last 72 hours.   Scheduled Meds:  amLODipine  5 mg Oral BID   carvedilol  6.25 mg Oral BID WC   Chlorhexidine Gluconate Cloth  6 each Topical Daily   cholecalciferol  5,000 Units Oral BID   cholestyramine light  4 g Oral BID AC   isosorbide mononitrate  30 mg Oral Daily   levothyroxine  88 mcg Oral Q0600   losartan  50 mg Oral Daily   pantoprazole  40 mg Oral BID AC   pravastatin  20 mg Oral Daily   predniSONE  8 mg Oral Q breakfast   sodium chloride flush  3 mL Intravenous Q12H   traZODone  150 mg Oral QHS   vancomycin  125 mg Oral QID   Followed by   Melene Muller ON 05/12/2023] vancomycin  125 mg Oral BID   Followed by   Melene Muller ON 05/20/2023] vancomycin  125 mg Oral Daily   Followed by   Melene Muller ON 05/27/2023] vancomycin  125 mg Oral QODAY   Followed by   Melene Muller ON 06/04/2023] vancomycin  125 mg Oral Q3 days   Continuous Infusions:  sodium chloride         Impression/Plan:   C.difficile proctocolitis History of recent C. difficile colitis on Vanco taper started around 02/24/2023 for 6 weeks,   7/29 continued with diarrhea recheck C. difficile antigen which is negative, started on Cholestyramine 4 g twice daily and Imodium Negative GI pathogen continued diarrhea, C. difficile PCR and toxin positive 08/07 Pending fecal  calprotectin Started on vancomycin tape 08/07 -Infectious diseases involved with the patient, switched from Dificid to vancomycin taper which are hoping for 6 weeks. -Patient high risk for complications and reoccurence due to age, ID will submit forms for eval's and start the program closer to initiation. -Continue supportive measures -Will add on banatrol and increase diet to soft heart healthy diet -Patient is currently very anxious about taking out rectal tube, will let her have breakfast this morning and plan on removing after that in order to better to monitor response to medications  New incidental finding of pulmonary embolism:  Patient started on IV heparin drip this hospitalization  Grade 1 diastolic heart failure preserved EF  COVID-19 long-hauler with shortness of breath:  Also on oxygen as needed at home  GERD:  Continued on Protonix 40 mg twice daily  History of breast cancer:  Status post chemo/radiation/mastectomy of the left breast in 2022  Polymyalgia rheumatica:  Currently on Prednisone 8 mg daily  Pancreatic insufficiency and pancreatic cyst:  Continues on Cholestyramine 4 g twice daily, CT abdomen shows known pancreatic cyst slowly enlarging in size suspicious for IPMN  Failure to thrive/debilitation Patient will likely benefit from physical therapy inpatient or outpatient for prolonged hospitalizations and C. difficile  Principal Problem:   Proctocolitis Active Problems:   Essential hypertension   COVID-19 long hauler manifesting chronic dyspnea   Polymyalgia (  HCC)   Hyperlipidemia   Concern for C. difficile colitis   SIRS (systemic inflammatory response syndrome) (HCC)   Hypertensive crisis   Hyponatremia   Hypokalemia   Hypomagnesemia   Diarrhea   Fall   Pancreatic insufficiency   Pancreatic cyst   Insomnia   Hashimoto's thyroiditis   Recurrent Clostridioides difficile diarrhea   Diarrhea, infectious, adult   Abnormal CT scan,  gastrointestinal tract    LOS: 3 days   Doree Albee  04/29/2023, 8:41 AM

## 2023-04-29 NOTE — Telephone Encounter (Signed)
Pharmacy Patient Advocate Encounter  Received notification from [facility] that Prior Authorization for Zinplava 1000MG /40ML solution  has been APPROVED from 04/29/2023 to 09/21/2023. Ran test claim, Copay is $100.00. This test claim was processed through [facility]- copay amounts may vary at other pharmacies due to pharmacy/plan contracts, or as the patient moves through the different stages of their insurance plan.   PA #/Case ID/Reference #: [id number]

## 2023-04-29 NOTE — Progress Notes (Signed)
[facility] PROGRESS NOTE    Progress Note  Jodi Valdez  EAV:[id number] DOB: 1935-11-08 DOA: 04/26/2023 PCP: Ailene Ravel, MD     Brief Narrative:   Jodi Valdez is an 87 y.o. female past medical history breast cancer status post chemotherapy radiation and meniscectomy, chronic diastolic heart failure, Hashimoto disease long COVID syndrome, polymyalgia rheumatica presented secondary to diarrhea weakness and near syncope.  Found to have C. difficile colitis   Assessment/Plan:   Infectious proctocolitis due to recurrent C. difficile colitis: CT scan of the abdomen pelvis showed inflammatory process of the colon [facility] was consulted. Blood cultures negative on admission. Consulted ID who recommended to switch to vancomycin for 14 days and they will follow-up with Bezlotoxumab  infusion followed by fecal transplant outpatient . Appreciate GIs assistance they are on board. Discontinue rectal tube.  Sepsis: Likely due to C. difficile colitis, blood cultures on admission were negative now resolved.  Chronic pulmonary embolism: Discussed the case with hematology and due to the nonspecific findings on CTA they recommended to hold anticoagulation and they will follow-up with her as an outpatient. Her regular oncologist is Dr. Truett Perna  Essential hypertension Continue Coreg Imdur losartan amlodipine blood pressure is well-controlled.  Chronic diastolic heart failure: Currently stable continue losartan and Coreg.  Hypovolemic hyponatremia: Resolved with IV fluids.  Hypokalemia: Likely due to diarrhea repleted now resolved.  Try to give potassium greater than 4.  Hypomagnesemia: Repleted now improved.  History of fall: PT OT has been consulted awaiting evaluation.  Hashimoto thyroiditis: Continue Synthroid.  Osteoporosis: Continue current home medications.  GERD: Continue Protonix.  Hyperlipidemia: Continue statins.  Insomnia: Continue  trazodone.  History of left breast cancer on Noted.  Polymyalgia rheumatica: Continue steroids.  Pancreatic insufficiency: Continue cholestyramine.    DVT prophylaxis: lovenox Family Communication:none Status is: Inpatient Remains inpatient appropriate because: Recurrent C. difficile colitis    Code Status:     Code Status Orders  (From admission, onward)           Start     Ordered   04/26/23 2317  Full code  Continuous       Question:  By:  Answer:  Consent: discussion documented in EHR   04/26/23 2319           Code Status History     Date Active Date Inactive Code Status Order ID Comments User Context   02/24/2023 1628 02/26/2023 1947 Full Code [id number]  Lorin Glass, MD ED         IV Access:   Peripheral IV   Procedures and diagnostic studies:   ECHOCARDIOGRAM LIMITED  Result Date: 04/27/2023    ECHOCARDIOGRAM LIMITED REPORT   Patient Name:   Jodi Valdez Date of Exam: 04/27/2023 Medical Rec #:  [id number]             Height:       64.0 in Accession #:    [id number]            Weight:       120.0 lb Date of Birth:  Mar 27, 1936            BSA:          1.575 m Patient Age:    86 years              BP:           105/92 mmHg Patient Gender: F  HR:           84 bpm. Exam Location:  Inpatient Procedure: Limited Echo, Cardiac Doppler and Color Doppler Indications:    I26.02 Pulmonary embolus  History:        Patient has prior history of Echocardiogram examinations, most                 recent 11/23/2022. Risk Factors:Hypertension.  Sonographer:    Darlys Gales Referring Phys: [id number] SUBRINA SUNDIL IMPRESSIONS  1. Left ventricular ejection fraction, by estimation, is 60 to 65%. The left ventricle has normal function. The left ventricle has no regional wall motion abnormalities. Left ventricular diastolic parameters are consistent with Grade I diastolic dysfunction (impaired relaxation).  2. Right ventricular systolic function is normal. The  right ventricular size is normal.  3. The mitral valve is normal in structure. No evidence of mitral valve regurgitation. Mild mitral stenosis. The mean mitral valve gradient is 5.7 mmHg. Moderate mitral annular calcification.  4. The aortic valve is tricuspid. There is mild calcification of the aortic valve. There is mild thickening of the aortic valve. Aortic valve regurgitation is not visualized. No aortic stenosis is present.  5. The inferior vena cava is dilated in size with >50% respiratory variability, suggesting right atrial pressure of 8 mmHg. FINDINGS  Left Ventricle: Left ventricular ejection fraction, by estimation, is 60 to 65%. The left ventricle has normal function. The left ventricle has no regional wall motion abnormalities. The left ventricular internal cavity size was normal in size. There is  no left ventricular hypertrophy. Left ventricular diastolic parameters are consistent with Grade I diastolic dysfunction (impaired relaxation). Right Ventricle: The right ventricular size is normal. No increase in right ventricular wall thickness. Right ventricular systolic function is normal. Left Atrium: Left atrial size was normal in size. Right Atrium: Right atrial size was normal in size. Pericardium: There is no evidence of pericardial effusion. Mitral Valve: The mitral valve is normal in structure. There is moderate thickening of the mitral valve leaflet(s). There is moderate calcification of the mitral valve leaflet(s). Moderate mitral annular calcification. Mild mitral valve stenosis. MV peak  gradient, 12.2 mmHg. The mean mitral valve gradient is 5.7 mmHg with average heart rate of 85 bpm. Tricuspid Valve: The tricuspid valve is normal in structure. Tricuspid valve regurgitation is not demonstrated. No evidence of tricuspid stenosis. Aortic Valve: The aortic valve is tricuspid. There is mild calcification of the aortic valve. There is mild thickening of the aortic valve. Aortic valve regurgitation  is not visualized. No aortic stenosis is present. Pulmonic Valve: The pulmonic valve was normal in structure. Pulmonic valve regurgitation is not visualized. No evidence of pulmonic stenosis. Aorta: The aortic root is normal in size and structure. Venous: The inferior vena cava is dilated in size with greater than 50% respiratory variability, suggesting right atrial pressure of 8 mmHg. IAS/Shunts: No atrial level shunt detected by color flow Doppler. LEFT VENTRICLE PLAX 2D LVIDd:         3.90 cm LVIDs:         2.80 cm LV PW:         0.90 cm LV IVS:        1.00 cm LVOT diam:     1.90 cm LVOT Area:     2.84 cm  IVC IVC diam: 2.00 cm LEFT ATRIUM             Index        RIGHT ATRIUM  Index LA Vol (A2C):   39.4 ml 25.02 ml/m  RA Area:     11.10 cm LA Vol (A4C):   35.0 ml 22.23 ml/m  RA Volume:   24.40 ml  15.50 ml/m LA Biplane Vol: 37.3 ml 23.69 ml/m  MITRAL VALVE MV Peak grad: 12.2 mmHg  SHUNTS MV Mean grad: 5.7 mmHg   Systemic Diam: 1.90 cm MV Vmax:      1.75 m/s MV Vmean:     114.7 cm/s Chilton Si MD Electronically signed by Chilton Si MD Signature Date/Time: 04/27/2023/11:47:38 AM    Final      Medical Consultants:   None.   Subjective:    Jodi Valdez only 100 cc of stool came out.  Objective:    Vitals:   04/28/23 1507 04/28/23 2031 04/29/23 0447 04/29/23 0450  BP: (!) 111/55 131/62  (!) 140/64  Pulse: 80 82  85  Resp: 20 16  17   Temp: 97.8 F (36.6 C) 98.6 F (37 C)  98 F (36.7 C)  TempSrc: Oral   Oral  SpO2: 98% 96%  96%  Weight:   59.7 kg   Height:       SpO2: 96 %   Intake/Output Summary (Last 24 hours) at 04/29/2023 0903 Last data filed at 04/28/2023 2159 Gross per 24 hour  Intake 91.31 ml  Output 1500 ml  Net -1408.69 ml   Filed Weights   04/26/23 1531 04/28/23 0524 04/29/23 0447  Weight: 54.4 kg 58.4 kg 59.7 kg    Exam: General exam: In no acute distress. Respiratory system: Good air movement and clear to  auscultation. Cardiovascular system: S1 & S2 heard, RRR. No JVD. Gastrointestinal system: Abdomen is nondistended, soft and nontender.  Extremities: No pedal edema. Skin: No rashes, lesions or ulcers Psychiatry: Judgement and insight appear normal. Mood & affect appropriate.  Data Reviewed:    Labs: Basic Metabolic Panel: Recent Labs  Lab 04/26/23 1604 04/27/23 0521 04/28/23 0415 04/29/23 0441  NA 134* 133* 136 137  K 3.2* 3.6 3.5 4.9  CL 103 106 107 110  CO2 22 17* 20* 22  GLUCOSE 159* 127* 113* 87  BUN 18 11 11 9   CREATININE 0.91 0.68 0.78 0.71  CALCIUM 8.3* 7.3* 7.3* 7.9*  MG 1.6* 2.3  --   --    GFR Estimated Creatinine Clearance: 45.2 mL/min (by C-G formula based on SCr of 0.71 mg/dL). Liver Function Tests: Recent Labs  Lab 04/26/23 1604 04/27/23 0521 04/28/23 0415 04/29/23 0441  AST 16 19 12* 11*  ALT 28 21 17 17   ALKPHOS 41 39 34* 32*  BILITOT 0.7 0.6 0.8 0.7  PROT 5.8* 4.8* 4.4* 4.7*  ALBUMIN 3.1* 2.5* 2.2* 2.4*   No results for input(s): "LIPASE", "AMYLASE" in the last 168 hours. No results for input(s): "AMMONIA" in the last 168 hours. Coagulation profile No results for input(s): "INR", "PROTIME" in the last 168 hours. COVID-19 Labs  Recent Labs    04/26/23 1604 04/26/23 2045  DDIMER  --  3.76*  CRP 17.0*  --     Lab Results  Component Value Date   SARSCOV2NAA NEGATIVE 04/26/2023    CBC: Recent Labs  Lab 04/26/23 1604 04/27/23 0630 04/28/23 0415 04/29/23 0441  WBC 15.8* 12.3* 7.9 6.4  NEUTROABS  --  9.3* 5.3 2.6  HGB 13.6 11.8* 10.3* 10.1*  HCT 40.9 35.5* 31.2* 30.5*  MCV 96.0 96.5 97.5 96.2  PLT 185 162 141* 170   Cardiac Enzymes: Recent Labs  Lab 04/26/23 1604  CKTOTAL 58   BNP (last 3 results) Recent Labs    11/09/22 1501  PROBNP 550   CBG: Recent Labs  Lab 04/27/23 0209 04/27/23 0759 04/27/23 0905 04/29/23 0753  GLUCAP 132* 129* 127* 106*   D-Dimer: Recent Labs    04/26/23 2045  DDIMER 3.76*   Hgb  A1c: No results for input(s): "HGBA1C" in the last 72 hours. Lipid Profile: No results for input(s): "CHOL", "HDL", "LDLCALC", "TRIG", "CHOLHDL", "LDLDIRECT" in the last 72 hours. Thyroid function studies: Recent Labs    04/26/23 1604  TSH 0.523   Anemia work up: No results for input(s): "VITAMINB12", "FOLATE", "FERRITIN", "TIBC", "IRON", "RETICCTPCT" in the last 72 hours. Sepsis Labs: Recent Labs  Lab 04/26/23 1604 04/26/23 1611 04/27/23 0630 04/28/23 0415 04/29/23 0441  WBC 15.8*  --  12.3* 7.9 6.4  LATICACIDVEN  --  1.9  --   --   --    Microbiology Recent Results (from the past 240 hour(s))  Clostridium Difficile by PCR([facility] only )     Status: None   Collection Time: 04/19/23 11:11 AM   Specimen: STOOL   Stool  Result Value Ref Range Status   Toxigenic C. Difficile by PCR Negative Negative Final  Resp panel by RT-PCR (RSV, Flu A&B, Covid) Anterior Nasal Swab     Status: None   Collection Time: 04/26/23  4:04 PM   Specimen: Anterior Nasal Swab  Result Value Ref Range Status   SARS Coronavirus 2 by RT PCR NEGATIVE NEGATIVE Final    Comment: (NOTE) SARS-CoV-2 target nucleic acids are NOT DETECTED.  The SARS-CoV-2 RNA is generally detectable in upper respiratory specimens during the acute phase of infection. The lowest concentration of SARS-CoV-2 viral copies this assay can detect is 138 copies/mL. A negative result does not preclude SARS-Cov-2 infection and should not be used as the sole basis for treatment or other patient management decisions. A negative result may occur with  improper specimen collection/handling, submission of specimen other than nasopharyngeal swab, presence of viral mutation(s) within the areas targeted by this assay, and inadequate number of viral copies(<138 copies/mL). A negative result must be combined with clinical observations, patient history, and epidemiological information. The expected result is Negative.  Fact Sheet for  Patients:  BloggerCourse.com  Fact Sheet for Healthcare Providers:  SeriousBroker.it  This test is no t yet approved or cleared by the [place] FDA and  has been authorized for detection and/or diagnosis of SARS-CoV-2 by FDA under an Emergency Use Authorization (EUA). This EUA will remain  in effect (meaning this test can be used) for the duration of the COVID-19 declaration under Section 564(b)(1) of the Act, 21 U.S.C.section 360bbb-3(b)(1), unless the authorization is terminated  or revoked sooner.       Influenza A by PCR NEGATIVE NEGATIVE Final   Influenza B by PCR NEGATIVE NEGATIVE Final    Comment: (NOTE) The Xpert Xpress SARS-CoV-2/FLU/RSV plus assay is intended as an aid in the diagnosis of influenza from Nasopharyngeal swab specimens and should not be used as a sole basis for treatment. Nasal washings and aspirates are unacceptable for Xpert Xpress SARS-CoV-2/FLU/RSV testing.  Fact Sheet for Patients: BloggerCourse.com  Fact Sheet for Healthcare Providers: SeriousBroker.it  This test is not yet approved or cleared by the [place] FDA and has been authorized for detection and/or diagnosis of SARS-CoV-2 by FDA under an Emergency Use Authorization (EUA). This EUA will remain in effect (meaning this test can be used) for the  duration of the COVID-19 declaration under Section 564(b)(1) of the Act, 21 U.S.C. section 360bbb-3(b)(1), unless the authorization is terminated or revoked.     Resp Syncytial Virus by PCR NEGATIVE NEGATIVE Final    Comment: (NOTE) Fact Sheet for Patients: BloggerCourse.com  Fact Sheet for Healthcare Providers: SeriousBroker.it  This test is not yet approved or cleared by the [place] FDA and has been authorized for detection and/or diagnosis of SARS-CoV-2 by FDA under an Emergency  Use Authorization (EUA). This EUA will remain in effect (meaning this test can be used) for the duration of the COVID-19 declaration under Section 564(b)(1) of the Act, 21 U.S.C. section 360bbb-3(b)(1), unless the authorization is terminated or revoked.  Performed at [facility], [address], [place], [place] [address]   C Difficile Quick Screen w PCR reflex     Status: Abnormal   Collection Time: 04/27/23  1:04 AM   Specimen: STOOL  Result Value Ref Range Status   C Diff antigen POSITIVE (A) NEGATIVE Final   C Diff toxin POSITIVE (A) NEGATIVE Final   C Diff interpretation Toxin producing C. difficile detected.  Final    Comment: CRITICAL RESULT CALLED TO, READ BACK BY AND VERIFIED WITH: Jerilee Hoh RN @ [phone] ON 04/27/2023 BY ABDULHALIM,M Performed at [facility], [address], [place], [place] [address]   Gastrointestinal Panel by PCR , Stool     Status: None   Collection Time: 04/27/23  1:04 AM   Specimen: STOOL  Result Value Ref Range Status   Campylobacter species NOT DETECTED NOT DETECTED Final   Plesimonas shigelloides NOT DETECTED NOT DETECTED Final   Salmonella species NOT DETECTED NOT DETECTED Final   Yersinia enterocolitica NOT DETECTED NOT DETECTED Final   Vibrio species NOT DETECTED NOT DETECTED Final   Vibrio cholerae NOT DETECTED NOT DETECTED Final   Enteroaggregative E coli (EAEC) NOT DETECTED NOT DETECTED Final   Enteropathogenic E coli (EPEC) NOT DETECTED NOT DETECTED Final   Enterotoxigenic E coli (ETEC) NOT DETECTED NOT DETECTED Final   Shiga like toxin producing E coli (STEC) NOT DETECTED NOT DETECTED Final   Shigella/Enteroinvasive E coli (EIEC) NOT DETECTED NOT DETECTED Final   Cryptosporidium NOT DETECTED NOT DETECTED Final   Cyclospora cayetanensis NOT DETECTED NOT DETECTED Final   Entamoeba histolytica NOT DETECTED NOT DETECTED Final   Giardia lamblia NOT DETECTED NOT DETECTED Final   Adenovirus F40/41 NOT DETECTED  NOT DETECTED Final   Astrovirus NOT DETECTED NOT DETECTED Final   Norovirus GI/GII NOT DETECTED NOT DETECTED Final   Rotavirus A NOT DETECTED NOT DETECTED Final   Sapovirus (I, II, IV, and V) NOT DETECTED NOT DETECTED Final    Comment: Performed at [facility], [address] [place], [place] [address]     Medications:    amLODipine  5 mg Oral BID   carvedilol  6.25 mg Oral BID WC   Chlorhexidine Gluconate Cloth  6 each Topical Daily   cholecalciferol  5,000 Units Oral BID   cholestyramine light  4 g Oral BID AC   isosorbide mononitrate  30 mg Oral Daily   levothyroxine  88 mcg Oral Q0600   losartan  50 mg Oral Daily   pantoprazole  40 mg Oral BID AC   pravastatin  20 mg Oral Daily   predniSONE  8 mg Oral Q breakfast   sodium chloride flush  3 mL Intravenous Q12H   traZODone  150 mg Oral QHS   vancomycin  125 mg Oral QID   Followed by   Melene Muller ON 05/12/2023] vancomycin  125 mg Oral BID   Followed by   Melene Muller ON 05/20/2023] vancomycin  125 mg Oral Daily   Followed by   Melene Muller ON 05/27/2023] vancomycin  125 mg Oral QODAY   Followed by   Melene Muller ON 06/04/2023] vancomycin  125 mg Oral Q3 days   Continuous Infusions:  sodium chloride        LOS: 3 days   Marinda Elk  [facility]  04/29/2023, 9:03 AM

## 2023-04-30 ENCOUNTER — Other Ambulatory Visit (HOSPITAL_COMMUNITY): Payer: Self-pay

## 2023-04-30 DIAGNOSIS — R933 Abnormal findings on diagnostic imaging of other parts of digestive tract: Secondary | ICD-10-CM | POA: Diagnosis not present

## 2023-04-30 DIAGNOSIS — A0471 Enterocolitis due to Clostridium difficile, recurrent: Secondary | ICD-10-CM | POA: Diagnosis not present

## 2023-04-30 DIAGNOSIS — A09 Infectious gastroenteritis and colitis, unspecified: Secondary | ICD-10-CM | POA: Diagnosis not present

## 2023-04-30 LAB — GLUCOSE, CAPILLARY: Glucose-Capillary: 99 mg/dL (ref 70–99)

## 2023-04-30 MED ORDER — FIDAXOMICIN 200 MG PO TABS: 200.0000 mg | ORAL_TABLET | ORAL | Status: DC

## 2023-04-30 MED ORDER — FIDAXOMICIN 200 MG PO TABS: 200.0000 mg | ORAL_TABLET | Freq: Two times a day (BID) | ORAL | Status: DC

## 2023-04-30 NOTE — Evaluation (Signed)
Physical Therapy Evaluation Patient Details Name: LEROY GIDEON MRN: [id number] DOB: 1936-03-27 Today's Date: 04/30/2023  History of Present Illness  Mrs. Brattain is a 87 yr old female admitted to the hospital 04-26-23 with loss stools, generalized weakness, and a near fall. PMH: polymyalgia rheumatica, breast CA s/p chemo/radiation, HTN, HLD, diastolic heart failure, COVID long hauler, recent hospitalization due to c.diff  Clinical Impression  Pt admitted with above diagnosis. Pt ambulated 24' with RW with no loss of balance. At baseline she ambulates household distances with a walker. She has 24 hour assist from her spouse at home. She is mobilizing well enough to DC home.  Pt currently with functional limitations due to the deficits listed below (see PT Problem List). Pt will benefit from acute skilled PT to increase their independence and safety with mobility to allow discharge.           If plan is discharge home, recommend the following: A little help with bathing/dressing/bathroom;Assistance with cooking/housework;Assist for transportation;Help with stairs or ramp for entrance   Can travel by private vehicle        Equipment Recommendations None recommended by PT  Recommendations for Other Services       Functional Status Assessment Patient has had a recent decline in their functional status and demonstrates the ability to make significant improvements in function in a reasonable and predictable amount of time.     Precautions / Restrictions Precautions Precautions: Fall Precaution Comments: enteric precautions Restrictions Weight Bearing Restrictions: No      Mobility  Bed Mobility Overal bed mobility: Modified Independent Bed Mobility: Supine to Sit     Supine to sit: Modified independent (Device/Increase time)          Transfers Overall transfer level: Needs assistance Equipment used: Rolling walker (2 wheels) Transfers: Sit to/from Stand, Bed to  chair/wheelchair/BSC Sit to Stand: Contact guard assist   Step pivot transfers: Contact guard assist       General transfer comment: transferred from bed to bedside commode with RW    Ambulation/Gait Ambulation/Gait assistance: Contact guard assist Gait Distance (Feet): 24 Feet Assistive device: Rolling walker (2 wheels) Gait Pattern/deviations: Decreased stride length, Wide base of support, Step-through pattern Gait velocity: decr     General Gait Details: steady, no loss of balance  Stairs            Wheelchair Mobility     Tilt Bed    Modified Rankin (Stroke Patients Only)       Balance Overall balance assessment: Needs assistance   Sitting balance-Leahy Scale: Good     Standing balance support: Bilateral upper extremity supported, Reliant on assistive device for balance, During functional activity Standing balance-Leahy Scale: Fair Standing balance comment: SBA to min guard with RW                             Pertinent Vitals/Pain Pain Assessment Pain Assessment: No/denies pain    Home Living Family/patient expects to be discharged to:: Private residence Living Arrangements: Spouse/significant other Available Help at Discharge: Family;Available 24 hours/day Type of Home: House Home Access: Stairs to enter Entrance Stairs-Rails: Can reach both Entrance Stairs-Number of Steps: 3   Home Layout: One level Home Equipment: Pharmacist, hospital (2 wheels);Wheelchair - manual;Cane - single point;BSC/3in1 Additional CommentsEmergency planning/management officer.    Prior Function Prior Level of Function : Needs assist             Mobility  Comments: Uses her RW for ambulation. Reports a previous RT knee injury ~ 1 year ago which affects her gait. Denies falls. Spouse always present for stairs. Pt reports she's mostly been home bound for the past couple months. ADLs Comments: The pt reports managing ADLs without the need for assistance; she takes  spongebaths for bathing. She has a friend who assists with bringing meals and her spouse performs most of the cleaning.     Extremity/Trunk Assessment   Upper Extremity Assessment Upper Extremity Assessment: Defer to OT evaluation    Lower Extremity Assessment Lower Extremity Assessment: RLE deficits/detail RLE Deficits / Details: prior injury to R knee ~1 year ago, knee ext 4/5, reports R knee buckles sometimes but she's not had a fall in past 6 months RLE Sensation: WNL    Cervical / Trunk Assessment Cervical / Trunk Assessment: Normal  Communication   Communication Communication: No apparent difficulties  Cognition Arousal: Alert Behavior During Therapy: WFL for tasks assessed/performed Overall Cognitive Status: Within Functional Limits for tasks assessed                                 General Comments: Oriented to person, place, and situation.  Able to follow 1 step commands without difficulty. She reports intermittent difficulty with her memory and cognition, due to having lingering effects from having COVID 2 years ago        General Comments      Exercises     Assessment/Plan    PT Assessment Patient needs continued PT services  PT Problem List Decreased activity tolerance;Decreased mobility       PT Treatment Interventions Gait training;Therapeutic exercise;Therapeutic activities    PT Goals (Current goals can be found in the Care Plan section)  Acute Rehab PT Goals Patient Stated Goal: walk farther PT Goal Formulation: With patient/family Time For Goal Achievement: 05/14/23 Potential to Achieve Goals: Good    Frequency Min 1X/week     Co-evaluation               AM-PAC PT "6 Clicks" Mobility  Outcome Measure Help needed turning from your back to your side while in a flat bed without using bedrails?: A Little Help needed moving from lying on your back to sitting on the side of a flat bed without using bedrails?: A Little Help  needed moving to and from a bed to a chair (including a wheelchair)?: A Little Help needed standing up from a chair using your arms (e.g., wheelchair or bedside chair)?: A Little Help needed to walk in hospital room?: A Little Help needed climbing 3-5 steps with a railing? : A Little 6 Click Score: 18    End of Session Equipment Utilized During Treatment: Gait belt Activity Tolerance: Patient tolerated treatment well Patient left: in bed;with call bell/phone within reach;with bed alarm set;with family/visitor present Nurse Communication: Mobility status PT Visit Diagnosis: Other abnormalities of gait and mobility (R26.89);Difficulty in walking, not elsewhere classified (R26.2)    Time: 4098-1191 PT Time Calculation (min) (ACUTE ONLY): 23 min   Charges:   PT Evaluation $PT Eval Moderate Complexity: 1 Mod PT Treatments $Gait Training: 8-22 mins PT General Charges $$ ACUTE PT VISIT: 1 Visit         Tamala Ser PT 04/30/2023  Acute Rehabilitation Services  Office [phone]

## 2023-04-30 NOTE — Progress Notes (Signed)
[facility] PROGRESS NOTE    Progress Note  Jodi Valdez  WGN:[id number] DOB: 02-20-1936 DOA: 04/26/2023 PCP: Ailene Ravel, MD     Brief Narrative:   Jodi Valdez is an 87 y.o. female past medical history breast cancer status post chemotherapy radiation and meniscectomy, chronic diastolic heart failure, Hashimoto disease long COVID syndrome, polymyalgia rheumatica presented secondary to diarrhea weakness and near syncope.  Found to have C. difficile colitis   Assessment/Plan:   Infectious proctocolitis due to recurrent C. difficile colitis: CT scan of the abdomen pelvis showed inflammatory process of the colon [facility] was consulted. Blood cultures negative on admission. Consulted ID who recommended to switch to vancomycin for 14 days and they will follow-up with Bezlotoxumab  infusion followed by fecal transplant outpatient . No bowel movements overnight or today. She feels much better was to get out of bed and start working with physical therapy.  Sepsis: Likely due to C. difficile colitis, blood cultures on admission were negative now resolved.  Chronic pulmonary embolism: Discussed the case with hematology and due to the nonspecific findings on CTA they recommended to hold anticoagulation and they will follow-up with her as an outpatient. Her regular oncologist is Dr. Truett Perna  Essential hypertension Continue Coreg Imdur losartan amlodipine blood pressure is well-controlled.  Chronic diastolic heart failure: Currently stable continue losartan and Coreg.  Hypovolemic hyponatremia: Resolved with IV fluids.  Hypokalemia: Likely due to diarrhea repleted now resolved.  Try to give potassium greater than 4.  Hypomagnesemia: Repleted now improved.  History of fall: PT OT has been consulted awaiting evaluation.  Hashimoto thyroiditis: Continue Synthroid.  Osteoporosis: Continue current home medications.  GERD: Continue  Protonix.  Hyperlipidemia: Continue statins.  Insomnia: Continue trazodone.  History of left breast cancer on Noted.  Polymyalgia rheumatica: Continue steroids.  Pancreatic insufficiency: Continue cholestyramine.    DVT prophylaxis: lovenox Family Communication:none Status is: Inpatient Remains inpatient appropriate because: Recurrent C. difficile colitis    Code Status:     Code Status Orders  (From admission, onward)           Start     Ordered   04/26/23 2317  Full code  Continuous       Question:  By:  Answer:  Consent: discussion documented in EHR   04/26/23 2319           Code Status History     Date Active Date Inactive Code Status Order ID Comments User Context   02/24/2023 1628 02/26/2023 1947 Full Code [id number]  Lorin Glass, MD ED         IV Access:   Peripheral IV   Procedures and diagnostic studies:   No results found.   Medical Consultants:   None.   Subjective:    Jodi Valdez no bowel movements overnight or this morning.  Is in a very good mood this morning that she is getting better.  Objective:    Vitals:   04/29/23 1140 04/29/23 2100 04/30/23 0500 04/30/23 0603  BP: (!) 143/64 134/71  133/73  Pulse: 80 87  94  Resp: 16 20  20   Temp: 98.4 F (36.9 C) 98.4 F (36.9 C)  98.3 F (36.8 C)  TempSrc: Oral Oral  Oral  SpO2: 98% 96%  97%  Weight:   58.6 kg   Height:       SpO2: 97 %   Intake/Output Summary (Last 24 hours) at 04/30/2023 0945 Last data filed at 04/30/2023 0606 Gross per 24  hour  Intake 530 ml  Output 3075 ml  Net -2545 ml   Filed Weights   04/28/23 0524 04/29/23 0447 04/30/23 0500  Weight: 58.4 kg 59.7 kg 58.6 kg    Exam: General exam: In no acute distress. Respiratory system: Good air movement and clear to auscultation. Cardiovascular system: S1 & S2 heard, RRR. No JVD. Gastrointestinal system: Abdomen is nondistended, soft and nontender.  She extremities: No pedal  edema. Skin: No rashes, lesions or ulcers Psychiatry: Judgement and insight appear normal. Mood & affect appropriate.  Data Reviewed:    Labs: Basic Metabolic Panel: Recent Labs  Lab 04/26/23 1604 04/27/23 0521 04/28/23 0415 04/29/23 0441  NA 134* 133* 136 137  K 3.2* 3.6 3.5 4.9  CL 103 106 107 110  CO2 22 17* 20* 22  GLUCOSE 159* 127* 113* 87  BUN 18 11 11 9   CREATININE 0.91 0.68 0.78 0.71  CALCIUM 8.3* 7.3* 7.3* 7.9*  MG 1.6* 2.3  --   --    GFR Estimated Creatinine Clearance: 45.2 mL/min (by C-G formula based on SCr of 0.71 mg/dL). Liver Function Tests: Recent Labs  Lab 04/26/23 1604 04/27/23 0521 04/28/23 0415 04/29/23 0441  AST 16 19 12* 11*  ALT 28 21 17 17   ALKPHOS 41 39 34* 32*  BILITOT 0.7 0.6 0.8 0.7  PROT 5.8* 4.8* 4.4* 4.7*  ALBUMIN 3.1* 2.5* 2.2* 2.4*   No results for input(s): "LIPASE", "AMYLASE" in the last 168 hours. No results for input(s): "AMMONIA" in the last 168 hours. Coagulation profile No results for input(s): "INR", "PROTIME" in the last 168 hours. COVID-19 Labs  No results for input(s): "DDIMER", "FERRITIN", "LDH", "CRP" in the last 72 hours.   Lab Results  Component Value Date   SARSCOV2NAA NEGATIVE 04/26/2023    CBC: Recent Labs  Lab 04/26/23 1604 04/27/23 0630 04/28/23 0415 04/29/23 0441  WBC 15.8* 12.3* 7.9 6.4  NEUTROABS  --  9.3* 5.3 2.6  HGB 13.6 11.8* 10.3* 10.1*  HCT 40.9 35.5* 31.2* 30.5*  MCV 96.0 96.5 97.5 96.2  PLT 185 162 141* 170   Cardiac Enzymes: Recent Labs  Lab 04/26/23 1604  CKTOTAL 58   BNP (last 3 results) Recent Labs    11/09/22 1501  PROBNP 550   CBG: Recent Labs  Lab 04/27/23 0209 04/27/23 0759 04/27/23 0905 04/29/23 0753 04/30/23 0738  GLUCAP 132* 129* 127* 106* 99   D-Dimer: No results for input(s): "DDIMER" in the last 72 hours.  Hgb A1c: No results for input(s): "HGBA1C" in the last 72 hours. Lipid Profile: No results for input(s): "CHOL", "HDL", "LDLCALC", "TRIG",  "CHOLHDL", "LDLDIRECT" in the last 72 hours. Thyroid function studies: No results for input(s): "TSH", "T4TOTAL", "T3FREE", "THYROIDAB" in the last 72 hours.  Invalid input(s): "FREET3"  Anemia work up: No results for input(s): "VITAMINB12", "FOLATE", "FERRITIN", "TIBC", "IRON", "RETICCTPCT" in the last 72 hours. Sepsis Labs: Recent Labs  Lab 04/26/23 1604 04/26/23 1611 04/27/23 0630 04/28/23 0415 04/29/23 0441  WBC 15.8*  --  12.3* 7.9 6.4  LATICACIDVEN  --  1.9  --   --   --    Microbiology Recent Results (from the past 240 hour(s))  Resp panel by RT-PCR (RSV, Flu A&B, Covid) Anterior Nasal Swab     Status: None   Collection Time: 04/26/23  4:04 PM   Specimen: Anterior Nasal Swab  Result Value Ref Range Status   SARS Coronavirus 2 by RT PCR NEGATIVE NEGATIVE Final    Comment: (NOTE) SARS-CoV-2  target nucleic acids are NOT DETECTED.  The SARS-CoV-2 RNA is generally detectable in upper respiratory specimens during the acute phase of infection. The lowest concentration of SARS-CoV-2 viral copies this assay can detect is 138 copies/mL. A negative result does not preclude SARS-Cov-2 infection and should not be used as the sole basis for treatment or other patient management decisions. A negative result may occur with  improper specimen collection/handling, submission of specimen other than nasopharyngeal swab, presence of viral mutation(s) within the areas targeted by this assay, and inadequate number of viral copies(<138 copies/mL). A negative result must be combined with clinical observations, patient history, and epidemiological information. The expected result is Negative.  Fact Sheet for Patients:  BloggerCourse.com  Fact Sheet for Healthcare Providers:  SeriousBroker.it  This test is no t yet approved or cleared by the [place] FDA and  has been authorized for detection and/or diagnosis of SARS-CoV-2 by FDA  under an Emergency Use Authorization (EUA). This EUA will remain  in effect (meaning this test can be used) for the duration of the COVID-19 declaration under Section 564(b)(1) of the Act, 21 U.S.C.section 360bbb-3(b)(1), unless the authorization is terminated  or revoked sooner.       Influenza A by PCR NEGATIVE NEGATIVE Final   Influenza B by PCR NEGATIVE NEGATIVE Final    Comment: (NOTE) The Xpert Xpress SARS-CoV-2/FLU/RSV plus assay is intended as an aid in the diagnosis of influenza from Nasopharyngeal swab specimens and should not be used as a sole basis for treatment. Nasal washings and aspirates are unacceptable for Xpert Xpress SARS-CoV-2/FLU/RSV testing.  Fact Sheet for Patients: BloggerCourse.com  Fact Sheet for Healthcare Providers: SeriousBroker.it  This test is not yet approved or cleared by the [place] FDA and has been authorized for detection and/or diagnosis of SARS-CoV-2 by FDA under an Emergency Use Authorization (EUA). This EUA will remain in effect (meaning this test can be used) for the duration of the COVID-19 declaration under Section 564(b)(1) of the Act, 21 U.S.C. section 360bbb-3(b)(1), unless the authorization is terminated or revoked.     Resp Syncytial Virus by PCR NEGATIVE NEGATIVE Final    Comment: (NOTE) Fact Sheet for Patients: BloggerCourse.com  Fact Sheet for Healthcare Providers: SeriousBroker.it  This test is not yet approved or cleared by the [place] FDA and has been authorized for detection and/or diagnosis of SARS-CoV-2 by FDA under an Emergency Use Authorization (EUA). This EUA will remain in effect (meaning this test can be used) for the duration of the COVID-19 declaration under Section 564(b)(1) of the Act, 21 U.S.C. section 360bbb-3(b)(1), unless the authorization is terminated or revoked.  Performed at [facility], [address] [place], [place] [address]   C Difficile Quick Screen w PCR reflex     Status: Abnormal   Collection Time: 04/27/23  1:04 AM   Specimen: STOOL  Result Value Ref Range Status   C Diff antigen POSITIVE (A) NEGATIVE Final   C Diff toxin POSITIVE (A) NEGATIVE Final   C Diff interpretation Toxin producing C. difficile detected.  Final    Comment: CRITICAL RESULT CALLED TO, READ BACK BY AND VERIFIED WITH: Jerilee Hoh RN @ [phone] ON 04/27/2023 BY ABDULHALIM,M Performed at [facility], [address], [place], [place] [address]   Gastrointestinal Panel by PCR , Stool     Status: None   Collection Time: 04/27/23  1:04 AM   Specimen: STOOL  Result Value Ref Range Status   Campylobacter species  NOT DETECTED NOT DETECTED Final   Plesimonas shigelloides NOT DETECTED NOT DETECTED Final   Salmonella species NOT DETECTED NOT DETECTED Final   Yersinia enterocolitica NOT DETECTED NOT DETECTED Final   Vibrio species NOT DETECTED NOT DETECTED Final   Vibrio cholerae NOT DETECTED NOT DETECTED Final   Enteroaggregative E coli (EAEC) NOT DETECTED NOT DETECTED Final   Enteropathogenic E coli (EPEC) NOT DETECTED NOT DETECTED Final   Enterotoxigenic E coli (ETEC) NOT DETECTED NOT DETECTED Final   Shiga like toxin producing E coli (STEC) NOT DETECTED NOT DETECTED Final   Shigella/Enteroinvasive E coli (EIEC) NOT DETECTED NOT DETECTED Final   Cryptosporidium NOT DETECTED NOT DETECTED Final   Cyclospora cayetanensis NOT DETECTED NOT DETECTED Final   Entamoeba histolytica NOT DETECTED NOT DETECTED Final   Giardia lamblia NOT DETECTED NOT DETECTED Final   Adenovirus F40/41 NOT DETECTED NOT DETECTED Final   Astrovirus NOT DETECTED NOT DETECTED Final   Norovirus GI/GII NOT DETECTED NOT DETECTED Final   Rotavirus A NOT DETECTED NOT DETECTED Final   Sapovirus (I, II, IV, and V) NOT DETECTED NOT DETECTED Final    Comment: Performed at [facility], [address] [place], [place] [address]  OVA + PARASITE EXAM     Status: None   Collection Time: 04/27/23  1:04 AM   Specimen: STOOL  Result Value Ref Range Status   OVA + PARASITE EXAM Final report  Final    Comment: (NOTE) These results were obtained using wet preparation(s) and trichrome stained smear. This test does not include testing for Cryptosporidium parvum, Cyclospora, or Microsporidia. Performed At: [facility] [address] [place], [place] [id number] Jolene Schimke MD [id number]    Source of Sample STOOL  Final    Comment: Performed at [facility], [address], [place], [place] [address]  Calprotectin, Fecal     Status: Abnormal   Collection Time: 04/27/23  1:05 AM   Specimen: STOOL  Result Value Ref Range Status   Calprotectin, Fecal 1,550 (H) 0 - 120 ug/g Final    Comment: (NOTE) **Results verified by repeat testing** Concentration     Interpretation   Follow-Up < 5 - 50 ug/g     Normal           None >50 -120 ug/g     Borderline       Re-evaluate in 4-6 weeks    >120 ug/g     Abnormal         Repeat as clinically                                   indicated Performed At: [facility] [address] [place], [place] [id number] Jolene Schimke MD [id number]      Medications:    amLODipine  5 mg Oral BID   carvedilol  6.25 mg Oral BID WC   Chlorhexidine Gluconate Cloth  6 each Topical Daily   cholecalciferol  5,000 Units Oral BID   cholestyramine light  4 g Oral BID AC   fiber supplement (BANATROL TF)  60 mL Oral BID   isosorbide mononitrate  30 mg Oral Daily   levothyroxine  88 mcg Oral Q0600   losartan  50 mg Oral Daily   pantoprazole  40 mg Oral BID AC   pravastatin  20 mg Oral Daily   predniSONE  8 mg Oral Q breakfast   sodium  chloride flush  3 mL Intravenous Q12H   traZODone  150 mg Oral QHS   vancomycin  125 mg Oral QID   Continuous Infusions:  sodium chloride        LOS: 4 days   Marinda Elk  [facility]  04/30/2023, 9:45 AM

## 2023-04-30 NOTE — Progress Notes (Signed)
[facility]  Date of Admission:  04/26/2023   Total days of inpatient antibiotics 4  Principal Problem:   Proctocolitis Active Problems:   Essential hypertension   COVID-19 long hauler manifesting chronic dyspnea   Polymyalgia (HCC)   Hyperlipidemia   Concern for C. difficile colitis   SIRS (systemic inflammatory response syndrome) (HCC)   Hypertensive crisis   Hyponatremia   Hypokalemia   Hypomagnesemia   Diarrhea   Fall   Pancreatic insufficiency   Pancreatic cyst   Insomnia   Hashimoto's thyroiditis   Recurrent Clostridioides difficile diarrhea   Diarrhea, infectious, adult   Abnormal CT scan, gastrointestinal tract   Imaging of gastrointestinal tract abnormal   Recurrent colitis due to Clostridioides difficile          Assessment: 87 year old female with history of polymyalgia rheumatica, Hashimoto thyroiditis, recurrent C. difficile who relapsed and completed a taper of vancomycin x 6 weeks admitted with recurrent C. difficile colitis:   #Recurrent C. difficile - Testing history as below: - 01/06/2023 C. difficile testing positive treated with vancomycin x 10 days - Admitted 6/5-2 6/7 for C. difficile colitis, toxin and antigen positive initially did not improve Dificid.  Treated with vancomycin for 6-week treatment. - C. difficile testing including antigen/toxin /PCR negative on 7/29 started for.  Started on cholestyramine by GI. - Developed large-volume loose stools, C. difficile toxin on 04/27/2023 positive for antigen and toxin.  Started on Dificid did not improve.CT abdomen pelvis showed infectious or inflammatory proctocolitis is seen with ulcerative colitis/pseudomembranous colitis, no evidence of obstruction or perforation - Patient stated following her vancomycin taper, her stools were formed in a "pile".  Then day prior to admission she had large-volume watery stool.  No stools since yesterday.  Inquiring about  discharge. Recommendations: - Continue vancomycin 125mg  PO qid to complete 10 days then follow-up with Dificid taper with   200mg  every other day x 20 days.  Dificid taper would provide optimal timing for infusion and starting vitals.  Zinplava infusion at end of therapy on 9/5 at 10 AM.  Will see patient in clinic she has an appointment with me at 2 PM to start Vowst. - She will ultimately require colonoscopy down the road. ID will sign off  Microbiology:   Antibiotics: Vancomycin 8/7- Dificid 8/5-8/7    SUBJECTIVE: Patient reports she has not had a bowel movement since yesterday.  No new complaints.  Feels well. Interval: Afebrile overnight.  No bowel movements since yesterday AM.  Review of Systems: Review of Systems  All other systems reviewed and are negative.    Scheduled Meds:  amLODipine  5 mg Oral BID   carvedilol  6.25 mg Oral BID WC   Chlorhexidine Gluconate Cloth  6 each Topical Daily   cholecalciferol  5,000 Units Oral BID   cholestyramine light  4 g Oral BID AC   fiber supplement (BANATROL TF)  60 mL Oral BID   [START ON 05/13/2023] fidaxomicin  200 mg Oral BID   Followed by   Melene Muller ON 05/18/2023] fidaxomicin  200 mg Oral QODAY   isosorbide mononitrate  30 mg Oral Daily   levothyroxine  88 mcg Oral Q0600   losartan  50 mg Oral Daily   pantoprazole  40 mg Oral BID AC   pravastatin  20 mg Oral Daily   predniSONE  8 mg Oral Q breakfast   sodium chloride flush  3 mL Intravenous Q12H  traZODone  150 mg Oral QHS   vancomycin  125 mg Oral QID   Continuous Infusions:  sodium chloride     PRN Meds:.sodium chloride, acetaminophen **OR** acetaminophen, hydrALAZINE, ipratropium-albuterol, nitroGLYCERIN, ondansetron **OR** ondansetron (ZOFRAN) IV, sodium chloride flush Allergies  Allergen Reactions   Propranolol Other (See Comments)    Hallucinations    Gabapentin Other (See Comments)    Hallucinations   Metformin And Related Diarrhea   Codeine Nausea And  Vomiting   Nitrofuran Derivatives Nausea And Vomiting and Other (See Comments)    GI Intolerance    OBJECTIVE: Vitals:   04/29/23 2100 04/30/23 0500 04/30/23 0603 04/30/23 1259  BP: 134/71  133/73 118/62  Pulse: 87  94 78  Resp: 20  20   Temp: 98.4 F (36.9 C)  98.3 F (36.8 C) 98 F (36.7 C)  TempSrc: Oral  Oral Oral  SpO2: 96%  97% 97%  Weight:  58.6 kg    Height:       Body mass index is 22.18 kg/m.  Physical Exam Constitutional:      Appearance: Normal appearance.  HENT:     Head: Normocephalic and atraumatic.     Right Ear: Tympanic membrane normal.     Left Ear: Tympanic membrane normal.     Nose: Nose normal.     Mouth/Throat:     Mouth: Mucous membranes are moist.  Eyes:     Extraocular Movements: Extraocular movements intact.     Conjunctiva/sclera: Conjunctivae normal.     Pupils: Pupils are equal, round, and reactive to light.  Cardiovascular:     Rate and Rhythm: Normal rate and regular rhythm.     Heart sounds: No murmur heard.    No friction rub. No gallop.  Pulmonary:     Effort: Pulmonary effort is normal.     Breath sounds: Normal breath sounds.  Abdominal:     General: Abdomen is flat.     Palpations: Abdomen is soft.  Musculoskeletal:        General: Normal range of motion.  Skin:    General: Skin is warm and dry.  Neurological:     General: No focal deficit present.     Mental Status: She is alert and oriented to person, place, and time.  Psychiatric:        Mood and Affect: Mood normal.       Lab Results Lab Results  Component Value Date   WBC 6.4 04/29/2023   HGB 10.1 (L) 04/29/2023   HCT 30.5 (L) 04/29/2023   MCV 96.2 04/29/2023   PLT 170 04/29/2023    Lab Results  Component Value Date   CREATININE 0.71 04/29/2023   BUN 9 04/29/2023   NA 137 04/29/2023   K 4.9 04/29/2023   CL 110 04/29/2023   CO2 22 04/29/2023    Lab Results  Component Value Date   ALT 17 04/29/2023   AST 11 (L) 04/29/2023   ALKPHOS 32 (L)  04/29/2023   BILITOT 0.7 04/29/2023        Danelle Earthly, MD [facility] 04/30/2023, 2:12 PM   I have personally spent 54 minutes involved in face-to-face and non-face-to-face activities for this patient on the day of the visit. Professional time spent includes the following activities: Preparing to see the patient (review of tests), Obtaining and/or reviewing separately obtained history (admission/discharge record), Performing a medically appropriate examination and/or evaluation , Ordering medications/tests/procedures, referring and communicating with other health  care professionals, Documenting clinical information in the EMR, Independently interpreting results (not separately reported), Communicating results to the patient/family/caregiver, Counseling and educating the patient/family/caregiver and Care coordination (not separately reported).

## 2023-05-01 ENCOUNTER — Other Ambulatory Visit (HOSPITAL_COMMUNITY): Payer: Self-pay

## 2023-05-01 DIAGNOSIS — E876 Hypokalemia: Secondary | ICD-10-CM | POA: Diagnosis not present

## 2023-05-01 DIAGNOSIS — K529 Noninfective gastroenteritis and colitis, unspecified: Secondary | ICD-10-CM | POA: Diagnosis not present

## 2023-05-01 DIAGNOSIS — R6510 Systemic inflammatory response syndrome (SIRS) of non-infectious origin without acute organ dysfunction: Secondary | ICD-10-CM | POA: Diagnosis not present

## 2023-05-01 DIAGNOSIS — A0472 Enterocolitis due to Clostridium difficile, not specified as recurrent: Secondary | ICD-10-CM

## 2023-05-01 LAB — GLUCOSE, CAPILLARY: Glucose-Capillary: 105 mg/dL — ABNORMAL HIGH (ref 70–99)

## 2023-05-01 MED ORDER — VANCOMYCIN HCL 125 MG PO CAPS
125.0000 mg | ORAL_CAPSULE | Freq: Four times a day (QID) | ORAL | 0 refills | Status: AC
  Filled 2023-05-01 (×2): qty 8, 2d supply, fill #0
  Filled 2023-05-01 (×3): qty 40, 10d supply, fill #0

## 2023-05-01 MED ORDER — FIDAXOMICIN 200 MG PO TABS
ORAL_TABLET | ORAL | 2 refills | Status: DC
  Filled 2023-05-01: qty 20, 25d supply, fill #0

## 2023-05-01 NOTE — Discharge Summary (Signed)
Physician Discharge Summary  Jodi Valdez:[id number] DOB: 1935/12/14 DOA: 04/26/2023  PCP: Ailene Ravel, MD  Admit date: 04/26/2023 Discharge date: 05/01/2023  Admitted From: Home Disposition:  Home  Recommendations for Outpatient Follow-up:  Follow up with PCP in 1-2 weeks Please obtain BMP/CBC in one week   Home Health:No Equipment/Devices:None  Discharge Condition:Stable CODE STATUS:Full Diet recommendation: Heart Healthy   Brief/Interim Summary:  87 y.o. female past medical history breast cancer status post chemotherapy radiation and meniscectomy, chronic diastolic heart failure, Hashimoto disease long COVID syndrome, polymyalgia rheumatica presented secondary to diarrhea weakness and near syncope.  Found to have C. difficile colitis    Discharge Diagnoses:  Principal Problem:   Proctocolitis Active Problems:   SIRS (systemic inflammatory response syndrome) (HCC)   Hypertensive crisis   Hyponatremia   Hypokalemia   Hypomagnesemia   COVID-19 long hauler manifesting chronic dyspnea   Hyperlipidemia   Hashimoto's thyroiditis   Essential hypertension   Polymyalgia (HCC)   Concern for C. difficile colitis   Diarrhea   Fall   Pancreatic insufficiency   Pancreatic cyst   Insomnia   Recurrent Clostridioides difficile diarrhea   Diarrhea, infectious, adult   Abnormal CT scan, gastrointestinal tract   Imaging of gastrointestinal tract abnormal   Recurrent colitis due to Clostridioides difficile  Recurrent infectious proctocolitis due to C. difficile colitis: CT scan of the abdomen pelvis showed inflammatory process which she was started on Dificid with no improvement blood cultures were negative. Infectious disease and GI were consulted infectious disease recommended to start her on Vanco for 14 days which she responded well and her diarrhea resolved. She will continue wearing mycin for 14 days and she will start a Dificid taper and will follow-up with ID  as an outpatient.  Sepsis: C. difficile colitis now resolved.  Nonspecific CT angio changes: The case was discussed with hematology and due to the nonspecific CT findings they recommended to hold anticoagulation and follow-up with oncology Dr. Elnita Maxwell as an outpatient.  The CT showed some multiple women in the right lower lobe artery of unknown sequel, she was not tachycardic she was not short of breath there is unlikely to be chronic PE 2D echo showed no right ventricular failure right sided dilation she had no difficulty breathing.  She will follow-up with oncology as an outpatient.  Essential hypertension: No change made to his medication continue current regimen.  Chronic diastolic heart failure: No change made to her medication.  Hypovolemic hyponatremia: Likely due to diarrhea resolved with IV fluids.  Hypokalemia: Likely due to diarrhea repleted now improved.  Hypomagnesemia: Likely due to diarrhea repleted now improved.  History of falls: PT evaluated the patient recommended no physical therapy evaluation.  Hashimoto thyroiditis: Continue Synthroid.  Osteoporosis: Continue current medication.  GERD: Continue PPI.  Hyperlipidemia: Continue statin.  Polymyalgia rheumatica: Continue steroids at home.   Discharge Instructions  Discharge Instructions     Diet - low sodium heart healthy   Complete by: As directed    Increase activity slowly   Complete by: As directed       Allergies as of 05/01/2023       Reactions   Propranolol Other (See Comments)   Hallucinations   Gabapentin Other (See Comments)   Hallucinations   Metformin And Related Diarrhea   Codeine Nausea And Vomiting   Nitrofuran Derivatives Nausea And Vomiting, Other (See Comments)   GI Intolerance        Medication List     TAKE  these medications    albuterol 108 (90 Base) MCG/ACT inhaler Commonly known as: VENTOLIN HFA Inhale 2 puffs into the lungs every 6 (six) hours as needed  for wheezing or shortness of breath.   alendronate 70 MG tablet Commonly known as: FOSAMAX Take 70 mg by mouth every Monday.   amLODipine 5 MG tablet Commonly known as: NORVASC Take 5 mg by mouth 2 (two) times daily.   ascorbic acid 1000 MG tablet Commonly known as: VITAMIN C Take 1,000 mg by mouth daily.   budesonide 0.5 MG/2ML nebulizer solution Commonly known as: PULMICORT Use one vial in nebulizer twice daily as directed. What changed:  how much to take how to take this when to take this reasons to take this additional instructions   carvedilol 6.25 MG tablet Commonly known as: COREG Take 6.25 mg by mouth 2 (two) times daily with a meal.   cholestyramine 4 g packet Commonly known as: QUESTRAN Take 1 packet (4 g total) by mouth daily.   EPINEPHrine 0.3 mg/0.3 mL Soaj injection Commonly known as: EPI-PEN Use as directed for life-threatening allergic reaction. What changed:  how much to take how to take this when to take this reasons to take this additional instructions   fidaxomicin 200 MG Tabs tablet Commonly known as: DIFICID Take 1 tablet (200 mg total) by mouth 2 (two) times daily for 5 days, THEN 1 tablet (200 mg total) every other day for 20 days. Start taking on: May 13, 2023   furosemide 40 MG tablet Commonly known as: LASIX TAKE 1 TABLET BY MOUTH EVERY DAY   isosorbide mononitrate 30 MG 24 hr tablet Commonly known as: IMDUR Take 1 tablet (30 mg total) by mouth daily.   levothyroxine 88 MCG tablet Commonly known as: SYNTHROID Take 1 tablet (88 mcg total) by mouth daily. What changed: when to take this   losartan 50 MG tablet Commonly known as: COZAAR Take 50 mg by mouth daily.   NAC PO Take 1 capsule by mouth daily.   nitroGLYCERIN 0.4 MG SL tablet Commonly known as: NITROSTAT PLACE 1 TABLET UNDER THE TONGUE EVERY 5 MINUTES AS NEEDED FOR CHEST PAIN. MAX 3 DOSES IN 15 MINUTES. IF STILL PAIN CALL 911. What changed: See the new  instructions.   OXYGEN Inhale 5 L/min into the lungs as needed (for shortness of breath).   pantoprazole 40 MG tablet Commonly known as: Protonix Take one tablet by mouth twice daily as directed. What changed:  how much to take how to take this when to take this additional instructions   potassium chloride SA 20 MEQ tablet Commonly known as: KLOR-CON M Take 1 tablet (20 mEq total) by mouth daily.   pravastatin 20 MG tablet Commonly known as: PRAVACHOL Take 20 mg by mouth daily.   predniSONE 1 MG tablet Commonly known as: DELTASONE Take 3 mg by mouth daily.   predniSONE 5 MG tablet Commonly known as: DELTASONE Take 5 mg by mouth daily.   ONE DAILY MULTIVIT-MIN ADULT PO Take 1 tablet by mouth daily.   PRESERVISION/LUTEIN PO Take 1 capsule by mouth 2 (two) times daily. Unknown strength   Spacer/Aero-Holding Rudean Curt As directed What changed:  how much to take how to take this when to take this   traZODone 100 MG tablet Commonly known as: DESYREL Take 150 mg by mouth at bedtime.   vancomycin 125 MG capsule Commonly known as: VANCOCIN Take 1 capsule (125 mg total) by mouth 4 (four) times daily for 12  days.   Vitamin D-3 125 MCG (5000 UT) Tabs Take 5,000 Units by mouth 2 (two) times daily.        Allergies  Allergen Reactions   Propranolol Other (See Comments)    Hallucinations    Gabapentin Other (See Comments)    Hallucinations   Metformin And Related Diarrhea   Codeine Nausea And Vomiting   Nitrofuran Derivatives Nausea And Vomiting and Other (See Comments)    GI Intolerance    Consultations: Enterology Infectious disease.   Procedures/Studies: ECHOCARDIOGRAM LIMITED  Result Date: 04/27/2023    ECHOCARDIOGRAM LIMITED REPORT   Patient Name:   DAIONA TOMEY Date of Exam: 04/27/2023 Medical Rec #:  [id number]             Height:       64.0 in Accession #:    [id number]            Weight:       120.0 lb Date of Birth:  [birth year]             BSA:          1.575 m Patient Age:    86 years              BP:           105/92 mmHg Patient Gender: F                     HR:           84 bpm. Exam Location:  Inpatient Procedure: Limited Echo, Cardiac Doppler and Color Doppler Indications:    I26.02 Pulmonary embolus  History:        Patient has prior history of Echocardiogram examinations, most                 recent 11/23/2022. Risk Factors:Hypertension.  Sonographer:    Darlys Gales Referring Phys: [id number] SUBRINA SUNDIL IMPRESSIONS  1. Left ventricular ejection fraction, by estimation, is 60 to 65%. The left ventricle has normal function. The left ventricle has no regional wall motion abnormalities. Left ventricular diastolic parameters are consistent with Grade I diastolic dysfunction (impaired relaxation).  2. Right ventricular systolic function is normal. The right ventricular size is normal.  3. The mitral valve is normal in structure. No evidence of mitral valve regurgitation. Mild mitral stenosis. The mean mitral valve gradient is 5.7 mmHg. Moderate mitral annular calcification.  4. The aortic valve is tricuspid. There is mild calcification of the aortic valve. There is mild thickening of the aortic valve. Aortic valve regurgitation is not visualized. No aortic stenosis is present.  5. The inferior vena cava is dilated in size with >50% respiratory variability, suggesting right atrial pressure of 8 mmHg. FINDINGS  Left Ventricle: Left ventricular ejection fraction, by estimation, is 60 to 65%. The left ventricle has normal function. The left ventricle has no regional wall motion abnormalities. The left ventricular internal cavity size was normal in size. There is  no left ventricular hypertrophy. Left ventricular diastolic parameters are consistent with Grade I diastolic dysfunction (impaired relaxation). Right Ventricle: The right ventricular size is normal. No increase in right ventricular wall thickness. Right ventricular systolic function is  normal. Left Atrium: Left atrial size was normal in size. Right Atrium: Right atrial size was normal in size. Pericardium: There is no evidence of pericardial effusion. Mitral Valve: The mitral valve is normal in structure. There is moderate thickening of the mitral valve leaflet(s). There is moderate  calcification of the mitral valve leaflet(s). Moderate mitral annular calcification. Mild mitral valve stenosis. MV peak  gradient, 12.2 mmHg. The mean mitral valve gradient is 5.7 mmHg with average heart rate of 85 bpm. Tricuspid Valve: The tricuspid valve is normal in structure. Tricuspid valve regurgitation is not demonstrated. No evidence of tricuspid stenosis. Aortic Valve: The aortic valve is tricuspid. There is mild calcification of the aortic valve. There is mild thickening of the aortic valve. Aortic valve regurgitation is not visualized. No aortic stenosis is present. Pulmonic Valve: The pulmonic valve was normal in structure. Pulmonic valve regurgitation is not visualized. No evidence of pulmonic stenosis. Aorta: The aortic root is normal in size and structure. Venous: The inferior vena cava is dilated in size with greater than 50% respiratory variability, suggesting right atrial pressure of 8 mmHg. IAS/Shunts: No atrial level shunt detected by color flow Doppler. LEFT VENTRICLE PLAX 2D LVIDd:         3.90 cm LVIDs:         2.80 cm LV PW:         0.90 cm LV IVS:        1.00 cm LVOT diam:     1.90 cm LVOT Area:     2.84 cm  IVC IVC diam: 2.00 cm LEFT ATRIUM             Index        RIGHT ATRIUM           Index LA Vol (A2C):   39.4 ml 25.02 ml/m  RA Area:     11.10 cm LA Vol (A4C):   35.0 ml 22.23 ml/m  RA Volume:   24.40 ml  15.50 ml/m LA Biplane Vol: 37.3 ml 23.69 ml/m  MITRAL VALVE MV Peak grad: 12.2 mmHg  SHUNTS MV Mean grad: 5.7 mmHg   Systemic Diam: 1.90 cm MV Vmax:      1.75 m/s MV Vmean:     114.7 cm/s Chilton Si MD Electronically signed by Chilton Si MD Signature Date/Time:  04/27/2023/11:47:38 AM    Final    CT Angio Chest PE W and/or Wo Contrast  Result Date: 04/26/2023 CLINICAL DATA:  Pulmonary embolism (PE) suspected, high prob; Abdominal pain, acute, nonlocalized EXAM: CT ANGIOGRAPHY CHEST CT ABDOMEN AND PELVIS WITH CONTRAST TECHNIQUE: Multidetector CT imaging of the chest was performed using the standard protocol during bolus administration of intravenous contrast. Multiplanar CT image reconstructions and MIPs were obtained to evaluate the vascular anatomy. Multidetector CT imaging of the abdomen and pelvis was performed using the standard protocol during bolus administration of intravenous contrast. RADIATION DOSE REDUCTION: This exam was performed according to the departmental dose-optimization program which includes automated exposure control, adjustment of the mA and/or kV according to patient size and/or use of iterative reconstruction technique. CONTRAST:  80mL OMNIPAQUE IOHEXOL 350 MG/ML SOLN COMPARISON:  CT chest 07/18/2021, CT abdomen pelvis 02/24/2023 FINDINGS: CTA CHEST FINDINGS Cardiovascular: There is adequate opacification of the pulmonary arterial tree. There are multiple thin web seen within the right lower lobar pulmonary artery best seen on image # 68/4 in keeping with the sequela of chronic pulmonary embolus. However, no intraluminal filling defect is identified to suggest acute pulmonary embolism. The central pulmonary arteries are of normal caliber. Moderate multi-vessel coronary artery calcification. Global cardiac size is within normal limits. No pericardial effusion. Extensive atherosclerotic calcification within the thoracic aorta. No aortic aneurysm. Mediastinum/Nodes: No enlarged mediastinal, hilar, or axillary lymph nodes. Thyroid gland, trachea, and esophagus demonstrate  no significant findings. Lungs/Pleura: Lungs are clear. No pneumothorax. Trace bilateral pleural effusions. No central obstructing lesion. Musculoskeletal: Surgical changes of left  mastectomy and axillary lymph node dissection are identified. Stable superior endplate fracture of T3. Interval development of a remote appearing superior endplate fracture of T6 with approximately 50% loss of height. No acute bone abnormality. No lytic or blastic bone lesion. Review of the MIP images confirms the above findings. CT ABDOMEN and PELVIS FINDINGS Hepatobiliary: No focal liver abnormality is seen. No gallstones, gallbladder wall thickening, or biliary dilatation. Pancreas: Cystic lesion within the uncinate process of the pancreas is unchanged measuring up to 3.7 cm in greatest dimension and, as noted on prior examination, demonstrates slow interval growth since remote prior examination of 12/09/2017 where this measured 11 mm in greatest dimension. Given its slow interval progression, a benign etiology such as a side branch IPMN is considered most likely. The pancreas is otherwise unremarkable; the main pancreatic duct is not dilated. No peripancreatic inflammatory changes or fluid collections are seen. Spleen: Unremarkable Adrenals/Urinary Tract: The adrenal glands are unremarkable. The kidneys are normal. Foley catheter balloon is seen within a decompressed bladder lumen. There is superimposed circumferential bladder wall thickening and mild hyperemia noted, however, suggesting a superimposed infectious or inflammatory cystitis. Mild perivesicular inflammatory changes noted. Stomach/Bowel: There is marked hyperemia and circumferential wall thickening involving the rectosigmoid colon and rectum with mild perirectal inflammatory stranding noted in keeping with an infectious or inflammatory proctocolitis as can be seen with ulcerative colitis or pseudomembranous colitis. There is no evidence of obstruction or perforation. No free intraperitoneal gas. Trace free fluid within the pelvis. The stomach and small bowel are unremarkable. Appendix absent. Vascular/Lymphatic: Extensive aortoiliac atherosclerotic  calcification with probable hemodynamically significant stenoses of the celiac axis and proximal superior and inferior mesenteric arteries as well as the renal arteries bilaterally. Calcified plaque within the infrarenal abdominal aorta may result in hemodynamically significant stenosis in this location as well. No aortic aneurysm. No pathologic adenopathy within the abdomen and pelvis. Reproductive: Status post hysterectomy. No adnexal masses. Other: No abdominal wall hernia Musculoskeletal: No acute bone abnormality. No lytic or blastic bone lesion. Degenerative changes are seen within the lumbar spine. Remote L1 and L2 compression deformities are identified. No acute bone abnormality. Review of the MIP images confirms the above findings. IMPRESSION: 1. No definite evidence of acute pulmonary embolism. Multiple thin webs within the right lower lobar pulmonary artery in keeping with the sequela of chronic pulmonary embolus. 2. Moderate multi-vessel coronary artery calcification. 3. Trace bilateral pleural effusions. 4. Interval development of a remote appearing superior endplate fracture of T6 with approximately 50% loss of height. 5. Stable cystic lesion within the uncinate process of the pancreas measuring up to 3.7 cm in greatest dimension and, as noted on prior examination, demonstrates slow interval growth since remote prior examination of 12/09/2017 where this measured 11 mm in greatest dimension. Given its slow interval progression, a benign etiology such as a side branch IPMN is considered most likely. 6. Extensive aortoiliac atherosclerotic calcification with probable hemodynamically significant stenoses of the celiac axis, proximal superior and inferior mesenteric arteries, and renal arteries bilaterally. Calcified plaque within the infrarenal abdominal aorta may result in hemodynamically significant stenosis in this location as well. Clinical correlation for signs and symptoms of chronic mesenteric  ischemia or hemodynamically significant renal artery stenosis may be helpful. 7. Infectious or inflammatory proctocolitis as can be seen with ulcerative colitis or pseudomembranous colitis. No evidence of obstruction or perforation.  8.  Aortic Atherosclerosis (ICD10-I70.0). Electronically Signed   By: Helyn Numbers M.D.   On: 04/26/2023 22:04   CT ABDOMEN PELVIS W CONTRAST  Result Date: 04/26/2023 CLINICAL DATA:  Pulmonary embolism (PE) suspected, high prob; Abdominal pain, acute, nonlocalized EXAM: CT ANGIOGRAPHY CHEST CT ABDOMEN AND PELVIS WITH CONTRAST TECHNIQUE: Multidetector CT imaging of the chest was performed using the standard protocol during bolus administration of intravenous contrast. Multiplanar CT image reconstructions and MIPs were obtained to evaluate the vascular anatomy. Multidetector CT imaging of the abdomen and pelvis was performed using the standard protocol during bolus administration of intravenous contrast. RADIATION DOSE REDUCTION: This exam was performed according to the departmental dose-optimization program which includes automated exposure control, adjustment of the mA and/or kV according to patient size and/or use of iterative reconstruction technique. CONTRAST:  80mL OMNIPAQUE IOHEXOL 350 MG/ML SOLN COMPARISON:  CT chest 07/18/2021, CT abdomen pelvis 02/24/2023 FINDINGS: CTA CHEST FINDINGS Cardiovascular: There is adequate opacification of the pulmonary arterial tree. There are multiple thin web seen within the right lower lobar pulmonary artery best seen on image # 68/4 in keeping with the sequela of chronic pulmonary embolus. However, no intraluminal filling defect is identified to suggest acute pulmonary embolism. The central pulmonary arteries are of normal caliber. Moderate multi-vessel coronary artery calcification. Global cardiac size is within normal limits. No pericardial effusion. Extensive atherosclerotic calcification within the thoracic aorta. No aortic aneurysm.  Mediastinum/Nodes: No enlarged mediastinal, hilar, or axillary lymph nodes. Thyroid gland, trachea, and esophagus demonstrate no significant findings. Lungs/Pleura: Lungs are clear. No pneumothorax. Trace bilateral pleural effusions. No central obstructing lesion. Musculoskeletal: Surgical changes of left mastectomy and axillary lymph node dissection are identified. Stable superior endplate fracture of T3. Interval development of a remote appearing superior endplate fracture of T6 with approximately 50% loss of height. No acute bone abnormality. No lytic or blastic bone lesion. Review of the MIP images confirms the above findings. CT ABDOMEN and PELVIS FINDINGS Hepatobiliary: No focal liver abnormality is seen. No gallstones, gallbladder wall thickening, or biliary dilatation. Pancreas: Cystic lesion within the uncinate process of the pancreas is unchanged measuring up to 3.7 cm in greatest dimension and, as noted on prior examination, demonstrates slow interval growth since remote prior examination of 12/09/2017 where this measured 11 mm in greatest dimension. Given its slow interval progression, a benign etiology such as a side branch IPMN is considered most likely. The pancreas is otherwise unremarkable; the main pancreatic duct is not dilated. No peripancreatic inflammatory changes or fluid collections are seen. Spleen: Unremarkable Adrenals/Urinary Tract: The adrenal glands are unremarkable. The kidneys are normal. Foley catheter balloon is seen within a decompressed bladder lumen. There is superimposed circumferential bladder wall thickening and mild hyperemia noted, however, suggesting a superimposed infectious or inflammatory cystitis. Mild perivesicular inflammatory changes noted. Stomach/Bowel: There is marked hyperemia and circumferential wall thickening involving the rectosigmoid colon and rectum with mild perirectal inflammatory stranding noted in keeping with an infectious or inflammatory proctocolitis  as can be seen with ulcerative colitis or pseudomembranous colitis. There is no evidence of obstruction or perforation. No free intraperitoneal gas. Trace free fluid within the pelvis. The stomach and small bowel are unremarkable. Appendix absent. Vascular/Lymphatic: Extensive aortoiliac atherosclerotic calcification with probable hemodynamically significant stenoses of the celiac axis and proximal superior and inferior mesenteric arteries as well as the renal arteries bilaterally. Calcified plaque within the infrarenal abdominal aorta may result in hemodynamically significant stenosis in this location as well. No aortic aneurysm. No pathologic adenopathy  within the abdomen and pelvis. Reproductive: Status post hysterectomy. No adnexal masses. Other: No abdominal wall hernia Musculoskeletal: No acute bone abnormality. No lytic or blastic bone lesion. Degenerative changes are seen within the lumbar spine. Remote L1 and L2 compression deformities are identified. No acute bone abnormality. Review of the MIP images confirms the above findings. IMPRESSION: 1. No definite evidence of acute pulmonary embolism. Multiple thin webs within the right lower lobar pulmonary artery in keeping with the sequela of chronic pulmonary embolus. 2. Moderate multi-vessel coronary artery calcification. 3. Trace bilateral pleural effusions. 4. Interval development of a remote appearing superior endplate fracture of T6 with approximately 50% loss of height. 5. Stable cystic lesion within the uncinate process of the pancreas measuring up to 3.7 cm in greatest dimension and, as noted on prior examination, demonstrates slow interval growth since remote prior examination of 12/09/2017 where this measured 11 mm in greatest dimension. Given its slow interval progression, a benign etiology such as a side branch IPMN is considered most likely. 6. Extensive aortoiliac atherosclerotic calcification with probable hemodynamically significant stenoses of  the celiac axis, proximal superior and inferior mesenteric arteries, and renal arteries bilaterally. Calcified plaque within the infrarenal abdominal aorta may result in hemodynamically significant stenosis in this location as well. Clinical correlation for signs and symptoms of chronic mesenteric ischemia or hemodynamically significant renal artery stenosis may be helpful. 7. Infectious or inflammatory proctocolitis as can be seen with ulcerative colitis or pseudomembranous colitis. No evidence of obstruction or perforation. 8.  Aortic Atherosclerosis (ICD10-I70.0). Electronically Signed   By: Helyn Numbers M.D.   On: 04/26/2023 22:04   DG Chest Portable 1 View  Result Date: 04/26/2023 CLINICAL DATA:  Syncope, fall EXAM: PORTABLE CHEST 1 VIEW COMPARISON:  10/22/2022 FINDINGS: Cardiac size is within normal limits. Thoracic aorta is tortuous. Calcifications are seen in thoracic aorta. There are no signs of pulmonary edema or focal pulmonary consolidation. There is no pleural effusion or pneumothorax. Surgical clips are seen in left chest wall. IMPRESSION: No active cardiopulmonary disease. Electronically Signed   By: Ernie Avena M.D.   On: 04/26/2023 17:44   (Echo, Carotid, EGD, Colonoscopy, ERCP)    Subjective: No complaints  Discharge Exam: Vitals:   04/30/23 2023 05/01/23 0430  BP: (!) 151/80 (!) 150/81  Pulse: 99 93  Resp: 20 16  Temp: 97.7 F (36.5 C) 97.6 F (36.4 C)  SpO2: 98% 96%   Vitals:   04/30/23 0603 04/30/23 1259 04/30/23 2023 05/01/23 0430  BP: 133/73 118/62 (!) 151/80 (!) 150/81  Pulse: 94 78 99 93  Resp: 20  20 16   Temp: 98.3 F (36.8 C) 98 F (36.7 C) 97.7 F (36.5 C) 97.6 F (36.4 C)  TempSrc: Oral Oral Oral   SpO2: 97% 97% 98% 96%  Weight:    53.5 kg  Height:        General: Pt is alert, awake, not in acute distress Cardiovascular: RRR, S1/S2 +, no rubs, no gallops Respiratory: CTA bilaterally, no wheezing, no rhonchi Abdominal: Soft, NT, ND, bowel  sounds + Extremities: no edema, no cyanosis    The results of significant diagnostics from this hospitalization (including imaging, microbiology, ancillary and laboratory) are listed below for reference.     Microbiology: Recent Results (from the past 240 hour(s))  Resp panel by RT-PCR (RSV, Flu A&B, Covid) Anterior Nasal Swab     Status: None   Collection Time: 04/26/23  4:04 PM   Specimen: Anterior Nasal Swab  Result Value Ref  Range Status   SARS Coronavirus 2 by RT PCR NEGATIVE NEGATIVE Final    Comment: (NOTE) SARS-CoV-2 target nucleic acids are NOT DETECTED.  The SARS-CoV-2 RNA is generally detectable in upper respiratory specimens during the acute phase of infection. The lowest concentration of SARS-CoV-2 viral copies this assay can detect is 138 copies/mL. A negative result does not preclude SARS-Cov-2 infection and should not be used as the sole basis for treatment or other patient management decisions. A negative result may occur with  improper specimen collection/handling, submission of specimen other than nasopharyngeal swab, presence of viral mutation(s) within the areas targeted by this assay, and inadequate number of viral copies(<138 copies/mL). A negative result must be combined with clinical observations, patient history, and epidemiological information. The expected result is Negative.  Fact Sheet for Patients:  BloggerCourse.com  Fact Sheet for Healthcare Providers:  SeriousBroker.it  This test is no t yet approved or cleared by the [place] FDA and  has been authorized for detection and/or diagnosis of SARS-CoV-2 by FDA under an Emergency Use Authorization (EUA). This EUA will remain  in effect (meaning this test can be used) for the duration of the COVID-19 declaration under Section 564(b)(1) of the Act, 21 U.S.C.section 360bbb-3(b)(1), unless the authorization is terminated  or revoked sooner.        Influenza A by PCR NEGATIVE NEGATIVE Final   Influenza B by PCR NEGATIVE NEGATIVE Final    Comment: (NOTE) The Xpert Xpress SARS-CoV-2/FLU/RSV plus assay is intended as an aid in the diagnosis of influenza from Nasopharyngeal swab specimens and should not be used as a sole basis for treatment. Nasal washings and aspirates are unacceptable for Xpert Xpress SARS-CoV-2/FLU/RSV testing.  Fact Sheet for Patients: BloggerCourse.com  Fact Sheet for Healthcare Providers: SeriousBroker.it  This test is not yet approved or cleared by the [place] FDA and has been authorized for detection and/or diagnosis of SARS-CoV-2 by FDA under an Emergency Use Authorization (EUA). This EUA will remain in effect (meaning this test can be used) for the duration of the COVID-19 declaration under Section 564(b)(1) of the Act, 21 U.S.C. section 360bbb-3(b)(1), unless the authorization is terminated or revoked.     Resp Syncytial Virus by PCR NEGATIVE NEGATIVE Final    Comment: (NOTE) Fact Sheet for Patients: BloggerCourse.com  Fact Sheet for Healthcare Providers: SeriousBroker.it  This test is not yet approved or cleared by the [place] FDA and has been authorized for detection and/or diagnosis of SARS-CoV-2 by FDA under an Emergency Use Authorization (EUA). This EUA will remain in effect (meaning this test can be used) for the duration of the COVID-19 declaration under Section 564(b)(1) of the Act, 21 U.S.C. section 360bbb-3(b)(1), unless the authorization is terminated or revoked.  Performed at [facility], [address] [place], [place] [address]   C Difficile Quick Screen w PCR reflex     Status: Abnormal   Collection Time: 04/27/23  1:04 AM   Specimen: STOOL  Result Value Ref Range Status   C Diff antigen POSITIVE (A) NEGATIVE Final   C Diff toxin  POSITIVE (A) NEGATIVE Final   C Diff interpretation Toxin producing C. difficile detected.  Final    Comment: CRITICAL RESULT CALLED TO, READ BACK BY AND VERIFIED WITH: Jerilee Hoh RN @ [phone] ON 04/27/2023 BY ABDULHALIM,M Performed at [facility], [address] [place], [place] [address]   Gastrointestinal Panel by PCR , Stool     Status: None   Collection  Time: 04/27/23  1:04 AM   Specimen: STOOL  Result Value Ref Range Status   Campylobacter species NOT DETECTED NOT DETECTED Final   Plesimonas shigelloides NOT DETECTED NOT DETECTED Final   Salmonella species NOT DETECTED NOT DETECTED Final   Yersinia enterocolitica NOT DETECTED NOT DETECTED Final   Vibrio species NOT DETECTED NOT DETECTED Final   Vibrio cholerae NOT DETECTED NOT DETECTED Final   Enteroaggregative E coli (EAEC) NOT DETECTED NOT DETECTED Final   Enteropathogenic E coli (EPEC) NOT DETECTED NOT DETECTED Final   Enterotoxigenic E coli (ETEC) NOT DETECTED NOT DETECTED Final   Shiga like toxin producing E coli (STEC) NOT DETECTED NOT DETECTED Final   Shigella/Enteroinvasive E coli (EIEC) NOT DETECTED NOT DETECTED Final   Cryptosporidium NOT DETECTED NOT DETECTED Final   Cyclospora cayetanensis NOT DETECTED NOT DETECTED Final   Entamoeba histolytica NOT DETECTED NOT DETECTED Final   Giardia lamblia NOT DETECTED NOT DETECTED Final   Adenovirus F40/41 NOT DETECTED NOT DETECTED Final   Astrovirus NOT DETECTED NOT DETECTED Final   Norovirus GI/GII NOT DETECTED NOT DETECTED Final   Rotavirus A NOT DETECTED NOT DETECTED Final   Sapovirus (I, II, IV, and V) NOT DETECTED NOT DETECTED Final    Comment: Performed at [facility], [address] [place], [place] [address]  OVA + PARASITE EXAM     Status: None   Collection Time: 04/27/23  1:04 AM   Specimen: STOOL  Result Value Ref Range Status   OVA + PARASITE EXAM Final report  Final    Comment: (NOTE) These results were obtained using wet  preparation(s) and trichrome stained smear. This test does not include testing for Cryptosporidium parvum, Cyclospora, or Microsporidia. Performed At: [facility] [address] [place], [place] [id number] Jolene Schimke MD QQ:[id number]    Source of Sample STOOL  Final    Comment: Performed at [facility], [address], [place], [place] [address]  Calprotectin, Fecal     Status: Abnormal   Collection Time: 04/27/23  1:05 AM   Specimen: STOOL  Result Value Ref Range Status   Calprotectin, Fecal 1,550 (H) 0 - 120 ug/g Final    Comment: (NOTE) **Results verified by repeat testing** Concentration     Interpretation   Follow-Up < 5 - 50 ug/g     Normal           None >50 -120 ug/g     Borderline       Re-evaluate in 4-6 weeks    >120 ug/g     Abnormal         Repeat as clinically                                   indicated Performed At: [facility] [address] [place], [place] [id number] Jolene Schimke MD TK:[id number]      Labs: BNP (last 3 results) No results for input(s): "BNP" in the last 8760 hours. Basic Metabolic Panel: Recent Labs  Lab 04/26/23 1604 04/27/23 0521 04/28/23 0415 04/29/23 0441  NA 134* 133* 136 137  K 3.2* 3.6 3.5 4.9  CL 103 106 107 110  CO2 22 17* 20* 22  GLUCOSE 159* 127* 113* 87  BUN 18 11 11 9   CREATININE 0.91 0.68 0.78 0.71  CALCIUM 8.3* 7.3* 7.3* 7.9*  MG 1.6* 2.3  --   --    Liver Function Tests:  Recent Labs  Lab 04/26/23 1604 04/27/23 0521 04/28/23 0415 04/29/23 0441  AST 16 19 12* 11*  ALT 28 21 17 17   ALKPHOS 41 39 34* 32*  BILITOT 0.7 0.6 0.8 0.7  PROT 5.8* 4.8* 4.4* 4.7*  ALBUMIN 3.1* 2.5* 2.2* 2.4*   No results for input(s): "LIPASE", "AMYLASE" in the last 168 hours. No results for input(s): "AMMONIA" in the last 168 hours. CBC: Recent Labs  Lab 04/26/23 1604 04/27/23 0630 04/28/23 0415 04/29/23 0441  WBC 15.8* 12.3* 7.9 6.4  NEUTROABS  --  9.3* 5.3 2.6  HGB 13.6 11.8*  10.3* 10.1*  HCT 40.9 35.5* 31.2* 30.5*  MCV 96.0 96.5 97.5 96.2  PLT 185 162 141* 170   Cardiac Enzymes: Recent Labs  Lab 04/26/23 1604  CKTOTAL 58   BNP: Invalid input(s): "POCBNP" CBG: Recent Labs  Lab 04/27/23 0759 04/27/23 0905 04/29/23 0753 04/30/23 0738 05/01/23 0756  GLUCAP 129* 127* 106* 99 105*   D-Dimer No results for input(s): "DDIMER" in the last 72 hours. Hgb A1c No results for input(s): "HGBA1C" in the last 72 hours. Lipid Profile No results for input(s): "CHOL", "HDL", "LDLCALC", "TRIG", "CHOLHDL", "LDLDIRECT" in the last 72 hours. Thyroid function studies No results for input(s): "TSH", "T4TOTAL", "T3FREE", "THYROIDAB" in the last 72 hours.  Invalid input(s): "FREET3" Anemia work up No results for input(s): "VITAMINB12", "FOLATE", "FERRITIN", "TIBC", "IRON", "RETICCTPCT" in the last 72 hours. Urinalysis    Component Value Date/Time   COLORURINE YELLOW 04/26/2023 1734   APPEARANCEUR CLEAR 04/26/2023 1734   LABSPEC 1.010 04/26/2023 1734   LABSPEC 1.005 07/08/2007 1555   PHURINE 5.0 04/26/2023 1734   GLUCOSEU NEGATIVE 04/26/2023 1734   GLUCOSEU NEGATIVE 07/16/2014 1120   HGBUR NEGATIVE 04/26/2023 1734   BILIRUBINUR NEGATIVE 04/26/2023 1734   BILIRUBINUR Negative 07/08/2007 1555   KETONESUR NEGATIVE 04/26/2023 1734   PROTEINUR NEGATIVE 04/26/2023 1734   UROBILINOGEN 0.2 09/26/2014 1130   NITRITE NEGATIVE 04/26/2023 1734   LEUKOCYTESUR NEGATIVE 04/26/2023 1734   LEUKOCYTESUR Negative 07/08/2007 1555   Sepsis Labs Recent Labs  Lab 04/26/23 1604 04/27/23 0630 04/28/23 0415 04/29/23 0441  WBC 15.8* 12.3* 7.9 6.4   Microbiology Recent Results (from the past 240 hour(s))  Resp panel by RT-PCR (RSV, Flu A&B, Covid) Anterior Nasal Swab     Status: None   Collection Time: 04/26/23  4:04 PM   Specimen: Anterior Nasal Swab  Result Value Ref Range Status   SARS Coronavirus 2 by RT PCR NEGATIVE NEGATIVE Final    Comment: (NOTE) SARS-CoV-2 target  nucleic acids are NOT DETECTED.  The SARS-CoV-2 RNA is generally detectable in upper respiratory specimens during the acute phase of infection. The lowest concentration of SARS-CoV-2 viral copies this assay can detect is 138 copies/mL. A negative result does not preclude SARS-Cov-2 infection and should not be used as the sole basis for treatment or other patient management decisions. A negative result may occur with  improper specimen collection/handling, submission of specimen other than nasopharyngeal swab, presence of viral mutation(s) within the areas targeted by this assay, and inadequate number of viral copies(<138 copies/mL). A negative result must be combined with clinical observations, patient history, and epidemiological information. The expected result is Negative.  Fact Sheet for Patients:  BloggerCourse.com  Fact Sheet for Healthcare Providers:  SeriousBroker.it  This test is no t yet approved or cleared by the [place] FDA and  has been authorized for detection and/or diagnosis of SARS-CoV-2 by FDA under an Emergency Use Authorization (EUA). This  EUA will remain  in effect (meaning this test can be used) for the duration of the COVID-19 declaration under Section 564(b)(1) of the Act, 21 U.S.C.section 360bbb-3(b)(1), unless the authorization is terminated  or revoked sooner.       Influenza A by PCR NEGATIVE NEGATIVE Final   Influenza B by PCR NEGATIVE NEGATIVE Final    Comment: (NOTE) The Xpert Xpress SARS-CoV-2/FLU/RSV plus assay is intended as an aid in the diagnosis of influenza from Nasopharyngeal swab specimens and should not be used as a sole basis for treatment. Nasal washings and aspirates are unacceptable for Xpert Xpress SARS-CoV-2/FLU/RSV testing.  Fact Sheet for Patients: BloggerCourse.com  Fact Sheet for Healthcare  Providers: SeriousBroker.it  This test is not yet approved or cleared by the [place] FDA and has been authorized for detection and/or diagnosis of SARS-CoV-2 by FDA under an Emergency Use Authorization (EUA). This EUA will remain in effect (meaning this test can be used) for the duration of the COVID-19 declaration under Section 564(b)(1) of the Act, 21 U.S.C. section 360bbb-3(b)(1), unless the authorization is terminated or revoked.     Resp Syncytial Virus by PCR NEGATIVE NEGATIVE Final    Comment: (NOTE) Fact Sheet for Patients: BloggerCourse.com  Fact Sheet for Healthcare Providers: SeriousBroker.it  This test is not yet approved or cleared by the [place] FDA and has been authorized for detection and/or diagnosis of SARS-CoV-2 by FDA under an Emergency Use Authorization (EUA). This EUA will remain in effect (meaning this test can be used) for the duration of the COVID-19 declaration under Section 564(b)(1) of the Act, 21 U.S.C. section 360bbb-3(b)(1), unless the authorization is terminated or revoked.  Performed at [facility], [address] [place], [place] [address]   C Difficile Quick Screen w PCR reflex     Status: Abnormal   Collection Time: 04/27/23  1:04 AM   Specimen: STOOL  Result Value Ref Range Status   C Diff antigen POSITIVE (A) NEGATIVE Final   C Diff toxin POSITIVE (A) NEGATIVE Final   C Diff interpretation Toxin producing C. difficile detected.  Final    Comment: CRITICAL RESULT CALLED TO, READ BACK BY AND VERIFIED WITH: Jerilee Hoh RN @ [phone] ON 04/27/2023 BY ABDULHALIM,M Performed at [facility], [address], [place], [place] [address]   Gastrointestinal Panel by PCR , Stool     Status: None   Collection Time: 04/27/23  1:04 AM   Specimen: STOOL  Result Value Ref Range Status   Campylobacter species NOT DETECTED NOT DETECTED  Final   Plesimonas shigelloides NOT DETECTED NOT DETECTED Final   Salmonella species NOT DETECTED NOT DETECTED Final   Yersinia enterocolitica NOT DETECTED NOT DETECTED Final   Vibrio species NOT DETECTED NOT DETECTED Final   Vibrio cholerae NOT DETECTED NOT DETECTED Final   Enteroaggregative E coli (EAEC) NOT DETECTED NOT DETECTED Final   Enteropathogenic E coli (EPEC) NOT DETECTED NOT DETECTED Final   Enterotoxigenic E coli (ETEC) NOT DETECTED NOT DETECTED Final   Shiga like toxin producing E coli (STEC) NOT DETECTED NOT DETECTED Final   Shigella/Enteroinvasive E coli (EIEC) NOT DETECTED NOT DETECTED Final   Cryptosporidium NOT DETECTED NOT DETECTED Final   Cyclospora cayetanensis NOT DETECTED NOT DETECTED Final   Entamoeba histolytica NOT DETECTED NOT DETECTED Final   Giardia lamblia NOT DETECTED NOT DETECTED Final   Adenovirus F40/41 NOT DETECTED NOT DETECTED Final   Astrovirus NOT DETECTED NOT DETECTED Final   Norovirus GI/GII NOT  DETECTED NOT DETECTED Final   Rotavirus A NOT DETECTED NOT DETECTED Final   Sapovirus (I, II, IV, and V) NOT DETECTED NOT DETECTED Final    Comment: Performed at [facility], [address] [place], [place] [address]  OVA + PARASITE EXAM     Status: None   Collection Time: 04/27/23  1:04 AM   Specimen: STOOL  Result Value Ref Range Status   OVA + PARASITE EXAM Final report  Final    Comment: (NOTE) These results were obtained using wet preparation(s) and trichrome stained smear. This test does not include testing for Cryptosporidium parvum, Cyclospora, or Microsporidia. Performed At: [facility] [address] [place], [place] [id number] Jolene Schimke MD IO:[id number]    Source of Sample STOOL  Final    Comment: Performed at [facility], [address] [place], [place] [address]  Calprotectin, Fecal     Status: Abnormal   Collection Time: 04/27/23  1:05 AM   Specimen: STOOL  Result Value Ref Range Status    Calprotectin, Fecal 1,550 (H) 0 - 120 ug/g Final    Comment: (NOTE) **Results verified by repeat testing** Concentration     Interpretation   Follow-Up < 5 - 50 ug/g     Normal           None >50 -120 ug/g     Borderline       Re-evaluate in 4-6 weeks    >120 ug/g     Abnormal         Repeat as clinically                                   indicated Performed At: [facility] [address] [place], [place] [id number] Jolene Schimke MD QI:[id number]      SIGNED:   Marinda Elk, MD  [facility] 05/01/2023, 8:28 AM Pager   If 7PM-7AM, please contact night-coverage [email or web address] Password [email or web address]

## 2023-05-01 NOTE — Plan of Care (Signed)
  Problem: Education: Goal: Knowledge of General Education information will improve Description: Including pain rating scale, medication(s)/side effects and non-pharmacologic comfort measures Outcome: Progressing   Problem: Health Behavior/Discharge Planning: Goal: Ability to manage health-related needs will improve Outcome: Progressing   Problem: Clinical Measurements: Goal: Ability to maintain clinical measurements within normal limits will improve Outcome: Progressing   Problem: Clinical Measurements: Goal: Will remain free from infection Outcome: Adequate for Discharge Goal: Diagnostic test results will improve Outcome: Adequate for Discharge Goal: Respiratory complications will improve Outcome: Adequate for Discharge Goal: Cardiovascular complication will be avoided Outcome: Adequate for Discharge   Problem: Activity: Goal: Risk for activity intolerance will decrease Outcome: Adequate for Discharge   Problem: Nutrition: Goal: Adequate nutrition will be maintained Outcome: Adequate for Discharge   Problem: Coping: Goal: Level of anxiety will decrease Outcome: Adequate for Discharge   Problem: Elimination: Goal: Will not experience complications related to bowel motility Outcome: Adequate for Discharge Goal: Will not experience complications related to urinary retention Outcome: Adequate for Discharge   Problem: Pain Managment: Goal: General experience of comfort will improve Outcome: Adequate for Discharge   Problem: Safety: Goal: Ability to remain free from injury will improve Outcome: Adequate for Discharge   Problem: Skin Integrity: Goal: Risk for impaired skin integrity will decrease Outcome: Adequate for Discharge

## 2023-05-03 ENCOUNTER — Other Ambulatory Visit (HOSPITAL_COMMUNITY): Payer: Self-pay

## 2023-05-17 DIAGNOSIS — J449 Chronic obstructive pulmonary disease, unspecified: Secondary | ICD-10-CM | POA: Diagnosis not present

## 2023-05-20 ENCOUNTER — Other Ambulatory Visit (HOSPITAL_COMMUNITY): Payer: Self-pay | Admitting: *Deleted

## 2023-05-25 ENCOUNTER — Other Ambulatory Visit (HOSPITAL_COMMUNITY): Payer: Self-pay

## 2023-05-26 ENCOUNTER — Inpatient Hospital Stay: Payer: Self-pay | Admitting: Internal Medicine

## 2023-05-27 ENCOUNTER — Ambulatory Visit (INDEPENDENT_AMBULATORY_CARE_PROVIDER_SITE_OTHER): Payer: Medicare PPO | Admitting: Internal Medicine

## 2023-05-27 ENCOUNTER — Inpatient Hospital Stay: Payer: Medicare PPO | Admitting: Internal Medicine

## 2023-05-27 ENCOUNTER — Ambulatory Visit (HOSPITAL_COMMUNITY)
Admission: RE | Admit: 2023-05-27 | Discharge: 2023-05-27 | Disposition: A | Discharge status: Home / Self Care | Payer: Medicare PPO | Source: Ambulatory Visit | Attending: Internal Medicine | Admitting: Internal Medicine

## 2023-05-27 ENCOUNTER — Encounter: Payer: Self-pay | Admitting: Internal Medicine

## 2023-05-27 ENCOUNTER — Other Ambulatory Visit (HOSPITAL_COMMUNITY): Payer: Self-pay

## 2023-05-27 ENCOUNTER — Other Ambulatory Visit: Payer: Self-pay

## 2023-05-27 VITALS — BP 136/80 | HR 96 | Temp 98.0°F | Resp 16

## 2023-05-27 DIAGNOSIS — A0472 Enterocolitis due to Clostridium difficile, not specified as recurrent: Secondary | ICD-10-CM

## 2023-05-27 DIAGNOSIS — J9611 Chronic respiratory failure with hypoxia: Secondary | ICD-10-CM | POA: Diagnosis not present

## 2023-05-27 MED ORDER — SODIUM CHLORIDE 0.9 % IV SOLN
10.0000 mg/kg | Freq: Once | INTRAVENOUS | Status: AC
  Administered 2023-05-27: 544 mg via INTRAVENOUS
  Filled 2023-05-27: qty 21.76

## 2023-05-27 NOTE — Progress Notes (Signed)
Patient: Jodi Valdez  DOB: [birth year] MRN: [id number] PCP: Ailene Ravel, MD      Patient Active Problem List   Diagnosis Date Noted   Imaging of gastrointestinal tract abnormal 04/29/2023   Recurrent colitis due to Clostridioides difficile 04/29/2023   Recurrent Clostridioides difficile diarrhea 04/28/2023   Diarrhea, infectious, adult 04/28/2023   Abnormal CT scan, gastrointestinal tract 04/28/2023   Pancreatic insufficiency 04/27/2023   Pancreatic cyst 04/27/2023   Insomnia 04/27/2023   Hashimoto's thyroiditis 04/27/2023   SIRS (systemic inflammatory response syndrome) (HCC) 04/26/2023   Hypertensive crisis 04/26/2023   Hyponatremia 04/26/2023   Hypokalemia 04/26/2023   Hypomagnesemia 04/26/2023   Proctocolitis 04/26/2023   Diarrhea 04/26/2023   Fall 04/26/2023   Acute urinary retention 02/25/2023   Generalized weakness 02/25/2023   Concern for C. difficile colitis 02/24/2023   Cardiomyopathy (HCC) ejection fraction 40 to 45% in October 2022 based on echo 08/08/2021   Polymyalgia (HCC) 08/06/2021   Personal history of radiation therapy 08/06/2021   Personal history of chemotherapy 08/06/2021   Hyperlipidemia 08/06/2021   Breast cancer (HCC) 08/06/2021   Allergy 08/06/2021   Internal hemorrhoids with complication 04/14/2021   Hematochezia 06/17/2020   Heme positive stool 06/04/2020   Anemia 06/04/2020   Gastroesophageal reflux disease 06/04/2020   Abdominal pain, epigastric 06/04/2020   NSAID long-term use 06/04/2020   Swelling of both lower extremities 05/23/2020   COVID-19 long hauler manifesting chronic dyspnea 02/21/2018   Abdominal pain, lower 10/07/2014   Abnormal findings on radiological examination of gastrointestinal tract 07/17/2014   ABDOMINAL PAIN -GENERALIZED 12/31/2009   HYPERTHYROIDISM 12/09/2009   Essential hypertension 12/09/2009   Constipation 12/09/2009   ABDOMINAL PAIN RIGHT LOWER QUADRANT 12/09/2009   ABDOMINAL PAIN, LEFT  LOWER QUADRANT 12/09/2009   PERSONAL HX BREAST CANCER 12/09/2009   Cancer (HCC) 11/01/2000     Subjective:  Jodi Valdez is a 87 year old female with history of pulm nodule upon exam, Hashimoto's thyroiditis, recurrent C. difficile relapsed and completed a taper of vancomycin x 6 weeks presents to hospital management for recurrent C. difficile colitis.  She has had a history of recurrent C. difficile with testing history as below: 01/06/2023 C. difficile testing positive treated with vancomycin x 10 days - Admitted 6/5-2 6/7 for C. difficile colitis, toxin and antigen positive initially did not improve Dificid.  Treated with vancomycin for 6-week treatment. - C. difficile testing including antigen/toxin /PCR negative on 7/29 started for.  Started on cholestyramine by GI. She then developed large-volume loose stools, C. difficile toxin, antigen positive on 8//24. - Dificid did not improve symptoms.  CT abdomen pelvis on 04/26/23 showed infectious or Infyna proctocolitis with ulcer colitis/pseudomembranous colitis no evidence of obstruction or perforation.  She was discharged on vancomycin  x10 days with a taper 200 mg every other day x 20 days.  Zinplava infusion on 9/5.  Presents to clinic for vowst.  Noted that she ultimately will require contrast down the road Today 05/27/23:Pt reports her stools became formes after she was discharged home Review of Systems  All other systems reviewed and are negative.   Past Medical History:  Diagnosis Date   Allergy    Anemia    Asthma    Breast cancer (HCC)    Cancer (HCC) 11/01/2000   left breast cancer   GERD (gastroesophageal reflux disease)    Heart disease    Hyperlipidemia    Hypertension    Long COVID    Personal history of chemotherapy  2002   Personal history of radiation therapy    2002   Polymyalgia [facility])     Outpatient Medications Prior to Visit  Medication Sig Dispense Refill   Acetylcysteine (NAC PO) Take 1 capsule by  mouth daily.     albuterol (VENTOLIN HFA) 108 (90 Base) MCG/ACT inhaler Inhale 2 puffs into the lungs every 6 (six) hours as needed for wheezing or shortness of breath. 8 g 2   alendronate (FOSAMAX) 70 MG tablet Take 70 mg by mouth every Monday.     amLODipine (NORVASC) 5 MG tablet Take 5 mg by mouth 2 (two) times daily.     ascorbic acid (VITAMIN C) 1000 MG tablet Take 1,000 mg by mouth daily.     budesonide (PULMICORT) 0.5 MG/2ML nebulizer solution Use one vial in nebulizer twice daily as directed. (Patient taking differently: Take 0.5 mg by nebulization 2 (two) times daily as needed (for flares).) 120 mL 5   carvedilol (COREG) 6.25 MG tablet Take 6.25 mg by mouth 2 (two) times daily with a meal.     Cholecalciferol (VITAMIN D-3) 125 MCG (5000 UT) TABS Take 5,000 Units by mouth 2 (two) times daily.     cholestyramine (QUESTRAN) 4 g packet Take 1 packet (4 g total) by mouth daily. 30 packet 6   EPINEPHrine 0.3 mg/0.3 mL IJ SOAJ injection Use as directed for life-threatening allergic reaction. (Patient taking differently: Inject 0.3 mg into the muscle once as needed (as directed for a life-threatening allergic reaction).) 2 Device 3   fidaxomicin (DIFICID) 200 MG TABS tablet Take 1 tablet (200 mg total) by mouth 2 (two) times daily for 5 days, THEN 1 tablet (200 mg total) every other day for 20 days. 20 tablet 2   furosemide (LASIX) 40 MG tablet TAKE 1 TABLET BY MOUTH EVERY DAY (Patient not taking: Reported on 04/27/2023) 90 tablet 1   isosorbide mononitrate (IMDUR) 30 MG 24 hr tablet Take 1 tablet (30 mg total) by mouth daily. 90 tablet 3   levothyroxine (SYNTHROID) 88 MCG tablet Take 1 tablet (88 mcg total) by mouth daily. (Patient taking differently: Take 88 mcg by mouth daily before breakfast.) 90 tablet 3   losartan (COZAAR) 50 MG tablet Take 50 mg by mouth daily.     Multiple Vitamins-Minerals (ONE DAILY MULTIVIT-MIN ADULT PO) Take 1 tablet by mouth daily.     Multiple Vitamins-Minerals  (PRESERVISION/LUTEIN PO) Take 1 capsule by mouth 2 (two) times daily. Unknown strength     nitroGLYCERIN (NITROSTAT) 0.4 MG SL tablet PLACE 1 TABLET UNDER THE TONGUE EVERY 5 MINUTES AS NEEDED FOR CHEST PAIN. MAX 3 DOSES IN 15 MINUTES. IF STILL PAIN CALL 911. (Patient taking differently: Place 0.4 mg under the tongue every 5 (five) minutes as needed for chest pain.) 25 tablet 3   OXYGEN Inhale 5 L/min into the lungs as needed (for shortness of breath).     pantoprazole (PROTONIX) 40 MG tablet Take one tablet by mouth twice daily as directed. (Patient taking differently: Take 40 mg by mouth 2 (two) times daily before a meal.) 180 tablet 1   potassium chloride SA (KLOR-CON M) 20 MEQ tablet Take 1 tablet (20 mEq total) by mouth daily. (Patient not taking: Reported on 04/27/2023) 90 tablet 3   pravastatin (PRAVACHOL) 20 MG tablet Take 20 mg by mouth daily.     predniSONE (DELTASONE) 1 MG tablet Take 3 mg by mouth daily.     predniSONE (DELTASONE) 5 MG tablet Take 5 mg by  mouth daily.     Spacer/Aero-Holding Rudean Curt As directed (Patient taking differently: 1 each by Other route See admin instructions. As directed) 1 each 0   traZODone (DESYREL) 100 MG tablet Take 150 mg by mouth at bedtime.     No facility-administered medications prior to visit.     Allergies  Allergen Reactions   Propranolol Other (See Comments)    Hallucinations    Gabapentin Other (See Comments)    Hallucinations   Metformin And Related Diarrhea   Codeine Nausea And Vomiting   Nitrofuran Derivatives Nausea And Vomiting and Other (See Comments)    GI Intolerance    Social History   Tobacco Use   Smoking status: Never   Smokeless tobacco: Never  Vaping Use   Vaping status: Never Used  Substance Use Topics   Alcohol use: No    Alcohol/week: 0.0 standard drinks of alcohol   Drug use: No    Family History  Problem Relation Age of Onset   Colon cancer Neg Hx    Colon polyps Neg Hx    Diabetes Neg Hx    Kidney  disease Neg Hx    Esophageal cancer Neg Hx     Objective:  There were no vitals filed for this visit. There is no height or weight on file to calculate BMI.  Physical Exam Constitutional:      Appearance: Normal appearance.  HENT:     Head: Normocephalic and atraumatic.     Right Ear: Tympanic membrane normal.     Left Ear: Tympanic membrane normal.     Nose: Nose normal.     Mouth/Throat:     Mouth: Mucous membranes are moist.  Eyes:     Extraocular Movements: Extraocular movements intact.     Conjunctiva/sclera: Conjunctivae normal.     Pupils: Pupils are equal, round, and reactive to light.  Cardiovascular:     Rate and Rhythm: Normal rate and regular rhythm.     Heart sounds: No murmur heard.    No friction rub. No gallop.  Pulmonary:     Effort: Pulmonary effort is normal.     Breath sounds: Normal breath sounds.  Abdominal:     General: Abdomen is flat.     Palpations: Abdomen is soft.  Musculoskeletal:        General: Normal range of motion.  Skin:    General: Skin is warm and dry.  Neurological:     General: No focal deficit present.     Mental Status: She is alert and oriented to person, place, and time.  Psychiatric:        Mood and Affect: Mood normal.     Lab Results: Lab Results  Component Value Date   WBC 6.4 04/29/2023   HGB 10.1 (L) 04/29/2023   HCT 30.5 (L) 04/29/2023   MCV 96.2 04/29/2023   PLT 170 04/29/2023    Lab Results  Component Value Date   CREATININE 0.71 04/29/2023   BUN 9 04/29/2023   NA 137 04/29/2023   K 4.9 04/29/2023   CL 110 04/29/2023   CO2 22 04/29/2023    Lab Results  Component Value Date   ALT 17 04/29/2023   AST 11 (L) 04/29/2023   ALKPHOS 32 (L) 04/29/2023   BILITOT 0.7 04/29/2023     Assessment & Plan:  #Recurrent C. difficile colitis -Patient treated with several courses of vancomycin in the past.  Last vancomycin taper was x 6 weeks - Recently hospitalized with acute  colitis.  Toxin and antigen  positive.  Treated with vancomycin x 10 days with 20-day of Dificid taper.  Zinplava on 9/5 - We do not have Vowst today. Her stools have been formed since she returned to the hospital. Plan: -Stop dificid -If diarrhea returns will send for stool testing, plan on vanc x 10d->dificid 20 day taper followed by vowst. Will do PA for Vowst at time of positive C.diff test.  -F/U with ID PRN  Danelle Earthly, MD [facility]   05/27/23  12:17 PM   I have personally spent 45 minutes involved in face-to-face and non-face-to-face activities for this patient on the day of the visit. Professional time spent includes the following activities: Preparing to see the patient (review of tests), Obtaining and/or reviewing separately obtained history (admission/discharge record), Performing a medically appropriate examination and/or evaluation , Ordering medications/tests/procedures, referring and communicating with other health care professionals, Documenting clinical information in the EMR, Independently interpreting results (not separately reported), Communicating results to the patient/family/caregiver, Counseling and educating the patient/family/caregiver and Care coordination (not separately reported).

## 2023-05-28 ENCOUNTER — Telehealth: Payer: Self-pay | Admitting: Gastroenterology

## 2023-05-28 NOTE — Telephone Encounter (Signed)
Husband called to clarify if patient needed to continue dificid taper that runs out on 06/05/23. Per ID note yesterday, patient was suppose to stop dificid since she received infusion of Zinplava. Advised husband that since ID is handling patient's treatment for the cdiff, that he should reach out to Dr. Doristine Church office to clarify medication & continue with follow up on Monday with Dr. Chales Abrahams. Pt's husband verbalized all understanding.

## 2023-05-28 NOTE — Telephone Encounter (Signed)
Left message for patient to call back  

## 2023-05-28 NOTE — Telephone Encounter (Signed)
Inbound call from patient's husband requesting a call back today to be advised on patient's Desyrel medication. Patient's husband stated he was instructed by the ED provider for patient to stop taking medication. Stated patient is due to take another dose tonight and is needing to know if patient needs to take medication. Patient's husband requesting a call back to discuss further and to be advised.

## 2023-05-31 ENCOUNTER — Ambulatory Visit: Payer: Medicare PPO | Admitting: Gastroenterology

## 2023-05-31 ENCOUNTER — Encounter: Payer: Self-pay | Admitting: Gastroenterology

## 2023-05-31 VITALS — BP 122/80 | HR 86 | Ht 64.0 in | Wt 127.0 lb

## 2023-05-31 DIAGNOSIS — A0471 Enterocolitis due to Clostridium difficile, recurrent: Secondary | ICD-10-CM

## 2023-05-31 NOTE — Progress Notes (Signed)
Chief Complaint: FU  Referring Provider:  Lonie Peak, PA-C      ASSESSMENT AND PLAN;   #1.  Recurrent C Diff colitis (x 3). Neg colon 07/2014. #2.  IPMN, uncinate process without any worrisome factors/no ductal dilatation. (CT 11/2017, MRI 09/2014, CT 06/2014). PET 07/2021 -measuring 2.4 x 1.9 cm. No corresponding FDG uptake identified within this lesion. On 12/09/2017 this measured 1.2 by 1.1 cm.      Plan: -Avoid all antibiotics -Trial of Rebyota home program -Continue to mointor for now -If any recurrence or diarrhea, per ID- send for stool testing, plan on vanc x 10d->dificid 20 day taper followed by vowst. -FU in 6 months     HPI:    Jodi Valdez is a 87 y.o. female  Accompanied by her husband H/O breast cancer s/p chemo/radiation/mastectomy 2022, HTN, HLD, grade 1 on diastolic heart failure with preserved EF 60-65%, Hashimoto's thyroiditis, COVID long-haul her since 2022-currently as needed DuoNeb and oxygen, polymyalgia rheumatica and multiple others as below  For FU from recent hospitalization with recurrent C diff colitis-third episode.  Treated with vancomycin followed by Dificid as below.  Also given Zinplava.  She feels much better.  No further diarrhea.  Having "solid turds" 2 BMs per day without nocturnal symptoms.  No abdominal pain.  Has been eating well.  Has gained weight.  No other recent antibiotics.    From previous records:  H/O recurrent C. difficile colitis - January 06, 2023: + Cdiff with diarrhea.  Treated with vancomycin x 10 days.  - Admitted 6/5-6/7 for C. difficile colitis, toxin and antigen positive initially did not improve Dificid.  Treated with pulsed vancomycin x 6 weeks - Then recurrent diarrhea 04/19/2023 - Neg C. difficile testing including antigen/toxin /PCR neg. failed trial of cholestyramine.  Had increasing diarrhea.  C. difficile toxin/antigen +. Adm 8/5-8/06/2023 with CT showing proctosigmoiditis.  Did not improve  with Dificid initially.  Treated with vancomycin for 14 days followed by Dificid taper 200 mg every other day x 20 days.  Zinplava infusion on 9/5. Seen by ID. No VOWST samples.  Plan as above for any further recurrence.  Denies having any jaundice dark urine or pale stools.  No recent weight loss.  Has been eating much better.  Has gained weight.  Wt Readings from Last 3 Encounters:  05/31/23 127 lb (57.6 kg)  05/27/23 120 lb (54.4 kg)  05/01/23 117 lb 15.1 oz (53.5 kg)       Past GI workup:  CT AP/chest with contrast 04/26/2023 1. No definite evidence of acute pulmonary embolism. Multiple thin webs within the right lower lobar pulmonary artery in keeping with the sequela of chronic pulmonary embolus. 2. Moderate multi-vessel coronary artery calcification. 3. Trace bilateral pleural effusions. 4. Interval development of a remote appearing superior endplate fracture of T6 with approximately 50% loss of height. 5. Stable cystic lesion within the uncinate process of the pancreas measuring up to 3.7 cm in greatest dimension and, as noted on prior examination, demonstrates slow interval growth since remote prior examination of 12/09/2017 where this measured 11 mm in greatest dimension. Given its slow interval progression, a benign etiology such as a side branch IPMN is considered most likely. 6. Extensive aortoiliac atherosclerotic calcification with probable hemodynamically significant stenoses of the celiac axis, proximal superior and inferior mesenteric arteries, and renal arteries bilaterally. Calcified plaque within the infrarenal abdominal aorta may result in hemodynamically significant stenosis in this location as well. Clinical correlation for signs  and symptoms of chronic mesenteric ischemia or hemodynamically significant renal artery stenosis may be helpful. 7. Infectious or inflammatory proctocolitis as can be seen with ulcerative colitis or pseudomembranous colitis. No  evidence of obstruction or perforation. 8.  Aortic Atherosclerosis (ICD10-I70.0).   -Neg colon 2015 Dr Marina Goodell -reviewed with the patient and patient's husband. panc cyst stable since 2015 -she would like to hold off on any further evaluation.  Had multiple MRIs previously.  No ductal dilatation  08/18/2021 PET scan 1. No signs of FDG avid bone metastases. No suspicious mass or adenopathy identified to suggest nodal or solid organ metastasis. 2. Asymmetric increased uptake within the right lobe of thyroid gland without corresponding underlying nodule is identified. Findings may be seen in the setting of thyroiditis. Correlation with thyroid function tests may be helpful. 3. Benign appearing, non FDG avid, cystic lesion is again noted within the uncinate process of pancreas. Increased in size from 12/09/2017. According to consensus criteria as this remains less than 2.5 cm follow-up imaging in 12 months with pancreas protocol MRI or CT is recommended. Please note: The decision to pursue imaging follow-up should be anchored to a patient's overall health and preferences; such workup is only advised if the patient is a surgical candidate. 4. Aortic Atherosclerosis (ICD10-I70.0). Coronary artery calcifications.  Wt Readings from Last 3 Encounters:  05/31/23 127 lb (57.6 kg)  05/27/23 120 lb (54.4 kg)  05/01/23 117 lb 15.1 oz (53.5 kg)      Past Medical History:  Diagnosis Date   Allergy    Asthma    Cancer (HCC) 11/01/2000   left breast cancer   GERD (gastroesophageal reflux disease)    Hypertension    Personal history of chemotherapy    2002   Personal history of radiation therapy    2002   Polymyalgia [facility])     Past Surgical History:  Procedure Laterality Date   ABDOMINAL HYSTERECTOMY  1975   BREAST SURGERY Left 11/01/2000   masectomy   COLONOSCOPY  2015   CYSTOCELE REPAIR  09/1999   MASTECTOMY Left    2002 with tramflap   RECTOCELE REPAIR  09/1999    Family History   Problem Relation Age of Onset   Colon cancer Neg Hx    Colon polyps Neg Hx    Diabetes Neg Hx    Kidney disease Neg Hx    Esophageal cancer Neg Hx     Social History   Tobacco Use   Smoking status: Never Smoker   Smokeless tobacco: Never Used  Substance Use Topics   Alcohol use: No    Alcohol/week: 0.0 standard drinks   Drug use: No    Current Outpatient Medications  Medication Sig Dispense Refill   alendronate (FOSAMAX) 70 MG tablet Take 70 mg by mouth once a week.     amitriptyline (ELAVIL) 10 MG tablet Take 10 mg by mouth at bedtime.      benazepril (LOTENSIN) 40 MG tablet Take 40 mg by mouth daily.     carvedilol (COREG) 3.125 MG tablet Take 3.125 mg by mouth 2 (two) times daily with a meal.      Cholecalciferol (VITAMIN D3) 1000 UNITS CAPS Take 1,000 Units by mouth 2 (two) times daily.     EPINEPHrine 0.3 mg/0.3 mL IJ SOAJ injection Use as directed for life-threatening allergic reaction. 2 Device 3   hydrochlorothiazide (HYDRODIURIL) 25 MG tablet Take 25 mg by mouth daily.      levothyroxine (SYNTHROID, LEVOTHROID) 125 MCG tablet Take  62.5 mcg by mouth daily before breakfast.      Multiple Vitamins-Minerals (PRESERVISION/LUTEIN PO) Take 1 tablet by mouth 2 (two) times daily.       Omega-3 Fatty Acids (FISH OIL) 1000 MG CAPS Take 2 capsules by mouth daily.      pravastatin (PRAVACHOL) 20 MG tablet Take 20 mg by mouth at bedtime.     predniSONE (DELTASONE) 5 MG tablet Take 8 mg by mouth daily. 8 mg total dose  0   No current facility-administered medications for this visit.     Allergies  Allergen Reactions   Propranolol Other (See Comments)    Other reaction(s): Other (See Comments) hallucinations   Codeine Nausea And Vomiting   Nitrofuran Derivatives Nausea And Vomiting    Review of Systems:  Negative except for HPI     Physical Exam:    BP 128/70   Pulse 91   Ht 5\' 4"  (1.626 m)   Wt 129 lb (58.5 kg)   BMI 22.14 kg/m  Filed Weights   06/29/18 1433   Weight: 129 lb (58.5 kg)   Constitutional:  Well-developed, in no acute distress. Psychiatric: Normal mood and affect. Behavior is normal. HEENT: Pupils normal.  Conjunctivae are normal. No scleral icterus. Cardiovascular: Normal rate, regular rhythm. No edema Pulmonary/chest: Effort normal and breath sounds normal. No wheezing, rales or rhonchi. Abdominal: Soft, nondistended. Nontender. Bowel sounds active throughout. There are no masses palpable. No hepatomegaly. Rectal:  defered Neurological: Alert and oriented to person place and time. Skin: Skin is warm and dry. No rashes noted.  Data Reviewed: I have personally reviewed following labs and imaging studies  CBC: CBC Latest Ref Rng & Units 08/07/2014 08/11/2008  WBC 4.0 - 10.5 K/uL 8.8 7.4  Hemoglobin 12.0 - 15.0 g/dL 18.8 41.6  Hematocrit 60.6 - 46.0 % 41.7 35.8(L)  Platelets 150 - 400 K/uL 279 215    CMP: CMP Latest Ref Rng & Units 10/19/2014 08/07/2014 08/11/2008  Glucose 70 - 99 mg/dL - 301(S) 95  BUN 6 - 23 mg/dL - 17 18  Creatinine 0.10 - 1.10 mg/dL 9.32 3.55 7.32  Sodium 137 - 147 mEq/L - 139 140  Potassium 3.7 - 5.3 mEq/L - 3.8 4.4  Chloride 96 - 112 mEq/L - 95(L) 108  CO2 19 - 32 mEq/L - 31 27  Calcium 8.4 - 10.5 mg/dL - 9.9 9.1  Total Protein 6.0 - 8.3 g/dL - 7.1 -  Total Bilirubin 0.3 - 1.2 mg/dL - 0.4 -  Alkaline Phos 39 - 117 U/L - 48 -  AST 0 - 37 U/L - 16 -  ALT 0 - 35 U/L - 25 -      Edman Circle, MD 06/29/2018, 2:51 PM  Cc: Lonie Peak, PA-C

## 2023-05-31 NOTE — Patient Instructions (Signed)
_______________________________________________________  If your blood pressure at your visit was 140/90 or greater, please contact your primary care physician to follow up on this.  _______________________________________________________  If you are age 85 or older, your body mass index should be between 23-30. Your Body mass index is 21.8 kg/m. If this is out of the aforementioned range listed, please consider follow up with your Primary Care Provider.  If you are age 72 or younger, your body mass index should be between 19-25. Your Body mass index is 21.8 kg/m. If this is out of the aformentioned range listed, please consider follow up with your Primary Care Provider.   ________________________________________________________  The [facility] providers would like to encourage you to use [facility] to communicate with providers for non-urgent requests or questions.  Due to long hold times on the telephone, sending your provider a message by [facility] may be a faster and more efficient way to get a response.  Please allow 48 business hours for a response.  Please remember that this is for non-urgent requests.  _______________________________________________________  We will be in touch sooner in regards to Rebyota.

## 2023-06-01 ENCOUNTER — Other Ambulatory Visit (HOSPITAL_COMMUNITY): Payer: Self-pay

## 2023-06-01 ENCOUNTER — Telehealth: Payer: Self-pay

## 2023-06-01 NOTE — Telephone Encounter (Signed)
Rebyota application has been submitted and I have received a fax response that they have received documentation. Awaiting further instructions.

## 2023-06-04 ENCOUNTER — Other Ambulatory Visit: Payer: Self-pay

## 2023-06-04 MED ORDER — VANCOMYCIN HCL 125 MG PO CAPS: 125.0000 mg | ORAL_CAPSULE | Freq: Four times a day (QID) | ORAL | 0 refills | Status: AC

## 2023-06-04 NOTE — Telephone Encounter (Signed)
Everything has been 100 covered for the Rebyota. Pt. has a copay for the epi-pen and the home nurse visit.  Jodi Valdez has to do another treatment course of Vancomycin for the Rebyota to be given, otherwise it would be given off label. The protocol is 24-72 hours after Vancomycin. Spoke with Dr Chales Abrahams and he gave the order to proceed.  Rx has been sent to [facility] in [place].  All communication has been discussed with husband Jodi Valdez. He agrees to the plan and will start her on vancomycin tomorrow.  [name] from [facility] pharmacy is aware and will work on getting her scheduled for in home Rebyota treatment.

## 2023-06-14 ENCOUNTER — Other Ambulatory Visit (HOSPITAL_COMMUNITY): Payer: Self-pay

## 2023-06-17 DIAGNOSIS — J449 Chronic obstructive pulmonary disease, unspecified: Secondary | ICD-10-CM | POA: Diagnosis not present

## 2023-06-17 DIAGNOSIS — A0471 Enterocolitis due to Clostridium difficile, recurrent: Secondary | ICD-10-CM | POA: Diagnosis not present

## 2023-06-24 NOTE — Telephone Encounter (Signed)
I spoke to the [facility] to follow up on the Rebyota treatment. They indicate everything went very smoothly and happy with the services received. Advised to reach out if any additional symptoms reoccur.

## 2023-06-28 NOTE — Telephone Encounter (Signed)
Awesome, Thx RG

## 2023-07-13 DIAGNOSIS — T380X5A Adverse effect of glucocorticoids and synthetic analogues, initial encounter: Secondary | ICD-10-CM | POA: Diagnosis not present

## 2023-07-13 DIAGNOSIS — R300 Dysuria: Secondary | ICD-10-CM | POA: Diagnosis not present

## 2023-07-13 DIAGNOSIS — N1831 Chronic kidney disease, stage 3a: Secondary | ICD-10-CM | POA: Diagnosis not present

## 2023-07-13 DIAGNOSIS — E039 Hypothyroidism, unspecified: Secondary | ICD-10-CM | POA: Diagnosis not present

## 2023-07-13 DIAGNOSIS — I5020 Unspecified systolic (congestive) heart failure: Secondary | ICD-10-CM | POA: Diagnosis not present

## 2023-07-13 DIAGNOSIS — E559 Vitamin D deficiency, unspecified: Secondary | ICD-10-CM | POA: Diagnosis not present

## 2023-07-13 DIAGNOSIS — I10 Essential (primary) hypertension: Secondary | ICD-10-CM | POA: Diagnosis not present

## 2023-07-13 DIAGNOSIS — F330 Major depressive disorder, recurrent, mild: Secondary | ICD-10-CM | POA: Diagnosis not present

## 2023-07-13 DIAGNOSIS — Z23 Encounter for immunization: Secondary | ICD-10-CM | POA: Diagnosis not present

## 2023-07-13 DIAGNOSIS — E099 Drug or chemical induced diabetes mellitus without complications: Secondary | ICD-10-CM | POA: Diagnosis not present

## 2023-07-17 DIAGNOSIS — J449 Chronic obstructive pulmonary disease, unspecified: Secondary | ICD-10-CM | POA: Diagnosis not present

## 2023-07-24 DIAGNOSIS — R079 Chest pain, unspecified: Secondary | ICD-10-CM | POA: Diagnosis not present

## 2023-07-24 DIAGNOSIS — R457 State of emotional shock and stress, unspecified: Secondary | ICD-10-CM | POA: Diagnosis not present

## 2023-07-24 DIAGNOSIS — R001 Bradycardia, unspecified: Secondary | ICD-10-CM | POA: Diagnosis not present

## 2023-07-24 DIAGNOSIS — K219 Gastro-esophageal reflux disease without esophagitis: Secondary | ICD-10-CM | POA: Diagnosis not present

## 2023-07-24 DIAGNOSIS — Z7982 Long term (current) use of aspirin: Secondary | ICD-10-CM | POA: Diagnosis not present

## 2023-07-24 DIAGNOSIS — R55 Syncope and collapse: Secondary | ICD-10-CM | POA: Diagnosis not present

## 2023-07-24 DIAGNOSIS — I469 Cardiac arrest, cause unspecified: Secondary | ICD-10-CM | POA: Diagnosis not present

## 2023-07-24 DIAGNOSIS — Z79899 Other long term (current) drug therapy: Secondary | ICD-10-CM | POA: Diagnosis not present

## 2023-07-24 DIAGNOSIS — Z7984 Long term (current) use of oral hypoglycemic drugs: Secondary | ICD-10-CM | POA: Diagnosis not present

## 2023-07-24 DIAGNOSIS — I10 Essential (primary) hypertension: Secondary | ICD-10-CM | POA: Diagnosis not present

## 2023-07-24 DIAGNOSIS — E7800 Pure hypercholesterolemia, unspecified: Secondary | ICD-10-CM | POA: Diagnosis not present

## 2023-07-24 DIAGNOSIS — E119 Type 2 diabetes mellitus without complications: Secondary | ICD-10-CM | POA: Diagnosis not present

## 2023-07-24 DIAGNOSIS — R0602 Shortness of breath: Secondary | ICD-10-CM | POA: Diagnosis not present

## 2023-07-24 DIAGNOSIS — I429 Cardiomyopathy, unspecified: Secondary | ICD-10-CM | POA: Diagnosis not present

## 2023-07-28 ENCOUNTER — Other Ambulatory Visit: Payer: Self-pay | Admitting: Cardiology

## 2023-08-12 NOTE — Telephone Encounter (Signed)
Telephone call  

## 2023-08-22 DEATH — deceased

## 2023-09-21 ENCOUNTER — Other Ambulatory Visit (HOSPITAL_COMMUNITY): Payer: Self-pay

## 2023-10-12 ENCOUNTER — Other Ambulatory Visit (HOSPITAL_COMMUNITY): Payer: Self-pay
